# Patient Record
Sex: Female | Born: 1950
Health system: Southern US, Community
[De-identification: ages and names within clinical notes are randomized; demographics above are authoritative.]

## PROBLEM LIST (undated history)

## (undated) DIAGNOSIS — G500 Trigeminal neuralgia: Secondary | ICD-10-CM

## (undated) DIAGNOSIS — G35 Multiple sclerosis: Secondary | ICD-10-CM

## (undated) DIAGNOSIS — I10 Essential (primary) hypertension: Secondary | ICD-10-CM

## (undated) DIAGNOSIS — E079 Disorder of thyroid, unspecified: Secondary | ICD-10-CM

## (undated) DIAGNOSIS — M8580 Other specified disorders of bone density and structure, unspecified site: Secondary | ICD-10-CM

## (undated) HISTORY — DX: Other specified disorders of bone density and structure, unspecified site: M85.80

## (undated) HISTORY — DX: Disorder of thyroid, unspecified: E07.9

## (undated) HISTORY — DX: Essential (primary) hypertension: I10

## (undated) HISTORY — DX: Multiple sclerosis: G35

## (undated) HISTORY — PX: OTHER SURGICAL HISTORY: SHX169

## (undated) HISTORY — DX: Trigeminal neuralgia: G50.0

---

## 1986-10-06 HISTORY — PX: OTHER SURGICAL HISTORY: SHX169

## 1988-10-06 HISTORY — PX: OTHER SURGICAL HISTORY: SHX169

## 1997-10-06 HISTORY — PX: ELBOW SURGERY: SHX618

## 1998-10-02 ENCOUNTER — Inpatient Hospital Stay (HOSPITAL_COMMUNITY): Admission: EM | Admit: 1998-10-02 | Discharge: 1998-10-08 | Payer: Self-pay | Admitting: Orthopedic Surgery

## 1998-10-02 ENCOUNTER — Encounter: Payer: Self-pay | Admitting: Orthopedic Surgery

## 1998-10-03 ENCOUNTER — Encounter: Payer: Self-pay | Admitting: Orthopedic Surgery

## 1998-10-04 ENCOUNTER — Encounter: Payer: Self-pay | Admitting: Orthopedic Surgery

## 1998-10-08 ENCOUNTER — Inpatient Hospital Stay (HOSPITAL_COMMUNITY)
Admission: RE | Admit: 1998-10-08 | Discharge: 1998-10-12 | Payer: Self-pay | Admitting: Physical Medicine and Rehabilitation

## 1999-05-24 ENCOUNTER — Other Ambulatory Visit: Admission: RE | Admit: 1999-05-24 | Discharge: 1999-05-24 | Payer: Self-pay | Admitting: Obstetrics and Gynecology

## 2000-05-26 ENCOUNTER — Other Ambulatory Visit: Admission: RE | Admit: 2000-05-26 | Discharge: 2000-05-26 | Payer: Self-pay | Admitting: Obstetrics and Gynecology

## 2001-03-05 ENCOUNTER — Observation Stay (HOSPITAL_COMMUNITY): Admission: RE | Admit: 2001-03-05 | Discharge: 2001-03-05 | Payer: Self-pay | Admitting: Neurosurgery

## 2001-03-05 ENCOUNTER — Encounter: Payer: Self-pay | Admitting: Neurosurgery

## 2001-05-17 ENCOUNTER — Other Ambulatory Visit: Admission: RE | Admit: 2001-05-17 | Discharge: 2001-05-17 | Payer: Self-pay | Admitting: Obstetrics and Gynecology

## 2002-06-21 ENCOUNTER — Other Ambulatory Visit: Admission: RE | Admit: 2002-06-21 | Discharge: 2002-06-21 | Payer: Self-pay | Admitting: *Deleted

## 2003-06-08 ENCOUNTER — Observation Stay (HOSPITAL_COMMUNITY): Admission: EM | Admit: 2003-06-08 | Discharge: 2003-06-09 | Payer: Self-pay | Admitting: Neurology

## 2003-06-09 ENCOUNTER — Encounter: Payer: Self-pay | Admitting: Pediatrics

## 2003-07-11 ENCOUNTER — Other Ambulatory Visit: Admission: RE | Admit: 2003-07-11 | Discharge: 2003-07-11 | Payer: Self-pay | Admitting: *Deleted

## 2003-10-07 HISTORY — PX: OTHER SURGICAL HISTORY: SHX169

## 2004-02-13 ENCOUNTER — Inpatient Hospital Stay (HOSPITAL_COMMUNITY): Admission: EM | Admit: 2004-02-13 | Discharge: 2004-02-15 | Payer: Self-pay | Admitting: Emergency Medicine

## 2004-02-19 ENCOUNTER — Encounter: Admission: RE | Admit: 2004-02-19 | Discharge: 2004-02-19 | Payer: Self-pay | Admitting: Family Medicine

## 2005-02-10 ENCOUNTER — Emergency Department (HOSPITAL_COMMUNITY): Admission: EM | Admit: 2005-02-10 | Discharge: 2005-02-10 | Payer: Self-pay | Admitting: Emergency Medicine

## 2005-02-18 ENCOUNTER — Emergency Department (HOSPITAL_COMMUNITY): Admission: EM | Admit: 2005-02-18 | Discharge: 2005-02-18 | Payer: Self-pay | Admitting: Family Medicine

## 2005-08-13 ENCOUNTER — Ambulatory Visit: Payer: Self-pay | Admitting: Gastroenterology

## 2005-08-22 ENCOUNTER — Ambulatory Visit: Payer: Self-pay | Admitting: Gastroenterology

## 2005-10-06 ENCOUNTER — Emergency Department (HOSPITAL_COMMUNITY): Admission: EM | Admit: 2005-10-06 | Discharge: 2005-10-06 | Payer: Self-pay | Admitting: Emergency Medicine

## 2006-03-17 ENCOUNTER — Other Ambulatory Visit: Admission: RE | Admit: 2006-03-17 | Discharge: 2006-03-17 | Payer: Self-pay | Admitting: Gynecology

## 2007-03-19 ENCOUNTER — Other Ambulatory Visit: Admission: RE | Admit: 2007-03-19 | Discharge: 2007-03-19 | Payer: Self-pay | Admitting: Gynecology

## 2008-04-03 ENCOUNTER — Other Ambulatory Visit: Admission: RE | Admit: 2008-04-03 | Discharge: 2008-04-03 | Payer: Self-pay | Admitting: Gynecology

## 2009-07-16 ENCOUNTER — Ambulatory Visit: Payer: Self-pay | Admitting: Gynecology

## 2009-07-16 ENCOUNTER — Encounter: Payer: Self-pay | Admitting: Gynecology

## 2009-07-16 ENCOUNTER — Other Ambulatory Visit: Admission: RE | Admit: 2009-07-16 | Discharge: 2009-07-16 | Payer: Self-pay | Admitting: Gynecology

## 2009-10-06 HISTORY — PX: BACK SURGERY: SHX140

## 2009-11-27 ENCOUNTER — Inpatient Hospital Stay (HOSPITAL_COMMUNITY): Admission: RE | Admit: 2009-11-27 | Discharge: 2009-12-02 | Payer: Self-pay | Admitting: Neurosurgery

## 2010-07-22 ENCOUNTER — Ambulatory Visit: Payer: Self-pay | Admitting: Gynecology

## 2010-07-22 ENCOUNTER — Other Ambulatory Visit: Admission: RE | Admit: 2010-07-22 | Discharge: 2010-07-22 | Payer: Self-pay | Admitting: Gynecology

## 2010-08-26 ENCOUNTER — Ambulatory Visit: Payer: Self-pay | Admitting: Gynecology

## 2010-09-26 ENCOUNTER — Ambulatory Visit: Payer: Self-pay | Admitting: Gynecology

## 2010-12-25 LAB — SURGICAL PCR SCREEN: Staphylococcus aureus: NEGATIVE

## 2010-12-25 LAB — TYPE AND SCREEN: ABO/RH(D): A NEG

## 2010-12-25 LAB — CBC
HCT: 34.8 % — ABNORMAL LOW (ref 36.0–46.0)
Hemoglobin: 12 g/dL (ref 12.0–15.0)
MCHC: 34.6 g/dL (ref 30.0–36.0)
RDW: 12.7 % (ref 11.5–15.5)

## 2010-12-25 LAB — CARBAMAZEPINE LEVEL, TOTAL
Carbamazepine Lvl: 12 ug/mL (ref 4.0–12.0)
Carbamazepine Lvl: 19.3 ug/mL (ref 4.0–12.0)

## 2010-12-25 LAB — BASIC METABOLIC PANEL
BUN: 16 mg/dL (ref 6–23)
CO2: 25 mEq/L (ref 19–32)
GFR calc non Af Amer: >60 mL/min (ref >60)
Glucose, Bld: 93 mg/dL (ref 70–99)
Sodium: 138 mEq/L (ref 135–145)

## 2010-12-25 LAB — ABO/RH: ABO/RH(D): A NEG

## 2011-01-06 ENCOUNTER — Ambulatory Visit (INDEPENDENT_AMBULATORY_CARE_PROVIDER_SITE_OTHER): Payer: BC Managed Care – PPO | Admitting: Gynecology

## 2011-01-06 DIAGNOSIS — R82998 Other abnormal findings in urine: Secondary | ICD-10-CM

## 2011-01-06 DIAGNOSIS — B373 Candidiasis of vulva and vagina: Secondary | ICD-10-CM

## 2011-01-06 DIAGNOSIS — N898 Other specified noninflammatory disorders of vagina: Secondary | ICD-10-CM

## 2011-01-06 DIAGNOSIS — N952 Postmenopausal atrophic vaginitis: Secondary | ICD-10-CM

## 2011-02-21 NOTE — Discharge Summary (Signed)
NAME:  Melanie Richardson, Melanie Richardson                        ACCOUNT NO.:  [id number]   MEDICAL RECORD NO.:  [id number]                   PATIENT TYPE:  INP   LOCATION:  4705                                 FACILITY:  [facility]   PHYSICIAN:  Asencion Partridge, M.D.                  DATE OF BIRTH:  07-26-1951   DATE OF ADMISSION:  02/13/2004  DATE OF DISCHARGE:  02/15/2004                                 DISCHARGE SUMMARY   DISCHARGE DIAGNOSES:  1. Tegretol toxicity.  2. Hypertension.  3. Multiple sclerosis.  4. Trigeminal neuralgia.  5. Abnormal EKG.   DISCHARGE MEDICATIONS:  1. Metoprolol 50 mg b.i.d.  2. Tegretol 200 mg 4 times daily.  3. Prednisone 10 mg 4 times daily.  4. Amitriptyline 150 mg nightly.  5. Temazepam 15 mg nightly p.r.n.  6. Teveten 60 mg daily.  7. Lamictal 100 mg 1/2 tablet b.i.d.   The patient was instructed to resume any other home medications and to  discontinue use of Cardizem.   DISPOSITION:  Patient discharged to home at baseline status.   FOLLOWUP:  The patient has an appointment, Feb 23, 2004 at 11:40 a.m., at  [facility] and Feb 26, 2004 with Dr. Harriette Bouillon.   BRIEF ADMISSION HISTORY:  This is a 60 year old female with a 27-year  history of multiple sclerosis and 20-year history of trigeminal neuralgia  for which she takes Tegretol 200 mg 4 times daily, who presented with a 3-  day history of decreasing mental status, ataxia, constipation and urinary  retention.  The patient had been compliant with Tegretol during this time.  Cardizem LA 100 mg daily was added 5 weeks ago for a new diagnosis of  hypertension.  The patient only uses amitriptyline as needed at night.  She  has no history of similar symptoms.  The patient denied abdominal pain,  nausea, vomiting, rash, temperature, chills or sweats.  The patient had  frequent falls secondary to ataxia during the past few days, no loss of  consciousness.   PHYSICAL EXAMINATION:  ADMISSION  VITALS:  Temperature 97.5, heart rate of  100, blood pressure 152/94, saturating 98% on room air.  GENERAL:  Physical exam was pertinent for a patient who was dizzy, holding  onto the bed rail but alert.  She was tachycardic, otherwise, unremarkable.   ADMISSION LABORATORIES:  Tegretol level 30.7, white blood cell count 9.1,  hemoglobin 12.9, hematocrit 37.9, platelets of 226,000.  I-STAT:  Sodium  136, potassium 3.7, chloride of 103, bicarb of 25, BUN of 15, creatinine of  0.8, glucose of 100, total bilirubin 0.5, alkaline phosphatase 48, AST 25,  ALT 30, total protein 5.8, albumin 3.2.  Ammonia level 18.  Urinalysis was  negative except for positive leukocyte esterase with a few bacteria, 3-6  wbc's.  Urine drug screen was positive for benzodiazepines.   EKG shows a right axis deviation consistent with right  ventricular  hypertrophy as well as left ventricular hypertrophy, right atrium  enlargement with approximately 1-mm ST depression in V2 to V4.   Head CT showed mild diffuse cerebral atrophy and moderate cerebellar  atrophy, old bifrontal and left frontoparietal white matter lacunar infarcts  with no acute abnormality.   HOSPITAL COURSE:  PROBLEM #1 - TEGRETOL TOXICITY:  Poison Control was  contacted and on reviewing the patient's medications, the most likely  possibility for interaction was with the Cardizem.  Considering that this  had been a chronic buildup in level, no acute actions to decontaminate were  done.  The patient was placed on her multiple sclerosis medication.  Tegretol was held as well as the Cardizem.  She was placed on maintenance IV  fluids.  Foley catheter was placed with the history of urinary retention.  Overnight, the patient did well.  She woke up more alert.  Tegretol level  had gone down to 18.6.  It was decided to continue to hold and restart the  Tegretol the next morning, at what time her Tegretol level was 12.1 and she  was restarted on her regular  dosing schedule.  Foley catheter was removed  the day after admission; the patient had spontaneous voiding.  She was in-  and-out catheterized after her initial void with 0 residual.  The patient  was felt to be at her baseline prior to discharge and that the Tegretol  toxicity was again of a chronic nature secondary to the combination of  Tegretol and Cardizem.   PROBLEM #2 - HYPERTENSION:  Considering that the Cardizem was stopped, the  patient's blood pressures were monitored and began to elevate with the range  of 160-180/100-110.  The patient was placed on metoprolol 50 mg b.i.d.,  after being checked with the pharmacy to ensure that there was not an  interaction.  The patient's blood pressures the day of discharge had  stabilized and were 120s to 140s over 80s to 90s, felt to be safe for  discharge.   PROBLEM #3 - ABNORMAL EKG:  Again, the patient's initial EKG on presentation  had a questionable infarct with 1-mm ST depression in leads V2 to 4 as well  as biventricular hypertrophy.  Considering the patient was tachycardic, was  wondering if she had any cardiac strain.  Cardiac enzymes were cycled and  EKG was repeated in the morning.  Cardiac enzymes were within normal limits;  first set -- CK 32, CK-MB 2.9, troponin 0.02; second set -- CK 31, CK-MB  2.8, troponin 0.03; third set -- CK 52, CK-MB 3.2, troponin 0.04.  The  patient's repeat EKG was improved, ST depression was no longer visible,  however, biventricular hypertrophy was still notable on EKG.  The patient  was monitored on telemetry and on the night of the 11th, the patient had a  run of atrial flutter; a copy of this strip will be faxed to [facility].  The patient likely needs an outpatient echocardiogram for  further workup.   PROBLEM #4 - Other labs obtained during her hospitalization in workup of  Tegretol toxicity:  TSH 1.102.  Iron level was 90.  CMET at discharge showed a sodium of 137,  potassium of 3.1, chloride of 107, CO2 of 23, glucose of  130, BUN of 13, creatinine of 0.8, total bilirubin of 0.5, alkaline  phosphatase of 50, AST of 28, ALT of 30, total protein 5.8, albumin 3.1 and  calcium 8.7.  The patient was  given a total of 80 mEq the day of discharge  of  potassium chloride.      Anastasio Auerbach, MD                          Asencion Partridge, M.D.    AD/MEDQ  D:  02/15/2004  T:  02/16/2004  Job:  [id number]   cc:   [facility]   [facility]  [place]  [place] [address]  Fax: [phone]

## 2011-02-21 NOTE — Discharge Summary (Signed)
NAME:  Melanie, Richardson                        ACCOUNT NO.:  [id number]   MEDICAL RECORD NO.:  [id number]                   PATIENT TYPE:  INP   LOCATION:  3040                                 FACILITY:  [facility]   PHYSICIAN:  Marlan Palau, M.D.               DATE OF BIRTH:  May 19, 1951   DATE OF ADMISSION:  06/08/2003  DATE OF DISCHARGE:                                 DISCHARGE SUMMARY   ADMISSION DIAGNOSES:  1. New onset seizure.  2. Multiple sclerosis.  3. Trigeminal neuralgia on the right.   DISCHARGE DIAGNOSES:  1. New onset seizure, likely a symptomatic seizure.  2. History of multiple sclerosis.  3. Trigeminal neuralgia on the right.  4. History of depression.   PROCEDURES THIS ADMISSION:  1. EEG study.  2. MRI scan of the brain.   COMPLICATIONS ABOVE PROCEDURES:  None.   HISTORY OF PRESENT ILLNESS:  Melanie Richardson is a 60 year old white  female, born [birth year], with a history of multiple sclerosis, trigeminal  neuralgia.  The patient is followed though [facility] for  her ongoing pain.  The patient has not responded to a gamma knife procedure  for her trigeminal neuralgia.  The patient has been treated with Wellbutrin  and Ultram, Tegretol, amitriptyline prior to coming in.  This patient  suffered a generalized seizure that was witnessed at home with generalized  jerking.  The patient had no warning of the event.  The patient did have  tongue biting.  The patient went to a local emergency room and was sent down  to this hospital for further evaluation.   PAST MEDICAL HISTORY:  1. History of multiple sclerosis.  2. Trigeminal neuralgia on the right.  3. New onset seizure.   MEDICATIONS PRIOR TO ADMISSION:  1. Tegretol XR 400 mg, one four times a day.  2. Prednisone 60 mg a day.  3. Birth control pills.  4. Ultram 50 mg four times a day.  5. Wellbutrin SR 150 mg b.i.d.  6. Risperdal 30 mg at night for sleep.  7. Elavil 150 mg a  night.   Patient has intolerance to NEURONTIN and BACLOFEN.   Does not smoke but uses nicotine gum on a regular basis at this point.  The  patient does not drink on a regular basis.   Please refer to history and physical dictation summary for social history,  family history, review of systems, physical examination.   LABORATORY VALUES:  Not available from this admission.  The patient did have  a Tegretol level done prior to this admission showing a level of 18.   The patient did have a CT scan of the head that shows a deep white matter  lesion in the right frontal area.  No acute changes were seen.   HOSPITAL COURSE:  The patient was admitted to [facility], underwent  evaluation for her seizure event.  The patient has undergone an MRI scan of  the brain showing evidence of multiple periventricular white matter lesions,  a right pontine lesion as well.  This is going to be compared to a triad  imaging study done previously but this has not yet been done.   EEG study has been done this morning and reveals no abnormalities.   It is felt that the patient likely had a symptomatic seizure associated  with the use of Wellbutrin, Ultram, amitriptyline.  Ultram and the  Wellbutrin will be discontinued.  The patient will be kept on her Tegretol  at 400 mg one four times a day.  Patient generally runs in the low toxic  range 12-13 level and seems to tolerate the medication fine.  The patient  has experienced no ataxia prior to this admission.   PLAN:  Discharge this patient on Tegretol 400 mg XR tablets, one four times  a day, Elavil 150 mg at night, prednisone 20 mg reduced to two daily.  The  patient may continue the birth control.  She is to go off of Ultram and  Wellbutrin.  Will followup with [facility] in about three  weeks.  May consider a referral for neurosurgery to consider a procedure for  her trigeminal neuralgia.                                                 Marlan Palau, M.D.    CKW/MEDQ  D:  06/09/2003  T:  06/10/2003  Job:  [id number]   cc:   [facility]  [address]  [address]

## 2011-02-21 NOTE — H&P (Signed)
NAME:  Melanie Richardson, Melanie Richardson                        ACCOUNT NO.:  [id number]   MEDICAL RECORD NO.:  [id number]                   PATIENT TYPE:  INP   LOCATION:  1832                                 FACILITY:  [facility]   PHYSICIAN:  Barney Drain, M.D.                 DATE OF BIRTH:  1951/07/21   DATE OF ADMISSION:  02/13/2004  DATE OF DISCHARGE:                                HISTORY & PHYSICAL   SERVICE:  Conservation officer, historic buildings.   CHIEF COMPLAINT:  Combative and can not walk x 3 days.   HISTORY OF PRESENT ILLNESS:  A 60 year old female with a 27 year history of  multiple sclerosis and a 20 year history of trigeminal neuralgia for which  she takes Tegretol 400 mg q.i.d. who presents with a three day history of  decreasing mental status, ataxia, constipation, and urinary retention.  She  has been compliant with Tegretol during this time.  Cardizem LA 180 mg daily  was added five weeks ago for a new diagnosis of hypertension.  The patient  only uses amitriptyline as needed at night.  She has no history of similar  symptoms.  Denies abdominal pain, nausea or vomiting, rash, temperatures,  chills, or sweats.  She has had frequent falls secondary to ataxia during  the past few days.  No loss of consciousness.  She had one alcoholic  beverage on Feb 10, 2004.  She only drinks alcohol rarely.  She had blood  work done Feb 04, 2004, at [facility] but did not hear about test  results, so assumed that they were negative.  Unsure of what she was tested  for, presumably a Tegretol level.  The patient states that her gait has been  less steady for the past two months.   PAST MEDICAL HISTORY:  1. Multiple sclerosis x 27 years.  2. Hypertension, diagnosed five weeks ago.  3. Trigeminal neuralgia secondary to multiple sclerosis x 20 years.   MEDICATIONS:  1. Prednisone 10 mg q.i.d.  2. Amitriptyline 150 mg q.h.s. p.r.n.  3. Tegretol 400 mg q.i.d. and no recent change in dose.  4.  Temazepam 15 mg q.h.s. p.r.n.  5. Rediss IM injection three times a week.  6. Ortho-Novum 1/35 daily.  7. Cardizem 180 mg LA every day.  8. Teveten 600 mg daily.  9. Lamictal 50 mg b.i.d.  10.      Multivitamin.   No known drug allergies.   FAMILY HISTORY:  Parents with hypertension.  Father had colon cancer.  No  coronary artery disease or strokes.  No diabetes.   SOCIAL HISTORY:  The patient lives with her husband.  She has a 30 pack year  smoking history, has not smoked for 15 years.  She does, however, chew at  least 12 pieces of Nicorette gum a day.  She has a rare glass of wine.  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  She  has been unable to work, however, for the past two months secondary to a  decrease in her strength.   REVIEW OF SYSTEMS:  Last menstrual period was January 1991 which was  nonsurgical menopause.  She is on hormone replacement therapy.  She denies  any weight change.  She was treated for a urinary tract infection two weeks  prior to admission and completed the antibiotic course as prescribed.   PHYSICAL EXAMINATION:  GENERAL:  Stating that she is dizzy, holding onto the  bed rail on her side.  Alert.  VITAL SIGNS:  Temperature 97.5, heart rate 100, blood pressure 152/94, O2  98% on room air.  HEENT:  Normocephalic, atraumatic.  Mucous membranes are tacky.  Oropharynx  was clear.  Dentition is good.  NECK:  No thyromegaly.  No masses.  No lymphadenopathy appreciated.  CARDIOVASCULAR:  Normal precordium.  S1 S2.  Tachypneic.  No ectopy.  Pulses  2/2 in all four extremities, symmetric.  LUNGS:  Good air exchange.  Clear to auscultation bilaterally without  crackle, wheeze, or rhonchi.  ABDOMEN:  Decreased bowel sounds, nondistended.  Soft, nontender.  No  hepatosplenomegaly.  No masses.  MUSCULOSKELETAL:  Strength is 5/5 globally.  No erythema, edema, or  __________  .  No tenderness.  NEUROLOGIC:  Unable to assess her  nystagmus.  Pupils equal, round and  reactive to light.  Sensation intact to light touch.  Mentation is intact.  SKIN:  Multiple small abrasions upper extremities.  No rash.  No ecchymosis.   LABS:  Carbamazepine level 30.7 mcg/ml.  White count 9.1, hemoglobin 12.9,  hematocrit 37.9, platelets 226, PMN 85%, ENC 7.7.  I-STAT lytes:  Sodium  136, potassium 3.7, chloride 103, bicarb 25, BUN 15, creatinine 0.8, glucose  100.  Total bili 0.5, direct bili 0.2, indirect bili 0.3, alk phos 48, AST  25, ALT 30, total protein 5.8, albumin 3.2.  Ammonia 19.  Urine:  Amber,  hazy, specific gravity 1.020, nitrate negative, leukocyte esterase positive.  Urine micro a few epithelial cells, 3-6 white blood cells, few bacteria.  UDS is positive for benzodiazepines.   ECG is sinus tachycardia at 103 beats per minute, right axis deviation,  right atrial enlargement, right ventricle hypertrophy and left ventricle  hypertrophy but by voltage criteria.  QTC equals 500 milliseconds.  There is  ST depression of 1-mm in leads V2-V4.   Head CT:  Cerebellar greater than cerebral atrophy, multiple white matter  lacunar infarcts.   ASSESSMENT/PLAN:  A 60 year old female with Tegretol toxicity and  hypertension.  1. Tegretol toxicity.  Decrease metabolism possibly due to recent addition     of Cardizem.  Onset of her increased gait instability and initiation of     the Cardizem coincide.  Tegretol toxicity explains many of her current     symptoms including dizziness, ataxia, urinary retention, and     constipation.  We will place on seizure precautions.  Keep her on sips of     clear liquids.  We will check for common side effects of Tegretol     including a TSH, iron, and CBC.  We will keep on telemetry to monitor QT     duration.  We will hold Tegretol.  Follow liver function and sodium. 2. Abnormal electrocardiogram with 1-mm ST depression in septal and lateral     leads is concerning.  We will need to compare  to old studies,  check three     sets of cardiac markers, check portable chest x-ray, and check a 12-lead     electrocardiogram in the morning.  No history of coronary artery disease     but with a new diagnosis of hypertension and lacunar infarcts that were     noted on the computed tomography these findings are consistent with small     blood vessel disease which may increase her risk.  3. Tachycardia.  We will observe on telemetry.  4. Urinary retention.  __________  Foley in place consistent with number     one.  Hold amitriptyline.  5. Dark urine, recently treated for a urinary tract infection.  Denies     dysuria or urinary frequency prior to three days ago.  With confinement     to bed and multiple falls over the last three days, rhabdomyolysis is a     consideration; therefore, we will check a total creatinine kinase.  Of     note, her creatinine is 0.8 which is within normal limits.  6. Constipation likely secondary to number one.  We will provide Dulcolax     suppository.  7. White matter lacunar infarct, very suggestive of small blood vessel     disease.  It appears stable.  Ensure that the patient has had adequate     risk factor stratification.  8. Multiple sclerosis.  Cerebellar and cerebral atrophy noted on computed     tomography.  The patient is functioning, despite the duration of her     disease.  We will continue on prednisone 10 mg q.i.d. and continue Rediss     three times a week.  9. Trigeminal neuralgia.  We will  hold Lamictal and Tegretol.  We will     discuss with neuro appropriate regimen she will be able to resume.  These     medications, once the calcium channel blocker has been discontinued, her     Tegretol level has fallen.  10.      Hypertension.  Will hold the Cardizem and Teveten.  She should be     able to resume Teveten in the morning.  We will consider a beta-blocker     as there is no contraindication with the Tegretol.  11.      Fluids,  electrolytes, nutrition.  Maintenance intravenous fluids     with potassium chloride.  Provide sips of clear liquids.  When less     dizzy, will advance diet.  We will provide hydroxyzine for nausea and     dizziness.  We will avoid Phenergan as interacts with Tegretol.                                                Barney Drain, M.D.    SS/MEDQ  D:  02/13/2004  T:  02/13/2004  Job:  [id number]   cc:   Marlan Palau, M.D.  [address]  [address]  [place]  [place] [address]  Fax: [phone]

## 2011-02-21 NOTE — Op Note (Signed)
[place]  Patient:    Melanie Richardson, Melanie Richardson                     MRN: [id number] Proc. Date: 03/05/01 Adm. Date:  [id number] Disc. Date: [id number] Attending:  Josie Saunders                           Operative Report  PREOPERATIVE DIAGNOSIS:  Right V3 distribution trigeminal neuralgia.  POSTOPERATIVE DIAGNOSIS:  Right V3 distribution trigeminal neuralgia.  OPERATION PERFORMED:  Right V3 radiofrequency retrogasserian rhizolysis.  SURGEON:  Danae Orleans. Venetia Maxon, M.D.  ANESTHESIA:  ____________ with local lidocaine.  ESTIMATED BLOOD LOSS:  Minimal.  COMPLICATIONS:  None.  DISPOSITION:  Recovery.  INDICATIONS FOR PROCEDURE:  The patient is a 60 year old woman with multiple sclerosis with severe intractable right facial pain in a V3 distribution.  It was elected to take her to surgery for a percutaneous retrogasserian radiofrequency rhizolysis V3 trigeminal nerve.  DESCRIPTION OF PROCEDURE:  The patient was brought to the operating room.  She was given fentanyl and Versed preoperatively.  She was placed in a supine position on the operating table.  Her neck was placed in slight extension and an AP basal skull x-ray view with fluoroscopy unit was obtained demonstrating the right foramen ovale.  The area 2.5 cm lateral to the angle of her mouth was then infiltrated with 1% local lidocaine without epinephrine and deeper buccal tissues were infiltrated with similar local anesthetic.  A gloved hand was kept in the mouth during the procedure to protect against violation of the buccal mucosa.  The disposable 5 mm radiofrequency electrode cannula was then placed into the center of the medial half of the foramen ovale. This was identified on fluoroscopic visualization and then lateral fluoroscopy was used to confirm depth of needle placement.  There was no egress of CSF.  The electrode was placed and at approximately 5 mm below the clivus the  electrical stimulation was performed and the patient experienced her typical facial pain at a voltage of .14 volts.  The patient experienced this pain in the usual distribution.  This stimulation was performed at 50 hz.  2 hz stimulation was then performed to assess for masseter contraction and this was not seen above 4.4 volts.  The patient was then induced with Propofol anesthetic and the initial radiofrequency lesion was created at 70 degrees centigrade for 60 seconds.  The patient was then awakened and assessed.  She had some mild V3 distribution hypalgesia to pinprick with intact sensation in the V1 and V2 distribution.  She had a facial flush in the V2 and V3 distribution with this lesion.  The cannula was then withdrawn approximately 3 mm and additional stimulation was performed and the patient again experienced her typical location of facial pain at approximately 0.39 volts.  It was therefore elected to place a second lesion and the patient was once again induced with Propofol anesthesia and a second lesion was created at 75 degrees centigrade from 90 seconds.  Following this, the cannula and electrode were removed.  The patient was taken to recovery in stable and satisfactory condition having tolerated the procedure well.  Facial sensation was assessed in the recovery room.  She had some V3 distribution numbness and no significant V2 distribution numbness. She had intact extraocular movements and corneal reflex.  The patient was observed in recovery and then sent home. DD:  03/05/01 TD:  03/05/01 Job: [id number]

## 2011-04-28 ENCOUNTER — Telehealth: Payer: Self-pay | Admitting: *Deleted

## 2011-04-28 DIAGNOSIS — N390 Urinary tract infection, site not specified: Secondary | ICD-10-CM

## 2011-04-28 MED ORDER — NITROFURANTOIN MONOHYD MACRO 100 MG PO CAPS: 100.0000 mg | ORAL_CAPSULE | Freq: Two times a day (BID) | ORAL | Status: AC

## 2011-04-28 NOTE — Telephone Encounter (Signed)
CHART ON YOUR DESK

## 2011-04-28 NOTE — Telephone Encounter (Signed)
PT CALLED C/O UTI S/S STARTING TODAY FREQUENT URINATION, SLIGHT BURNING. O.V. OFFERED TO PT. PT STATES SHE LIVES AWAY AND WOULD LIKE RX PLEASE ADVISE.

## 2011-04-28 NOTE — Telephone Encounter (Signed)
PT INFORMED OF RX.  AND TO FOLLOW UP IF SYMPTOMS PERSIST OR RECUR. RX SENT TO [facility]

## 2011-04-28 NOTE — Telephone Encounter (Signed)
Call patient for pharmacy information and call in Macrobid 100 mg twice a day x7 days #14 followup if symptoms persist or recur

## 2011-06-03 ENCOUNTER — Other Ambulatory Visit: Payer: Self-pay | Admitting: Gynecology

## 2011-06-03 DIAGNOSIS — R519 Headache, unspecified: Secondary | ICD-10-CM

## 2011-08-13 ENCOUNTER — Other Ambulatory Visit: Payer: Self-pay | Admitting: Gynecology

## 2011-11-05 ENCOUNTER — Other Ambulatory Visit: Payer: Self-pay | Admitting: Gynecology

## 2012-01-13 ENCOUNTER — Encounter: Payer: Self-pay | Admitting: Gynecology

## 2012-01-15 ENCOUNTER — Encounter: Payer: Self-pay | Admitting: *Deleted

## 2012-01-15 DIAGNOSIS — G35 Multiple sclerosis: Secondary | ICD-10-CM | POA: Insufficient documentation

## 2012-01-15 DIAGNOSIS — G500 Trigeminal neuralgia: Secondary | ICD-10-CM | POA: Insufficient documentation

## 2012-01-20 ENCOUNTER — Encounter: Payer: BC Managed Care – PPO | Admitting: Gynecology

## 2012-02-13 ENCOUNTER — Other Ambulatory Visit: Payer: Self-pay | Admitting: Gynecology

## 2012-03-11 ENCOUNTER — Ambulatory Visit (INDEPENDENT_AMBULATORY_CARE_PROVIDER_SITE_OTHER): Payer: BC Managed Care – PPO | Admitting: Gynecology

## 2012-03-11 ENCOUNTER — Encounter: Payer: Self-pay | Admitting: Gynecology

## 2012-03-11 VITALS — BP 132/86 | Ht 65.5 in | Wt 139.0 lb

## 2012-03-11 DIAGNOSIS — M8580 Other specified disorders of bone density and structure, unspecified site: Secondary | ICD-10-CM

## 2012-03-11 DIAGNOSIS — M899 Disorder of bone, unspecified: Secondary | ICD-10-CM

## 2012-03-11 DIAGNOSIS — Z01419 Encounter for gynecological examination (general) (routine) without abnormal findings: Secondary | ICD-10-CM

## 2012-03-11 DIAGNOSIS — N952 Postmenopausal atrophic vaginitis: Secondary | ICD-10-CM

## 2012-03-11 NOTE — Patient Instructions (Signed)
Followup for bone density as scheduled. 

## 2012-03-11 NOTE — Progress Notes (Signed)
Melanie Richardson 05-19-1951 [id number]        61 y.o.  for annual exam.  Several issues noted below.  Past medical history,surgical history, medications, allergies, family history and social history were all reviewed and documented in the EPIC chart. ROS:  Was performed and pertinent positives and negatives are included in the history.  Exam: Kim chaperone present Filed Vitals:   03/11/12 1425  BP: 132/86   General appearance  Normal Skin grossly normal Head/Neck normal with no cervical or supraclavicular adenopathy thyroid normal Lungs  clear Cardiac RR, without RMG Abdominal  soft, nontender, without masses, organomegaly or hernia Breasts  examined lying and sitting without masses, retractions, discharge or axillary adenopathy. Pelvic  Ext/BUS/vagina  normal with atrophic genital changes  Cervix  normal with atrophic changes  Uterus  axial, normal size, shape and contour, midline and mobile nontender   Adnexa  Without masses or tenderness    Anus and perineum  normal   Rectovaginal  normal sphincter tone without palpated masses or tenderness.    Assessment/Plan:  61 y.o. female for annual exam.    1. Postmenopausal. Patient doing well without significant hot flushes night sweats or other symptoms. We'll continue to monitor at present. 2. Osteopenia. Patient has a history of low bone mass with DEXA 2010 showing a T score of -2. She does have a history of fractures the prevertebral but it sounds that these were trauma related as she fell down flights of stairs due to a seizure disorder. Her other doctors had discussed whether to start her on bisphosphonates or Forteo but had never done this. I ordered another DEXA now we'll get another baseline. I think if it's stable with osteopenia we'll continue to monitor as again her fracture some more trauma related. We'll further discuss pending her DEXA results. Increase calcium vitamin D have been discussed.  Apparently she had some issues with  hypercalcemia and they took her off her calcium. 3. Mammography. She had her mammogram months ago. She'll continue with annual mammography is. SBE monthly reviewed. 4. Pap smear. She has no history of abnormal Pap smears with numerous normal records in her chart and last Pap smear 2011. No Pap smear was done today. I discussed current screening guidelines and will plan on every 3-5 years screening. 5. Colonoscopy. She's never had a colonoscopy and I again strongly encouraged her to schedule this. She understands the benefits of early detection and agrees to schedule this. 6. Health maintenance. No blood work was done today as is all done through her primary physician's office who actively sees her.    [name], 3:33 PM 03/11/2012

## 2012-05-16 ENCOUNTER — Other Ambulatory Visit: Payer: Self-pay | Admitting: Gynecology

## 2012-06-09 ENCOUNTER — Encounter: Payer: Self-pay | Admitting: Gynecology

## 2012-06-09 ENCOUNTER — Ambulatory Visit (INDEPENDENT_AMBULATORY_CARE_PROVIDER_SITE_OTHER): Payer: BC Managed Care – PPO | Admitting: Gynecology

## 2012-06-09 DIAGNOSIS — N898 Other specified noninflammatory disorders of vagina: Secondary | ICD-10-CM

## 2012-06-09 DIAGNOSIS — Z113 Encounter for screening for infections with a predominantly sexual mode of transmission: Secondary | ICD-10-CM

## 2012-06-09 LAB — URINALYSIS W MICROSCOPIC + REFLEX CULTURE
Bilirubin Urine: NEGATIVE
Glucose, UA: NEGATIVE mg/dL
Hgb urine dipstick: NEGATIVE
Leukocytes, UA: NEGATIVE
pH: 8.5 — ABNORMAL HIGH (ref 5.0–8.0)

## 2012-06-09 MED ORDER — FLUCONAZOLE 150 MG PO TABS: 150.0000 mg | ORAL_TABLET | Freq: Once | ORAL | Status: AC

## 2012-06-09 NOTE — Progress Notes (Signed)
Patient presents with several days of vaginal itching and a little bit of discharge. Questions whether her husband is having an extramarital affair. Requests STD screening.  Past medical history,surgical history, medications, allergies, family history and social history were all reviewed and documented in the EPIC chart. ROS:  Was performed and pertinent positives and negatives are included in the history.  Exam was Sherrilyn Rist Asst. Abdomen soft nontender without masses guarding rebound organomegaly. Pelvic external BUS vagina with atrophic changes. No discharge noted. Cervix normal. Uterus normal size midline mobile nontender. Adnexa without masses or tenderness.  Assessment and plan: 1. Complaints of vaginal itching. Exam is overall normal. Wet prep negative. Will cover with Diflucan 150x1 dose. Follow up if symptoms persist or recur. 2. STD screening. GC/Chlamydia, hepatitis B, hepatitis C, RPR, HIV done. I reviewed HSV and HPV issues and she'll present if she notices any lesions. She does have an appointment to talk to a marriage counselor.

## 2012-06-09 NOTE — Patient Instructions (Addendum)
Take Diflucan pill. Follow up if itching or discharge persists or recurs. The office will call you with laboratory results in several days.

## 2012-06-10 LAB — GC/CHLAMYDIA PROBE AMP, GENITAL
Chlamydia, DNA Probe: NEGATIVE
GC Probe Amp, Genital: NEGATIVE

## 2012-06-15 ENCOUNTER — Other Ambulatory Visit: Payer: Self-pay | Admitting: Gynecology

## 2012-07-11 ENCOUNTER — Other Ambulatory Visit: Payer: Self-pay | Admitting: Gynecology

## 2012-11-22 ENCOUNTER — Other Ambulatory Visit: Payer: Self-pay | Admitting: Gynecology

## 2012-12-27 ENCOUNTER — Telehealth: Payer: Self-pay | Admitting: Family Medicine

## 2012-12-28 ENCOUNTER — Telehealth: Payer: Self-pay | Admitting: Family Medicine

## 2012-12-28 MED ORDER — OXYCODONE-ACETAMINOPHEN 10-325 MG PO TABS: 1.0000 | ORAL_TABLET | Freq: Three times a day (TID) | ORAL | Status: DC | PRN

## 2012-12-28 NOTE — Telephone Encounter (Signed)
Pt aware to pick up and make appt

## 2012-12-28 NOTE — Telephone Encounter (Signed)
Last OV 07/29/2012.  Last refill in our record 07/09/2012.  Registry shows refill on 10/27/2012 from you. Need approval for controlled medication.

## 2012-12-28 NOTE — Telephone Encounter (Signed)
Rx Printed, put up front and pt aware of p/u.  Melanie Richardson YN#[id number] p/u rx

## 2012-12-28 NOTE — Telephone Encounter (Signed)
She takes 1 po tid - #90/0

## 2013-01-03 ENCOUNTER — Telehealth: Payer: Self-pay | Admitting: Family Medicine

## 2013-01-03 MED ORDER — ZOLPIDEM TARTRATE 5 MG PO TABS: 5.0000 mg | ORAL_TABLET | Freq: Every evening | ORAL | Status: DC | PRN

## 2013-01-03 NOTE — Telephone Encounter (Signed)
Ok to refill #30

## 2013-01-03 NOTE — Telephone Encounter (Signed)
Ok to refill 

## 2013-01-03 NOTE — Telephone Encounter (Signed)
[facility]

## 2013-01-12 ENCOUNTER — Telehealth: Payer: Self-pay | Admitting: Family Medicine

## 2013-01-13 NOTE — Telephone Encounter (Signed)
PT is aware and will call office to make an appt

## 2013-01-13 NOTE — Telephone Encounter (Signed)
I really need to see her first.

## 2013-02-19 ENCOUNTER — Other Ambulatory Visit: Payer: Self-pay | Admitting: Family Medicine

## 2013-02-21 NOTE — Telephone Encounter (Signed)
Med rf °

## 2013-03-15 ENCOUNTER — Encounter: Payer: Self-pay | Admitting: Family Medicine

## 2013-03-15 ENCOUNTER — Ambulatory Visit (INDEPENDENT_AMBULATORY_CARE_PROVIDER_SITE_OTHER): Payer: BC Managed Care – PPO | Admitting: Family Medicine

## 2013-03-15 VITALS — BP 140/86 | HR 72 | Temp 98.5°F | Resp 16 | Wt 137.0 lb

## 2013-03-15 DIAGNOSIS — R05 Cough: Secondary | ICD-10-CM

## 2013-03-15 DIAGNOSIS — J019 Acute sinusitis, unspecified: Secondary | ICD-10-CM

## 2013-03-15 DIAGNOSIS — R059 Cough, unspecified: Secondary | ICD-10-CM

## 2013-03-15 MED ORDER — FLUTICASONE PROPIONATE 50 MCG/ACT NA SUSP: 2.0000 | Freq: Every day | NASAL | Status: DC

## 2013-03-15 MED ORDER — AMOXICILLIN-POT CLAVULANATE 875-125 MG PO TABS: 1.0000 | ORAL_TABLET | Freq: Two times a day (BID) | ORAL | Status: DC

## 2013-03-15 NOTE — Progress Notes (Signed)
Subjective:    Patient ID: Melanie Richardson, female    DOB: 1950-10-23, 62 y.o.   MRN: [id number]  HPI  Patient is here complaining of sinus congestion and sinus pain for 4-6 weeks. She also complains of a cough that has been present since the wintertime. She has a history of smoking for over 20 years. However she's been quit for over 30 years. She recently resumed smoking 6 months ago. The cough is nonproductive. She denies any fever. She denies any hemoptysis. She denies a shortness of breath. Over the last 4 weeks she developed constant pain and pressure in her frontal and maxillary sinuses. She is having rhinorrhea and postnasal drip. She's having headaches. She tried Benadryl without relief.  Past Medical History  Diagnosis Date  . MS (multiple sclerosis)   . Trigeminal neuralgia   . Hypertension   . Osteopenia 02/2009    t score -2.0   Current Outpatient Prescriptions on File Prior to Visit  Medication Sig Dispense Refill  . amitriptyline (ELAVIL) 50 MG tablet Take 50 mg by mouth at bedtime.      Marland Kitchen aspirin 81 MG tablet Take 81 mg by mouth daily.      Marland Kitchen BYSTOLIC 10 MG tablet TAKE 1 TABLET EVERY DAY  30 tablet  2  . carbamazepine (TEGRETOL XR) 400 MG 12 hr tablet Take 400 mg by mouth 3 (three) times daily.      . cholecalciferol (VITAMIN D) 1000 UNITS tablet Take 1,000 Units by mouth daily.      . diazepam (VALIUM) 5 MG tablet Take 5 mg by mouth every 6 (six) hours as needed.      Marland Kitchen ibuprofen (ADVIL,MOTRIN) 200 MG tablet Take 200 mg by mouth every 6 (six) hours as needed.      . interferon beta-1a (REBIF) 44 MCG/0.5ML injection Inject 44 mcg into the skin 3 (three) times a week.      . lamoTRIgine (LAMICTAL) 150 MG tablet Take 150 mg by mouth daily.      Marland Kitchen losartan (COZAAR) 100 MG tablet Take 100 mg by mouth daily.      . Multiple Vitamin (MULTIVITAMIN) tablet Take 1 tablet by mouth daily.      . Naproxen Sodium (ALEVE) 220 MG CAPS Take by mouth.      . oxyCODONE-acetaminophen  (PERCOCET) 10-325 MG per tablet Take 1 tablet by mouth every 8 (eight) hours as needed for pain.  90 tablet  0  . predniSONE (DELTASONE) 5 MG tablet Take 10 mg by mouth every other day.      . RELPAX 40 MG tablet TAKE 1 TABLET AT ONSET OF HEADACHE, MAY REPEAT IN 2 HOURS IF HEADACHE PERSISTS *MAX 2 PER 24 HOURS  12 tablet  1  . zolpidem (AMBIEN) 5 MG tablet Take 1 tablet (5 mg total) by mouth at bedtime as needed.  30 tablet  0   No current facility-administered medications on file prior to visit.   Allergies  Allergen Reactions  . Ciprofloxacin     Dizziness and headache  . Codeine     GI upset  . Erythromycin      Review of Systems  All other systems reviewed and are negative.       Objective:   Physical Exam  Vitals reviewed. Constitutional: She appears well-developed and well-nourished.  HENT:  Head: Normocephalic and atraumatic.  Right Ear: External ear normal.  Left Ear: External ear normal.  Nose: Nose normal.  Mouth/Throat: Oropharynx is clear and  moist. No oropharyngeal exudate.  Eyes: Conjunctivae are normal. Pupils are equal, round, and reactive to light. Right eye exhibits no discharge. Left eye exhibits no discharge. No scleral icterus.  Neck: Neck supple. No JVD present. No thyromegaly present.  Cardiovascular: Normal rate, regular rhythm and normal heart sounds.   No murmur heard. Pulmonary/Chest: Effort normal. No respiratory distress. She has wheezes. She has no rales.  Abdominal: Soft. Bowel sounds are normal.  Lymphadenopathy:    She has no cervical adenopathy.          Assessment & Plan:  1. Acute rhinosinusitis I believe this began as an allergy but has now turned into a secondary sinus infection. Begin Augmentin. Her allergies Flonase - amoxicillin-clavulanate (AUGMENTIN) 875-125 MG per tablet; Take 1 tablet by mouth 2 (two) times daily.  Dispense: 20 tablet; Refill: 0 - fluticasone (FLONASE) 50 MCG/ACT nasal spray; Place 2 sprays into the nose  daily.  Dispense: 16 g; Refill: 6  2. Cough Believe the patient likely has some underlying emphysema due to her history of tobacco abuse. I think this is recently been worsened by her recent option of smoking and possibly allergies. I recommended treating her allergies and discontinuing cigarette use.  Will check a chest x-ray - DG Chest 2 View; Future  Patient asked for refill on Valium as well as her Percocet. She takes Valium and painkillers for trigeminal neuralgia. However she's become increasingly more delusional over the last year. I last saw the patient in October 2013. At that time, the patient thought that her husband was having an affair.  Her belief was unfounded and delusional. Furthermore she has delusions of persecution. I am concerned she has some underlying mild dementia likely related to her multiple sclerosis. I'm also concerned about polypharmacy exacerbating that. Therefore I was adamant with the patient I thought we needed to begin weaning her off some of the mood altering substances she is taking. I recommended that she decrease her Percocet from 60 a month to 30 a month. I would continue to wean this down as much as possible. I also refused to refill her Valium. This is prescribed by her neurologist at [facility]. Her neurologist had wanted her to see a psychiatrist but she has not followed up as planned.  Therefore she needs to call him regardingt his refill. I told the patient I did not feel it was wise for her to continue to take that without first seeing a psychiatrist as planned.

## 2013-03-17 ENCOUNTER — Ambulatory Visit
Admission: RE | Admit: 2013-03-17 | Discharge: 2013-03-17 | Disposition: A | Discharge status: Home / Self Care | Payer: BC Managed Care – PPO | Source: Ambulatory Visit | Attending: Family Medicine | Admitting: Family Medicine

## 2013-03-17 DIAGNOSIS — R05 Cough: Secondary | ICD-10-CM

## 2013-03-17 DIAGNOSIS — R059 Cough, unspecified: Secondary | ICD-10-CM

## 2013-03-28 ENCOUNTER — Other Ambulatory Visit: Payer: Self-pay | Admitting: Gynecology

## 2013-03-28 NOTE — Telephone Encounter (Addendum)
Patient has CE scheduled with you for 05/02/13. You might note in her chart that her PCP Dr. Tanya Nones has prescribed Oxycodone, Ambien and Xanax for her since March. Just FYI as I noticed this when viewing encounters for last OV.

## 2013-04-13 ENCOUNTER — Other Ambulatory Visit: Payer: Self-pay | Admitting: Physician Assistant

## 2013-04-13 ENCOUNTER — Ambulatory Visit (INDEPENDENT_AMBULATORY_CARE_PROVIDER_SITE_OTHER): Payer: BC Managed Care – PPO | Admitting: Physician Assistant

## 2013-04-13 ENCOUNTER — Encounter: Payer: Self-pay | Admitting: Physician Assistant

## 2013-04-13 VITALS — BP 134/88 | HR 76 | Temp 98.5°F | Resp 18 | Ht 65.0 in | Wt 126.0 lb

## 2013-04-13 DIAGNOSIS — R197 Diarrhea, unspecified: Secondary | ICD-10-CM

## 2013-04-14 LAB — CELIAC PANEL 10
Gliadin IgA: 5.4 U/mL (ref <20)
Gliadin IgG: 17.1 U/mL (ref <20)

## 2013-04-14 LAB — CBC WITH DIFFERENTIAL/PLATELET
Basophils Absolute: 0.1 10*3/uL (ref 0.0–0.1)
Basophils Relative: 1 % (ref 0–1)
HCT: 36.9 % (ref 36.0–46.0)
Lymphocytes Relative: 34 % (ref 12–46)
MCHC: 34.1 g/dL (ref 30.0–36.0)
Monocytes Absolute: 0.5 10*3/uL (ref 0.1–1.0)
Neutro Abs: 3.7 10*3/uL (ref 1.7–7.7)
Neutrophils Relative %: 53 % (ref 43–77)
RDW: 13.1 % (ref 11.5–15.5)
WBC: 7.1 10*3/uL (ref 4.0–10.5)

## 2013-04-14 LAB — COMPLETE METABOLIC PANEL WITH GFR
ALT: 22 U/L (ref 0–35)
AST: 21 U/L (ref 0–37)
Albumin: 3.9 g/dL (ref 3.5–5.2)
Alkaline Phosphatase: 68 U/L (ref 39–117)
Calcium: 9.5 mg/dL (ref 8.4–10.5)
Chloride: 107 mEq/L (ref 96–112)
Potassium: 5.2 mEq/L (ref 3.5–5.3)
Sodium: 138 mEq/L (ref 135–145)
Total Protein: 6.6 g/dL (ref 6.0–8.3)

## 2013-04-14 LAB — T4, FREE: Free T4: 0.78 ng/dL — ABNORMAL LOW (ref 0.80–1.80)

## 2013-04-14 LAB — TSH: TSH: 4.766 u[IU]/mL — ABNORMAL HIGH (ref 0.350–4.500)

## 2013-04-14 NOTE — Progress Notes (Signed)
Patient ID: KEYMIAH LYLES MRN: [id number], DOB: 19-Feb-1951, 62 y.o. Date of Encounter: @DATE @  Chief Complaint:  Chief Complaint  Patient presents with  . c/o stomach problems    diarrhea, cramping, n/v, wt loss for one month pt states she has self weaned her off of her amitriptyline, prednisone, has cut her lamictal in half    HPI: 62 y.o. year old white female  presents with her husband.  She reports that she started having diarrhea at the beginning of June. Then 6/15 she was given abx for URI. This caused GI upset so she stopped the abx after 2 days. That caused diarrhea to flare again. Then, soon after that, she ate at a restaurant and "got some bad fish"-that night had N/V/D. That got it flared up again.  Now, a lot of times, immediately after she eats, she has bm which is either really soft or diarrhea. However, sometimes she eats and does not immediatley go to BR. It seems that it flares for 2-3 days then better for 2-3 days.  She has had no abdominal pain at all. No nausea or vomiting. No anorexia.   Husband also adds that she has decreased lamictal on her own and stoped amitriptylline and prednisone. She says it is b/c Dr. Tanya Nones told her she was on a lot of meds and should try to avoid overmedication. (I read his OV note 03/15/13. At that time she requested refill on Valium and Percocet. However, she had also become increasingly delusional over past year. (see that note for more details).  Then she tells husband that she thinks her Neurologist is a very intelligent doctor but that "he has just gotten too busy and rushed" . Then husband suggest she see a differnet neuro if she doesnot trust his judgemetn. She did not seem committed to that decision either howevere.    Past Medical History  Diagnosis Date  . MS (multiple sclerosis)   . Trigeminal neuralgia   . Hypertension   . Osteopenia 02/2009    t score -2.0     Home Meds: See attached medication section for current  medication list. Any medications entered into computer today will not appear on this note's list. The medications listed below were entered prior to today. Current Outpatient Prescriptions on File Prior to Visit  Medication Sig Dispense Refill  . aspirin 81 MG tablet Take 81 mg by mouth daily.      Marland Kitchen BYSTOLIC 10 MG tablet TAKE 1 TABLET EVERY DAY  30 tablet  2  . carbamazepine (TEGRETOL XR) 400 MG 12 hr tablet Take 400 mg by mouth 3 (three) times daily.      . cholecalciferol (VITAMIN D) 1000 UNITS tablet Take 1,000 Units by mouth daily.      . diazepam (VALIUM) 5 MG tablet Take 5 mg by mouth every 6 (six) hours as needed.      . interferon beta-1a (REBIF) 44 MCG/0.5ML injection Inject 44 mcg into the skin 3 (three) times a week.      . losartan (COZAAR) 100 MG tablet Take 100 mg by mouth daily.      . Multiple Vitamin (MULTIVITAMIN) tablet Take 1 tablet by mouth daily.      Marland Kitchen oxyCODONE-acetaminophen (PERCOCET) 10-325 MG per tablet Take 1 tablet by mouth every 8 (eight) hours as needed for pain.  90 tablet  0  . RELPAX 40 MG tablet TAKE 1 TABLET AT ONSET OF HEADACHE, MAY REPEAT IN 2 HOURS IF HEADACHE PERSISTS *  MAX 2 PER 24 HOURS  12 tablet  0  . zolpidem (AMBIEN) 5 MG tablet Take 1 tablet (5 mg total) by mouth at bedtime as needed.  30 tablet  0  . amitriptyline (ELAVIL) 50 MG tablet Take 50 mg by mouth at bedtime.      Marland Kitchen amoxicillin-clavulanate (AUGMENTIN) 875-125 MG per tablet Take 1 tablet by mouth 2 (two) times daily.  20 tablet  0  . fluticasone (FLONASE) 50 MCG/ACT nasal spray Place 2 sprays into the nose daily.  16 g  6  . ibuprofen (ADVIL,MOTRIN) 200 MG tablet Take 200 mg by mouth every 6 (six) hours as needed.      . lamoTRIgine (LAMICTAL) 150 MG tablet Take 150 mg by mouth daily.      . Naproxen Sodium (ALEVE) 220 MG CAPS Take by mouth.      . predniSONE (DELTASONE) 5 MG tablet Take 10 mg by mouth every other day.       No current facility-administered medications on file prior to  visit.    Allergies:  Allergies  Allergen Reactions  . Ciprofloxacin     Dizziness and headache  . Codeine     GI upset  . Erythromycin     History   Social History  . Marital Status: Married    Spouse Name: N/A    Number of Children: N/A  . Years of Education: N/A   Occupational History  . Not on file.   Social History Main Topics  . Smoking status: Former Games developer  . Smokeless tobacco: Never Used  . Alcohol Use: Yes     Comment: rarely  . Drug Use: No  . Sexually Active: Yes    Birth Control/ Protection: Post-menopausal   Other Topics Concern  . Not on file   Social History Narrative  . No narrative on file    Family History  Problem Relation Age of Onset  . Heart disease Mother   . Hypertension Father   . Cancer Father     colon  . Stroke Father   . Breast cancer Maternal Aunt      Review of Systems:  See HPI for pertinent ROS. All other ROS negative.    Physical Exam: Blood pressure 134/88, pulse 76, temperature 98.5 F (36.9 C), temperature source Oral, resp. rate 18, height 5\' 5"  (1.651 m), weight 126 lb (57.153 kg), last menstrual period 10/06/1996., Body mass index is 20.97 kg/(m^2). General: WNWD WF. Appears in no acute distress. Lungs: Clear bilaterally to auscultation without wheezes, rales, or rhonchi. Breathing is unlabored. Heart: RRR with S1 S2. No murmurs, rubs, or gallops. Abdomen: Soft, non-tender, non-distended with normoactive bowel sounds. No hepatomegaly. No rebound/guarding. No obvious abdominal masses.No area of tenderness at all.  Musculoskeletal:  Strength and tone normal for age. Extremities/Skin: Warm and dry. No clubbing or cyanosis. No edema. No rashes or suspicious lesions. Neuro: Alert and oriented X 3. Moves all extremities spontaneously. Gait is normal. CNII-XII grossly in tact. Psych:  See HPI.     ASSESSMENT AND PLAN:  62 y.o. year old female with  1. Diarrhea Will check labs. Will have her start probiotic now.   - CBC with Differential - COMPLETE METABOLIC PANEL WITH GFR - TSH - Sedimentation rate - Clostridium difficile toxin; Future - Ova and parasite screen; Future - Stool culture; Future - Celiac panel 10  2. Psych/ Medication Noncompliance: Her husband and I have both told her not to adjust medications on her own. She  needs to resume medictions as they were prescried. If she does not trust her providers treatment, she needs to see a provider she does trust.   Signed, [address] [place], [place], [facility] 04/14/2013 6:42 AM

## 2013-04-15 ENCOUNTER — Telehealth: Payer: Self-pay | Admitting: Family Medicine

## 2013-04-15 DIAGNOSIS — E039 Hypothyroidism, unspecified: Secondary | ICD-10-CM

## 2013-04-15 MED ORDER — LEVOTHYROXINE SODIUM 25 MCG PO CAPS: 25.0000 ug | ORAL_CAPSULE | Freq: Every day | ORAL | Status: DC

## 2013-04-15 NOTE — Telephone Encounter (Signed)
Spoke to spouse about labs.  Rx sent to pharmacy and follow up labs ordered.

## 2013-04-15 NOTE — Telephone Encounter (Signed)
Message copied by Donne Anon on Fri Apr 15, 2013  2:41 PM ------      Message from: Allayne Butcher      Created: Fri Apr 15, 2013 10:27 AM       Pt had OV sec to diarrhea for weeks. Labs for that so far are normal: CBC,CMET,Celiac Panel, Sed Rate (would be elevated with ulcerative colitis, etc)      Stool cultures are pending.       Her thyroid function is slightly low. Start Levothyroxine one po QD  # 30 / one refill. Recheck TSH 6 weeks. Order med and lab. ------

## 2013-04-19 ENCOUNTER — Other Ambulatory Visit: Payer: Self-pay | Admitting: Physician Assistant

## 2013-04-19 ENCOUNTER — Other Ambulatory Visit: Payer: BC Managed Care – PPO

## 2013-04-19 DIAGNOSIS — R197 Diarrhea, unspecified: Secondary | ICD-10-CM

## 2013-04-21 LAB — OVA AND PARASITE SCREEN: OP: NONE SEEN

## 2013-04-22 ENCOUNTER — Telehealth: Payer: Self-pay | Admitting: Family Medicine

## 2013-04-22 MED ORDER — FLUCONAZOLE 150 MG PO TABS: ORAL_TABLET | ORAL | Status: DC

## 2013-04-22 NOTE — Telephone Encounter (Signed)
Message copied by Donne Anon on Fri Apr 22, 2013 12:25 PM ------      Message from: Allayne Butcher      Created: Thu Apr 21, 2013  1:29 PM       All stool tests are back. The only abnormality they show is Yeast.      Rx; Diflucan 150mg  one po QOD x 2 doses  # 2 / 0 refills      Also, take probiotic Daily for 3 months. ------

## 2013-04-22 NOTE — Telephone Encounter (Signed)
Pt made aware of lab results.  Rx to pharmacy.  Pt given instructions on QOD dosing.  Also advised to take probiotic daily for 3 months

## 2013-04-25 ENCOUNTER — Telehealth: Payer: Self-pay | Admitting: Family Medicine

## 2013-04-25 MED ORDER — LOSARTAN POTASSIUM 100 MG PO TABS: 100.0000 mg | ORAL_TABLET | Freq: Every day | ORAL | Status: DC

## 2013-04-25 NOTE — Telephone Encounter (Signed)
Rx Refilled  

## 2013-05-01 ENCOUNTER — Other Ambulatory Visit: Payer: Self-pay | Admitting: Family Medicine

## 2013-05-02 ENCOUNTER — Other Ambulatory Visit (HOSPITAL_COMMUNITY)
Admission: RE | Admit: 2013-05-02 | Discharge: 2013-05-02 | Disposition: A | Discharge status: Home / Self Care | Payer: BC Managed Care – PPO | Source: Ambulatory Visit | Attending: Gynecology | Admitting: Gynecology

## 2013-05-02 ENCOUNTER — Encounter: Payer: Self-pay | Admitting: Gynecology

## 2013-05-02 ENCOUNTER — Ambulatory Visit (INDEPENDENT_AMBULATORY_CARE_PROVIDER_SITE_OTHER): Payer: BC Managed Care – PPO | Admitting: Gynecology

## 2013-05-02 VITALS — BP 110/64 | Ht 66.0 in | Wt 126.0 lb

## 2013-05-02 DIAGNOSIS — Z01419 Encounter for gynecological examination (general) (routine) without abnormal findings: Secondary | ICD-10-CM

## 2013-05-02 DIAGNOSIS — N952 Postmenopausal atrophic vaginitis: Secondary | ICD-10-CM

## 2013-05-02 DIAGNOSIS — M899 Disorder of bone, unspecified: Secondary | ICD-10-CM

## 2013-05-02 DIAGNOSIS — M8580 Other specified disorders of bone density and structure, unspecified site: Secondary | ICD-10-CM

## 2013-05-02 DIAGNOSIS — Z1151 Encounter for screening for human papillomavirus (HPV): Secondary | ICD-10-CM | POA: Insufficient documentation

## 2013-05-02 MED ORDER — DIAZEPAM 5 MG PO TABS: 5.0000 mg | ORAL_TABLET | Freq: Four times a day (QID) | ORAL | Status: DC | PRN

## 2013-05-02 NOTE — Patient Instructions (Addendum)
Schedule colonoscopy with Dr. Arlyce Dice or [facility] / [facility] gastroenterology Followup for mammography. Schedule bone density Followup in one year for annual exam.

## 2013-05-02 NOTE — Progress Notes (Signed)
Melanie Richardson 06-28-1951 [id number]        62 y.o.  G2P2 for annual exam.  Several issues noted below.  Past medical history,surgical history, medications, allergies, family history and social history were all reviewed and documented in the EPIC chart.  ROS:  Performed and pertinent positives and negatives are included in the history, assessment and plan .  Exam: Kim assistant Filed Vitals:   05/02/13 1402  BP: 110/64  Height: 5\' 6"  (1.676 m)  Weight: 126 lb (57.153 kg)   General appearance  Normal Skin grossly normal Head/Neck normal with no cervical or supraclavicular adenopathy thyroid normal Lungs  clear Cardiac RR, without RMG Abdominal  soft, nontender, without masses, organomegaly or hernia Breasts  examined lying and sitting without masses, retractions, discharge or axillary adenopathy. Pelvic  Ext/BUS/vagina  normal with atrophic changes  Cervix  normal with atrophic changes. Pap/HPV  Uterus  anteverted, normal size, shape and contour, midline and mobile nontender   Adnexa  Without masses or tenderness    Anus and perineum  normal   Rectovaginal  normal sphincter tone without palpated masses or tenderness.    Assessment/Plan:  62 y.o. G2P2 female for annual exam.   1. Postmenopausal/atrophic genital changes. Patient without significant symptoms such as hot flushes, night sweats, vaginal dryness or dyspareunia. We'll continue to monitor. 2. Osteopenia. DEXA 2010 with T score -2.0. Had been on chronic steroids. Never followed up for DEXA as I recommended last year. I again strongly recommended she schedule DEXA and she agrees to do so. Increase calcium vitamin D reviewed. 3. Pap smear 2011. Pap/HPV today. No history of significant abnormal Pap smears previously. Plan 3 year repeat assuming this Pap smear is normal. 4. Mammography 01/2012. Patient overdue and knows to schedule. SBE monthly reviewed. 5. Colonoscopy never. I strongly recommended scheduling a screening  colonoscopy and gave her names to call. She says she's going to call Dr. Arlyce Dice to schedule. 6. Marital discord. Patient continues to have issues with her marriage. We discussed counseling/legal support. Strongly recommended that she move forward in helping to resolve the discord. 7. Valium. Her neurologist prescribes Valium for her. She asked if I could prescribe this. I reviewed that this really needs to stay within the same physician that did give her a prescription for Valium 5 mg #30 with no refill until she can make an appointment to see her neurologist. 8. Health maintenance. No blood work done as this is done through her primary physician's office. Followup for DEXA mammography and colonoscopy. Followup here in one year assuming she continues well.  Note: This document was prepared with digital dictation and possible smart phrase technology. Any transcriptional errors that result from this process are unintentional.   [name], 2:54 PM 05/02/2013

## 2013-05-02 NOTE — Addendum Note (Signed)
Addended by: Dayna Barker on: 05/02/2013 03:05 PM   Modules accepted: Orders

## 2013-05-03 LAB — URINALYSIS W MICROSCOPIC + REFLEX CULTURE
Bilirubin Urine: NEGATIVE
Casts: NONE SEEN
Glucose, UA: NEGATIVE mg/dL
Hgb urine dipstick: NEGATIVE
Ketones, ur: NEGATIVE mg/dL
Nitrite: NEGATIVE
Specific Gravity, Urine: 1.017 (ref 1.005–1.030)
pH: 7 (ref 5.0–8.0)

## 2013-05-16 ENCOUNTER — Other Ambulatory Visit: Payer: Self-pay | Admitting: Family Medicine

## 2013-05-16 NOTE — Telephone Encounter (Signed)
Meds refilled.

## 2013-05-31 ENCOUNTER — Ambulatory Visit
Admission: RE | Admit: 2013-05-31 | Discharge: 2013-05-31 | Disposition: A | Discharge status: Home / Self Care | Payer: BC Managed Care – PPO | Source: Ambulatory Visit | Attending: Family Medicine | Admitting: Family Medicine

## 2013-05-31 ENCOUNTER — Encounter: Payer: Self-pay | Admitting: Family Medicine

## 2013-05-31 ENCOUNTER — Ambulatory Visit (INDEPENDENT_AMBULATORY_CARE_PROVIDER_SITE_OTHER): Payer: BC Managed Care – PPO | Admitting: Family Medicine

## 2013-05-31 VITALS — BP 126/74 | HR 80 | Temp 98.2°F | Resp 16 | Wt 124.0 lb

## 2013-05-31 DIAGNOSIS — R1900 Intra-abdominal and pelvic swelling, mass and lump, unspecified site: Secondary | ICD-10-CM

## 2013-05-31 NOTE — Progress Notes (Signed)
  Subjective:    Patient ID: Chudney Scheffler Courts, female    DOB: 1951-07-15, 62 y.o.   MRN: [id number]  HPI Ultrasound of the abdomen returned a 3.5 x 3.8 cm localized fluid collection with peripheral rim of calcification. This could be either hematoma or abscess. There last patient to return for incision and drainage.   Review of Systems     Objective:   Physical Exam        Assessment & Plan:  1. Palpable abdominal mass 5 cm x 5 cm subcutaneous hard mass in the right upper quadrant of the abdomen was anesthetized with 0.1% lidocaine with epinephrine. A 1 cm x 1 cm cruciate incision was made and expanded using hemostat.  Approximately 5 cc of clear to yellow serous fluid was drained from the cavity. A wound culture was sent however this has the characteristics of a seroma.  The wound was probed and cleaned with hydrogen peroxide. The wound is then packed with 6 cm of 1/4" iodoform gauze.  Wound care was discussed. I recommended that the patient finish the doxycycline and she likely did have an infection there was cellulitis. However the mass is most likely a seroma/hematoma that  should slowly heal over time. - US Abdomen Limited; Future - Culture, routine-abscess

## 2013-05-31 NOTE — Progress Notes (Signed)
Subjective:    Patient ID: Melanie Richardson, female    DOB: Apr 01, 1951, 62 y.o.   MRN: [id number]  HPI Patient self administers rebif injections for her multiple sclerosis. One week ago after giving herself a shot in her right abdomen, she developed a hard red painful subcutaneous mass. Her neurologist started her on doxycycline. The redness and warmth have resolved however she continues to have a hard, painful, subcutaneous mass in the right upper quadrant. It doess not have the texture or feel of an abscess.  Instead it feels like a hard hematoma or scar tissue. However the neurologist recommended she see her primary care physician to determine if it needs surgical drainage. The patient states that she feels ill. She describes fatigue and general myalgias since becoming sick. She is completed 1 week of doxycycline and has another 7 days remaining. Past Medical History  Diagnosis Date  . MS (multiple sclerosis)   . Trigeminal neuralgia   . Hypertension   . Osteopenia 02/2009    t score -2.0  . Thyroid disease     Hypothyroid   Past Surgical History  Procedure Laterality Date  . Underarm surgery  1988  . Feet surgery  1990  . Elbow surgery  1999    shattered elbow  . Radiofrquency rhizotomy  [id number]  . Gamma knife  2005  . Back surgery  2011   Current Outpatient Prescriptions on File Prior to Visit  Medication Sig Dispense Refill  . aspirin 81 MG tablet Take 81 mg by mouth daily.      Marland Kitchen BYSTOLIC 10 MG tablet TAKE 1 TABLET BY MOUTH DAILY  30 tablet  2  . carbamazepine (TEGRETOL XR) 400 MG 12 hr tablet Take 400 mg by mouth 3 (three) times daily.      . cholecalciferol (VITAMIN D) 1000 UNITS tablet Take 1,000 Units by mouth daily.      . diazepam (VALIUM) 5 MG tablet Take 1 tablet (5 mg total) by mouth every 6 (six) hours as needed.  30 tablet  0  . ibuprofen (ADVIL,MOTRIN) 200 MG tablet Take 200 mg by mouth every 6 (six) hours as needed.      . interferon beta-1a (REBIF) 44 MCG/0.5ML  injection Inject 44 mcg into the skin 3 (three) times a week.      . lamoTRIgine (LAMICTAL) 150 MG tablet Take 150 mg by mouth daily.      . Levothyroxine Sodium 25 MCG CAPS Take 1 capsule (25 mcg total) by mouth daily before breakfast.  30 capsule  1  . losartan (COZAAR) 100 MG tablet TAKE 1 TABLET BY MOUTH EVERY DAY  30 tablet  5  . Multiple Vitamin (MULTIVITAMIN) tablet Take 1 tablet by mouth daily.      . Naproxen Sodium (ALEVE) 220 MG CAPS Take by mouth.      . oxyCODONE-acetaminophen (PERCOCET) 10-325 MG per tablet Take 1 tablet by mouth every 8 (eight) hours as needed for pain.  90 tablet  0  . RELPAX 40 MG tablet TAKE 1 TABLET AT ONSET OF HEADACHE, MAY REPEAT IN 2 HOURS IF HEADACHE PERSISTS *MAX 2 PER 24 HOURS  12 tablet  0  . zolpidem (AMBIEN) 5 MG tablet Take 1 tablet (5 mg total) by mouth at bedtime as needed.  30 tablet  0   No current facility-administered medications on file prior to visit.   Allergies  Allergen Reactions  . Ciprofloxacin     Dizziness and headache  . Codeine  GI upset  . Erythromycin    History   Social History  . Marital Status: Married    Spouse Name: N/A    Number of Children: N/A  . Years of Education: N/A   Occupational History  . Not on file.   Social History Main Topics  . Smoking status: Current Every Day Smoker -- 1.00 packs/day    Types: Cigarettes  . Smokeless tobacco: Never Used  . Alcohol Use: Yes     Comment: rarely  . Drug Use: No  . Sexual Activity: Yes    Birth Control/ Protection: Post-menopausal   Other Topics Concern  . Not on file   Social History Narrative  . No narrative on file      Review of Systems  All other systems reviewed and are negative.       Objective:   Physical Exam  Vitals reviewed. Cardiovascular: Normal rate and regular rhythm.   Pulmonary/Chest: Effort normal and breath sounds normal.  Abdominal: Soft. Bowel sounds are normal. She exhibits mass. She exhibits no distension. There is  no tenderness. There is no rebound and no guarding.   patient has a 5 cm x 5 cm subcutaneous firm mass in the right upper quadrant. It has feel a hard calcified hematoma versus scar tissue.        Assessment & Plan:  1. Palpable abdominal mass Clinically it does not appear to be an abscess. I am concerned it is most likely scar tissue or a calcified hematoma.  However given the history of fever, redness, and signs of systemic illness coupled with the fact symptoms have improved on doxycycline, I will obtain an ultrasound of the skin to rule out a fluid collection that may require surgical debridement.  If this is an abscess which is extremely deep, it may require surgical consultation. - US Abdomen Limited; Future

## 2013-05-31 NOTE — Addendum Note (Signed)
Addended by: Lynnea Ferrier on: 05/31/2013 01:39 PM   Modules accepted: Orders

## 2013-06-04 LAB — CULTURE, ROUTINE-ABSCESS
Gram Stain: NONE SEEN
Organism ID, Bacteria: NO GROWTH

## 2013-07-15 ENCOUNTER — Ambulatory Visit (INDEPENDENT_AMBULATORY_CARE_PROVIDER_SITE_OTHER): Payer: BC Managed Care – PPO | Admitting: Family Medicine

## 2013-07-15 ENCOUNTER — Encounter: Payer: Self-pay | Admitting: Family Medicine

## 2013-07-15 VITALS — BP 142/86 | HR 72 | Temp 98.1°F | Resp 18 | Ht 64.0 in | Wt 122.0 lb

## 2013-07-15 DIAGNOSIS — R062 Wheezing: Secondary | ICD-10-CM

## 2013-07-15 DIAGNOSIS — J019 Acute sinusitis, unspecified: Secondary | ICD-10-CM

## 2013-07-15 DIAGNOSIS — E039 Hypothyroidism, unspecified: Secondary | ICD-10-CM

## 2013-07-15 MED ORDER — FLUTICASONE PROPIONATE 50 MCG/ACT NA SUSP: 2.0000 | Freq: Every day | NASAL | Status: DC

## 2013-07-15 MED ORDER — ALBUTEROL SULFATE HFA 108 (90 BASE) MCG/ACT IN AERS: 2.0000 | INHALATION_SPRAY | Freq: Four times a day (QID) | RESPIRATORY_TRACT | Status: DC | PRN

## 2013-07-15 MED ORDER — AMOXICILLIN-POT CLAVULANATE 875-125 MG PO TABS: 1.0000 | ORAL_TABLET | Freq: Two times a day (BID) | ORAL | Status: DC

## 2013-07-15 NOTE — Patient Instructions (Signed)
We will call with lab results Take antibiotics as prescribed Use inhaler and nose spray Stop the sudafed F/U as needed

## 2013-07-17 DIAGNOSIS — J019 Acute sinusitis, unspecified: Secondary | ICD-10-CM | POA: Insufficient documentation

## 2013-07-17 DIAGNOSIS — E039 Hypothyroidism, unspecified: Secondary | ICD-10-CM | POA: Insufficient documentation

## 2013-07-17 DIAGNOSIS — R062 Wheezing: Secondary | ICD-10-CM | POA: Insufficient documentation

## 2013-07-17 NOTE — Assessment & Plan Note (Signed)
She is getting post nasal drip , which is causing cough Augmentin Flonase for sinuses Stop the sudafed has overused

## 2013-07-17 NOTE — Assessment & Plan Note (Signed)
Check TFT and adjust dose as needed

## 2013-07-17 NOTE — Assessment & Plan Note (Signed)
She is a smoker most of her cough is post nasal drip however concern for underlying bronchitis She would benefit from PFT done in near future Albuterol trial

## 2013-07-17 NOTE — Progress Notes (Signed)
  Subjective:    Patient ID: Karmin Kasprzak Culotta, female    DOB: [birth year], 62 y.o.   MRN: [id number]  HPI  Pt here with sinus drainage and pressure for the past 3 weeks. Has cough with mild production due to post nasal drip. Has wheezing at times during day.. +tobacco use.  Has not used flonase Has used sudafed on and off, tends to use for her sinuses and allergies. Denies fever, SOB.  Meds reviewed  Review of Systems  GEN- denies fatigue, fever, weight loss,weakness, recent illness HEENT- denies eye drainage, change in vision, +nasal discharge, CVS- denies chest pain, palpitations RESP- denies SOB, +cough, +wheeze Neuro- denies headache, dizziness, syncope, seizure activity       Objective:   Physical Exam   GEN- NAD, alert and oriented x3 HEENT- PERRL, EOMI, non injected sclera, pink conjunctiva, MMM, oropharynx clear, TM clear bilat no effusion, + maxillary sinus tenderness, inflammed turbinates,  Nasal drainage  Neck- Supple, no LAD CVS- RRR, no murmur RESP-scattered wheeze, no rhonchi, normal WOB, no retractions EXT- No edema Pulses- Radial 2+         Assessment & Plan:

## 2013-07-19 MED ORDER — LEVOTHYROXINE SODIUM 50 MCG PO TABS: 50.0000 ug | ORAL_TABLET | Freq: Every day | ORAL | Status: DC

## 2013-07-19 NOTE — Addendum Note (Signed)
Addended by: Elvina Mattes T on: 07/19/2013 11:34 AM   Modules accepted: Orders

## 2013-07-19 NOTE — Addendum Note (Signed)
Addended by: Elvina Mattes T on: 07/19/2013 11:33 AM   Modules accepted: Orders

## 2013-07-22 ENCOUNTER — Other Ambulatory Visit: Payer: Self-pay | Admitting: Neurosurgery

## 2013-07-22 DIAGNOSIS — M5416 Radiculopathy, lumbar region: Secondary | ICD-10-CM

## 2013-08-02 ENCOUNTER — Ambulatory Visit (INDEPENDENT_AMBULATORY_CARE_PROVIDER_SITE_OTHER): Payer: BC Managed Care – PPO | Admitting: Family Medicine

## 2013-08-02 ENCOUNTER — Ambulatory Visit
Admission: RE | Admit: 2013-08-02 | Discharge: 2013-08-02 | Disposition: A | Discharge status: Home / Self Care | Payer: BC Managed Care – PPO | Source: Ambulatory Visit | Attending: Neurosurgery | Admitting: Neurosurgery

## 2013-08-02 DIAGNOSIS — M5416 Radiculopathy, lumbar region: Secondary | ICD-10-CM

## 2013-08-02 DIAGNOSIS — Z23 Encounter for immunization: Secondary | ICD-10-CM

## 2013-08-02 MED ORDER — GADOBENATE DIMEGLUMINE 529 MG/ML IV SOLN
10.0000 mL | Freq: Once | INTRAVENOUS | Status: AC | PRN
  Administered 2013-08-02: 10 mL via INTRAVENOUS

## 2013-08-12 ENCOUNTER — Other Ambulatory Visit: Payer: Self-pay | Admitting: Family Medicine

## 2013-09-14 ENCOUNTER — Other Ambulatory Visit: Payer: Self-pay | Admitting: Family Medicine

## 2013-09-15 ENCOUNTER — Other Ambulatory Visit: Payer: Self-pay | Admitting: Family Medicine

## 2013-09-15 ENCOUNTER — Encounter: Payer: Self-pay | Admitting: Gynecology

## 2013-09-15 ENCOUNTER — Ambulatory Visit (INDEPENDENT_AMBULATORY_CARE_PROVIDER_SITE_OTHER): Payer: BC Managed Care – PPO | Admitting: Gynecology

## 2013-09-15 DIAGNOSIS — R300 Dysuria: Secondary | ICD-10-CM

## 2013-09-15 DIAGNOSIS — N898 Other specified noninflammatory disorders of vagina: Secondary | ICD-10-CM

## 2013-09-15 DIAGNOSIS — N952 Postmenopausal atrophic vaginitis: Secondary | ICD-10-CM

## 2013-09-15 LAB — WET PREP FOR TRICH, YEAST, CLUE
Clue Cells Wet Prep HPF POC: NONE SEEN
Yeast Wet Prep HPF POC: NONE SEEN

## 2013-09-15 LAB — URINALYSIS W MICROSCOPIC + REFLEX CULTURE
Hgb urine dipstick: NEGATIVE
Leukocytes, UA: NEGATIVE
Nitrite: NEGATIVE
Urobilinogen, UA: 0.2 mg/dL (ref 0.0–1.0)
pH: 7 (ref 5.0–8.0)

## 2013-09-15 MED ORDER — FLUCONAZOLE 150 MG PO TABS: 150.0000 mg | ORAL_TABLET | Freq: Once | ORAL | Status: DC

## 2013-09-15 MED ORDER — LOSARTAN POTASSIUM 100 MG PO TABS: ORAL_TABLET | ORAL | Status: DC

## 2013-09-15 MED ORDER — CLINDAMYCIN PHOSPHATE 2 % VA CREA: 1.0000 | TOPICAL_CREAM | Freq: Every day | VAGINAL | Status: DC

## 2013-09-15 NOTE — Progress Notes (Signed)
Patient presents with several days of vaginal discharge. No itching, irritation or odor. No frequency dysuria or urgency. Was recently treated with antibiotics for bronchitis.  Exam was Administrator, Civil Service vagina with atrophic changes. Scant discharge. Cervix normal. Uterus normal size mobile nontender. Adnexa without masses or tenderness.  Assessment and plan: Slight vaginal discharge historically. Urinalysis is negative. Wet prep is unremarkable. We'll treat for a low-grade BV with Cleocin vaginal cream nightly x7 days. Also cover her with Diflucan 150 mg times one pill given her recent antibiotic use. Followup if symptoms persist worsen or recur.

## 2013-09-15 NOTE — Telephone Encounter (Signed)
Rx Refilled  

## 2013-09-15 NOTE — Addendum Note (Signed)
Addended by: Dayna Barker on: 09/15/2013 04:02 PM   Modules accepted: Orders

## 2013-09-15 NOTE — Patient Instructions (Signed)
Use vaginal cream nightly x7 days. Take Diflucan pill once. Followup if symptoms persist, worsen or recur.

## 2013-09-16 ENCOUNTER — Telehealth: Payer: Self-pay | Admitting: *Deleted

## 2013-09-16 DIAGNOSIS — G500 Trigeminal neuralgia: Secondary | ICD-10-CM

## 2013-09-16 DIAGNOSIS — G35 Multiple sclerosis: Secondary | ICD-10-CM

## 2013-09-16 NOTE — Telephone Encounter (Addendum)
Notes faxed to [facility] to be scheduled. Referral placed in epic as well

## 2013-09-16 NOTE — Telephone Encounter (Signed)
Message copied by Aura Camps on Fri Sep 16, 2013 11:53 AM ------      Message from: Dara Lords      Created: Thu Sep 15, 2013  3:57 PM       Patient needs referral for:      #1 Gen. medical doctor such as [facility]      #2 neurologist. History of trigeminal neuralgia and MS. Suggest Dr. Porfirio Mylar Dohmeier or someone in her group. ------

## 2013-09-27 NOTE — Telephone Encounter (Signed)
Melanie Richardson called and has spoke with patient, but pt has not yet been scheduled. Pt told Lafonda Mosses that she would have to call her back.

## 2013-09-27 NOTE — Telephone Encounter (Signed)
Left message with Diane at office to call regarding the below since pt has not been scheduled yet.

## 2013-10-10 NOTE — Telephone Encounter (Signed)
I left message on pt voicemail to call me regarding the below appointment.

## 2013-10-17 ENCOUNTER — Other Ambulatory Visit: Payer: BC Managed Care – PPO

## 2013-10-17 DIAGNOSIS — E039 Hypothyroidism, unspecified: Secondary | ICD-10-CM

## 2013-10-17 LAB — TSH: TSH: 3.289 u[IU]/mL (ref 0.350–4.500)

## 2013-10-21 NOTE — Telephone Encounter (Signed)
Appointment 10/24/13 @ 1:45 pm

## 2013-10-24 ENCOUNTER — Other Ambulatory Visit: Payer: Self-pay | Admitting: Family Medicine

## 2013-10-24 ENCOUNTER — Encounter (INDEPENDENT_AMBULATORY_CARE_PROVIDER_SITE_OTHER): Payer: Self-pay

## 2013-10-24 ENCOUNTER — Ambulatory Visit (INDEPENDENT_AMBULATORY_CARE_PROVIDER_SITE_OTHER): Payer: BC Managed Care – PPO | Admitting: Neurology

## 2013-10-24 ENCOUNTER — Encounter: Payer: Self-pay | Admitting: Neurology

## 2013-10-24 VITALS — BP 130/88 | HR 83 | Ht 66.0 in | Wt 121.0 lb

## 2013-10-24 DIAGNOSIS — G500 Trigeminal neuralgia: Secondary | ICD-10-CM

## 2013-10-24 DIAGNOSIS — G35 Multiple sclerosis: Secondary | ICD-10-CM

## 2013-10-24 MED ORDER — INTERFERON BETA-1A 44 MCG/0.5ML ~~LOC~~ SOLN: 44.0000 ug | SUBCUTANEOUS | Status: DC

## 2013-10-24 MED ORDER — ELETRIPTAN HYDROBROMIDE 40 MG PO TABS: 40.0000 mg | ORAL_TABLET | ORAL | Status: DC | PRN

## 2013-10-24 MED ORDER — LAMOTRIGINE 150 MG PO TABS: 150.0000 mg | ORAL_TABLET | Freq: Every day | ORAL | Status: DC

## 2013-10-24 MED ORDER — AMITRIPTYLINE HCL 50 MG PO TABS: 50.0000 mg | ORAL_TABLET | Freq: Every day | ORAL | Status: DC

## 2013-10-24 MED ORDER — DIAZEPAM 5 MG PO TABS: 5.0000 mg | ORAL_TABLET | Freq: Four times a day (QID) | ORAL | Status: DC | PRN

## 2013-10-24 MED ORDER — CARBAMAZEPINE ER 400 MG PO TB12: 400.0000 mg | ORAL_TABLET | Freq: Three times a day (TID) | ORAL | Status: DC

## 2013-10-24 NOTE — Patient Instructions (Signed)
Overall you are doing fairly well but I do want to suggest a few things today:   Remember to drink plenty of fluid, eat healthy meals and do not skip any meals. Try to eat protein with a every meal and eat a healthy snack such as fruit or nuts in between meals. Try to keep a regular sleep-wake schedule and try to exercise daily, particularly in the form of walking, 20-30 minutes a day, if you can.   Continue on your tegretol and Rebif as prescribed  I would like to see you back in 6 months, sooner if we need to. Please call us with any interim questions, concerns, problems, updates or refill requests.   Please also call us for any test results so we can go over those with you on the phone.  My clinical assistant and will answer any of your questions and relay your messages to me and also relay most of my messages to you.   Our phone number is [phone]. We also have an after hours call service for urgent matters and there is a physician on-call for urgent questions. For any emergencies you know to call 911 or go to the nearest emergency room

## 2013-10-24 NOTE — Progress Notes (Signed)
[facility]    Provider:  Dr Hosie Poisson Referring Provider: Donita Brooks, MD Primary Care Physician:  Milinda Antis, MD  CC:  Trigeminal neuralgia and MS  HPI:  Melanie Richardson is a 63 y.o. female here as a referral from Dr. Tanya Nones for evaluation of MS and trigeminal neuralgia  Diagnosed with MS in late 1990s, first symptoms were in 1970s (had unilateral facial numbness). Had similar symptoms off and on since then. Developed trigeminal neuralgia in 1980s. Always on the right side. This has been the main MS symptom she has noticed. Occasionally when stressed/anxious will notice a numbness on right side of body which comes and goes. Currently taking Rebif for her MS, has been on this medication since 2005. Has not been on anything before this. Tolerating injections well, no adverse effects. Currently taking tegretol for trigeminal neuralgia, has been on this since 1996. Currently happy with her current medication regimen and control.   Her last MRI of the brain was in 2012. Had been following with Dr. Phillip Heal at that time. She has been following with Dr Phillip Heal up until last May. Patient is going through a difficult divorce and under a lot of stress/anxiety.   Review of Systems: Out of a complete 14 system review, the patient complains of only the following symptoms, and all other reviewed systems are negative. Positive fatigue anxiety  History   Social History  . Marital Status: Married    Spouse Name: N/A    Number of Children: 2  . Years of Education: college   Occupational History  .  Other   Social History Main Topics  . Smoking status: Current Every Day Smoker -- 1.00 packs/day    Types: Cigarettes  . Smokeless tobacco: Never Used  . Alcohol Use: Yes     Comment: rarely  . Drug Use: No  . Sexual Activity: Yes    Birth Control/ Protection: Post-menopausal   Other Topics Concern  . Not on file   Social History Narrative   Patient lives at home alone,  has 2 children   Patient is right handed   Educational level is some college   Caffeine consumption is 3 cups a day    Family History  Problem Relation Age of Onset  . Heart disease Mother   . Hypertension Father   . Cancer Father     colon  . Stroke Father   . Breast cancer Maternal Aunt 50  . Breast cancer Cousin 40    Past Medical History  Diagnosis Date  . MS (multiple sclerosis)   . Trigeminal neuralgia   . Hypertension   . Osteopenia 02/2009    t score -2.0  . Thyroid disease     Hypothyroid    Past Surgical History  Procedure Laterality Date  . Underarm surgery  1988  . Feet surgery  1990  . Elbow surgery  1999    shattered elbow  . Radiofrquency rhizotomy  [id number]  . Gamma knife  2005  . Back surgery  2011    Current Outpatient Prescriptions  Medication Sig Dispense Refill  . albuterol (PROVENTIL HFA;VENTOLIN HFA) 108 (90 BASE) MCG/ACT inhaler Inhale 2 puffs into the lungs every 6 (six) hours as needed for wheezing.  1 Inhaler  0  . amitriptyline (ELAVIL) 50 MG tablet       . aspirin 81 MG tablet Take 81 mg by mouth daily.      Marland Kitchen BYSTOLIC 10 MG tablet TAKE 1 TABLET BY  MOUTH DAILY  30 tablet  2  . carbamazepine (TEGRETOL XR) 400 MG 12 hr tablet Take 400 mg by mouth 3 (three) times daily.      . cholecalciferol (VITAMIN D) 1000 UNITS tablet Take 1,000 Units by mouth daily.      . clindamycin (CLEOCIN) 2 % vaginal cream Place 1 Applicatorful vaginally at bedtime.  40 g  0  . diazepam (VALIUM) 5 MG tablet Take 1 tablet (5 mg total) by mouth every 6 (six) hours as needed.  30 tablet  0  . fluticasone (FLONASE) 50 MCG/ACT nasal spray Place 2 sprays into the nose daily.  16 g  6  . Hydrocodone-Acetaminophen 7.5-300 MG TABS       . HYDROcodone-homatropine (HYCODAN) 5-1.5 MG/5ML syrup       . ibuprofen (ADVIL,MOTRIN) 200 MG tablet Take 200 mg by mouth every 6 (six) hours as needed.      . interferon beta-1a (REBIF) 44 MCG/0.5ML injection Inject 44 mcg into the  skin 3 (three) times a week.      . lamoTRIgine (LAMICTAL) 150 MG tablet Take 150 mg by mouth daily.      Marland Kitchen. levothyroxine (SYNTHROID, LEVOTHROID) 50 MCG tablet TAKE 1 TABLET BY MOUTH DAILY BEFORE BREAKFAST.  30 tablet  1  . losartan (COZAAR) 100 MG tablet TAKE 1 TABLET BY MOUTH EVERY DAY  30 tablet  5  . Multiple Vitamin (MULTIVITAMIN) tablet Take 1 tablet by mouth daily.      Marland Kitchen. oxyCODONE-acetaminophen (PERCOCET) 10-325 MG per tablet Take 1 tablet by mouth every 8 (eight) hours as needed for pain.  90 tablet  0  . predniSONE (STERAPRED UNI-PAK) 5 MG TABS tablet Take by mouth.      . pregabalin (LYRICA) 75 MG capsule Take 75 mg by mouth 2 (two) times daily.      . RELPAX 40 MG tablet TAKE 1 TABLET AT ONSET OF HEADACHE, MAY REPEAT IN 2 HOURS IF HEADACHE PERSISTS *MAX 2 PER 24 HOURS  12 tablet  0  . zolpidem (AMBIEN) 5 MG tablet Take 1 tablet (5 mg total) by mouth at bedtime as needed.  30 tablet  0   No current facility-administered medications for this visit.    Allergies as of 10/24/2013 - Review Complete 10/24/2013  Allergen Reaction Noted  . Ciprofloxacin    . Codeine    . Erythromycin      Vitals: BP 130/88  Pulse 83  Ht 5\' 6"  (1.676 m)  Wt 121 lb (54.885 kg)  BMI 19.54 kg/m2  LMP 10/06/1996 Last Weight:  Wt Readings from Last 1 Encounters:  10/24/13 121 lb (54.885 kg)   Last Height:   Ht Readings from Last 1 Encounters:  10/24/13 5\' 6"  (1.676 m)     Physical exam: Exam: Gen: NAD, conversant Eyes: anicteric sclerae, moist conjunctivae HENT: Atraumatic, oropharynx clear Neck: Trachea midline; supple,  Lungs: CTA, no wheezing, rales, rhonic                          CV: RRR, no MRG Abdomen: Soft, non-tender;  Extremities: No peripheral edema  Skin: Normal temperature, no rash,  Psych: Appropriate affect, pleasant  Neuro: MS: AA&Ox3, appropriately interactive, normal affect   Speech: fluent w/o paraphasic error  Memory: good recent and remote  recall  CN: PERRL, EOMI noted nystagmus with horizontal gaze bilaterally, no ptosis, minimally decreased LT in V2 on the right, face symmetric, no weakness, hearing grossly intact,  palate elevates symmetrically, shoulder shrug 5/5 bilat,  tongue protrudes midline, no fasiculations noted.  Motor: normal bulk and tone Strength: 5/5  In all extremities  Coord: rapid alternating and point-to-point (FNF, HTS) movements intact.  Reflexes: symmetrical, bilat downgoing toes  Sens: LT intact in all extremities  Gait: posture, stance, stride and arm-swing normal. Multiple missteps with tandem walking. Able to walk on heels and toes. Romberg absent.   Assessment:  After physical and neurologic examination, review of laboratory studies, imaging, neurophysiology testing and pre-existing records, assessment will be reviewed on the problem list.  Plan:  Treatment plan and additional workup will be reviewed under Problem List.  1)Multiple sclerosis 2)Trigeminal neuralgia  63 year old woman with history of multiple sclerosis which predominantly presents as a trigeminal neuralgia presenting for initial evaluation in our office. Patient has been followed by Dr. Phillip Heal for the past few years and is transitioning care. Patient has been well controlled on a regimen of treatment 3 times a week and Tegretol for symptomatic relief. Will continue patient his medications at that time. Patient wishes to hold off on imaging and lab work at this time. Will plan to repeat MRI brain and C-spine and lab work, including CBC and hepatic function in 6 months. Patient to followup earlier if needed. Refills of medications sent to pharmacy.   Elspeth Cho, DO  [facility] [address] [place], [place] [address]  Phone [phone] Fax [phone]

## 2013-10-24 NOTE — Telephone Encounter (Signed)
Meds refilled.

## 2013-10-25 ENCOUNTER — Other Ambulatory Visit: Payer: Self-pay | Admitting: Family Medicine

## 2013-10-25 ENCOUNTER — Telehealth: Payer: Self-pay | Admitting: Neurology

## 2013-10-25 MED ORDER — INTERFERON BETA-1A 44 MCG/0.5ML ~~LOC~~ SOLN: 44.0000 ug | SUBCUTANEOUS | Status: DC

## 2013-10-25 MED ORDER — LOSARTAN POTASSIUM 100 MG PO TABS: ORAL_TABLET | ORAL | Status: DC

## 2013-10-25 NOTE — Telephone Encounter (Signed)
Rx has been resent to [facility]

## 2013-10-25 NOTE — Telephone Encounter (Signed)
PT saw Dr. Hosie Poisson on 1/19.  He called in all her prescriptions to the [facility], however her Rebif Rx has to be submitted to [facility].  The fax number is [phone].  Please send to the corrected pharmacy.  Thank you.

## 2013-10-25 NOTE — Telephone Encounter (Signed)
Rx Refilled  

## 2013-11-05 ENCOUNTER — Other Ambulatory Visit: Payer: Self-pay | Admitting: Family Medicine

## 2013-11-10 ENCOUNTER — Other Ambulatory Visit: Payer: Self-pay | Admitting: Family Medicine

## 2013-12-07 ENCOUNTER — Telehealth: Payer: Self-pay | Admitting: Neurology

## 2013-12-07 NOTE — Telephone Encounter (Signed)
Dr Hosie Poisson is out of the office for the remainder of the day, I can send request to [facility].  I called and spoke with the patient.  She would like to wait until he returns tomorrow, indicates she does not want request sent to [facility], only wants Dr Hosie Poisson doing her Rx's.  Says she still has meds left.

## 2013-12-07 NOTE — Telephone Encounter (Signed)
Pt called is calling to get her refill for zolpidem (AMBIEN) 5 MG tablet &  diazepam (VALIUM) 5 MG tablet needs someone to c/b to confirm this has been called in.

## 2013-12-08 ENCOUNTER — Telehealth: Payer: Self-pay

## 2013-12-08 ENCOUNTER — Other Ambulatory Visit: Payer: Self-pay | Admitting: Neurology

## 2013-12-08 MED ORDER — ZOLPIDEM TARTRATE 5 MG PO TABS: 5.0000 mg | ORAL_TABLET | Freq: Every evening | ORAL | Status: DC | PRN

## 2013-12-08 MED ORDER — DIAZEPAM 5 MG PO TABS: 5.0000 mg | ORAL_TABLET | Freq: Four times a day (QID) | ORAL | Status: DC | PRN

## 2013-12-08 NOTE — Telephone Encounter (Signed)
Rx's have been entered, will just need MD signature.  Thank you.

## 2013-12-08 NOTE — Telephone Encounter (Signed)
That is fine. Thanks 

## 2013-12-08 NOTE — Telephone Encounter (Signed)
Patient is requesting refills on Diazepam and Ambien.  It does not appear we have prescribed Ambien before.  Would you like to refill both meds?  Please advise.  Thank you.

## 2013-12-09 ENCOUNTER — Telehealth: Payer: Self-pay | Admitting: Neurology

## 2013-12-09 NOTE — Telephone Encounter (Signed)
Patient needs refill on Zolpidem--[facility] [address]--thank you.Marland Kitchen

## 2013-12-12 ENCOUNTER — Other Ambulatory Visit: Payer: Self-pay | Admitting: Neurology

## 2014-01-06 ENCOUNTER — Other Ambulatory Visit: Payer: Self-pay | Admitting: Family Medicine

## 2014-01-19 ENCOUNTER — Other Ambulatory Visit: Payer: Self-pay | Admitting: Neurology

## 2014-01-19 NOTE — Telephone Encounter (Signed)
Dr Hosie PoissonSumner is out of the office, [facility]

## 2014-01-20 MED ORDER — DIAZEPAM 5 MG PO TABS: 5.0000 mg | ORAL_TABLET | Freq: Four times a day (QID) | ORAL | Status: DC | PRN

## 2014-01-23 ENCOUNTER — Other Ambulatory Visit: Payer: Self-pay | Admitting: Neurology

## 2014-01-23 NOTE — Telephone Encounter (Signed)
Dr Hosie Poisson is out of the office.  Forwarding to [facility] for approval.

## 2014-01-23 NOTE — Telephone Encounter (Signed)
Rx signed and faxed.

## 2014-01-24 ENCOUNTER — Telehealth: Payer: Self-pay | Admitting: Neurology

## 2014-01-24 NOTE — Telephone Encounter (Signed)
This Rx was already sent.  I called the pharmacy.  They said they did get the Rx yesterday, and it has been ready for pick up since then, but the patient has not been in.  They say the patient also has other meds ready for pick up as well.  I called the patient back.  She said she will follow up with the pharmacy and will call us back if needed.

## 2014-01-24 NOTE — Telephone Encounter (Signed)
Patient calling to make us aware that her pharmacy faxed over refill request for Ambien and that they haven't heard anything back from us yet.

## 2014-01-26 ENCOUNTER — Telehealth: Payer: Self-pay | Admitting: *Deleted

## 2014-01-26 NOTE — Telephone Encounter (Signed)
Left message that form was ready for pick up at front desk.

## 2014-03-04 ENCOUNTER — Other Ambulatory Visit: Payer: Self-pay | Admitting: Neurology

## 2014-03-06 ENCOUNTER — Encounter: Payer: Self-pay | Admitting: Family Medicine

## 2014-03-06 ENCOUNTER — Ambulatory Visit (INDEPENDENT_AMBULATORY_CARE_PROVIDER_SITE_OTHER): Payer: BC Managed Care – PPO | Admitting: Family Medicine

## 2014-03-06 VITALS — BP 140/86 | HR 86 | Temp 100.1°F | Resp 18 | Ht 66.0 in | Wt 127.0 lb

## 2014-03-06 DIAGNOSIS — J019 Acute sinusitis, unspecified: Secondary | ICD-10-CM

## 2014-03-06 MED ORDER — AZITHROMYCIN 250 MG PO TABS: ORAL_TABLET | ORAL | Status: DC

## 2014-03-06 NOTE — Progress Notes (Signed)
Subjective:    Patient ID: Melanie Richardson, female    DOB: 08/04/1951, 63 y.o.   MRN: [id number]  HPI Patient has been battling allergies for several weeks. Melanie Richardson's been using Flonase. Last week it became much worse. Melanie Richardson reports bilateral maxillary sinus congestion, postnasal drip, headache, fevers and chills. Melanie Richardson also reports a nonproductive cough.  Melanie Richardson continues to use Flonase without relief. Melanie Richardson denies any chest pain or shortness of breath. The cough is nonproductive. Melanie Richardson does report postnasal drip and a scratchy throat. Past Medical History  Diagnosis Date  . MS (multiple sclerosis)   . Trigeminal neuralgia   . Hypertension   . Osteopenia 02/2009    t score -2.0  . Thyroid disease     Hypothyroid   Current Outpatient Prescriptions on File Prior to Visit  Medication Sig Dispense Refill  . albuterol (PROVENTIL HFA;VENTOLIN HFA) 108 (90 BASE) MCG/ACT inhaler Inhale 2 puffs into the lungs every 6 (six) hours as needed for wheezing.  1 Inhaler  0  . amitriptyline (ELAVIL) 50 MG tablet Take 1 tablet (50 mg total) by mouth at bedtime.  90 tablet  3  . aspirin 81 MG tablet Take 81 mg by mouth daily.      Marland Kitchen. BYSTOLIC 10 MG tablet TAKE 1 TABLET BY MOUTH DAILY  30 tablet  3  . carbamazepine (TEGRETOL XR) 400 MG 12 hr tablet Take 1 tablet (400 mg total) by mouth 3 (three) times daily.  90 tablet  6  . cholecalciferol (VITAMIN D) 1000 UNITS tablet Take 1,000 Units by mouth daily.      . clindamycin (CLEOCIN) 2 % vaginal cream Place 1 Applicatorful vaginally at bedtime.  40 g  0  . diazepam (VALIUM) 5 MG tablet TAKE 1 TABLET EVERY 6 HOURS AS NEEDED  60 tablet  1  . eletriptan (RELPAX) 40 MG tablet Take 1 tablet (40 mg total) by mouth every 2 (two) hours as needed for migraine or headache. One tablet by mouth at onset of headache. May repeat in 2 hours if headache persists or recurs.  12 tablet  0  . fluticasone (FLONASE) 50 MCG/ACT nasal spray Place 2 sprays into the nose daily.  16 g  6  .  Hydrocodone-Acetaminophen 7.5-300 MG TABS       . ibuprofen (ADVIL,MOTRIN) 200 MG tablet Take 200 mg by mouth every 6 (six) hours as needed.      . interferon beta-1a (REBIF) 44 MCG/0.5ML injection Inject 0.5 mLs (44 mcg total) into the skin 3 (three) times a week.  6 mL  6  . levothyroxine (SYNTHROID, LEVOTHROID) 50 MCG tablet TAKE 1 TABLET BY MOUTH DAILY BEFORE BREAKFAST.  30 tablet  1  . losartan (COZAAR) 100 MG tablet TAKE 1 TABLET BY MOUTH EVERY DAY  30 tablet  5  . Multiple Vitamin (MULTIVITAMIN) tablet Take 1 tablet by mouth daily.      Marland Kitchen. oxyCODONE-acetaminophen (PERCOCET) 10-325 MG per tablet Take 1 tablet by mouth every 8 (eight) hours as needed for pain.  90 tablet  0  . pregabalin (LYRICA) 75 MG capsule Take 75 mg by mouth 2 (two) times daily.      Marland Kitchen. zolpidem (AMBIEN) 5 MG tablet TAKE 1 TABLET BY MOUTH AT BEDTIME AS NEEDED.  30 tablet  1   No current facility-administered medications on file prior to visit.   Allergies  Allergen Reactions  . Ciprofloxacin     Dizziness and headache  . Codeine  GI upset  . Erythromycin    History   Social History  . Marital Status: Married    Spouse Name: N/A    Number of Children: 2  . Years of Education: college   Occupational History  .  Other   Social History Main Topics  . Smoking status: Current Every Day Smoker -- 1.00 packs/day    Types: Cigarettes  . Smokeless tobacco: Never Used  . Alcohol Use: Yes     Comment: rarely  . Drug Use: No  . Sexual Activity: Yes    Birth Control/ Protection: Post-menopausal   Other Topics Concern  . Not on file   Social History Narrative   Patient lives at home alone, has 2 children   Patient is right handed   Educational level is some college   Caffeine consumption is 3 cups a day      Review of Systems  All other systems reviewed and are negative.      Objective:   Physical Exam  Vitals reviewed. Constitutional: Melanie Richardson appears well-developed and well-nourished. No  distress.  HENT:  Right Ear: Tympanic membrane, external ear and ear canal normal.  Left Ear: Tympanic membrane, external ear and ear canal normal.  Nose: Mucosal edema and rhinorrhea present. Right sinus exhibits no maxillary sinus tenderness and no frontal sinus tenderness. Left sinus exhibits no maxillary sinus tenderness and no frontal sinus tenderness.  Mouth/Throat: Oropharynx is clear and moist. No oropharyngeal exudate.  Eyes: Conjunctivae are normal.  Neck: Neck supple.  Cardiovascular: Normal rate, regular rhythm and normal heart sounds.   Pulmonary/Chest: Effort normal and breath sounds normal. No respiratory distress. Melanie Richardson has no wheezes. Melanie Richardson has no rales.  Lymphadenopathy:    Melanie Richardson has no cervical adenopathy.  Skin: Melanie Richardson is not diaphoretic.          Assessment & Plan:  1. Acute rhinosinusitis Believe the patient's seasonal allergic rhinosinusitis has developed into a secondary bacterial sinusitis.  I will start the patient on a Z-Pak. Asked her to continue to use Flonase. If symptoms worsen consider adding prednisone for her chronic sinusitis.  - azithromycin (ZITHROMAX) 250 MG tablet; 2 tabs poqday1, 1 tab poqday 2-5  Dispense: 6 tablet; Refill: 0

## 2014-03-06 NOTE — Telephone Encounter (Signed)
Rx signed and faxed.

## 2014-04-11 ENCOUNTER — Telehealth: Payer: Self-pay | Admitting: Neurology

## 2014-04-11 MED ORDER — PREDNISONE 10 MG PO TABS: 10.0000 mg | ORAL_TABLET | ORAL | Status: DC

## 2014-04-11 NOTE — Telephone Encounter (Signed)
Patient requesting refill of Prednisone, she is completely out, please call in to [facility] on [address] [phone].

## 2014-04-11 NOTE — Telephone Encounter (Signed)
I called patient. She indicates that she has been on prednisone 10 mg every other day for a number of years. She has been seeing Dr. Arsenio Loader since January of 2015, but she has had prednisone since that time up until the current date. I will call in another prescription for her.

## 2014-04-11 NOTE — Telephone Encounter (Signed)
It does not appear we have prescribed this med previously.  I called the patient back to verify info.  Got no answer.  Left message.

## 2014-04-11 NOTE — Telephone Encounter (Signed)
I called back and spoke with the patient.  She said her previous neurologist prescribed 10mg  prednisone for her to take on a regular basis.  She would like us to start prescribing this Rx.  Dr Hosie PoissonSumner is out of the office, [facility].

## 2014-04-20 NOTE — Telephone Encounter (Signed)
Pt is showing up in "Overdue Results" for TSH-- Call patient and tell her that she is actually due for a regular office visit.  we can do lab work at that visit.

## 2014-04-24 ENCOUNTER — Other Ambulatory Visit (INDEPENDENT_AMBULATORY_CARE_PROVIDER_SITE_OTHER): Payer: Self-pay

## 2014-04-24 ENCOUNTER — Ambulatory Visit (INDEPENDENT_AMBULATORY_CARE_PROVIDER_SITE_OTHER): Payer: BC Managed Care – PPO | Admitting: Neurology

## 2014-04-24 ENCOUNTER — Encounter: Payer: Self-pay | Admitting: Neurology

## 2014-04-24 VITALS — BP 126/75 | HR 74 | Ht 66.5 in | Wt 131.0 lb

## 2014-04-24 DIAGNOSIS — G500 Trigeminal neuralgia: Secondary | ICD-10-CM

## 2014-04-24 DIAGNOSIS — G35 Multiple sclerosis: Secondary | ICD-10-CM

## 2014-04-24 DIAGNOSIS — Z0289 Encounter for other administrative examinations: Secondary | ICD-10-CM

## 2014-04-24 MED ORDER — PREDNISONE 10 MG PO TABS: ORAL_TABLET | ORAL | Status: DC

## 2014-04-24 NOTE — Progress Notes (Signed)
[facility]    Provider:  Dr Hosie Poisson Referring Provider: Salley Scarlet, MD Primary Care Physician:  Milinda Antis, MD  CC:  Trigeminal neuralgia and MS  HPI:  Melanie Richardson is a 63 y.o. female here as a follow up from Dr. Jeanice Lim for evaluation of MS and trigeminal neuralgia  Notes flare up of the trigeminal neuralgia in April but calmed down after a few days. She has been under a lot of stress due to multiple family situations. She is currently noticing some paresthesias on her right side, notes these return with stress/anxiety and then go away when she calms down. Yesterday noted some weakness in her left knee, it wouldn't work as well, having difficulty walking, had sensation her leg was going to give out. This resolved after a few hours.   She reports that Dr Phillip Heal started her on oral prednisone for her trigeminal neuralgia years ago. She has started weaning herself off this. She also has been weaning herself off of the lamictal.   Initial visit 10/2013 Diagnosed with MS in late 1990s, first symptoms were in 1970s (had unilateral facial numbness). Had similar symptoms off and on since then. Developed trigeminal neuralgia in 1980s. Always on the right side. This has been the main MS symptom she has noticed. Occasionally when stressed/anxious will notice a numbness on right side of body which comes and goes. Currently taking Rebif for her MS, has been on this medication since 2005. Has not been on anything before this. Tolerating injections well, no adverse effects. Currently taking tegretol for trigeminal neuralgia, has been on this since 1996. Currently happy with her current medication regimen and control.   Her last MRI of the brain was in 2012. Had been following with Dr. Phillip Heal at that time. She has been following with Dr Phillip Heal up until last May. Patient is going through a difficult divorce and under a lot of stress/anxiety.   Review of Systems: Out of a complete  14 system review, the patient complains of only the following symptoms, and all other reviewed systems are negative. Positive fatigue anxiety  History   Social History  . Marital Status: Married    Spouse Name: N/A    Number of Children: 2  . Years of Education: college   Occupational History  .  Other   Social History Main Topics  . Smoking status: Current Every Day Smoker -- 1.00 packs/day    Types: Cigarettes  . Smokeless tobacco: Never Used  . Alcohol Use: Yes     Comment: rarely  . Drug Use: No  . Sexual Activity: Yes    Birth Control/ Protection: Post-menopausal   Other Topics Concern  . Not on file   Social History Narrative   Patient lives at home alone, has 2 children   Patient is right handed   Educational level is some college   Caffeine consumption is 3 cups a day    Family History  Problem Relation Age of Onset  . Heart disease Mother   . Hypertension Father   . Cancer Father     colon  . Stroke Father   . Breast cancer Maternal Aunt 50  . Breast cancer Cousin 40    Past Medical History  Diagnosis Date  . MS (multiple sclerosis)   . Trigeminal neuralgia   . Hypertension   . Osteopenia 02/2009    t score -2.0  . Thyroid disease     Hypothyroid    Past Surgical History  Procedure Laterality Date  . Underarm surgery  1988  . Feet surgery  1990  . Elbow surgery  1999    shattered elbow  . Radiofrquency rhizotomy  [id number]  . Gamma knife  2005  . Back surgery  2011    Current Outpatient Prescriptions  Medication Sig Dispense Refill  . albuterol (PROVENTIL HFA;VENTOLIN HFA) 108 (90 BASE) MCG/ACT inhaler Inhale 2 puffs into the lungs every 6 (six) hours as needed for wheezing.  1 Inhaler  0  . amitriptyline (ELAVIL) 50 MG tablet Take 1 tablet (50 mg total) by mouth at bedtime.  90 tablet  3  . aspirin 81 MG tablet Take 81 mg by mouth daily.      Marland Kitchen azithromycin (ZITHROMAX) 250 MG tablet 2 tabs poqday1, 1 tab poqday 2-5  6 tablet  0  .  BYSTOLIC 10 MG tablet TAKE 1 TABLET BY MOUTH DAILY  30 tablet  3  . carbamazepine (TEGRETOL XR) 400 MG 12 hr tablet Take 1 tablet (400 mg total) by mouth 3 (three) times daily.  90 tablet  6  . cholecalciferol (VITAMIN D) 1000 UNITS tablet Take 1,000 Units by mouth daily.      . diazepam (VALIUM) 5 MG tablet TAKE 1 TABLET EVERY 6 HOURS AS NEEDED  60 tablet  1  . eletriptan (RELPAX) 40 MG tablet Take 1 tablet (40 mg total) by mouth every 2 (two) hours as needed for migraine or headache. One tablet by mouth at onset of headache. May repeat in 2 hours if headache persists or recurs.  12 tablet  0  . fluticasone (FLONASE) 50 MCG/ACT nasal spray Place 2 sprays into the nose daily.  16 g  6  . Hydrocodone-Acetaminophen 7.5-300 MG TABS       . ibuprofen (ADVIL,MOTRIN) 200 MG tablet Take 200 mg by mouth every 6 (six) hours as needed.      . interferon beta-1a (REBIF) 44 MCG/0.5ML injection Inject 0.5 mLs (44 mcg total) into the skin 3 (three) times a week.  6 mL  6  . lamoTRIgine (LAMICTAL) 150 MG tablet Take 75 mg by mouth. 1/2 TAB DAILY      . losartan (COZAAR) 100 MG tablet TAKE 1 TABLET BY MOUTH EVERY DAY  30 tablet  5  . Multiple Vitamin (MULTIVITAMIN) tablet Take 1 tablet by mouth daily.      Marland Kitchen oxyCODONE-acetaminophen (PERCOCET) 10-325 MG per tablet Take 1 tablet by mouth every 8 (eight) hours as needed for pain.  90 tablet  0  . predniSONE (DELTASONE) 10 MG tablet Take 10 mg by mouth. 1/4 TAB THREE TIMES DAILY      . pregabalin (LYRICA) 75 MG capsule Take 75 mg by mouth 2 (two) times daily.      Marland Kitchen zolpidem (AMBIEN) 5 MG tablet TAKE 1 TABLET BY MOUTH AT BEDTIME AS NEEDED.  30 tablet  1   No current facility-administered medications for this visit.    Allergies as of 04/24/2014 - Review Complete 04/24/2014  Allergen Reaction Noted  . Ciprofloxacin    . Codeine    . Erythromycin      Vitals: BP 126/75  Pulse 74  Ht 5' 6.5" (1.689 m)  Wt 131 lb (59.421 kg)  BMI 20.83 kg/m2  LMP  10/06/1996 Last Weight:  Wt Readings from Last 1 Encounters:  04/24/14 131 lb (59.421 kg)   Last Height:   Ht Readings from Last 1 Encounters:  04/24/14 5' 6.5" (1.689 m)  Physical exam: Exam: Gen: NAD, conversant Eyes: anicteric sclerae, moist conjunctivae HENT: Atraumatic, oropharynx clear Neck: Trachea midline; supple,  Lungs: CTA, no wheezing, rales, rhonic                          CV: RRR, no MRG Abdomen: Soft, non-tender;  Extremities: No peripheral edema  Skin: Normal temperature, no rash,  Psych: Appropriate affect, pleasant  Neuro: MS: AA&Ox3, appropriately interactive, normal affect   Speech: fluent w/o paraphasic error  Memory: good recent and remote recall  CN: PERRL, EOMI noted nystagmus with horizontal gaze bilaterally, no ptosis, minimally decreased LT in V2 on the right, face symmetric, no weakness, hearing grossly intact, palate elevates symmetrically, shoulder shrug 5/5 bilat,  tongue protrudes midline, no fasiculations noted.  Motor: normal bulk and tone Strength: 5/5  In all extremities  Coord: rapid alternating and point-to-point (FNF, HTS) movements intact.  Reflexes: symmetrical, bilat downgoing toes  Sens: LT intact in all extremities  Gait: posture, stance, stride and arm-swing normal. Multiple missteps with tandem walking. Able to walk on heels and toes. Romberg absent.   Assessment:  After physical and neurologic examination, review of laboratory studies, imaging, neurophysiology testing and pre-existing records, assessment will be reviewed on the problem list.  Plan:  Treatment plan and additional workup will be reviewed under Problem List.  1)Multiple sclerosis 2)Trigeminal neuralgia  63 year old woman with history of multiple sclerosis which predominantly presents as a trigeminal neuralgia presenting for initial evaluation in our office. Patient has been followed by Dr. Phillip HealSadr for the past few years and has transitioned care to our  office. Patient has been well controlled on rebif 3 times a week and Tegretol for symptomatic relief. Of note Dr Phillip HealSadr also had started her on prednisone years ago for her trigeminal neuralgia. She has been self tapering this down but does not wish to stop. Counseled her on risks of long term steroid usage, she expressed understanding but wishes to continue as she states it controls her TN flare ups. Discussed repeating MRI brain and C spine to check for disease progression but she wishes to wait at this time. Exam appears clinically stable so will hold off on repeat imaging. Will check CBC and CMP. Follow up in 6 months.   Elspeth ChoPeter Efrata Brunner, DO  [facility] [address] [place], [place] [id number]  Phone [phone] Fax [phone]

## 2014-04-24 NOTE — Patient Instructions (Signed)
Overall you are doing fairly well but I do want to suggest a few things today:   Remember to drink plenty of fluid, eat healthy meals and do not skip any meals. Try to eat protein with a every meal and eat a healthy snack such as fruit or nuts in between meals. Try to keep a regular sleep-wake schedule and try to exercise daily, particularly in the form of walking, 20-30 minutes a day, if you can.   As far as your medications are concerned, I would like to suggest not making any changes at this time  As far as diagnostic testing:  1)Please have some blood work completed today  I would like to see you back in 6 months, sooner if we need to. Please call us with any interim questions, concerns, problems, updates or refill requests.   My clinical assistant and will answer any of your questions and relay your messages to me and also relay most of my messages to you.   Our phone number is [phone]. We also have an after hours call service for urgent matters and there is a physician on-call for urgent questions. For any emergencies you know to call 911 or go to the nearest emergency room

## 2014-04-25 LAB — CBC
HEMATOCRIT: 37.5 % (ref 34.0–46.6)
HEMOGLOBIN: 13 g/dL (ref 11.1–15.9)
MCH: 33.1 pg — ABNORMAL HIGH (ref 26.6–33.0)
MCHC: 34.7 g/dL (ref 31.5–35.7)
MCV: 95 fL (ref 79–97)
Platelets: 204 10*3/uL (ref 150–379)
RBC: 3.93 x10E6/uL (ref 3.77–5.28)
RDW: 13 % (ref 12.3–15.4)
WBC: 7.3 10*3/uL (ref 3.4–10.8)

## 2014-04-25 LAB — COMPREHENSIVE METABOLIC PANEL
A/G RATIO: 1.6 (ref 1.1–2.5)
ALK PHOS: 68 IU/L (ref 39–117)
ALT: 26 IU/L (ref 0–32)
AST: 29 IU/L (ref 0–40)
Albumin: 4 g/dL (ref 3.6–4.8)
BUN / CREAT RATIO: 18 (ref 11–26)
BUN: 12 mg/dL (ref 8–27)
CO2: 24 mmol/L (ref 18–29)
CREATININE: 0.68 mg/dL (ref 0.57–1.00)
Calcium: 8.8 mg/dL (ref 8.7–10.3)
Chloride: 97 mmol/L (ref 97–108)
GFR calc Af Amer: 108 mL/min/{1.73_m2} (ref >59)
GFR, EST NON AFRICAN AMERICAN: 93 mL/min/{1.73_m2} (ref >59)
GLOBULIN, TOTAL: 2.5 g/dL (ref 1.5–4.5)
Glucose: 124 mg/dL — ABNORMAL HIGH (ref 65–99)
Potassium: 3.9 mmol/L (ref 3.5–5.2)
SODIUM: 137 mmol/L (ref 134–144)
Total Bilirubin: <0.2 mg/dL (ref 0.0–1.2)
Total Protein: 6.5 g/dL (ref 6.0–8.5)

## 2014-04-27 ENCOUNTER — Other Ambulatory Visit: Payer: Self-pay | Admitting: Family Medicine

## 2014-04-27 ENCOUNTER — Telehealth: Payer: Self-pay | Admitting: *Deleted

## 2014-04-27 NOTE — Telephone Encounter (Signed)
Message copied by Ardeth Sportsman on Thu Apr 27, 2014  1:24 PM ------      Message from: Ramond Marrow      Created: Tue Apr 25, 2014  3:43 PM       Please let her know her labs look good overall ------

## 2014-04-27 NOTE — Telephone Encounter (Signed)
Left a message stating that her labs labs were overall good. Any questions patient is to call the office.

## 2014-05-06 DIAGNOSIS — M8580 Other specified disorders of bone density and structure, unspecified site: Secondary | ICD-10-CM

## 2014-05-06 HISTORY — DX: Other specified disorders of bone density and structure, unspecified site: M85.80

## 2014-06-01 ENCOUNTER — Ambulatory Visit (INDEPENDENT_AMBULATORY_CARE_PROVIDER_SITE_OTHER): Payer: BC Managed Care – PPO | Admitting: Gynecology

## 2014-06-01 ENCOUNTER — Encounter: Payer: Self-pay | Admitting: Gynecology

## 2014-06-01 VITALS — BP 122/80 | Ht 65.0 in | Wt 128.0 lb

## 2014-06-01 DIAGNOSIS — Z01419 Encounter for gynecological examination (general) (routine) without abnormal findings: Secondary | ICD-10-CM

## 2014-06-01 DIAGNOSIS — M8580 Other specified disorders of bone density and structure, unspecified site: Secondary | ICD-10-CM

## 2014-06-01 DIAGNOSIS — N952 Postmenopausal atrophic vaginitis: Secondary | ICD-10-CM

## 2014-06-01 DIAGNOSIS — M899 Disorder of bone, unspecified: Secondary | ICD-10-CM

## 2014-06-01 DIAGNOSIS — M949 Disorder of cartilage, unspecified: Secondary | ICD-10-CM

## 2014-06-01 NOTE — Patient Instructions (Signed)
Schedule colonoscopy with [facility] at [phone] or [facility] gastroenterology at [phone]  Followup for your bone density as scheduled  Call to Schedule your mammogram  Facilities in [place]: 1)  [facility] [address] Phone: [phone] 2)  [facility] [place] [address] [employer] [place] Phone: [phone] 3)  Dr. Isaiah Blakes at [facility] [address] Phone: [phone]     Mammogram A mammogram is an X-ray test to find changes in a woman's breast. You should get a mammogram if:  You are 63 years of age or older  You have risk factors.   Your doctor recommends that you have one.  BEFORE THE TEST  Do not schedule the test the week before your period, especially if your breasts are sore during this time.  On the day of your mammogram:  Wash your breasts and armpits well. After washing, do not put on any deodorant or talcum powder on until after your test.   Eat and drink as you usually do.   Take your medicines as usual.   If you are diabetic and take insulin, make sure you:   Eat before coming for your test.   Take your insulin as usual.   If you cannot keep your appointment, call before the appointment to cancel. Schedule another appointment.  TEST  You will need to undress from the waist up. You will put on a hospital gown.   Your breast will be put on the mammogram machine, and it will press firmly on your breast with a piece of plastic called a compression paddle. This will make your breast flatter so that the machine can X-ray all parts of your breast.   Both breasts will be X-rayed. Each breast will be X-rayed from above and from the side. An X-ray might need to be taken again if the picture is not good enough.   The mammogram will last about 15 to 30 minutes.  AFTER THE TEST Finding out the results of your test Ask when your test results will be  ready. Make sure you get your test results.  Document Released: 12/19/2008 Document Revised: 09/11/2011 Document Reviewed: 12/19/2008 [facility] Patient Information 2012 [place].  You may obtain a copy of any labs that were done today by logging onto MyChart as outlined in the instructions provided with your AVS (after visit summary). The office will not call with normal lab results but certainly if there are any significant abnormalities then we will contact you.   Health Maintenance, Female A healthy lifestyle and preventative care can promote health and wellness.  Maintain regular health, dental, and eye exams.  Eat a healthy diet. Foods like vegetables, fruits, whole grains, low-fat dairy products, and lean protein foods contain the nutrients you need without too many calories. Decrease your intake of foods high in solid fats, added sugars, and salt. Get information about a proper diet from your caregiver, if necessary.  Regular physical exercise is one of the most important things you can do for your health. Most adults should get at least 150 minutes of moderate-intensity exercise (any activity that increases your heart rate and causes you to sweat) each week. In addition, most adults need muscle-strengthening exercises on 2 or more days a week.   Maintain a healthy weight. The body mass index (BMI) is a screening tool to identify possible weight problems. It provides an estimate of body fat based on height  and weight. Your caregiver can help determine your BMI, and can help you achieve or maintain a healthy weight. For adults 20 years and older:  A BMI below 18.5 is considered underweight.  A BMI of 18.5 to 24.9 is normal.  A BMI of 25 to 29.9 is considered overweight.  A BMI of 30 and above is considered obese.  Maintain normal blood lipids and cholesterol by exercising and minimizing your intake of saturated fat. Eat a balanced diet with plenty of fruits and vegetables.  Blood tests for lipids and cholesterol should begin at age 95 and be repeated every 5 years. If your lipid or cholesterol levels are high, you are over 50, or you are a high risk for heart disease, you may need your cholesterol levels checked more frequently.Ongoing high lipid and cholesterol levels should be treated with medicines if diet and exercise are not effective.  If you smoke, find out from your caregiver how to quit. If you do not use tobacco, do not start.  Lung cancer screening is recommended for adults aged 31 80 years who are at high risk for developing lung cancer because of a history of smoking. Yearly low-dose computed tomography (CT) is recommended for people who have at least a 30-pack-year history of smoking and are a current smoker or have quit within the past 15 years. A pack year of smoking is smoking an average of 1 pack of cigarettes a day for 1 year (for example: 1 pack a day for 30 years or 2 packs a day for 15 years). Yearly screening should continue until the smoker has stopped smoking for at least 15 years. Yearly screening should also be stopped for people who develop a health problem that would prevent them from having lung cancer treatment.  If you are pregnant, do not drink alcohol. If you are breastfeeding, be very cautious about drinking alcohol. If you are not pregnant and choose to drink alcohol, do not exceed 1 drink per day. One drink is considered to be 12 ounces (355 mL) of beer, 5 ounces (148 mL) of wine, or 1.5 ounces (44 mL) of liquor.  Avoid use of street drugs. Do not share needles with anyone. Ask for help if you need support or instructions about stopping the use of drugs.  High blood pressure causes heart disease and increases the risk of stroke. Blood pressure should be checked at least every 1 to 2 years. Ongoing high blood pressure should be treated with medicines, if weight loss and exercise are not effective.  If you are 73 to 63 years old, ask your  caregiver if you should take aspirin to prevent strokes.  Diabetes screening involves taking a blood sample to check your fasting blood sugar level. This should be done once every 3 years, after age 69, if you are within normal weight and without risk factors for diabetes. Testing should be considered at a younger age or be carried out more frequently if you are overweight and have at least 1 risk factor for diabetes.  Breast cancer screening is essential preventative care for women. You should practice "breast self-awareness." This means understanding the normal appearance and feel of your breasts and may include breast self-examination. Any changes detected, no matter how small, should be reported to a caregiver. Women in their 39s and 30s should have a clinical breast exam (CBE) by a caregiver as part of a regular health exam every 1 to 3 years. After age 75, women should have a  CBE every year. Starting at age 42, women should consider having a mammogram (breast X-ray) every year. Women who have a family history of breast cancer should talk to their caregiver about genetic screening. Women at a high risk of breast cancer should talk to their caregiver about having an MRI and a mammogram every year.  Breast cancer gene (BRCA)-related cancer risk assessment is recommended for women who have family members with BRCA-related cancers. BRCA-related cancers include breast, ovarian, tubal, and peritoneal cancers. Having family members with these cancers may be associated with an increased risk for harmful changes (mutations) in the breast cancer genes BRCA1 and BRCA2. Results of the assessment will determine the need for genetic counseling and BRCA1 and BRCA2 testing.  The Pap test is a screening test for cervical cancer. Women should have a Pap test starting at age 4. Between ages 9 and 28, Pap tests should be repeated every 2 years. Beginning at age 38, you should have a Pap test every 3 years as long as the  past 3 Pap tests have been normal. If you had a hysterectomy for a problem that was not cancer or a condition that could lead to cancer, then you no longer need Pap tests. If you are between ages 63 and 3, and you have had normal Pap tests going back 10 years, you no longer need Pap tests. If you have had past treatment for cervical cancer or a condition that could lead to cancer, you need Pap tests and screening for cancer for at least 20 years after your treatment. If Pap tests have been discontinued, risk factors (such as a new sexual partner) need to be reassessed to determine if screening should be resumed. Some women have medical problems that increase the chance of getting cervical cancer. In these cases, your caregiver may recommend more frequent screening and Pap tests.  The human papillomavirus (HPV) test is an additional test that may be used for cervical cancer screening. The HPV test looks for the virus that can cause the cell changes on the cervix. The cells collected during the Pap test can be tested for HPV. The HPV test could be used to screen women aged 64 years and older, and should be used in women of any age who have unclear Pap test results. After the age of 52, women should have HPV testing at the same frequency as a Pap test.  Colorectal cancer can be detected and often prevented. Most routine colorectal cancer screening begins at the age of 60 and continues through age 63. However, your caregiver may recommend screening at an earlier age if you have risk factors for colon cancer. On a yearly basis, your caregiver may provide home test kits to check for hidden blood in the stool. Use of a small camera at the end of a tube, to directly examine the colon (sigmoidoscopy or colonoscopy), can detect the earliest forms of colorectal cancer. Talk to your caregiver about this at age 31, when routine screening begins. Direct examination of the colon should be repeated every 5 to 10 years through  age 2, unless early forms of pre-cancerous polyps or small growths are found.  Hepatitis C blood testing is recommended for all people born from [birth year] through 1965 and any individual with known risks for hepatitis C.  Practice safe sex. Use condoms and avoid high-risk sexual practices to reduce the spread of sexually transmitted infections (STIs). Sexually active women aged 61 and younger should be checked for Chlamydia, which  is a common sexually transmitted infection. Older women with new or multiple partners should also be tested for Chlamydia. Testing for other STIs is recommended if you are sexually active and at increased risk.  Osteoporosis is a disease in which the bones lose minerals and strength with aging. This can result in serious bone fractures. The risk of osteoporosis can be identified using a bone density scan. Women ages 14 and over and women at risk for fractures or osteoporosis should discuss screening with their caregivers. Ask your caregiver whether you should be taking a calcium supplement or vitamin D to reduce the rate of osteoporosis.  Menopause can be associated with physical symptoms and risks. Hormone replacement therapy is available to decrease symptoms and risks. You should talk to your caregiver about whether hormone replacement therapy is right for you.  Use sunscreen. Apply sunscreen liberally and repeatedly throughout the day. You should seek shade when your shadow is shorter than you. Protect yourself by wearing long sleeves, pants, a wide-brimmed hat, and sunglasses year round, whenever you are outdoors.  Notify your caregiver of new moles or changes in moles, especially if there is a change in shape or color. Also notify your caregiver if a mole is larger than the size of a pencil eraser.  Stay current with your immunizations. Document Released: 04/07/2011 Document Revised: 01/17/2013 Document Reviewed: 04/07/2011 [facility] Patient Information 2014 [place].

## 2014-06-01 NOTE — Progress Notes (Signed)
Melanie Richardson December 02, 1950 [id number]        63 y.o.  G2P2 for annual exam.  Several issues noted below.  Past medical history,surgical history, problem list, medications, allergies, family history and social history were all reviewed and documented as reviewed in the EPIC chart.  ROS:  12 system ROS performed with pertinent positives and negatives included in the history, assessment and plan.   Additional significant findings :  None   Exam: [name] person Vitals:   06/01/14 1420  BP: 122/80  Height:  (1.651 m)  Weight: 128 lb (58.06 kg)   General appearance:  Normal affect, orientation and appearance. Skin: Grossly normal HEENT: Without gross lesions.  No cervical or supraclavicular adenopathy. Thyroid normal.  Lungs:  Clear without wheezing, rales or rhonchi Cardiac: RR, without RMG Abdominal:  Soft, nontender, without masses, guarding, rebound, organomegaly or hernia Breasts:  Examined lying and sitting without masses, retractions, discharge or axillary adenopathy. Pelvic:  Ext/BUS/vagina with generalized atrophic changes.  Cervix with atrophic changes.  Uterus anteverted, normal size, shape and contour, midline and mobile nontender   Adnexa  Without masses or tenderness    Anus and perineum  Normal   Rectovaginal  Normal sphincter tone without palpated masses or tenderness.    Assessment/Plan:  63 y.o. G2P2 female for annual exam.   1. Postmenopausal/atrophic genital changes. Patient without significant symptoms of hot flushes, night sweats, vaginal dryness or dyspareunia. No vaginal bleeding. Continue to monitor and report any vaginal bleeding. 2. Osteopenia. DEXA 2010 with T score -2.0. Has been on chronic steroids. Never followed up for DEXA as I have recommended multiple times in the past. I again strongly recommended she schedule a DEXA and she agrees to do so. Increase calcium vitamin D reviewed. 3. Pap smear/HPV negative 2014. No Pap smear done today. Will  plan repeat at 3-5 year interval per current screening guidelines. No history of abnormal Pap smears previously. 4. Mammography 01/2012. Patient knows she is way overdue and I strongly recommended that she schedule a mammogram and she agrees to do so. SBE monthly reviewed. 5. Colonoscopy never. I again strongly recommended she schedule a screening colonoscopy. Benefits of early detection and precancerous polyp removal discussed. Patient states that she will do that this year. 6. Marital discord. Patient continues to have issues with her marriage. We discussed this in the past and she has made no significant progress in resolving her issues. 7. Health maintenance. No blood work done as this is done at her primary physician's office. Followup for her DEXA, mammogram and colonoscopy. Followup in one year, sooner if any issues.   Note: This document was prepared with digital dictation and possible smart phrase technology. Any transcriptional errors that result from this process are unintentional.   [name], 2:48 PM 06/01/2014

## 2014-06-06 ENCOUNTER — Ambulatory Visit (INDEPENDENT_AMBULATORY_CARE_PROVIDER_SITE_OTHER): Payer: BC Managed Care – PPO

## 2014-06-06 ENCOUNTER — Telehealth: Payer: Self-pay | Admitting: Gynecology

## 2014-06-06 DIAGNOSIS — M8580 Other specified disorders of bone density and structure, unspecified site: Secondary | ICD-10-CM

## 2014-06-06 DIAGNOSIS — M949 Disorder of cartilage, unspecified: Secondary | ICD-10-CM

## 2014-06-06 DIAGNOSIS — M899 Disorder of bone, unspecified: Secondary | ICD-10-CM

## 2014-06-06 NOTE — Telephone Encounter (Signed)
Tell patient her bone density does show some osteopenia. Calculated fracture risk is elevated. Recommend office visit to discuss possible treatment options.

## 2014-06-07 NOTE — Telephone Encounter (Signed)
Pt informed with the below note, pt will call back to schedule.  

## 2014-06-13 ENCOUNTER — Telehealth: Payer: Self-pay | Admitting: Neurology

## 2014-06-13 MED ORDER — CARBAMAZEPINE ER 400 MG PO TB12: 400.0000 mg | ORAL_TABLET | Freq: Three times a day (TID) | ORAL | Status: DC

## 2014-06-13 NOTE — Telephone Encounter (Signed)
Patient requesting refill of Tegretol XR 400 which was previously prescribed by another neurologist but she has switched to Dr. Hosie Poisson.  Patient saw Dr. Hosie Poisson in July. [facility] [address].  Her best call back is [phone]

## 2014-06-13 NOTE — Telephone Encounter (Signed)
Rx has been sent.  I called the patient back.  Got no answer, left message. 

## 2014-06-23 ENCOUNTER — Encounter: Payer: Self-pay | Admitting: Gynecology

## 2014-06-23 ENCOUNTER — Ambulatory Visit (INDEPENDENT_AMBULATORY_CARE_PROVIDER_SITE_OTHER): Payer: BC Managed Care – PPO | Admitting: Gynecology

## 2014-06-23 DIAGNOSIS — M899 Disorder of bone, unspecified: Secondary | ICD-10-CM

## 2014-06-23 DIAGNOSIS — M8580 Other specified disorders of bone density and structure, unspecified site: Secondary | ICD-10-CM

## 2014-06-23 DIAGNOSIS — M949 Disorder of cartilage, unspecified: Secondary | ICD-10-CM

## 2014-06-23 MED ORDER — ALENDRONATE SODIUM 70 MG PO TABS: 70.0000 mg | ORAL_TABLET | ORAL | Status: DC

## 2014-06-23 NOTE — Patient Instructions (Signed)
Start the alendronate (Fosamax) as we discussed. Call me if you have any issues with this.  Alendronate tablets What is this medicine? ALENDRONATE (a LEN droe nate) slows calcium loss from bones. It helps to make normal healthy bone and to slow bone loss in people with Paget's disease and osteoporosis. It may be used in others at risk for bone loss. This medicine may be used for other purposes; ask your health care provider or pharmacist if you have questions. COMMON BRAND NAME(S): Fosamax What should I tell my health care provider before I take this medicine? They need to know if you have any of these conditions: -dental disease -esophagus, stomach, or intestine problems, like acid reflux or GERD -kidney disease -low blood calcium -low vitamin D -problems sitting or standing 30 minutes -trouble swallowing -an unusual or allergic reaction to alendronate, other medicines, foods, dyes, or preservatives -pregnant or trying to get pregnant -breast-feeding How should I use this medicine? You must take this medicine exactly as directed or you will lower the amount of the medicine you absorb into your body or you may cause yourself harm. Take this medicine by mouth first thing in the morning, after you are up for the day. Do not eat or drink anything before you take your medicine. Swallow the tablet with a full glass (6 to 8 fluid ounces) of plain water. Do not take this medicine with any other drink. Do not chew or crush the tablet. After taking this medicine, do not eat breakfast, drink, or take any medicines or vitamins for at least 30 minutes. Sit or stand up for at least 30 minutes after you take this medicine; do not lie down. Do not take your medicine more often than directed. Talk to your pediatrician regarding the use of this medicine in children. Special care may be needed. Overdosage: If you think you have taken too much of this medicine contact a poison control center or emergency room at  once. NOTE: This medicine is only for you. Do not share this medicine with others. What if I miss a dose? If you miss a dose, do not take it later in the day. Continue your normal schedule starting the next morning. Do not take double or extra doses. What may interact with this medicine? -aluminum hydroxide -antacids -aspirin -calcium supplements -drugs for inflammation like ibuprofen, naproxen, and others -iron supplements -magnesium supplements -vitamins with minerals This list may not describe all possible interactions. Give your health care provider a list of all the medicines, herbs, non-prescription drugs, or dietary supplements you use. Also tell them if you smoke, drink alcohol, or use illegal drugs. Some items may interact with your medicine. What should I watch for while using this medicine? Visit your doctor or health care professional for regular checks ups. It may be some time before you see benefit from this medicine. Do not stop taking your medicine except on your doctor's advice. Your doctor or health care professional may order blood tests and other tests to see how you are doing. You should make sure you get enough calcium and vitamin D while you are taking this medicine, unless your doctor tells you not to. Discuss the foods you eat and the vitamins you take with your health care professional. Some people who take this medicine have severe bone, joint, and/or muscle pain. This medicine may also increase your risk for a broken thigh bone. Tell your doctor right away if you have pain in your upper leg or groin.  Tell your doctor if you have any pain that does not go away or that gets worse. This medicine can make you more sensitive to the sun. If you get a rash while taking this medicine, sunlight may cause the rash to get worse. Keep out of the sun. If you cannot avoid being in the sun, wear protective clothing and use sunscreen. Do not use sun lamps or tanning beds/booths. What  side effects may I notice from receiving this medicine? Side effects that you should report to your doctor or health care professional as soon as possible: -allergic reactions like skin rash, itching or hives, swelling of the face, lips, or tongue -black or tarry stools -bone, muscle or joint pain -changes in vision -chest pain -heartburn or stomach pain -jaw pain, especially after dental work -pain or trouble when swallowing -redness, blistering, peeling or loosening of the skin, including inside the mouth Side effects that usually do not require medical attention (report to your doctor or health care professional if they continue or are bothersome): -changes in taste -diarrhea or constipation -eye pain or itching -headache -nausea or vomiting -stomach gas or fullness This list may not describe all possible side effects. Call your doctor for medical advice about side effects. You may report side effects to FDA at 1-800-FDA-1088. Where should I keep my medicine? Keep out of the reach of children. Store at room temperature of 15 and 30 degrees C (59 and 86 degrees F). Throw away any unused medicine after the expiration date. NOTE: This sheet is a summary. It may not cover all possible information. If you have questions about this medicine, talk to your doctor, pharmacist, or health care provider.  2015, Elsevier/[employer] Standard. (2011-03-21 08:56:09)
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
## 2014-06-26 ENCOUNTER — Other Ambulatory Visit: Payer: Self-pay | Admitting: Neurology

## 2014-06-28 ENCOUNTER — Telehealth: Payer: Self-pay | Admitting: Neurology

## 2014-06-28 MED ORDER — INTERFERON BETA-1A 44 MCG/0.5ML ~~LOC~~ SOLN: 44.0000 ug | SUBCUTANEOUS | Status: DC

## 2014-06-28 NOTE — Telephone Encounter (Addendum)
Pt called back about the refill request.  Wanting to make sure that [facility] would send the rebif [facility] [address]  Pt # [phone].

## 2014-06-28 NOTE — Telephone Encounter (Signed)
Called pt to inform her that her Rx was [facility] and if she has any other problems, questions or concerns to call the office. Pt verbalized understanding.

## 2014-06-28 NOTE — Telephone Encounter (Signed)
Patient requesting Rx refill for interferon beta-1a (REBIF) 44 MCG/0.5ML injection sent to [facility].  Please call anytime and advise, may leave detailed message on voicemail if not available.

## 2014-06-28 NOTE — Telephone Encounter (Signed)
Called pt and assured pt that her Rx was sent to [facility]. I advised the pt that if she has any other problems, questions or concerns to call the office. Pt verbalized understanding.

## 2014-06-30 ENCOUNTER — Other Ambulatory Visit: Payer: Self-pay | Admitting: Neurology

## 2014-06-30 ENCOUNTER — Telehealth: Payer: Self-pay | Admitting: Neurology

## 2014-06-30 NOTE — Telephone Encounter (Addendum)
Spoke to Melanie Richardson with [facility] and with [facility], after speaking to Dr. Hosie Poisson I gave the pharmacist at [facility] a verbal order for the Rebif.  I also spoke to patient and relayed that she needs to call [facility] [employer] to enroll in the program to get their services.  Called patient and relayed this information.

## 2014-07-10 ENCOUNTER — Encounter: Payer: Self-pay | Admitting: Family Medicine

## 2014-07-10 ENCOUNTER — Ambulatory Visit
Admission: RE | Admit: 2014-07-10 | Discharge: 2014-07-10 | Disposition: A | Discharge status: Home / Self Care | Payer: BC Managed Care – PPO | Source: Ambulatory Visit | Attending: Family Medicine | Admitting: Family Medicine

## 2014-07-10 ENCOUNTER — Other Ambulatory Visit: Payer: Self-pay | Admitting: *Deleted

## 2014-07-10 ENCOUNTER — Ambulatory Visit (INDEPENDENT_AMBULATORY_CARE_PROVIDER_SITE_OTHER): Payer: BC Managed Care – PPO | Admitting: Family Medicine

## 2014-07-10 VITALS — BP 130/74 | HR 84 | Temp 99.0°F | Resp 16 | Ht 65.0 in | Wt 132.0 lb

## 2014-07-10 DIAGNOSIS — R05 Cough: Secondary | ICD-10-CM

## 2014-07-10 DIAGNOSIS — J0100 Acute maxillary sinusitis, unspecified: Secondary | ICD-10-CM

## 2014-07-10 DIAGNOSIS — J209 Acute bronchitis, unspecified: Secondary | ICD-10-CM

## 2014-07-10 DIAGNOSIS — R059 Cough, unspecified: Secondary | ICD-10-CM

## 2014-07-10 DIAGNOSIS — R509 Fever, unspecified: Secondary | ICD-10-CM

## 2014-07-10 MED ORDER — AZITHROMYCIN 250 MG PO TABS: ORAL_TABLET | ORAL | Status: DC

## 2014-07-10 MED ORDER — PREDNISONE 10 MG PO TABS: ORAL_TABLET | ORAL | Status: DC

## 2014-07-10 NOTE — Progress Notes (Signed)
Patient ID: Melanie Richardson, female   DOB: 07/21/1951, 63 y.o.   MRN: [id number]   Subjective:    Patient ID: Melanie LothLanis D Dalpe, female    DOB: 05/31/1951, 63 y.o.   MRN: [id number]  Patient presents for Illness  Patient here with worsening cough with production and sinus pressure and drainage. She did having symptoms for a few days before she presented to urgent care she was given amoxicillin 875 mg twice a day after about 3 or 4 days if she does not improve and she called back and they gave her cefuroxime she still not improved therefore she came in today. Of note they also gave her pseudoephedrine like cough syrup as well as promethazine DM. She does take prednisone chronically secondary to trigeminal neuralgia which is very severe. She also has wheezing from time to time therefore has albuterol at home she is a smoker. She's noticed some tightness in her chest worse in the past couple of days. She's also had subjective fever she's here with her husband today   Review Of Systems:  GEN- +fatigue,+ fever, weight loss,weakness, recent illness HEENT- denies eye drainage, change in vision, nasal discharge, CVS- denies chest pain, palpitations RESP- denies SOB, +cough, +wheeze ABD- denies N/V, change in stools, abd pain GU- denies dysuria, hematuria, dribbling, incontinence Neuro- denies headache, dizziness, syncope, seizure activity       Objective:    BP 130/74  Pulse 84  Temp(Src) 99 F (37.2 C) (Oral)  Resp 16  Ht 5\' 5"  (1.651 m)  Wt 132 lb (59.875 kg)  BMI 21.97 kg/m2  LMP 10/06/1996 GEN- NAD, alert and oriented x3 HEENT- PERRL, EOMI, non injected sclera, pink conjunctiva, MMM, oropharynx clear, nares clear drainage, no maxillary sinus tenderness, inflammed turbinates Neck- Supple, no LAD CVS- RRR, no murmur RESP-course BS, scattered wheeze, rhonchi right base, normal WOB at rest EXT- No edema Pulses- Radial 2+        Assessment & Plan:      Problem List Items Addressed  This Visit   Sinusitis, acute   Relevant Medications      cefUROXime (CEFTIN) 500 MG tablet      promethazine-dextromethorphan (PROMETHAZINE-DM) 6.25-15 MG/5ML syrup    Other Visit Diagnoses   Acute bronchitis, unspecified organism    -  Primary    Chest x-ray did not show any pneumonia. I will treat her for bronchitis I will have her increase her prednisone and will do a first with 40 mg x 3 days, then 20mg  x 3, then 10mg  x 3, then she can return to regular dosing Zpak sent as she has done better with this than the other antibiotics    Cough        Relevant Orders       DG Chest 2 View (Completed)       Note: This dictation was prepared with Dragon dictation along with smaller phrase technology. Any transcriptional errors that result from this process are unintentional.

## 2014-07-10 NOTE — Patient Instructions (Signed)
Get chest xray, we will call with lab results  F/U as needed

## 2014-08-03 ENCOUNTER — Telehealth: Payer: Self-pay | Admitting: Neurology

## 2014-08-03 MED ORDER — AMITRIPTYLINE HCL 50 MG PO TABS: 50.0000 mg | ORAL_TABLET | Freq: Every day | ORAL | Status: DC

## 2014-08-03 NOTE — Telephone Encounter (Signed)
Patient calling to request refill of amitriptyline [facility] [address], she is completely out and does not have enough for tonight's dose, patient already has an office visit scheduled with Dr. Lucia Gaskins on 10/26/14.

## 2014-08-03 NOTE — Telephone Encounter (Signed)
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  Refill sent to last until appt.

## 2014-08-07 ENCOUNTER — Encounter: Payer: Self-pay | Admitting: Family Medicine

## 2014-08-10 ENCOUNTER — Ambulatory Visit (INDEPENDENT_AMBULATORY_CARE_PROVIDER_SITE_OTHER): Payer: BC Managed Care – PPO | Admitting: Family Medicine

## 2014-08-10 DIAGNOSIS — Z23 Encounter for immunization: Secondary | ICD-10-CM

## 2014-08-29 ENCOUNTER — Other Ambulatory Visit: Payer: Self-pay | Admitting: Family Medicine

## 2014-09-04 ENCOUNTER — Telehealth: Payer: Self-pay | Admitting: Neurology

## 2014-09-04 NOTE — Telephone Encounter (Signed)
Shanda Bumps - I don't prescribe Hydrocodone. I Will refill the Diazepam. Would you please let patient know?    I do think that since this is an MS patient and I have not seen her yet in the office, it would be best for patient to follow up with Dr. Epimenio Foot instead in January. I will speak to Marylu Lund and [place] about that. Thank you.

## 2014-09-04 NOTE — Telephone Encounter (Signed)
Patient called back and stated she switched Neurologist(Previous Neurologist prescribed Hydrocodone) to [facility] Neurology, saw Dr. Hosie Poisson which at that time it wasn't time for refill and was assigned to Dr. Lucia Gaskins after his departure.  Stated its time to have medication refilled and questioning what to do?  Please return call and advise.

## 2014-09-04 NOTE — Telephone Encounter (Signed)
I called back.  Explained Dr Lucia Gaskins does not prescribe Hydrocodone.  Patient said she has been taking this med since 1998.  I recommended she contact her PCP, if desired, regarding this Rx.  She seemed agreeable to this plan.    Diazepam Rx entered.

## 2014-09-04 NOTE — Telephone Encounter (Signed)
Patient requesting Rx refill for Hydrocodone-Acetaminophen 7.5-300 MG TABS, call when ready for pick up.  Also requesting Rx refill for diazepam (VALIUM) 5 MG tablet [facility] due to refill has expired.

## 2014-09-04 NOTE — Telephone Encounter (Signed)
It does not appear we have prescribed Hydrocodone.  I called the patient back to clarify.  Got no answer.  Left message.

## 2014-09-04 NOTE — Telephone Encounter (Signed)
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   Patient is requesting a refill on Hydrocodone and Diazepam.  Our records show we have never prescribed Hydrocodone.  Patient states previous Neurologist used to prescribe this pain med, and she has not needed a refill since switching to [facility].  Would you like to fill?  Please advise.  (Has appt in Jan)

## 2014-09-05 NOTE — Telephone Encounter (Signed)
Melanie Richardson - would you switch this patient to Dr. Epimenio Foot please? This is the one we discussed. Thank you.

## 2014-09-08 NOTE — Telephone Encounter (Signed)
Patient requesting Rx refill for predniSONE (DELTASONE) 10 MG tablet.  Please call and advise.

## 2014-09-09 NOTE — Telephone Encounter (Signed)
I called the patient back (there were system issues with Epic yesterday-was unable to access chart).  She would like to know if Dr Lucia GaskinsAhern will increase Prednisone 10mg  tabs from 1/4 tab three times daily to one full tab three times daily.  Says this dose will help with her trigeminal neuralgia pain since she will not be taking Hydrocodone.  Patient still has medication on hand, and says she she is okay until next week

## 2014-09-11 ENCOUNTER — Other Ambulatory Visit: Payer: Self-pay | Admitting: Neurology

## 2014-09-11 DIAGNOSIS — G500 Trigeminal neuralgia: Secondary | ICD-10-CM

## 2014-09-11 MED ORDER — PREDNISONE 10 MG PO TABS: ORAL_TABLET | ORAL | Status: DC

## 2014-09-11 NOTE — Telephone Encounter (Signed)
Melanie Richardson - I will increase her prednisone but I need her to follow up with Dr. Epimenio FootSater in January because 30mg  daily is a large dose. I would prefer her to start with a smaller dose so would caution her to try taking a 1/2 pill three times a day to see if that helps first but I will increase as she requests. Dr. Hosie PoissonSumner has discussed the risks of long-term steroids with her in the past so she understands the risks. Would you mind giving her a call please? Thank you!

## 2014-09-12 MED ORDER — DIAZEPAM 5 MG PO TABS: 5.0000 mg | ORAL_TABLET | Freq: Four times a day (QID) | ORAL | Status: DC | PRN

## 2014-09-12 NOTE — Telephone Encounter (Signed)
I called the patient back. Relayed Dr Celene Squibb' message.  Patient verbalized understanding and was agreeable to this plan.  She has an appt scheduled with [name] in Jan.

## 2014-09-27 ENCOUNTER — Other Ambulatory Visit: Payer: Self-pay | Admitting: Family Medicine

## 2014-10-10 ENCOUNTER — Other Ambulatory Visit: Payer: Self-pay | Admitting: Family Medicine

## 2014-10-10 NOTE — Telephone Encounter (Signed)
Refill appropriate and filled per protocol. 

## 2014-10-12 ENCOUNTER — Telehealth: Payer: Self-pay | Admitting: Neurology

## 2014-10-12 NOTE — Telephone Encounter (Signed)
Pt is calling stating she prior auth on REBIF 44 MCG/0.5ML injection for [facility].  Please call and advise.

## 2014-10-12 NOTE — Telephone Encounter (Signed)
I called back.  Patient provided me with ins info.  Phone [phone], ID # [id number].  All clinical info has been forwarded to ins.  Patient is aware.

## 2014-10-16 ENCOUNTER — Telehealth: Payer: Self-pay

## 2014-10-16 NOTE — Telephone Encounter (Signed)
I called the number patient provided.  Spoke with [employer].  She looked at the account and said nothing further is needed on our end.  They have Rx that is ready to be filled.  They will reach out to the patient to schedule delivery.

## 2014-10-16 NOTE — Telephone Encounter (Signed)
[employer] [place] has approved the request for coverage on Rebif effective until 10/05/2038 or until policy changes or is terminated Ref # [id number]

## 2014-10-16 NOTE — Telephone Encounter (Signed)
Patient is calling back to give [place] the number she uses for [facility]-[phone]. I told patient we have a number but she insisted on giving me this number.Thank you.

## 2014-10-26 ENCOUNTER — Ambulatory Visit (INDEPENDENT_AMBULATORY_CARE_PROVIDER_SITE_OTHER): Payer: BLUE CROSS/BLUE SHIELD | Admitting: Neurology

## 2014-10-26 ENCOUNTER — Ambulatory Visit: Payer: BC Managed Care – PPO | Admitting: Neurology

## 2014-10-26 ENCOUNTER — Encounter: Payer: Self-pay | Admitting: Neurology

## 2014-10-26 VITALS — BP 129/66 | HR 84 | Temp 98.1°F | Ht 65.5 in | Wt 132.0 lb

## 2014-10-26 DIAGNOSIS — R208 Other disturbances of skin sensation: Secondary | ICD-10-CM | POA: Insufficient documentation

## 2014-10-26 DIAGNOSIS — R5382 Chronic fatigue, unspecified: Secondary | ICD-10-CM

## 2014-10-26 DIAGNOSIS — R531 Weakness: Secondary | ICD-10-CM | POA: Insufficient documentation

## 2014-10-26 DIAGNOSIS — R269 Unspecified abnormalities of gait and mobility: Secondary | ICD-10-CM

## 2014-10-26 DIAGNOSIS — G35 Multiple sclerosis: Secondary | ICD-10-CM

## 2014-10-26 DIAGNOSIS — G500 Trigeminal neuralgia: Secondary | ICD-10-CM

## 2014-10-26 MED ORDER — HYDROCODONE-ACETAMINOPHEN 7.5-300 MG PO TABS: ORAL_TABLET | ORAL | Status: DC

## 2014-10-26 NOTE — Progress Notes (Signed)
[facility]  PATIENT: Melanie Richardson DOB: 02/13/1951  REFERRING CLINICIAN: Kingsley Spittle  HISTORY FROM: Patient MS   REASON FOR VISIT: MS and trigeminal Neuralgia   HISTORICAL  CHIEF COMPLAINT:  Chief Complaint  Patient presents with  . Multiple Sclerosis    HISTORY OF PRESENT ILLNESS:  Melanie Richardson is a 64 year old woman who had the onset of right sided facial numbness now pregnant during the 1970s. That numbness resolved over the next month or so. However, in the mid 1990s she began to experience pain in the same distribution. She had an MRI of the brain performed by Dr. Buzzy Han in 1998. It was consistent with multiple sclerosis.  She had an LP in the 1970's and the CSF was not significant;ly abnormal to diagnose MS.   She has been on Rebif since 2004.  Related to her MS, she has right trigeminal neuralgia. She also will get dysesthesias in the limbs at times but they are not constant.  Her trigeminal neuralgia is on the right. She will get short sharp episodes of pain. She has received the most benefit from combination of the antiepileptics and  amitriptyline with hydrocodone. Without hydrocodone her benefit is mild. Stress can sometimes trigger one of the events. In 1998, the spells were so frequent and severe that even moving her mouth to try to eat would trigger more severe pain. Pain has not been as bad more recently.   When she is having a day with many more spasms of pain, she gets benefit with hydrocodone and Valium.     Recently, she had radicular back and leg pain and she was placed on Lyrica. Although it did help her radicular pain, did not help the trigeminal neuralgia.  Besides  medications for the trigeminal neuralgia, she has also had gamma knife in 2005 and has had radiofrequency ablations twice. Unfortunately, none of those procedures gave her any lasting benefit.  She denies any major gait problems but is sometimes slightly off balanced.     She denies any bladder issues.    She denies any vision issues.    She has no weakness.     She has a lot of marital stress and her husband was forcibly removed from the home last January and he moved back in June.  However, she does feel their situation is any better.  Her mother also dies recently.    She helps care for her 58 yo father.     Fatigue is mild both days.     She has less insomnia than she had in the past.       REVIEW OF SYSTEMS:  Constitutional: No fevers, chills, sweats, or change in appetite Eyes: No visual changes, double vision, eye pain Ear, nose and throat: No hearing loss, ear pain, nasal congestion, sore throat Cardiovascular: No chest pain, palpitations Respiratory:  No shortness of breath at rest or with exertion.   No wheezes GastrointestinaI: No nausea, vomiting, diarrhea, abdominal pain, fecal incontinence Genitourinary:  No dysuria, urinary retention or frequency.  No nocturia. Musculoskeletal:  No neck pain, back pain Integumentary: No rash, pruritus, skin lesions Neurological: as above Psychiatric: No depression at this time.  No anxiety Endocrine: No palpitations, diaphoresis, change in appetite, change in weigh or increased thirst Hematologic/Lymphatic:  No anemia, purpura, petechiae. Allergic/Immunologic: No itchy/runny eyes, nasal congestion, recent allergic reactions, rashes  ALLERGIES: Allergies  Allergen Reactions  . Ciprofloxacin     Dizziness and headache  . Codeine  GI upset  . Erythromycin     HOME MEDICATIONS: Outpatient Prescriptions Prior to Visit  Medication Sig Dispense Refill  . albuterol (PROVENTIL HFA;VENTOLIN HFA) 108 (90 BASE) MCG/ACT inhaler Inhale 2 puffs into the lungs every 6 (six) hours as needed for wheezing. 1 Inhaler 0  . alendronate (FOSAMAX) 70 MG tablet Take 1 tablet (70 mg total) by mouth every 7 (seven) days. Take with a full glass of water on an empty stomach. 4 tablet 11  . amitriptyline (ELAVIL) 50 MG tablet  Take 1 tablet (50 mg total) by mouth at bedtime. 90 tablet 0  . aspirin 81 MG tablet Take 81 mg by mouth daily.    Marland Kitchen azithromycin (ZITHROMAX) 250 MG tablet Take (2) tablets by mouth on day 1, then take (1) tablet by mouth on days 2-5. 6 tablet 0  . BYSTOLIC 10 MG tablet TAKE 1 TABLET BY MOUTH DAILY 30 tablet 3  . BYSTOLIC 10 MG tablet TAKE 1 TABLET BY MOUTH DAILY 30 tablet 3  . carbamazepine (TEGRETOL XR) 400 MG 12 hr tablet Take 1 tablet (400 mg total) by mouth 3 (three) times daily. 90 tablet 6  . cefUROXime (CEFTIN) 500 MG tablet Take 500 mg by mouth 2 (two) times daily with a meal.    . cholecalciferol (VITAMIN D) 1000 UNITS tablet Take 1,000 Units by mouth daily.    . diazepam (VALIUM) 5 MG tablet Take 1 tablet (5 mg total) by mouth every 6 (six) hours as needed. 60 tablet 1  . eletriptan (RELPAX) 40 MG tablet Take 1 tablet (40 mg total) by mouth every 2 (two) hours as needed for migraine or headache. One tablet by mouth at onset of headache. May repeat in 2 hours if headache persists or recurs. 12 tablet 0  . fluticasone (FLONASE) 50 MCG/ACT nasal spray PLACE 2 SPRAYS INTO THE NOSE DAILY. 16 g 4  . Hydrocodone-Acetaminophen 7.5-300 MG TABS     . ibuprofen (ADVIL,MOTRIN) 200 MG tablet Take 200 mg by mouth every 6 (six) hours as needed.    . interferon beta-1a (REBIF) 44 MCG/0.5ML injection Inject 0.5 mLs (44 mcg total) into the skin 3 (three) times a week. 6 mL 6  . lamoTRIgine (LAMICTAL) 150 MG tablet Take 75 mg by mouth. 1/2 TAB DAILY    . levothyroxine (SYNTHROID, LEVOTHROID) 50 MCG tablet TAKE 1 TABLET BY MOUTH DAILY BEFORE BREAKFAST. 30 tablet 5  . losartan (COZAAR) 100 MG tablet TAKE 1 TABLET BY MOUTH EVERY DAY 30 tablet 5  . Multiple Vitamin (MULTIVITAMIN) tablet Take 1 tablet by mouth daily.    Marland Kitchen oxyCODONE-acetaminophen (PERCOCET) 10-325 MG per tablet Take 1 tablet by mouth every 8 (eight) hours as needed for pain. 90 tablet 0  . predniSONE (DELTASONE) 10 MG tablet Take 40mg  x 3 days,  then 20mg  x 3 days, then 10mg  x 3 days 21 tablet 0  . predniSONE (DELTASONE) 10 MG tablet 1/2 to one TAB THREE TIMES DAILY as needed for trigeminal neuralgia 90 tablet 6  . pregabalin (LYRICA) 75 MG capsule Take 75 mg by mouth 2 (two) times daily.    . promethazine-dextromethorphan (PROMETHAZINE-DM) 6.25-15 MG/5ML syrup Take 5 mLs by mouth 4 (four) times daily as needed for cough.    . REBIF 44 MCG/0.5ML injection INJECT SUBCUTANEOUSLY THREE TIMES PER WEEK. KEEP REFRIGERATED. AVOID FREEZING. 12 mL 3  . zolpidem (AMBIEN) 5 MG tablet TAKE 1 TABLET BY MOUTH AT BEDTIME AS NEEDED. 30 tablet 1   No facility-administered  medications prior to visit.    PAST MEDICAL HISTORY: Past Medical History  Diagnosis Date  . MS (multiple sclerosis)   . Trigeminal neuralgia   . Hypertension   . Osteopenia 05/2014    T score -1.8 FRAX 16%/4.7%  . Thyroid disease     Hypothyroid    PAST SURGICAL HISTORY: Past Surgical History  Procedure Laterality Date  . Underarm surgery  1988  . Feet surgery  1990  . Elbow surgery  1999    shattered elbow  . Radiofrquency rhizotomy  [id number]  . Gamma knife  2005  . Back surgery  2011    FAMILY HISTORY: Family History  Problem Relation Age of Onset  . Heart disease Mother   . Hypertension Father   . Cancer Father     colon  . Stroke Father   . Breast cancer Maternal Aunt 50  . Breast cancer Cousin 40    SOCIAL HISTORY:  History   Social History  . Marital Status: Married    Spouse Name: N/A    Number of Children: 2  . Years of Education: college   Occupational History  .  Other   Social History Main Topics  . Smoking status: Current Every Day Smoker -- 1.00 packs/day    Types: Cigarettes  . Smokeless tobacco: Never Used  . Alcohol Use: Yes     Comment: rarely  . Drug Use: No  . Sexual Activity: Yes    Birth Control/ Protection: Post-menopausal   Other Topics Concern  . Not on file   Social History Narrative   Patient lives at  home alone, has 2 children   Patient is right handed   Educational level is some college   Caffeine consumption is 3 cups a day     PHYSICAL EXAM  Filed Vitals:   10/26/14 1359  BP: 129/66  Pulse: 84  Temp: 98.1 F (36.7 C)  TempSrc: Oral  Height: 5' 5.5" (1.664 m)  Weight: 132 lb (59.875 kg)    Body mass index is 21.62 kg/(m^2).   General: The patient is well-developed and well-nourished and in no acute distress  Eyes:  Funduscopic exam shows normal optic discs and retinal vessels.  Neck: The neck is supple, no carotid bruits are noted.  The neck is nontender.  Respiratory: The respiratory examination is clear.  Cardiovascular: The cardiovascular examination reveals a regular rate and rhythm, no murmurs, gallops or rubs are noted.  Skin: Extremities are without significant edema.  Neurologic Exam  Mental status: The patient is alert and oriented x 3 at the time of the examination. The patient has apparent normal recent and remote memory, with an apparently normal attention span and concentration ability.   Speech is normal.  Cranial nerves: Extraocular movements are full. Pupils are equal, round, and reactive to light and accomodation.  Visual fields are full.  Facial symmetry is present. There is reduced right V2 and V3 facial sensation to soft touch.  Facial strength is normal.  Trapezius and sternocleidomastoid strength is normal. No dysarthria is noted.  The tongue is midline, and the patient has symmetric elevation of the soft palate. No obvious hearing deficits are noted.  Motor:  She has a mild high frequency tremor in head.   Muscle bulk and tone are normal. Strength is  5 / 5 in all 4 extremities.   Sensory: Sensory testing is intact to pinprick, soft touch, vibration sensation, and position sense on all 4 extremities.  Coordination: Cerebellar testing  reveals good finger-nose-finger and heel-to-shin bilaterally.  Gait and station: Station and gait are normal  but tandem is wide. Romberg is negative.   Reflexes: Deep tendon reflexes are symmetric and normal bilaterally. Plantar responses are normal.    DIAGNOSTIC DATA (LABS, IMAGING, TESTING) - I reviewed patient records, labs, notes, testing and imaging myself where available.  Lab Results  Component Value Date   WBC 7.3 04/24/2014   HGB 13.0 04/24/2014   HCT 37.5 04/24/2014   MCV 95 04/24/2014   PLT 204 04/24/2014      Component Value Date/Time   NA 137 04/24/2014 1314   NA 138 04/13/2013 1309   K 3.9 04/24/2014 1314   CL 97 04/24/2014 1314   CO2 24 04/24/2014 1314   GLUCOSE 124* 04/24/2014 1314   GLUCOSE 92 04/13/2013 1309   BUN 12 04/24/2014 1314   BUN 10 04/13/2013 1309   CREATININE 0.68 04/24/2014 1314   CREATININE 0.71 04/13/2013 1309   CALCIUM 8.8 04/24/2014 1314   PROT 6.5 04/24/2014 1314   PROT 6.6 04/13/2013 1309   ALBUMIN 3.9 04/13/2013 1309   AST 29 04/24/2014 1314   ALT 26 04/24/2014 1314   ALKPHOS 68 04/24/2014 1314   BILITOT <0.2 04/24/2014 1314   GFRNONAA 93 04/24/2014 1314   GFRNONAA >89 04/13/2013 1309   GFRAA 108 04/24/2014 1314   GFRAA >89 04/13/2013 1309    Lab Results  Component Value Date   TSH 3.289 10/17/2013       ASSESSMENT AND PLAN  MS (multiple sclerosis) - Plan: MR Brain W Wo Contrast  Trigeminal neuralgia - Plan: MR Brain W Wo Contrast  Dysesthesia - Plan: MR Brain W Wo Contrast  Abnormality of gait - Plan: MR Brain W Wo Contrast  Chronic fatigue  In summary, Melanie Richardson is a 64 year old woman with relapsing remitting multiple sclerosis who is currently stable on Rebif therapy. Her main problem is trigeminal neuralgia on the right. She continues to have some episodes of pain despite being on lamotrigine, carbamazepine, amitriptyline, hydrocodone. However, she is much better on this regimen than anything else. She will continue with the current doses of these medications. She is advised to stay active. I will obtain an MRI of  the brain to ensure that she is not having subclinical progression. If this is noted, we will need to consider another medicine besides the Rebif.  She will return to see me in 4 months or sooner if she has new or worsening neurologic symptoms.     Dewain Platz A. Epimenio Foot, MD, PhD 10/26/2014, 2:21 PM Certified in Neurology, Clinical Neurophysiology, Sleep Medicine, Pain Medicine and Neuroimaging  [facility] [address], [address] [place], [place] [address] [phone]

## 2014-10-28 ENCOUNTER — Other Ambulatory Visit: Payer: Self-pay | Admitting: Neurology

## 2014-10-28 ENCOUNTER — Other Ambulatory Visit: Payer: Self-pay | Admitting: Family Medicine

## 2014-11-11 ENCOUNTER — Other Ambulatory Visit: Payer: Self-pay | Admitting: Family Medicine

## 2014-11-16 ENCOUNTER — Ambulatory Visit (INDEPENDENT_AMBULATORY_CARE_PROVIDER_SITE_OTHER): Payer: BLUE CROSS/BLUE SHIELD

## 2014-11-16 DIAGNOSIS — G35 Multiple sclerosis: Secondary | ICD-10-CM

## 2014-11-16 DIAGNOSIS — R208 Other disturbances of skin sensation: Secondary | ICD-10-CM

## 2014-11-16 DIAGNOSIS — R269 Unspecified abnormalities of gait and mobility: Secondary | ICD-10-CM

## 2014-11-16 DIAGNOSIS — G500 Trigeminal neuralgia: Secondary | ICD-10-CM

## 2014-11-17 MED ORDER — GADOPENTETATE DIMEGLUMINE 469.01 MG/ML IV SOLN: 12.0000 mL | Freq: Once | INTRAVENOUS | Status: AC | PRN

## 2014-11-21 ENCOUNTER — Telehealth: Payer: Self-pay | Admitting: *Deleted

## 2014-11-21 NOTE — Telephone Encounter (Signed)
Spoke with Nansi and per RAS, adivsed mri brain shows no change, continue current tx. with no changes.  She verbalized understanding of same/fim

## 2014-11-22 ENCOUNTER — Telehealth: Payer: Self-pay | Admitting: Family Medicine

## 2014-11-22 NOTE — Telephone Encounter (Signed)
PA submitted through [email or web address]  

## 2014-11-27 NOTE — Telephone Encounter (Signed)
PA denied Bystolic medication - do you want to change?

## 2014-11-27 NOTE — Telephone Encounter (Signed)
Yes, stable on meds for a few years

## 2014-12-05 NOTE — Telephone Encounter (Signed)
Denied by insurance - faxed to [employer]

## 2014-12-18 ENCOUNTER — Other Ambulatory Visit: Payer: Self-pay | Admitting: Family Medicine

## 2014-12-18 MED ORDER — NEBIVOLOL HCL 10 MG PO TABS: 10.0000 mg | ORAL_TABLET | Freq: Every day | ORAL | Status: DC

## 2014-12-27 ENCOUNTER — Other Ambulatory Visit: Payer: Self-pay | Admitting: Neurology

## 2014-12-29 ENCOUNTER — Telehealth: Payer: Self-pay | Admitting: *Deleted

## 2014-12-29 ENCOUNTER — Other Ambulatory Visit: Payer: Self-pay | Admitting: Neurology

## 2014-12-29 NOTE — Telephone Encounter (Signed)
Patient calling wanting  Melanie Richardson to know that her pharmacy will be faxing over her new Rebif order. Please watch out for it per patient, thanks.

## 2015-01-01 ENCOUNTER — Telehealth: Payer: Self-pay | Admitting: Neurology

## 2015-01-01 NOTE — Telephone Encounter (Signed)
Melanie Richardson with [facility] is calling to get verbal to refill Rebif 44 MCG/0.5.  Please call.

## 2015-01-01 NOTE — Telephone Encounter (Signed)
Rx was previously sent on 03/25/  I called back.  Spoke with Synetta Fail who was not able to assist me and transferred me to [place].  He verified they have the order and it is in process.  They will call us back if anything further is needed.

## 2015-01-02 ENCOUNTER — Other Ambulatory Visit: Payer: Self-pay | Admitting: Neurology

## 2015-01-02 MED ORDER — ZOLPIDEM TARTRATE 5 MG PO TABS: 5.0000 mg | ORAL_TABLET | Freq: Every evening | ORAL | Status: DC | PRN

## 2015-01-02 NOTE — Telephone Encounter (Signed)
Pt is calling requesting a refill for zolpidem (AMBIEN) 5 MG tablet [facility] [address] [place].

## 2015-01-04 ENCOUNTER — Other Ambulatory Visit: Payer: Self-pay | Admitting: Neurology

## 2015-01-04 NOTE — Telephone Encounter (Signed)
Duplicate

## 2015-02-26 ENCOUNTER — Ambulatory Visit: Payer: BLUE CROSS/BLUE SHIELD | Admitting: Neurology

## 2015-03-01 ENCOUNTER — Ambulatory Visit (INDEPENDENT_AMBULATORY_CARE_PROVIDER_SITE_OTHER): Payer: BLUE CROSS/BLUE SHIELD | Admitting: Neurology

## 2015-03-01 ENCOUNTER — Encounter: Payer: Self-pay | Admitting: Neurology

## 2015-03-01 VITALS — BP 138/82 | HR 74 | Resp 14 | Ht 65.5 in | Wt 136.8 lb

## 2015-03-01 DIAGNOSIS — G500 Trigeminal neuralgia: Secondary | ICD-10-CM | POA: Diagnosis not present

## 2015-03-01 DIAGNOSIS — G35 Multiple sclerosis: Secondary | ICD-10-CM | POA: Diagnosis not present

## 2015-03-01 DIAGNOSIS — R269 Unspecified abnormalities of gait and mobility: Secondary | ICD-10-CM | POA: Diagnosis not present

## 2015-03-01 DIAGNOSIS — R208 Other disturbances of skin sensation: Secondary | ICD-10-CM | POA: Diagnosis not present

## 2015-03-01 DIAGNOSIS — R5382 Chronic fatigue, unspecified: Secondary | ICD-10-CM | POA: Diagnosis not present

## 2015-03-01 MED ORDER — DIAZEPAM 5 MG PO TABS: 5.0000 mg | ORAL_TABLET | Freq: Two times a day (BID) | ORAL | Status: DC | PRN

## 2015-03-01 MED ORDER — HYDROCODONE-ACETAMINOPHEN 7.5-300 MG PO TABS: ORAL_TABLET | ORAL | Status: DC

## 2015-03-01 MED ORDER — ZOLPIDEM TARTRATE 5 MG PO TABS: 5.0000 mg | ORAL_TABLET | Freq: Every evening | ORAL | Status: DC | PRN
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
## 2015-03-24 ENCOUNTER — Other Ambulatory Visit: Payer: Self-pay | Admitting: Family Medicine

## 2015-05-29 ENCOUNTER — Other Ambulatory Visit: Payer: Self-pay | Admitting: Gynecology

## 2015-06-13 ENCOUNTER — Other Ambulatory Visit: Payer: Self-pay | Admitting: Family Medicine

## 2015-07-03 ENCOUNTER — Ambulatory Visit: Payer: BLUE CROSS/BLUE SHIELD | Admitting: Neurology

## 2015-07-20 ENCOUNTER — Telehealth: Payer: Self-pay | Admitting: Family Medicine

## 2015-07-20 NOTE — Telephone Encounter (Signed)
PA has been submitted for Bystolic 10 mg thru [facility] case [id number]

## 2015-07-23 NOTE — Telephone Encounter (Signed)
I dont have any background history on her, will send to Dr. Tanya Nones, looks like she needs an OV as well

## 2015-07-23 NOTE — Telephone Encounter (Signed)
Letter sent.

## 2015-07-23 NOTE — Telephone Encounter (Signed)
Bystolic has been denied.  Please advise?

## 2015-07-23 NOTE — Telephone Encounter (Signed)
Ntbs, hasn't been seen in >12 months.

## 2015-07-25 ENCOUNTER — Encounter: Payer: Self-pay | Admitting: Neurology

## 2015-07-25 ENCOUNTER — Ambulatory Visit (INDEPENDENT_AMBULATORY_CARE_PROVIDER_SITE_OTHER): Payer: BLUE CROSS/BLUE SHIELD | Admitting: Neurology

## 2015-07-25 VITALS — BP 146/82 | HR 68 | Resp 14 | Ht 65.5 in | Wt 136.4 lb

## 2015-07-25 DIAGNOSIS — R269 Unspecified abnormalities of gait and mobility: Secondary | ICD-10-CM | POA: Diagnosis not present

## 2015-07-25 DIAGNOSIS — G35 Multiple sclerosis: Secondary | ICD-10-CM

## 2015-07-25 DIAGNOSIS — R208 Other disturbances of skin sensation: Secondary | ICD-10-CM | POA: Diagnosis not present

## 2015-07-25 DIAGNOSIS — F411 Generalized anxiety disorder: Secondary | ICD-10-CM | POA: Diagnosis not present

## 2015-07-25 DIAGNOSIS — G500 Trigeminal neuralgia: Secondary | ICD-10-CM | POA: Diagnosis not present

## 2015-07-25 DIAGNOSIS — R5382 Chronic fatigue, unspecified: Secondary | ICD-10-CM

## 2015-07-25 DIAGNOSIS — G35D Multiple sclerosis, unspecified: Secondary | ICD-10-CM

## 2015-07-25 MED ORDER — HYDROCODONE-ACETAMINOPHEN 7.5-325 MG PO TABS: 1.0000 | ORAL_TABLET | Freq: Four times a day (QID) | ORAL | Status: DC | PRN

## 2015-07-25 MED ORDER — ZOLPIDEM TARTRATE 5 MG PO TABS: 5.0000 mg | ORAL_TABLET | Freq: Every evening | ORAL | Status: DC | PRN

## 2015-07-25 MED ORDER — PREGABALIN 75 MG PO CAPS: 75.0000 mg | ORAL_CAPSULE | Freq: Two times a day (BID) | ORAL | Status: DC

## 2015-07-25 MED ORDER — DIAZEPAM 5 MG PO TABS: 5.0000 mg | ORAL_TABLET | Freq: Two times a day (BID) | ORAL | Status: DC | PRN

## 2015-07-25 NOTE — Progress Notes (Signed)
[facility]  PATIENT: Melanie Richardson DOB: Jul 26, 1951  REFERRING CLINICIAN: Kingsley Spittle [place] HISTORY FROM: Patient MS   REASON FOR VISIT: MS and trigeminal Neuralgia   HISTORICAL  CHIEF COMPLAINT:  Chief Complaint  Patient presents with  . Multiple Sclerosis    Sts. she continues to tolerate Rebif.  She denies new or worsening sx. Sts. trigeminal neuralgia is stable at this time.  She continues to be under more stress, with separation from her husband and pending divorce./fim  . Trigeminal Neuralgia    HISTORY OF PRESENT ILLNESS:  Melanie Richardson is a 64 year old woman with multiple sclerosis, trigeminal neuralgia and mood/stress issues.   Her MS is stable.  She denies any recent exacerbation.   She is on Rebif and tolerates it well.   Trigeminal neuralgia:    She has done well the past few months with no major flare-up. When they occur, she gets short sharp spasms of right facial pain. She has received the most benefit from combination of the antiepileptics (Tegretol, Pregabalin and sometimes Lamotrigine) and  Amitriptyline.   She is not needing as much hydrocodone for pain. Stress often triggers a spell.    Many years ago, her spells were so frequent and severe that even moving her mouth to try to eat would trigger more severe pain.    She has also had gamma knife in 2005 and has had radiofrequency ablations twice without much benefit  Gait/strength/sensation:   She notes no major gait problems but is slightly off balanced.  She occasionally stumbles but does not fall.   Her right leg seems weaker when she is hot.  She has no weakness.  She has no numbness in arms or legs.    Bladder:   She denies any bladder issues.      Vision/hearing:   She denies any vision issues.   She feels the right hearing is not as good as the left.      Fatigue/sleep:   Fatigue is mild both days.  She has some insomnia but better than in the past.   Zolpidem helps when she can't quickly  fall asleep.     Mood/cognition:   She feels a mild depression between separation and father's poor health and mother's death in 18.     She has a lot of marital stress.  Sheis legally separated and she moved into her father's house (he is in Nursing home)  LBP/leg pain:  She has radicular back and right leg pain.  This is helped a lot by Lyrica. She notes she limps some if temperatures are warmer.    MS History:   She had the onset of right sided facial numbness now pregnant during the 1970s. That numbness resolved over the next month or so. However, in the mid 1990s she began to experience pain in the same distribution. She had an MRI of the brain performed by Dr. Buzzy Han in 1998. It was consistent with multiple sclerosis.  She had an LP in the 1970's and the CSF was not significantly abnormal to diagnose MS.   She has been on Rebif since 2004.  She tolerates it well and has not had any recent exacerbation.     REVIEW OF SYSTEMS:  Constitutional: No fevers, chills, sweats, or change in appetite Eyes: No visual changes, double vision, eye pain Ear, nose and throat: No hearing loss, ear pain, nasal congestion, sore throat Cardiovascular: No chest pain, palpitations Respiratory:  No shortness of breath at rest  or with exertion.   No wheezes GastrointestinaI: No nausea, vomiting, diarrhea, abdominal pain, fecal incontinence Genitourinary:  No dysuria, urinary retention or frequency.  No nocturia. Musculoskeletal:  No neck pain, back pain Integumentary: No rash, pruritus, skin lesions Neurological: as above Psychiatric: No depression at this time.  No anxiety Endocrine: No palpitations, diaphoresis, change in appetite, change in weigh or increased thirst Hematologic/Lymphatic:  No anemia, purpura, petechiae. Allergic/Immunologic: No itchy/runny eyes, nasal congestion, recent allergic reactions, rashes  ALLERGIES: Allergies  Allergen Reactions  . Ciprofloxacin     Dizziness and headache    . Codeine     GI upset  . Erythromycin     HOME MEDICATIONS: Outpatient Prescriptions Prior to Visit  Medication Sig Dispense Refill  . albuterol (PROVENTIL HFA;VENTOLIN HFA) 108 (90 BASE) MCG/ACT inhaler Inhale 2 puffs into the lungs every 6 (six) hours as needed for wheezing. 1 Inhaler 0  . alendronate (FOSAMAX) 70 MG tablet   11  . amitriptyline (ELAVIL) 50 MG tablet TAKE 1 TABLET BY MOUTH AT BEDTIME. 90 tablet 3  . aspirin 81 MG tablet Take 81 mg by mouth daily.    . carbamazepine (TEGRETOL XR) 400 MG 12 hr tablet TAKE 1 TABLET (400 MG TOTAL) BY MOUTH 3 (THREE) TIMES DAILY. 90 tablet 11  . cholecalciferol (VITAMIN D) 1000 UNITS tablet Take 1,000 Units by mouth daily.    . diazepam (VALIUM) 5 MG tablet Take 1 tablet (5 mg total) by mouth every 12 (twelve) hours as needed. 60 tablet 2  . eletriptan (RELPAX) 40 MG tablet Take 1 tablet (40 mg total) by mouth every 2 (two) hours as needed for migraine or headache. One tablet by mouth at onset of headache. May repeat in 2 hours if headache persists or recurs. 12 tablet 0  . fluticasone (FLONASE) 50 MCG/ACT nasal spray PLACE 2 SPRAYS INTO THE NOSE DAILY. 16 g 4  . Hydrocodone-Acetaminophen 7.5-300 MG TABS One pill up to 4 times a day 120 each 0  . ibuprofen (ADVIL,MOTRIN) 200 MG tablet Take 200 mg by mouth every 6 (six) hours as needed.    . lamoTRIgine (LAMICTAL) 150 MG tablet TAKE 1 TABLET (150 MG TOTAL) BY MOUTH DAILY. 90 tablet 3  . levothyroxine (SYNTHROID, LEVOTHROID) 50 MCG tablet TAKE 1 TABLET BY MOUTH DAILY BEFORE BREAKFAST. 30 tablet 0  . losartan (COZAAR) 100 MG tablet TAKE 1 TABLET BY MOUTH EVERY DAY 30 tablet 4  . Multiple Vitamin (MULTIVITAMIN) tablet Take 1 tablet by mouth daily.    . nebivolol (BYSTOLIC) 10 MG tablet Take 1 tablet (10 mg total) by mouth daily. 90 tablet 3  . predniSONE (DELTASONE) 10 MG tablet Take 40mg  x 3 days, then 20mg  x 3 days, then 10mg  x 3 days 21 tablet 0  . pregabalin (LYRICA) 75 MG capsule Take 75 mg  by mouth 2 (two) times daily.    Marland Kitchen REBIF 44 MCG/0.5ML SOSY injection INJECT ONE SYRINGE ( ) SUBCUTANEOUSLY THREE TIMES PER WEEK 12 Syringe 11  . zolpidem (AMBIEN) 5 MG tablet Take 1 tablet (5 mg total) by mouth at bedtime as needed. 30 tablet 5  . alendronate (FOSAMAX) 70 MG tablet TAKE 1 TABLET BY MOUTH EVERY 7 DAYS W/ FULL GLASS OF WATER ON AN EMPTY STOMACH (Patient not taking: Reported on 07/25/2015) 4 tablet 1  . Levothyroxine Sodium 25 MCG CAPS Take 1 capsule by mouth daily before breakfast.    . promethazine-dextromethorphan (PROMETHAZINE-DM) 6.25-15 MG/5ML syrup Take 5 mLs by mouth 4 (four) times  daily as needed for cough.     No facility-administered medications prior to visit.    PAST MEDICAL HISTORY: Past Medical History  Diagnosis Date  . MS (multiple sclerosis) (HCC)   . Trigeminal neuralgia   . Hypertension   . Osteopenia 05/2014    T score -1.8 FRAX 16%/4.7%  . Thyroid disease     Hypothyroid    PAST SURGICAL HISTORY: Past Surgical History  Procedure Laterality Date  . Underarm surgery  1988  . Feet surgery  1990  . Elbow surgery  1999    shattered elbow  . Radiofrquency rhizotomy  [id number]  . Gamma knife  2005  . Back surgery  2011    FAMILY HISTORY: Family History  Problem Relation Age of Onset  . Heart disease Mother   . Hypertension Father   . Cancer Father     colon  . Stroke Father   . Breast cancer Maternal Aunt 50  . Breast cancer Cousin 4    SOCIAL HISTORY:  Social History   Social History  . Marital Status: Married    Spouse Name: N/A  . Number of Children: 2  . Years of Education: college   Occupational History  .  Other   Social History Main Topics  . Smoking status: Current Every Day Smoker -- 1.00 packs/day    Types: Cigarettes  . Smokeless tobacco: Never Used  . Alcohol Use: Yes     Comment: rarely  . Drug Use: No  . Sexual Activity: Yes    Birth Control/ Protection: Post-menopausal   Other Topics Concern  . Not  on file   Social History Narrative   Patient lives at home alone, has 2 children   Patient is right handed   Educational level is some college   Caffeine consumption is 3 cups a day     PHYSICAL EXAM  Filed Vitals:   07/25/15 1407  BP: 146/82  Pulse: 68  Resp: 14  Height: 5' 5.5" (1.664 m)  Weight: 136 lb 6.4 oz (61.871 kg)    Body mass index is 22.35 kg/(m^2).   General: The patient is well-developed and well-nourished and in no acute distress  Neck: The neck is supple.  The neck is nontender.  Skin: Extremities are without significant edema.  Back:   She is mildly tender over L5S1 paraspinal muscles.  No tenderness over piriformis muscle  Neurologic Exam  Mental status: The patient is alert and oriented x 3 at the time of the examination. The patient has apparent normal recent and remote memory, with an apparently normal attention span and concentration ability.   Speech is normal.  Cranial nerves: Extraocular movements are full.  Facial symmetry is present. There is mildly altered right V2 facial sensation to soft touch.  Facial strength is normal.  Trapezius and sternocleidomastoid strength is normal. No dysarthria is noted.  The tongue is midline, and the patient has symmetric elevation of the soft palate. Hearing is decreased on the right but Weber does not lateralize.    Motor:   Muscle bulk and tone are normal. Strength is  5 / 5 in all 4 extremities.   Sensory: Sensory testing is intact to temperature and touch but vibration decreased in right foot.  Coordination: Cerebellar testing reveals good finger-nose-finger and heel-to-shin bilaterally.  Gait and station: Station and gait are normal but tandem is wide. Romberg is negative.   Reflexes: Deep tendon reflexes are symmetric and normal bilaterally.     DIAGNOSTIC  DATA (LABS, IMAGING, TESTING) - I reviewed patient records, labs, notes, testing and imaging myself where available.  Lab Results  Component  Value Date   WBC 7.3 04/24/2014   HGB 13.0 04/24/2014   HCT 37.5 04/24/2014   MCV 95 04/24/2014   PLT 204 04/24/2014      Component Value Date/Time   NA 137 04/24/2014 1314   NA 138 04/13/2013 1309   K 3.9 04/24/2014 1314   CL 97 04/24/2014 1314   CO2 24 04/24/2014 1314   GLUCOSE 124* 04/24/2014 1314   GLUCOSE 92 04/13/2013 1309   BUN 12 04/24/2014 1314   BUN 10 04/13/2013 1309   CREATININE 0.68 04/24/2014 1314   CREATININE 0.71 04/13/2013 1309   CALCIUM 8.8 04/24/2014 1314   PROT 6.5 04/24/2014 1314   PROT 6.6 04/13/2013 1309   ALBUMIN 4.0 04/24/2014 1314   ALBUMIN 3.9 04/13/2013 1309   AST 29 04/24/2014 1314   ALT 26 04/24/2014 1314   ALKPHOS 68 04/24/2014 1314   BILITOT <0.2 04/24/2014 1314   GFRNONAA 93 04/24/2014 1314   GFRNONAA >89 04/13/2013 1309   GFRAA 108 04/24/2014 1314   GFRAA >89 04/13/2013 1309    Lab Results  Component Value Date   TSH 3.289 10/17/2013       ASSESSMENT AND PLAN  No diagnosis found.  1.  Continue medications for trigeminal neuralgia. Renew reps. 2.  She will continue with counseling for her stress related issues 3.  Continue to exercise stay active. 4.   Return to clinic 4 months or sooner if there are new or worsening neurologic symptoms.   Raiden Yearwood A. Epimenio Foot, MD, PhD 07/25/2015, 2:14 PM Certified in Neurology, Clinical Neurophysiology, Sleep Medicine, Pain Medicine and Neuroimaging  [facility] [address], [address] [place], [place] [address] [phone]

## 2015-07-30 ENCOUNTER — Other Ambulatory Visit: Payer: Self-pay | Admitting: Gynecology

## 2015-07-30 ENCOUNTER — Other Ambulatory Visit: Payer: Self-pay | Admitting: Family Medicine

## 2015-07-30 NOTE — Telephone Encounter (Signed)
Annual exam was due in August. Has appt scheduled with you for CE 10/08/2015.

## 2015-07-30 NOTE — Telephone Encounter (Signed)
Medication filled x1 with no refills.   Requires office visit before any further refills can be given.  

## 2015-07-30 NOTE — Telephone Encounter (Signed)
Annual scheduled on 10/08/15

## 2015-07-31 ENCOUNTER — Other Ambulatory Visit: Payer: BLUE CROSS/BLUE SHIELD

## 2015-07-31 DIAGNOSIS — Z79899 Other long term (current) drug therapy: Secondary | ICD-10-CM

## 2015-07-31 DIAGNOSIS — G35 Multiple sclerosis: Secondary | ICD-10-CM

## 2015-07-31 DIAGNOSIS — F411 Generalized anxiety disorder: Secondary | ICD-10-CM

## 2015-07-31 DIAGNOSIS — R5382 Chronic fatigue, unspecified: Secondary | ICD-10-CM

## 2015-07-31 DIAGNOSIS — E039 Hypothyroidism, unspecified: Secondary | ICD-10-CM

## 2015-07-31 DIAGNOSIS — M8580 Other specified disorders of bone density and structure, unspecified site: Secondary | ICD-10-CM

## 2015-08-01 LAB — LIPID PANEL
Cholesterol: 212 mg/dL — ABNORMAL HIGH (ref 125–200)
HDL: 75 mg/dL (ref >=46)
LDL CALC: 97 mg/dL (ref <130)
Total CHOL/HDL Ratio: 2.8 Ratio (ref <=5.0)
Triglycerides: 201 mg/dL — ABNORMAL HIGH (ref <150)
VLDL: 40 mg/dL — AB (ref <30)

## 2015-08-01 LAB — CBC WITH DIFFERENTIAL/PLATELET
Basophils Absolute: 0.1 10*3/uL (ref 0.0–0.1)
Basophils Relative: 1 % (ref 0–1)
EOS ABS: 0.1 10*3/uL (ref 0.0–0.7)
EOS PCT: 1 % (ref 0–5)
HCT: 34.6 % — ABNORMAL LOW (ref 36.0–46.0)
Hemoglobin: 12.4 g/dL (ref 12.0–15.0)
Lymphocytes Relative: 26 % (ref 12–46)
Lymphs Abs: 1.6 10*3/uL (ref 0.7–4.0)
MCH: 33.1 pg (ref 26.0–34.0)
MCHC: 35.8 g/dL (ref 30.0–36.0)
MCV: 92.3 fL (ref 78.0–100.0)
MPV: 11.8 fL (ref 8.6–12.4)
Monocytes Absolute: 0.5 10*3/uL (ref 0.1–1.0)
Monocytes Relative: 9 % (ref 3–12)
NEUTROS PCT: 63 % (ref 43–77)
Neutro Abs: 3.8 10*3/uL (ref 1.7–7.7)
PLATELETS: 155 10*3/uL (ref 150–400)
RBC: 3.75 MIL/uL — ABNORMAL LOW (ref 3.87–5.11)
RDW: 13.3 % (ref 11.5–15.5)
WBC: 6 10*3/uL (ref 4.0–10.5)

## 2015-08-01 LAB — COMPLETE METABOLIC PANEL WITH GFR
ALT: 16 U/L (ref 6–29)
AST: 21 U/L (ref 10–35)
Albumin: 3.8 g/dL (ref 3.6–5.1)
Alkaline Phosphatase: 47 U/L (ref 33–130)
BUN: 14 mg/dL (ref 7–25)
CALCIUM: 9.5 mg/dL (ref 8.6–10.4)
CO2: 25 mmol/L (ref 20–31)
CREATININE: 0.86 mg/dL (ref 0.50–0.99)
Chloride: 102 mmol/L (ref 98–110)
GFR, EST AFRICAN AMERICAN: 83 mL/min (ref >=60)
GFR, Est Non African American: 72 mL/min (ref >=60)
Glucose, Bld: 72 mg/dL (ref 70–99)
POTASSIUM: 3.8 mmol/L (ref 3.5–5.3)
Sodium: 139 mmol/L (ref 135–146)
Total Bilirubin: 0.4 mg/dL (ref 0.2–1.2)
Total Protein: 6.8 g/dL (ref 6.1–8.1)

## 2015-08-01 LAB — VITAMIN D 25 HYDROXY (VIT D DEFICIENCY, FRACTURES): Vit D, 25-Hydroxy: 38 ng/mL (ref 30–100)

## 2015-08-01 LAB — TSH: TSH: 3.772 u[IU]/mL (ref 0.350–4.500)

## 2015-08-06 ENCOUNTER — Ambulatory Visit (INDEPENDENT_AMBULATORY_CARE_PROVIDER_SITE_OTHER): Payer: BLUE CROSS/BLUE SHIELD | Admitting: Family Medicine

## 2015-08-06 ENCOUNTER — Encounter: Payer: Self-pay | Admitting: Family Medicine

## 2015-08-06 VITALS — BP 140/72 | HR 72 | Temp 98.7°F | Resp 14 | Ht 66.0 in | Wt 139.0 lb

## 2015-08-06 DIAGNOSIS — Z23 Encounter for immunization: Secondary | ICD-10-CM | POA: Diagnosis not present

## 2015-08-06 DIAGNOSIS — F411 Generalized anxiety disorder: Secondary | ICD-10-CM | POA: Diagnosis not present

## 2015-08-06 DIAGNOSIS — I10 Essential (primary) hypertension: Secondary | ICD-10-CM

## 2015-08-06 DIAGNOSIS — E781 Pure hyperglyceridemia: Secondary | ICD-10-CM | POA: Diagnosis not present

## 2015-08-06 DIAGNOSIS — G35 Multiple sclerosis: Secondary | ICD-10-CM | POA: Diagnosis not present

## 2015-08-06 DIAGNOSIS — J309 Allergic rhinitis, unspecified: Secondary | ICD-10-CM

## 2015-08-06 DIAGNOSIS — E038 Other specified hypothyroidism: Secondary | ICD-10-CM | POA: Diagnosis not present

## 2015-08-06 MED ORDER — LEVOTHYROXINE SODIUM 50 MCG PO TABS: ORAL_TABLET | ORAL | Status: DC

## 2015-08-06 MED ORDER — NEBIVOLOL HCL 10 MG PO TABS: 10.0000 mg | ORAL_TABLET | Freq: Every day | ORAL | Status: DC

## 2015-08-06 MED ORDER — ALBUTEROL SULFATE HFA 108 (90 BASE) MCG/ACT IN AERS: 2.0000 | INHALATION_SPRAY | Freq: Four times a day (QID) | RESPIRATORY_TRACT | Status: DC | PRN

## 2015-08-06 MED ORDER — FLUTICASONE PROPIONATE 50 MCG/ACT NA SUSP: NASAL | Status: DC

## 2015-08-06 MED ORDER — LOSARTAN POTASSIUM 100 MG PO TABS: 100.0000 mg | ORAL_TABLET | Freq: Every day | ORAL | Status: DC

## 2015-08-06 NOTE — Patient Instructions (Signed)
Continue current medications Work on the cholesterol F/U 6 months

## 2015-08-06 NOTE — Progress Notes (Signed)
Patient ID: Melanie Richardson, female   DOB: May 28, 1951, 64 y.o.   MRN: [id number]   Subjective:    Patient ID: Melanie Richardson, female    DOB: 24-Sep-1951, 64 y.o.   MRN: [id number]  Patient presents for F/u  patient here for follow-up on medications. She is still being followed by neurology for her multiple sclerosis as well as trigeminal neuralgia.   Hypertension she is taking her by systolic and her losartan without any difficulties. Her fasting labs were reviewed. Her triglycerides were elevated some she states that she's need a lot of junk food at night. She is almost finalizing her divorce which is been almost a year her father is also in a nursing home. When she needs to she takes Valium to help ease her anxiety.   She's had some sinus drainage and pressure with the change in weather she has not been using her Flonase which typically helps. She did take Sudafed today. She denies any cough no congestion no fever,  She has not required the use of her albuterol    Review Of Systems:  GEN- denies fatigue, fever, weight loss,weakness, recent illness HEENT- denies eye drainage, change in vision, nasal discharge, CVS- denies chest pain, palpitations RESP- denies SOB, cough, wheeze ABD- denies N/V, change in stools, abd pain GU- denies dysuria, hematuria, dribbling, incontinence MSK-+joint pain, muscle aches, injury Neuro- denies headache, dizziness, syncope, seizure activity       Objective:    BP 140/72 mmHg  Pulse 72  Temp(Src) 98.7 F (37.1 C) (Oral)  Resp 14  Ht 5\' 6"  (1.676 m)  Wt 139 lb (63.05 kg)  BMI 22.45 kg/m2  LMP 10/06/1996 GEN- NAD, alert and oriented x3,walks with cane HEENT- PERRL, EOMI, non injected sclera, pink conjunctiva, MMM, oropharynx clear, clear rhinorrhea, no maxillary sinus tenderness  Neck- Supple, no thyromegaly CVS- RRR, no murmur RESP-CTAB EXT- No edema Pulses- Radial 2+        Assessment & Plan:      Problem List Items Addressed This  Visit    MS (multiple sclerosis) (HCC) - Primary   Hypothyroidism   Relevant Medications   levothyroxine (SYNTHROID, LEVOTHROID) 50 MCG tablet   nebivolol (BYSTOLIC) 10 MG tablet   Hypertriglyceridemia   Relevant Medications   losartan (COZAAR) 100 MG tablet   nebivolol (BYSTOLIC) 10 MG tablet   Hypertension   Relevant Medications   losartan (COZAAR) 100 MG tablet   nebivolol (BYSTOLIC) 10 MG tablet   Anxiety state    Other Visit Diagnoses    Need for prophylactic vaccination and inoculation against influenza        Relevant Orders    Flu Vaccine QUAD 36+ mos PF IM (Fluarix & Fluzone Quad PF) (Completed)       Note: This dictation was prepared with Dragon dictation along with smaller phrase technology. Any transcriptional errors that result from this process are unintentional.

## 2015-08-07 DIAGNOSIS — J309 Allergic rhinitis, unspecified: Secondary | ICD-10-CM | POA: Insufficient documentation

## 2015-08-07 NOTE — Assessment & Plan Note (Signed)
Thyroid at goal no changed the dose

## 2015-08-07 NOTE — Assessment & Plan Note (Signed)
Followed by neurology review last note she has multiple medications for pain control spasm as well as her trigeminal neuralgia

## 2015-08-07 NOTE — Assessment & Plan Note (Signed)
She is on Elavil as well as Valium as needed

## 2015-08-07 NOTE — Assessment & Plan Note (Signed)
Discussed dietary changes that are needed will not start any medications

## 2015-08-07 NOTE — Assessment & Plan Note (Signed)
Rhinitis due to the change in weather and allergies. She can continue the Flonase. I advised her not to take Sudafed more than a couple days in a row especially with her blood pressure she is better off using the nasal rinses or Mucinex

## 2015-08-07 NOTE — Assessment & Plan Note (Signed)
Blood pressure controlled. 

## 2015-08-13 ENCOUNTER — Telehealth: Payer: Self-pay | Admitting: *Deleted

## 2015-08-13 NOTE — Telephone Encounter (Signed)
Received request from pharmacy for PA on Bystolic.   PA submitted.   Dx: I10~ HTN

## 2015-08-16 ENCOUNTER — Other Ambulatory Visit: Payer: Self-pay | Admitting: Family Medicine

## 2015-08-22 NOTE — Telephone Encounter (Signed)
Received PA determination.   PA denied.   Patient must try and fail at least (2) preferred medicaitons: Labetolol, Atenolol, Carvedilol, Metoprolol.  MD please advise.

## 2015-08-22 NOTE — Telephone Encounter (Signed)
Change to Toprol  XL, advise insurance does not cover bystolic

## 2015-08-22 NOTE — Telephone Encounter (Signed)
FYI: Patient has been on Bystolic since 02/19/2013.

## 2015-08-23 MED ORDER — METOPROLOL SUCCINATE ER 25 MG PO TB24: 25.0000 mg | ORAL_TABLET | Freq: Every day | ORAL | Status: DC

## 2015-08-23 NOTE — Telephone Encounter (Signed)
Prescription sent to pharmacy.   Call placed to patient. LMTRC.  

## 2015-08-24 NOTE — Telephone Encounter (Signed)
Call placed to patient. LMTRC.  

## 2015-08-28 ENCOUNTER — Encounter: Payer: Self-pay | Admitting: *Deleted

## 2015-08-28 NOTE — Telephone Encounter (Signed)
Multiple calls placed to patient with no answer and no return call.   Letter sent.

## 2015-09-01 ENCOUNTER — Other Ambulatory Visit: Payer: Self-pay | Admitting: Family Medicine

## 2015-09-03 NOTE — Telephone Encounter (Signed)
Refill appropriate and filled per protocol. 

## 2015-10-08 ENCOUNTER — Encounter: Payer: Self-pay | Admitting: Gynecology

## 2015-10-08 ENCOUNTER — Ambulatory Visit (INDEPENDENT_AMBULATORY_CARE_PROVIDER_SITE_OTHER): Payer: BLUE CROSS/BLUE SHIELD | Admitting: Gynecology

## 2015-10-08 VITALS — BP 124/80 | Ht 65.0 in | Wt 133.0 lb

## 2015-10-08 DIAGNOSIS — N952 Postmenopausal atrophic vaginitis: Secondary | ICD-10-CM | POA: Diagnosis not present

## 2015-10-08 DIAGNOSIS — M8580 Other specified disorders of bone density and structure, unspecified site: Secondary | ICD-10-CM | POA: Diagnosis not present

## 2015-10-08 DIAGNOSIS — Z01419 Encounter for gynecological examination (general) (routine) without abnormal findings: Secondary | ICD-10-CM | POA: Diagnosis not present

## 2015-10-08 MED ORDER — ALENDRONATE SODIUM 70 MG PO TABS: ORAL_TABLET | ORAL | Status: DC

## 2015-10-08 NOTE — Progress Notes (Signed)
Melanie Richardson 01-27-1951 [id number]        65 y.o.  G2P2  for annual exam. Several issues noted below.  Past medical history,surgical history, problem list, medications, allergies, family history and social history were all reviewed and documented as reviewed in the EPIC chart.  ROS:  Performed with pertinent positives and negatives included in the history, assessment and plan.   Additional significant findings :  none   Exam: [name] Vitals:   10/08/15 1411  BP: 124/80  Height: 5\' 5"  (1.651 m)  Weight: 133 lb (60.328 kg)   General appearance:  Normal affect, orientation and appearance. Skin: Grossly normal HEENT: Without gross lesions.  No cervical or supraclavicular adenopathy. Thyroid normal.  Lungs:  Clear without wheezing, rales or rhonchi Cardiac: RR, without RMG Abdominal:  Soft, nontender, without masses, guarding, rebound, organomegaly or hernia Breasts:  Examined lying and sitting without masses, retractions, discharge or axillary adenopathy. Pelvic:  Ext/BUS/vagina with atrophic changes  Cervix with atrophic changes  Uterus anteverted, normal size, shape and contour, midline and mobile nontender   Adnexa  Without masses or tenderness    Anus and perineum  Normal   Rectovaginal  Normal sphincter tone without palpated masses or tenderness.    Assessment/Plan:  65 y.o. G2P2 female for annual exam.   1. Postmenopausal/atrophic genital changes. Patient doing well without significant hot flushes, night sweats, vaginal dryness or any vaginal bleeding. Continue to monitor report any issues or vaginal bleeding. 2. Osteopenia.  DEXA 05/2014 T score -1.8 FRAX 16%/4.7%.  We discussed this last year and she started on alendronate 70 mg weekly due to her increased hip fracture risk exceeding 3%. Will plan to repeat DEXA next year or 2 year interval. She will stay on alendronate for now she is doing well and I refilled 1 year. 3. Pap smear/HPV 2014 negative. No Pap  smear done today. No history of significant abnormal Pap smears.  Plan repeat Pap smear in 3-5 year interval. 4. Mammography 2013. Patient knows she is way overdue and agrees to call to schedule. SBE monthly reviewed. 5. Colonoscopy never. Again the patient acknowledges that she is way overdue and agrees to schedule this coming year. Names and numbers provided. 6. Health maintenance. Stop smoking reviewed. No routine lab work done as this is done at her primary physician's office. Follow up 1 year, sooner as needed.   Melanie Richardson, 2:32 PM 10/08/2015

## 2015-10-08 NOTE — Patient Instructions (Signed)
Call to Schedule your mammogram  Facilities in [place]: 1)  [facility]. [address] [place] Phone: [phone] 2)  Dr. Isaiah Blakes at [facility] [address] Phone: [phone]     Mammogram A mammogram is an X-ray test to find changes in a woman's breast. You should get a mammogram if:  You are 65 years of age or older  You have risk factors.   Your doctor recommends that you have one.  BEFORE THE TEST  Do not schedule the test the week before your period, especially if your breasts are sore during this time.  On the day of your mammogram:  Wash your breasts and armpits well. After washing, do not put on any deodorant or talcum powder on until after your test.   Eat and drink as you usually do.   Take your medicines as usual.   If you are diabetic and take insulin, make sure you:   Eat before coming for your test.   Take your insulin as usual.   If you cannot keep your appointment, call before the appointment to cancel. Schedule another appointment.  TEST  You will need to undress from the waist up. You will put on a hospital gown.   Your breast will be put on the mammogram machine, and it will press firmly on your breast with a piece of plastic called a compression paddle. This will make your breast flatter so that the machine can X-ray all parts of your breast.   Both breasts will be X-rayed. Each breast will be X-rayed from above and from the side. An X-ray might need to be taken again if the picture is not good enough.   The mammogram will last about 15 to 30 minutes.  AFTER THE TEST Finding out the results of your test Ask when your test results will be ready. Make sure you get your test results.  Document Released: 12/19/2008 Document Revised: 09/11/2011 Document Reviewed: 12/19/2008 [facility] Patient Information 2012 [place].  Schedule your colonoscopy with either:  [facility]   Address: [address], [place], [place] [address]  [phone]    or  [facility]  Address: [address], [place], [place] [address]  [phone]   You may obtain a copy of any labs that were done today by logging onto MyChart as outlined in the instructions provided with your AVS (after visit summary). The office will not call with normal lab results but certainly if there are any significant abnormalities then we will contact you.   Health Maintenance Adopting a healthy lifestyle and getting preventive care can go a long way to promote health and wellness. Talk with your health care provider about what schedule of regular examinations is right for you. This is a good chance for you to check in with your provider about disease prevention and staying healthy. In between checkups, there are plenty of things you can do on your own. Experts have done a lot of research about which lifestyle changes and preventive measures are most likely to keep you healthy. Ask your health care provider for more information. WEIGHT AND DIET  Eat a healthy diet  Be sure to include plenty of vegetables, fruits, low-fat dairy products, and lean protein.  Do not eat a lot of foods high in solid fats, added sugars, or salt.  Get regular exercise. This is one of the most important things  you can do for your health.  Most adults should exercise for at least 150 minutes each week. The exercise should increase your heart rate and make you sweat (moderate-intensity exercise).  Most adults should also do strengthening exercises at least twice a week. This is in addition to the moderate-intensity exercise.  Maintain a healthy weight  Body mass index (BMI) is a measurement that can be used to identify possible weight problems. It estimates body fat based on height and weight. Your health care provider can help determine your BMI and help you achieve or maintain a healthy  weight.  For females 72 years of age and older:   A BMI below 18.5 is considered underweight.  A BMI of 18.5 to 24.9 is normal.  A BMI of 25 to 29.9 is considered overweight.  A BMI of 30 and above is considered obese.  Watch levels of cholesterol and blood lipids  You should start having your blood tested for lipids and cholesterol at 65 years of age, then have this test every 5 years.  You may need to have your cholesterol levels checked more often if:  Your lipid or cholesterol levels are high.  You are older than 65 years of age.  You are at high risk for heart disease.  CANCER SCREENING   Lung Cancer  Lung cancer screening is recommended for adults 59-68 years old who are at high risk for lung cancer because of a history of smoking.  A yearly low-dose CT scan of the lungs is recommended for people who:  Currently smoke.  Have quit within the past 15 years.  Have at least a 30-pack-year history of smoking. A pack year is smoking an average of one pack of cigarettes a day for 1 year.  Yearly screening should continue until it has been 15 years since you quit.  Yearly screening should stop if you develop a health problem that would prevent you from having lung cancer treatment.  Breast Cancer  Practice breast self-awareness. This means understanding how your breasts normally appear and feel.  It also means doing regular breast self-exams. Let your health care provider know about any changes, no matter how small.  If you are in your 20s or 30s, you should have a clinical breast exam (CBE) by a health care provider every 1-3 years as part of a regular health exam.  If you are 53 or older, have a CBE every year. Also consider having a breast X-ray (mammogram) every year.  If you have a family history of breast cancer, talk to your health care provider about genetic screening.  If you are at high risk for breast cancer, talk to your health care provider about  having an MRI and a mammogram every year.  Breast cancer gene (BRCA) assessment is recommended for women who have family members with BRCA-related cancers. BRCA-related cancers include:  Breast.  Ovarian.  Tubal.  Peritoneal cancers.  Results of the assessment will determine the need for genetic counseling and BRCA1 and BRCA2 testing. Cervical Cancer Routine pelvic examinations to screen for cervical cancer are no longer recommended for nonpregnant women who are considered low risk for cancer of the pelvic organs (ovaries, uterus, and vagina) and who do not have symptoms. A pelvic examination may be necessary if you have symptoms including those associated with pelvic infections. Ask your health care provider if a screening pelvic exam is right for you.   The Pap test is the screening test for cervical cancer for  women who are considered at risk.  If you had a hysterectomy for a problem that was not cancer or a condition that could lead to cancer, then you no longer need Pap tests.  If you are older than 65 years, and you have had normal Pap tests for the past 10 years, you no longer need to have Pap tests.  If you have had past treatment for cervical cancer or a condition that could lead to cancer, you need Pap tests and screening for cancer for at least 20 years after your treatment.  If you no longer get a Pap test, assess your risk factors if they change (such as having a new sexual partner). This can affect whether you should start being screened again.  Some women have medical problems that increase their chance of getting cervical cancer. If this is the case for you, your health care provider may recommend more frequent screening and Pap tests.  The human papillomavirus (HPV) test is another test that may be used for cervical cancer screening. The HPV test looks for the virus that can cause cell changes in the cervix. The cells collected during the Pap test can be tested for  HPV.  The HPV test can be used to screen women 35 years of age and older. Getting tested for HPV can extend the interval between normal Pap tests from three to five years.  An HPV test also should be used to screen women of any age who have unclear Pap test results.  After 65 years of age, women should have HPV testing as often as Pap tests.  Colorectal Cancer  This type of cancer can be detected and often prevented.  Routine colorectal cancer screening usually begins at 65 years of age and continues through 65 years of age.  Your health care provider may recommend screening at an earlier age if you have risk factors for colon cancer.  Your health care provider may also recommend using home test kits to check for hidden blood in the stool.  A small camera at the end of a tube can be used to examine your colon directly (sigmoidoscopy or colonoscopy). This is done to check for the earliest forms of colorectal cancer.  Routine screening usually begins at age 30.  Direct examination of the colon should be repeated every 5-10 years through 65 years of age. However, you may need to be screened more often if early forms of precancerous polyps or small growths are found. Skin Cancer  Check your skin from head to toe regularly.  Tell your health care provider about any new moles or changes in moles, especially if there is a change in a mole's shape or color.  Also tell your health care provider if you have a mole that is larger than the size of a pencil eraser.  Always use sunscreen. Apply sunscreen liberally and repeatedly throughout the day.  Protect yourself by wearing long sleeves, pants, a wide-brimmed hat, and sunglasses whenever you are outside. HEART DISEASE, DIABETES, AND HIGH BLOOD PRESSURE   Have your blood pressure checked at least every 1-2 years. High blood pressure causes heart disease and increases the risk of stroke.  If you are between 62 years and 44 years old, ask  your health care provider if you should take aspirin to prevent strokes.  Have regular diabetes screenings. This involves taking a blood sample to check your fasting blood sugar level.  If you are at a normal weight and have a  low risk for diabetes, have this test once every three years after 65 years of age.  If you are overweight and have a high risk for diabetes, consider being tested at a younger age or more often. PREVENTING INFECTION  Hepatitis B  If you have a higher risk for hepatitis B, you should be screened for this virus. You are considered at high risk for hepatitis B if:  You were born in a country where hepatitis B is common. Ask your health care provider which countries are considered high risk.  Your parents were born in a high-risk country, and you have not been immunized against hepatitis B (hepatitis B vaccine).  You have HIV or AIDS.  You use needles to inject street drugs.  You live with someone who has hepatitis B.  You have had sex with someone who has hepatitis B.  You get hemodialysis treatment.  You take certain medicines for conditions, including cancer, organ transplantation, and autoimmune conditions. Hepatitis C  Blood testing is recommended for:  Everyone born from [birth year] through 1965.  Anyone with known risk factors for hepatitis C. Sexually transmitted infections (STIs)  You should be screened for sexually transmitted infections (STIs) including gonorrhea and chlamydia if:  You are sexually active and are younger than 65 years of age.  You are older than 65 years of age and your health care provider tells you that you are at risk for this type of infection.  Your sexual activity has changed since you were last screened and you are at an increased risk for chlamydia or gonorrhea. Ask your health care provider if you are at risk.  If you do not have HIV, but are at risk, it may be recommended that you take a prescription medicine daily to  prevent HIV infection. This is called pre-exposure prophylaxis (PrEP). You are considered at risk if:  You are sexually active and do not regularly use condoms or know the HIV status of your partner(s).  You take drugs by injection.  You are sexually active with a partner who has HIV. Talk with your health care provider about whether you are at high risk of being infected with HIV. If you choose to begin PrEP, you should first be tested for HIV. You should then be tested every 3 months for as long as you are taking PrEP.  PREGNANCY   If you are premenopausal and you may become pregnant, ask your health care provider about preconception counseling.  If you may become pregnant, take 400 to 800 micrograms (mcg) of folic acid every day.  If you want to prevent pregnancy, talk to your health care provider about birth control (contraception). OSTEOPOROSIS AND MENOPAUSE   Osteoporosis is a disease in which the bones lose minerals and strength with aging. This can result in serious bone fractures. Your risk for osteoporosis can be identified using a bone density scan.  If you are 25 years of age or older, or if you are at risk for osteoporosis and fractures, ask your health care provider if you should be screened.  Ask your health care provider whether you should take a calcium or vitamin D supplement to lower your risk for osteoporosis.  Menopause may have certain physical symptoms and risks.  Hormone replacement therapy may reduce some of these symptoms and risks. Talk to your health care provider about whether hormone replacement therapy is right for you.  HOME CARE INSTRUCTIONS   Schedule regular health, dental, and eye exams.  Stay current  with your immunizations.   Do not use any tobacco products including cigarettes, chewing tobacco, or electronic cigarettes.  If you are pregnant, do not drink alcohol.  If you are breastfeeding, limit how much and how often you drink  alcohol.  Limit alcohol intake to no more than 1 drink per day for nonpregnant women. One drink equals 12 ounces of beer, 5 ounces of wine, or 1 ounces of hard liquor.  Do not use street drugs.  Do not share needles.  Ask your health care provider for help if you need support or information about quitting drugs.  Tell your health care provider if you often feel depressed.  Tell your health care provider if you have ever been abused or do not feel safe at home. Document Released: 04/07/2011 Document Revised: 02/06/2014 Document Reviewed: 08/24/2013 [facility] Patient Information 2015 [place], [place]. This information is not intended to replace advice given to you by your health care provider. Make sure you discuss any questions you have with your health care provider.

## 2015-10-16 ENCOUNTER — Telehealth: Payer: Self-pay | Admitting: Neurology

## 2015-10-16 NOTE — Telephone Encounter (Signed)
Pt called sts REBIF 44 MCG/0.5ML SOSY injection needs PA. It goes to [employer] # [phone]. Pt said policy number has changed to [id number]  BIN# [id number]  Grp # [id number]  Anoushka D Freedman (subscriber) effective 10/07/15.

## 2015-10-16 NOTE — Telephone Encounter (Signed)
Ins has been contacted and provided with clinical info.  Request is under review Ref # [id number]  [employer] [place] has approved the request for coverage on Rebif effective until 10/04/2038, or until the policy changes or is terminated.  Ins indicates they have notified the patient of this decision as well.

## 2015-10-19 ENCOUNTER — Encounter: Payer: Self-pay | Admitting: *Deleted

## 2015-11-02 ENCOUNTER — Other Ambulatory Visit: Payer: Self-pay | Admitting: Neurology

## 2015-11-02 ENCOUNTER — Telehealth: Payer: Self-pay | Admitting: Neurology

## 2015-11-02 NOTE — Telephone Encounter (Signed)
It appears refill was resent.  Pharmacy confirmed receipt at 11:38am

## 2015-11-02 NOTE — Telephone Encounter (Signed)
Pt called and states her pharmacy did not receive rx for amitriptyline (ELAVIL) 50 MG tablet. She is asking that it be [facility] to [facility] [address]. Pt is completely out of medication.thank you

## 2015-11-30 ENCOUNTER — Other Ambulatory Visit: Payer: Self-pay | Admitting: Neurology

## 2015-12-04 ENCOUNTER — Telehealth: Payer: Self-pay | Admitting: Neurology

## 2015-12-04 MED ORDER — INTERFERON BETA-1A 44 MCG/0.5ML ~~LOC~~ SOSY: 44.0000 ug | PREFILLED_SYRINGE | SUBCUTANEOUS | Status: DC

## 2015-12-04 NOTE — Telephone Encounter (Signed)
I have spoken with Melanie Richardson and advised that rx (new start form) sent to [facility] and rx. also sent to [facility].  Unfortunately, we are not able to do her inj. 3 times weekly.  Will ask Raynelle Fanning with Rebif to f/u with pt. to see if additional training is needed.  She verbalized understanding of same./fim

## 2015-12-04 NOTE — Telephone Encounter (Signed)
Patient advised the phone number for [facility] [phone] and [facility] [phone], patient also wants to know if the nurse can give Rebif injections? (does Rebif injections 3x per week).

## 2015-12-04 NOTE — Telephone Encounter (Signed)
Patient called to request refill of REBIF 44 MCG/0.5ML SOSY injection [facility].

## 2015-12-13 ENCOUNTER — Other Ambulatory Visit: Payer: Self-pay | Admitting: Neurology

## 2015-12-26 ENCOUNTER — Other Ambulatory Visit: Payer: Self-pay | Admitting: Neurology

## 2015-12-31 ENCOUNTER — Telehealth: Payer: Self-pay | Admitting: Neurology

## 2015-12-31 NOTE — Telephone Encounter (Signed)
Patient called to advise, [facility] [address] will be sending request for refill of Prednisone 10mg .

## 2015-12-31 NOTE — Telephone Encounter (Signed)
I attempted to contact pt.--phone rang without being answered/fim

## 2016-01-01 MED ORDER — PREDNISONE 10 MG PO TABS: ORAL_TABLET | ORAL | Status: DC

## 2016-01-01 NOTE — Telephone Encounter (Signed)
Per RAS, ok to r/f Prednisone.  [facility] fax[facility] request/fim

## 2016-01-08 ENCOUNTER — Other Ambulatory Visit: Payer: Self-pay | Admitting: Family Medicine

## 2016-01-08 NOTE — Telephone Encounter (Signed)
Refill appropriate and filled per protocol. 

## 2016-01-09 ENCOUNTER — Telehealth: Payer: Self-pay | Admitting: *Deleted

## 2016-01-09 MED ORDER — TRAZODONE HCL 50 MG PO TABS: 50.0000 mg | ORAL_TABLET | Freq: Every day | ORAL | Status: DC

## 2016-01-09 NOTE — Telephone Encounter (Signed)
Fax received from [facility] phone # [phone], fax # [phone], stating Zolpidem  is no longer covered by insurance.  I spoke with Melanie Richardson this afternoon and let her know.  She sts. she has not tried anything else for sleep.  Per RAS, ok for Trazodone  po qhs prn.  She is agreeable.  Rx. escribed to [facility] [address] per her request/fim

## 2016-01-23 ENCOUNTER — Ambulatory Visit (INDEPENDENT_AMBULATORY_CARE_PROVIDER_SITE_OTHER): Payer: Medicare Other | Admitting: Neurology

## 2016-01-23 ENCOUNTER — Encounter: Payer: Self-pay | Admitting: Neurology

## 2016-01-23 VITALS — BP 168/92 | HR 74 | Resp 18 | Ht 65.0 in | Wt 139.0 lb

## 2016-01-23 DIAGNOSIS — F411 Generalized anxiety disorder: Secondary | ICD-10-CM

## 2016-01-23 DIAGNOSIS — G500 Trigeminal neuralgia: Secondary | ICD-10-CM

## 2016-01-23 DIAGNOSIS — R208 Other disturbances of skin sensation: Secondary | ICD-10-CM

## 2016-01-23 DIAGNOSIS — R5382 Chronic fatigue, unspecified: Secondary | ICD-10-CM

## 2016-01-23 DIAGNOSIS — R269 Unspecified abnormalities of gait and mobility: Secondary | ICD-10-CM

## 2016-01-23 DIAGNOSIS — G35 Multiple sclerosis: Secondary | ICD-10-CM

## 2016-01-23 MED ORDER — DIAZEPAM 5 MG PO TABS: 5.0000 mg | ORAL_TABLET | Freq: Two times a day (BID) | ORAL | Status: DC | PRN

## 2016-01-23 MED ORDER — PREGABALIN 75 MG PO CAPS: 75.0000 mg | ORAL_CAPSULE | Freq: Two times a day (BID) | ORAL | Status: DC

## 2016-01-23 NOTE — Progress Notes (Signed)
[facility]  PATIENT: Melanie Richardson DOB: 26-Jan-1951  REFERRING CLINICIAN: Kingsley Spittle [place] HISTORY FROM: Patient MS   REASON FOR VISIT: MS and trigeminal Neuralgia   HISTORICAL  CHIEF COMPLAINT:  Chief Complaint  Patient presents with  . Multiple Sclerosis    Sts. she continues to tolerate Rebif well.  Denies new or worsening sx./fim    HISTORY OF PRESENT ILLNESS:  Melanie Richardson is a 65 year old woman with multiple sclerosis, trigeminal neuralgia and mood/stress issues.   Her MS is stable.  She denies any recent exacerbation.   She is on Rebif and tolerates it well.   Trigeminal neuralgia:    She has done well the past few months with no major flare-up since summer 2016. When they occur, she gets short sharp spasms of right facial pain. She has received the most benefit from combination of the antiepileptics (Tegretol, Pregabalin and sometimes Lamotrigine) and  Amitriptyline.   She is not needing as much hydrocodone for pain. Stress often triggers a spell.    Many years ago, her spells were so frequent and severe that even moving her mouth to try to eat would trigger more severe pain.    She has also had gamma knife in 2005 and has had radiofrequency ablations twice without much benefit  Gait/strength/sensation:   She feels off balanced at times but no worse than last year.    She feels gait is worse with damp weather and she uses a cane then.  She occasionally stumbles but does not fall.   Her right leg seems weaker when she is hot.  She has no weakness.  She has no numbness in arms or legs.    Bladder:   She denies any bladder issues.      Vision/hearing:   She denies any vision issues.   She feels the right hearing is not as good as the left.      Fatigue/sleep:   Fatigue is mild both days.  She has sleep maintenance > sleep onset insomnia but less than in the past.   One half of a 5 mg Zolpidem with her amitriptyline helps when she can't quickly fall asleep.      Mood/cognition:   She feels a mild depression between separation and father's poor health and mother's death in 75.     She has a lot of marital stress as she is in process of divorce.     Also father has health issues.    LBP/leg pain:  She has radicular back and right leg pain. Pain flares up periodically.    This is helped a lot by Lyrica. She notes she limps some if temperatures are warmer.    MS History:   She had the onset of right sided facial numbness now pregnant during the 1970s. That numbness resolved over the next month or so. However, in the mid 1990s she began to experience pain in the same distribution. She had an MRI of the brain performed by Dr. Buzzy Han in 1998. It was consistent with multiple sclerosis.  She had an LP in the 1970's and the CSF was not significantly abnormal to diagnose MS.   She has been on Rebif since 2004.  She tolerates it well and has not had any recent exacerbation.     REVIEW OF SYSTEMS:  Constitutional: No fevers, chills, sweats, or change in appetite.   Some fatigue and insomnia Eyes: No visual changes, double vision, eye pain Ear, nose and throat: No hearing  loss, ear pain, nasal congestion, sore throat Cardiovascular: No chest pain, palpitations Respiratory:  No shortness of breath at rest or with exertion.   No wheezes GastrointestinaI: No nausea, vomiting, diarrhea, abdominal pain, fecal incontinence Genitourinary:  No dysuria, urinary retention or frequency.  No nocturia. Musculoskeletal:  No neck pain, back pain Integumentary: No rash, pruritus, skin lesions Neurological: as above Psychiatric: No depression at this time.  No anxiety Endocrine: No palpitations, diaphoresis, change in appetite, change in weigh or increased thirst Hematologic/Lymphatic:  No anemia, purpura, petechiae. Allergic/Immunologic: No itchy/runny eyes, nasal congestion, recent allergic reactions, rashes  ALLERGIES: Allergies  Allergen Reactions  . Ciprofloxacin      Dizziness and headache  . Codeine     GI upset  . Erythromycin     HOME MEDICATIONS: Outpatient Prescriptions Prior to Visit  Medication Sig Dispense Refill  . albuterol (PROVENTIL HFA;VENTOLIN HFA) 108 (90 BASE) MCG/ACT inhaler Inhale 2 puffs into the lungs every 6 (six) hours as needed for wheezing. 1 Inhaler 6  . alendronate (FOSAMAX) 70 MG tablet TAKE 1 TABLET BY MOUTH EVERY 7 DAYS W/ FULL GLASS OF WATER ON AN EMPTY STOMACH 4 tablet 12  . amitriptyline (ELAVIL) 50 MG tablet TAKE 1 TABLET BY MOUTH AT BEDTIME. 90 tablet 0  . aspirin 81 MG tablet Take 81 mg by mouth daily.    . carbamazepine (TEGRETOL XR) 400 MG 12 hr tablet TAKE 1 TABLET (400 MG TOTAL) BY MOUTH 3 (THREE) TIMES DAILY. 90 tablet 11  . cholecalciferol (VITAMIN D) 1000 UNITS tablet Take 1,000 Units by mouth daily.    . cycloSPORINE (RESTASIS) 0.05 % ophthalmic emulsion Place 1 drop into both eyes 2 (two) times daily.    . diazepam (VALIUM) 5 MG tablet Take 1 tablet (5 mg total) by mouth every 12 (twelve) hours as needed. 60 tablet 5  . eletriptan (RELPAX) 40 MG tablet Take 1 tablet (40 mg total) by mouth every 2 (two) hours as needed for migraine or headache. One tablet by mouth at onset of headache. May repeat in 2 hours if headache persists or recurs. 12 tablet 0  . fluticasone (FLONASE) 50 MCG/ACT nasal spray PLACE 2 SPRAYS INTO THE NOSE DAILY. 16 g 6  . HYDROcodone-acetaminophen (NORCO) 7.5-325 MG tablet Take 1 tablet by mouth every 6 (six) hours as needed for moderate pain. 120 tablet 0  . ibuprofen (ADVIL,MOTRIN) 200 MG tablet Take 200 mg by mouth every 6 (six) hours as needed.    . interferon beta-1a (REBIF) 44 MCG/0.5ML SOSY injection Inject 0.5 mLs (44 mcg total) into the skin 3 (three) times a week. 36 Syringe 3  . lamoTRIgine (LAMICTAL) 150 MG tablet TAKE 1 TABLET (150 MG TOTAL) BY MOUTH DAILY. 90 tablet 3  . levothyroxine (SYNTHROID, LEVOTHROID) 50 MCG tablet TAKE 1 TABLET BY MOUTH DAILY BEFORE BREAKFAST. 30 tablet  6  . losartan (COZAAR) 100 MG tablet Take 1 tablet (100 mg total) by mouth daily. 30 tablet 6  . losartan (COZAAR) 100 MG tablet TAKE 1 TABLET BY MOUTH EVERY DAY 30 tablet 4  . metoprolol succinate (TOPROL-XL) 25 MG 24 hr tablet TAKE 1 TABLET EVERY DAY 30 tablet 3  . Multiple Vitamin (MULTIVITAMIN) tablet Take 1 tablet by mouth daily.    . nebivolol (BYSTOLIC) 10 MG tablet Take 1 tablet (10 mg total) by mouth daily. 30 tablet 6  . predniSONE (DELTASONE) 10 MG tablet Take 1/2 to 1 tablet by mouth 3 times daily as needed for Trigeminal Neuralgia. 90  tablet 3  . pregabalin (LYRICA) 75 MG capsule Take 1 capsule (75 mg total) by mouth 2 (two) times daily. 60 capsule 5  . promethazine-dextromethorphan (PROMETHAZINE-DM) 6.25-15 MG/5ML syrup Take 5 mLs by mouth 4 (four) times daily as needed for cough.    . traZODone (DESYREL) 50 MG tablet Take 1 tablet (50 mg total) by mouth at bedtime. 30 tablet 5   No facility-administered medications prior to visit.    PAST MEDICAL HISTORY: Past Medical History  Diagnosis Date  . MS (multiple sclerosis) (HCC)   . Trigeminal neuralgia   . Hypertension   . Osteopenia 05/2014    T score -1.8 FRAX 16%/4.7%  . Thyroid disease     Hypothyroid    PAST SURGICAL HISTORY: Past Surgical History  Procedure Laterality Date  . Underarm surgery  1988  . Feet surgery  1990  . Elbow surgery  1999    shattered elbow  . Radiofrquency rhizotomy  [id number]  . Gamma knife  2005  . Back surgery  2011    FAMILY HISTORY: Family History  Problem Relation Age of Onset  . Heart disease Mother   . Hypertension Father   . Cancer Father     colon  . Stroke Father   . Breast cancer Maternal Aunt 50  . Breast cancer Cousin 36    SOCIAL HISTORY:  Social History   Social History  . Marital Status: Married    Spouse Name: N/A  . Number of Children: 2  . Years of Education: college   Occupational History  .  Other   Social History Main Topics  . Smoking status:  Current Every Day Smoker -- 1.00 packs/day    Types: Cigarettes  . Smokeless tobacco: Never Used  . Alcohol Use: No     Comment: rarely  . Drug Use: No  . Sexual Activity: Yes    Birth Control/ Protection: Post-menopausal   Other Topics Concern  . Not on file   Social History Narrative   Patient lives at home alone, has 2 children   Patient is right handed   Educational level is some college   Caffeine consumption is 3 cups a day     PHYSICAL EXAM  Filed Vitals:   01/23/16 1409  BP: 168/92  Pulse: 74  Resp: 18  Height:  (1.651 m)  Weight: 139 lb (63.05 kg)    Body mass index is 23.13 kg/(m^2).   General: The patient is well-developed and well-nourished and in no acute distress    Neurologic Exam  Mental status: The patient is alert and oriented x 3 at the time of the examination. The patient has apparent normal recent and remote memory, with an apparently normal attention span and concentration ability.   Speech is normal.  Cranial nerves: Extraocular movements are full.    There is mildly altered right V2 facial sensation to soft touch.  Facial strength is normal.  Trapezius and sternocleidomastoid strength is normal. No dysarthria is noted.  The tongue is midline, and the patient has symmetric elevation of the soft palate. Hearing is decreased on the right but Weber does not lateralize.    Motor:   Muscle bulk and tone are normal. Strength is  5 / 5 in all 4 extremities.   Sensory: Sensory testing is intact to temperature and touch but vibration decreased in right foot.  Coordination: Cerebellar testing reveals good finger-nose-finger and heel-to-shin bilaterally.  Gait and station: Station and gait are normal but tandem  is wide +/- slight right foot drop. Romberg is negative.   Reflexes: Deep tendon reflexes are symmetric and normal bilaterally.     DIAGNOSTIC DATA (LABS, IMAGING, TESTING) - I reviewed patient records, labs, notes, testing and imaging  myself where available.  Lab Results  Component Value Date   WBC 6.0 07/31/2015   HGB 12.4 07/31/2015   HCT 34.6* 07/31/2015   MCV 92.3 07/31/2015   PLT 155 07/31/2015      Component Value Date/Time   NA 139 07/31/2015 1427   NA 137 04/24/2014 1314   K 3.8 07/31/2015 1427   CL 102 07/31/2015 1427   CO2 25 07/31/2015 1427   GLUCOSE 72 07/31/2015 1427   GLUCOSE 124* 04/24/2014 1314   BUN 14 07/31/2015 1427   BUN 12 04/24/2014 1314   CREATININE 0.86 07/31/2015 1427   CREATININE 0.68 04/24/2014 1314   CALCIUM 9.5 07/31/2015 1427   PROT 6.8 07/31/2015 1427   PROT 6.5 04/24/2014 1314   ALBUMIN 3.8 07/31/2015 1427   ALBUMIN 4.0 04/24/2014 1314   AST 21 07/31/2015 1427   ALT 16 07/31/2015 1427   ALKPHOS 47 07/31/2015 1427   BILITOT 0.4 07/31/2015 1427   GFRNONAA 72 07/31/2015 1427   GFRNONAA 93 04/24/2014 1314   GFRAA 83 07/31/2015 1427   GFRAA 108 04/24/2014 1314    Lab Results  Component Value Date   TSH 3.772 07/31/2015       ASSESSMENT AND PLAN  MS (multiple sclerosis) (HCC)  Trigeminal neuralgia  Abnormality of gait  Anxiety state  Dysesthesia  Chronic fatigue   1.  Continue medications for trigeminal neuralgia.   If Lyrica not covered, consider gabapentin 2.   Continue to exercise stay active. 3.  Contineu Rebif.   Check labs. 4.   Return to clinic 6 months or sooner if there are new or worsening neurologic symptoms.   Richard A. Epimenio Foot, MD, PhD 01/23/2016, 2:16 PM Certified in Neurology, Clinical Neurophysiology, Sleep Medicine, Pain Medicine and Neuroimaging  [facility] [address] [place], [place] [address] [phone]

## 2016-01-24 LAB — CBC WITH DIFFERENTIAL/PLATELET
BASOS: 1 %
Basophils Absolute: 0.1 10*3/uL (ref 0.0–0.2)
EOS (ABSOLUTE): 0.1 10*3/uL (ref 0.0–0.4)
EOS: 1 %
HEMATOCRIT: 39.6 % (ref 34.0–46.6)
HEMOGLOBIN: 13.4 g/dL (ref 11.1–15.9)
IMMATURE GRANS (ABS): 0 10*3/uL (ref 0.0–0.1)
IMMATURE GRANULOCYTES: 0 %
Lymphocytes Absolute: 2.4 10*3/uL (ref 0.7–3.1)
Lymphs: 27 %
MCH: 32.4 pg (ref 26.6–33.0)
MCHC: 33.8 g/dL (ref 31.5–35.7)
MCV: 96 fL (ref 79–97)
MONOCYTES: 10 %
Monocytes Absolute: 0.9 10*3/uL (ref 0.1–0.9)
NEUTROS PCT: 61 %
Neutrophils Absolute: 5.3 10*3/uL (ref 1.4–7.0)
Platelets: 181 10*3/uL (ref 150–379)
RBC: 4.14 x10E6/uL (ref 3.77–5.28)
RDW: 13.4 % (ref 12.3–15.4)
WBC: 8.8 10*3/uL (ref 3.4–10.8)

## 2016-01-24 LAB — HEPATIC FUNCTION PANEL
ALBUMIN: 4.1 g/dL (ref 3.6–4.8)
ALK PHOS: 64 IU/L (ref 39–117)
ALT: 26 IU/L (ref 0–32)
AST: 29 IU/L (ref 0–40)
BILIRUBIN TOTAL: 0.2 mg/dL (ref 0.0–1.2)
BILIRUBIN, DIRECT: 0.09 mg/dL (ref 0.00–0.40)
Total Protein: 7.1 g/dL (ref 6.0–8.5)

## 2016-01-26 ENCOUNTER — Other Ambulatory Visit: Payer: Self-pay | Admitting: Neurology

## 2016-02-04 ENCOUNTER — Encounter: Payer: Self-pay | Admitting: Family Medicine

## 2016-02-04 ENCOUNTER — Telehealth: Payer: Self-pay | Admitting: *Deleted

## 2016-02-04 ENCOUNTER — Ambulatory Visit (INDEPENDENT_AMBULATORY_CARE_PROVIDER_SITE_OTHER): Payer: Medicare Other | Admitting: Family Medicine

## 2016-02-04 VITALS — BP 140/78 | HR 82 | Temp 98.7°F | Resp 14 | Ht 65.0 in | Wt 137.0 lb

## 2016-02-04 DIAGNOSIS — E038 Other specified hypothyroidism: Secondary | ICD-10-CM

## 2016-02-04 DIAGNOSIS — G35 Multiple sclerosis: Secondary | ICD-10-CM | POA: Diagnosis not present

## 2016-02-04 DIAGNOSIS — I10 Essential (primary) hypertension: Secondary | ICD-10-CM | POA: Diagnosis not present

## 2016-02-04 MED ORDER — METOPROLOL SUCCINATE ER 25 MG PO TB24: 25.0000 mg | ORAL_TABLET | Freq: Every day | ORAL | Status: DC

## 2016-02-04 MED ORDER — LOSARTAN POTASSIUM 100 MG PO TABS: 100.0000 mg | ORAL_TABLET | Freq: Every day | ORAL | Status: DC

## 2016-02-04 NOTE — Patient Instructions (Signed)
Continue current medications Schedule mammogram  F/U 6 months

## 2016-02-04 NOTE — Assessment & Plan Note (Signed)
Her blood pressure looks okay today for her age we will continue her current dose of metoprolol and losartan

## 2016-02-04 NOTE — Progress Notes (Signed)
Patient ID: Melanie Richardson, female   DOB: 10/19/1950, 65 y.o.   MRN: [id number]      Subjective:    Patient ID: Melanie Richardson, female    DOB: July 18, 1951, 65 y.o.   MRN: [id number]  Patient presents for 6 month F/U    Here for follow-up on chronic medical problems. She has no particular concerns today. She states that she is doing very well her chronic pain issues in her multiple sclerosis has been stable. She is coming to terms with her divorce and does not need her extra anxiety medications. Actual review of her medications she only bites off a piece of her medicines such as the Tegretol the Lamictal, Valium or the hydrocodone. She does not take a full tablet if any of these medications. These are all prescribed by her neurologist as she states that she has been doing this for quite some time.  She had labs done 6 months ago which were within normal limits with the exception of a mildly elevated cholesterol.    Review Of Systems:  GEN- denies fatigue, fever, weight loss,weakness, recent illness HEENT- denies eye drainage, change in vision, nasal discharge, CVS- denies chest pain, palpitations RESP- denies SOB, cough, wheeze ABD- denies N/V, change in stools, abd pain GU- denies dysuria, hematuria, dribbling, incontinence MSK- +joint pain, muscle aches, injury Neuro- denies headache, dizziness, syncope, seizure activity       Objective:    BP 140/78 mmHg  Pulse 82  Temp(Src) 98.7 F (37.1 C) (Oral)  Resp 14  Ht  (1.651 m)  Wt 137 lb (62.143 kg)  BMI 22.80 kg/m2  LMP 10/06/1996 GEN- NAD, alert and oriented x3 HEENT- PERRL, EOMI, non injected sclera, pink conjunctiva, MMM, oropharynx clear CVS- RRR, no murmur RESP-CTAB EXT- No edema Pulses- Radial 2+        Assessment & Plan:      Problem List Items Addressed This Visit    MS (multiple sclerosis) (HCC) - Primary    Continue meds per neurology. I am concerned about the way she is taking her medications she is  not taking full doses of hardly any of them with the exception of the blood pressure medication. It seems that she has been taking her medications like this for quite some time      Hypothyroidism    Her thyroid function is at goal      Relevant Medications   metoprolol succinate (TOPROL-XL) 25 MG 24 hr tablet   Hypertension    Her blood pressure looks okay today for her age we will continue her current dose of metoprolol and losartan      Relevant Medications   losartan (COZAAR) 100 MG tablet   metoprolol succinate (TOPROL-XL) 25 MG 24 hr tablet      Note: This dictation was prepared with Dragon dictation along with smaller phrase technology. Any transcriptional errors that result from this process are unintentional.

## 2016-02-04 NOTE — Assessment & Plan Note (Addendum)
Her thyroid function is at goal

## 2016-02-04 NOTE — Telephone Encounter (Signed)
-----   Message from Richard A Sater, MD sent at 01/24/2016  1:12 PM EDT ----- Labs are fine. She should continue on Rebif. 

## 2016-02-04 NOTE — Telephone Encounter (Signed)
I have spoken with Melanie Richardson this afternoon and per RAS, advised that labs are ok--she should continue Rebif as rx'd.  She verbalized understanding of same/fim

## 2016-02-04 NOTE — Assessment & Plan Note (Addendum)
Continue meds per neurology. I am concerned about the way she is taking her medications she is not taking full doses of hardly any of them with the exception of the blood pressure medication. It seems that she has been taking her medications like this for quite some time

## 2016-02-04 NOTE — Telephone Encounter (Signed)
-----   Message from Asa Lente, MD sent at 01/24/2016  1:12 PM EDT ----- Labs are fine. She should continue on Rebif.

## 2016-02-08 ENCOUNTER — Other Ambulatory Visit: Payer: Self-pay | Admitting: Family Medicine

## 2016-03-17 ENCOUNTER — Ambulatory Visit (INDEPENDENT_AMBULATORY_CARE_PROVIDER_SITE_OTHER): Payer: Medicare Other | Admitting: Family Medicine

## 2016-03-17 ENCOUNTER — Encounter: Payer: Self-pay | Admitting: Family Medicine

## 2016-03-17 VITALS — BP 142/80 | HR 98 | Temp 100.4°F | Resp 22 | Ht 65.0 in | Wt 136.0 lb

## 2016-03-17 DIAGNOSIS — J019 Acute sinusitis, unspecified: Secondary | ICD-10-CM | POA: Diagnosis not present

## 2016-03-17 MED ORDER — AMOXICILLIN-POT CLAVULANATE 875-125 MG PO TABS: 1.0000 | ORAL_TABLET | Freq: Two times a day (BID) | ORAL | Status: DC

## 2016-03-17 MED ORDER — PREDNISONE 10 MG PO TABS: ORAL_TABLET | ORAL | Status: DC

## 2016-03-17 NOTE — Progress Notes (Signed)
Patient ID: Melanie Richardson, female   DOB: 08-Aug-1951, 65 y.o.   MRN: [id number]    Subjective:    Patient ID: Melanie Richardson, female    DOB: 1951-08-01, 65 y.o.   MRN: [id number]  Patient presents for Illness Here with sinus pressure drainage worsened over the past week or so. The fever started yesterday along with headache. She has not had any nausea vomiting no diarrhea no abdominal pain no rash no tick bites. She has been using her Flonase she also took Sudafed her last dose was this morning she's also using ibuprofen for the headache and fever.   Patient asked if we could give her ReBif shots in the case that she does not have any family members to give these. Okay for her to bring this in  Review Of Systems:  GEN- denies fatigue, fever, weight loss,weakness, recent illness HEENT- denies eye drainage, change in vision, nasal discharge, CVS- denies chest pain, palpitations RESP- denies SOB, cough, wheeze ABD- denies N/V, change in stools, abd pain GU- denies dysuria, hematuria, dribbling, incontinence MSK- denies joint pain, muscle aches, injury Neuro- denies headache, dizziness, syncope, seizure activity       Objective:    BP 142/80 mmHg  Pulse 98  Temp(Src) 100.4 F (38 C) (Oral)  Resp 22  Ht 5\' 5"  (1.651 m)  Wt 136 lb (61.689 kg)  BMI 22.63 kg/m2  SpO2 96%  LMP 10/06/1996 GEN- NAD, alert and oriented x3 HEENT- PERRL, EOMI, non injected sclera, pink conjunctiva, MMM, oropharynx mild injection, TM clear bilat no effusion,  +ethmoid sinus tenderness, inflammed turbinates,  Nasal drainage  Neck- Supple, no LAD CVS- RRR, no murmur RESP-CTAB EXT- No edema Pulses- Radial 2+          Assessment & Plan:      Problem List Items Addressed This Visit    None    Visit Diagnoses    Acute rhinosinusitis    -  Primary    Augmentin BID, increase prednisone to 5mg  daily x 1 week, continue nasal spray, decrease decongestant due to BP    Relevant Medications    amoxicillin-clavulanate (AUGMENTIN) 875-125 MG tablet    predniSONE (DELTASONE) 10 MG tablet       Note: This dictation was prepared with Dragon dictation along with smaller phrase technology. Any transcriptional errors that result from this process are unintentional.

## 2016-03-17 NOTE — Patient Instructions (Signed)
Take antibiotics as prescribed  Take 1/2 tablet of prednisone  F/U as previous

## 2016-03-22 ENCOUNTER — Other Ambulatory Visit: Payer: Self-pay | Admitting: Family Medicine

## 2016-03-25 NOTE — Telephone Encounter (Signed)
Refill appropriate and filled per protocol. 

## 2016-03-30 ENCOUNTER — Other Ambulatory Visit: Payer: Self-pay | Admitting: Family Medicine

## 2016-03-31 ENCOUNTER — Telehealth: Payer: Self-pay | Admitting: Neurology

## 2016-03-31 NOTE — Telephone Encounter (Signed)
Patient is calling and states she has spoken with Raynelle Fanning, nurse with [employer] about getting the ready dose of Ribis 44 mg injection where she can give herself shots. Please call to discuss if needed.

## 2016-03-31 NOTE — Telephone Encounter (Signed)
Noted/fim 

## 2016-03-31 NOTE — Telephone Encounter (Signed)
Refill appropriate and filled per protocol. 

## 2016-04-01 ENCOUNTER — Encounter: Payer: Self-pay | Admitting: *Deleted

## 2016-04-03 ENCOUNTER — Encounter: Payer: Self-pay | Admitting: Physician Assistant

## 2016-04-03 ENCOUNTER — Ambulatory Visit (INDEPENDENT_AMBULATORY_CARE_PROVIDER_SITE_OTHER): Payer: Medicare Other | Admitting: Physician Assistant

## 2016-04-03 VITALS — BP 152/70 | HR 68 | Temp 98.5°F | Resp 16 | Ht 65.0 in | Wt 136.0 lb

## 2016-04-03 DIAGNOSIS — H6123 Impacted cerumen, bilateral: Secondary | ICD-10-CM | POA: Diagnosis not present

## 2016-04-03 DIAGNOSIS — T148 Other injury of unspecified body region: Secondary | ICD-10-CM

## 2016-04-03 DIAGNOSIS — W540XXA Bitten by dog, initial encounter: Secondary | ICD-10-CM

## 2016-04-03 MED ORDER — AMOXICILLIN-POT CLAVULANATE 875-125 MG PO TABS: 1.0000 | ORAL_TABLET | Freq: Two times a day (BID) | ORAL | Status: DC

## 2016-04-03 NOTE — Progress Notes (Signed)
Patient ID: ANNLOUISE GERETY MRN: [id number], DOB: 10-04-1951, 65 y.o. Date of Encounter: 04/03/2016, 2:51 PM    Chief Complaint:  Chief Complaint  Patient presents with  . OTHER    right ear pain - intermitting     HPI: 65 y.o. year old female presents with above.   Says that about 2-1/2 weeks ago she saw Dr. Jeanice Lim and she gave her treatment for sinus infection.  Says that since then off and on she has been feeling some discomfort in her right ear.  Also says that her dog had to have surgery and "he is not happy right now ". Says that when she has to give him his medicine he has nipped her hand a couple of times. Shows me 2 small marks on her right hand.  No other complaints or concerns.     Home Meds:   Outpatient Prescriptions Prior to Visit  Medication Sig Dispense Refill  . albuterol (PROVENTIL HFA;VENTOLIN HFA) 108 (90 BASE) MCG/ACT inhaler Inhale 2 puffs into the lungs every 6 (six) hours as needed for wheezing. 1 Inhaler 6  . alendronate (FOSAMAX) 70 MG tablet TAKE 1 TABLET BY MOUTH EVERY 7 DAYS W/ FULL GLASS OF WATER ON AN EMPTY STOMACH 4 tablet 12  . amitriptyline (ELAVIL) 50 MG tablet TAKE 1 TABLET BY MOUTH AT BEDTIME. 90 tablet 1  . aspirin 81 MG tablet Take 81 mg by mouth daily.    . carbamazepine (TEGRETOL XR) 400 MG 12 hr tablet TAKE 1 TABLET (400 MG TOTAL) BY MOUTH 3 (THREE) TIMES DAILY. 90 tablet 11  . cholecalciferol (VITAMIN D) 1000 UNITS tablet Take 1,000 Units by mouth daily.    . cycloSPORINE (RESTASIS) 0.05 % ophthalmic emulsion Place 1 drop into both eyes 2 (two) times daily.    . diazepam (VALIUM) 5 MG tablet Take 1 tablet (5 mg total) by mouth every 12 (twelve) hours as needed. 60 tablet 5  . eletriptan (RELPAX) 40 MG tablet Take 1 tablet (40 mg total) by mouth every 2 (two) hours as needed for migraine or headache. One tablet by mouth at onset of headache. May repeat in 2 hours if headache persists or recurs. 12 tablet 0  . fluticasone (FLONASE) 50  MCG/ACT nasal spray PLACE 2 SPRAYS INTO THE NOSE DAILY. 16 g 6  . HYDROcodone-acetaminophen (NORCO) 7.5-325 MG tablet Take 1 tablet by mouth every 6 (six) hours as needed for moderate pain. 120 tablet 0  . ibuprofen (ADVIL,MOTRIN) 200 MG tablet Take 200 mg by mouth every 6 (six) hours as needed.    . interferon beta-1a (REBIF) 44 MCG/0.5ML SOSY injection Inject 0.5 mLs (44 mcg total) into the skin 3 (three) times a week. 36 Syringe 3  . lamoTRIgine (LAMICTAL) 150 MG tablet TAKE 1 TABLET (150 MG TOTAL) BY MOUTH DAILY. 90 tablet 3  . levothyroxine (SYNTHROID, LEVOTHROID) 50 MCG tablet TAKE 1 TABLET BY MOUTH DAILY BEFORE BREAKFAST. 30 tablet 6  . losartan (COZAAR) 100 MG tablet TAKE 1 TABLET (100 MG TOTAL) BY MOUTH DAILY. 30 tablet 6  . metoprolol succinate (TOPROL-XL) 25 MG 24 hr tablet Take 1 tablet (25 mg total) by mouth daily. 30 tablet 6  . Multiple Vitamin (MULTIVITAMIN) tablet Take 1 tablet by mouth daily.    . predniSONE (DELTASONE) 10 MG tablet Take 1/2 to 1 tablet by mouth 3 times daily as needed for Trigeminal Neuralgia. 90 tablet 3  . pregabalin (LYRICA) 75 MG capsule Take 1 capsule (75 mg total)  by mouth 2 (two) times daily. 60 capsule 5  . traZODone (DESYREL) 50 MG tablet Take 1 tablet (50 mg total) by mouth at bedtime. 30 tablet 5  . BYSTOLIC 10 MG tablet TAKE 1 TABLET EVERY DAY PA DENIED (Patient not taking: Reported on 04/03/2016) 90 tablet 3  . promethazine-dextromethorphan (PROMETHAZINE-DM) 6.25-15 MG/5ML syrup Take 5 mLs by mouth 4 (four) times daily as needed for cough. Reported on 04/03/2016    . amoxicillin-clavulanate (AUGMENTIN) 875-125 MG tablet Take 1 tablet by mouth 2 (two) times daily. (Patient not taking: Reported on 04/03/2016) 20 tablet 0   No facility-administered medications prior to visit.    Allergies:  Allergies  Allergen Reactions  . Ciprofloxacin     Dizziness and headache  . Codeine     GI upset  . Erythromycin       Review of Systems: See HPI for  pertinent ROS. All other ROS negative.    Physical Exam: Blood pressure 152/70, pulse 68, temperature 98.5 F (36.9 C), temperature source Oral, resp. rate 16, height  (1.651 m), weight 136 lb (61.689 kg), last menstrual period 10/06/1996., Body mass index is 22.63 kg/(m^2). General:  WNWD WF. Appears in no acute distress. HEENT: Normocephalic, atraumatic, eyes without discharge, sclera non-icteric, nares are without discharge. Right ear canal is 85% blocked with cerumen. Left ear canal is 100% blocked with cerumen Oral cavity moist, posterior pharynx without exudate, erythema, peritonsillar abscess, or post nasal drip.  Neck: Supple. No thyromegaly. No lymphadenopathy. Lungs: Clear bilaterally to auscultation without wheezes, rales, or rhonchi. Breathing is unlabored. Heart: Regular rhythm. No murmurs, rubs, or gallops. Msk:  Strength and tone normal for age. Extremities/Skin:  Right hand: Dorsal aspect--near 2nd MCP joint --- there is a ~ 1cm x 2mm laceration--very shallow--only ~2 mm depth. Scabbed. No erythema adjacent to that scab/laceration but there is some pink erythema on surrounding skin. One other smaller laceration at lateral aspect of hand. No erythema or sign of infection at that site.  Neuro: Alert and oriented X 3. Moves all extremities spontaneously. Gait is normal. CNII-XII grossly in tact. Psych:  Responds to questions appropriately with a normal affect.     ASSESSMENT AND PLAN:  65 y.o. year old female with  1. Cerumen impaction, bilateral Irrigate both ears now. Use over-the-counter drops in the future to prevent reoccurrence.  2. Dog bite No definite sign of active infection but given that the bites are "dirty winds "will go ahead and cover with Augmentin. Follow-up if site worsens. - amoxicillin-clavulanate (AUGMENTIN) 875-125 MG tablet; Take 1 tablet by mouth 2 (two) times daily.  Dispense: 14 tablet; Refill: 0   Signed, [address] [place], [place],  [facility] 04/03/2016 2:51 PM

## 2016-04-23 DIAGNOSIS — G35 Multiple sclerosis: Secondary | ICD-10-CM | POA: Diagnosis not present

## 2016-04-23 DIAGNOSIS — H16223 Keratoconjunctivitis sicca, not specified as Sjogren's, bilateral: Secondary | ICD-10-CM | POA: Diagnosis not present

## 2016-04-23 DIAGNOSIS — H04123 Dry eye syndrome of bilateral lacrimal glands: Secondary | ICD-10-CM | POA: Diagnosis not present

## 2016-04-23 DIAGNOSIS — H2513 Age-related nuclear cataract, bilateral: Secondary | ICD-10-CM | POA: Diagnosis not present

## 2016-04-23 DIAGNOSIS — H40001 Preglaucoma, unspecified, right eye: Secondary | ICD-10-CM | POA: Diagnosis not present

## 2016-06-13 ENCOUNTER — Other Ambulatory Visit: Payer: Self-pay | Admitting: Neurology

## 2016-07-17 ENCOUNTER — Telehealth: Payer: Self-pay

## 2016-07-17 NOTE — Telephone Encounter (Signed)
Pt states she is lacking help and is req a antibiotic. Pt states she is congested.   Offered to make Pt an appt  to be seen.Pt stated she was not able to make the appt on 10-13 and asked if I could see if an RX could be sent in.

## 2016-07-18 DIAGNOSIS — Z23 Encounter for immunization: Secondary | ICD-10-CM | POA: Diagnosis not present

## 2016-07-18 NOTE — Telephone Encounter (Signed)
Pt needs to be seen

## 2016-07-18 NOTE — Telephone Encounter (Signed)
Tried  calling  Pt several times on 10-13 to let  Her know to sch an appt no ans VM is full

## 2016-07-30 ENCOUNTER — Other Ambulatory Visit: Payer: Self-pay | Admitting: Neurology

## 2016-07-31 ENCOUNTER — Telehealth: Payer: Self-pay | Admitting: Neurology

## 2016-07-31 NOTE — Telephone Encounter (Signed)
Pt request refill for pregabalin (LYRICA) 75 MG capsule

## 2016-08-01 MED ORDER — PREGABALIN 75 MG PO CAPS: 75.0000 mg | ORAL_CAPSULE | Freq: Two times a day (BID) | ORAL | 5 refills | Status: DC

## 2016-08-01 NOTE — Telephone Encounter (Signed)
Rx. faxed to [facility] with fax confirmation received/fim

## 2016-08-01 NOTE — Telephone Encounter (Signed)
Rx. awaiting YY sig as RAS is ooo this week/fim

## 2016-08-07 ENCOUNTER — Other Ambulatory Visit: Payer: Medicare Other

## 2016-08-07 ENCOUNTER — Encounter: Payer: Self-pay | Admitting: Physician Assistant

## 2016-08-07 ENCOUNTER — Ambulatory Visit (INDEPENDENT_AMBULATORY_CARE_PROVIDER_SITE_OTHER): Payer: Medicare Other | Admitting: Physician Assistant

## 2016-08-07 VITALS — BP 174/92 | HR 75 | Temp 98.2°F | Resp 18 | Wt 137.0 lb

## 2016-08-07 DIAGNOSIS — J0110 Acute frontal sinusitis, unspecified: Secondary | ICD-10-CM | POA: Diagnosis not present

## 2016-08-07 DIAGNOSIS — G35 Multiple sclerosis: Secondary | ICD-10-CM

## 2016-08-07 DIAGNOSIS — E039 Hypothyroidism, unspecified: Secondary | ICD-10-CM

## 2016-08-07 LAB — TSH: TSH: 5.23 m[IU]/L — AB

## 2016-08-07 MED ORDER — PREDNISONE 20 MG PO TABS: ORAL_TABLET | ORAL | 0 refills | Status: DC

## 2016-08-07 MED ORDER — AZITHROMYCIN 250 MG PO TABS: ORAL_TABLET | ORAL | 0 refills | Status: DC

## 2016-08-07 NOTE — Progress Notes (Signed)
Patient ID: Melanie Richardson MRN: [id number], DOB: 05/09/1951, 65 y.o. Date of Encounter: 08/07/2016, 11:37 AM    Chief Complaint:  Chief Complaint  Patient presents with  . sinus problems     HPI: 65 y.o. year old female presents with above.   Says that these symptoms have been going on for about 1-1/2 months. Says that way back at the beginning of the symptoms she went to urgent care and they prescribed amoxicillin. Went back there for follow-up 10 days later because she was not improved but that follow-up appointment they just prescribed an allergy medicine and Flonase which she said she had already been using. Says that she again got no relief. Points to her for head and head and says that "there is so much congestion there ". Says that she feels like there is mucous but also pressure.  Says that this happens to her frequently and that "azithromycin is usually the only thing that will take care of it." No significant sore throat. Not much cough. No fevers or chills.     Home Meds:   Outpatient Medications Prior to Visit  Medication Sig Dispense Refill  . albuterol (PROVENTIL HFA;VENTOLIN HFA) 108 (90 BASE) MCG/ACT inhaler Inhale 2 puffs into the lungs every 6 (six) hours as needed for wheezing. 1 Inhaler 6  . alendronate (FOSAMAX) 70 MG tablet TAKE 1 TABLET BY MOUTH EVERY 7 DAYS W/ FULL GLASS OF WATER ON AN EMPTY STOMACH 4 tablet 12  . amitriptyline (ELAVIL) 50 MG tablet TAKE 1 TABLET BY MOUTH AT BEDTIME. 90 tablet 1  . amoxicillin-clavulanate (AUGMENTIN) 875-125 MG tablet Take 1 tablet by mouth 2 (two) times daily. 14 tablet 0  . aspirin 81 MG tablet Take 81 mg by mouth daily.    Marland Kitchen. BYSTOLIC 10 MG tablet TAKE 1 TABLET EVERY DAY PA DENIED 90 tablet 3  . carbamazepine (TEGRETOL XR) 400 MG 12 hr tablet TAKE 1 TABLET (400 MG TOTAL) BY MOUTH 3 (THREE) TIMES DAILY. 90 tablet 11  . cholecalciferol (VITAMIN D) 1000 UNITS tablet Take 1,000 Units by mouth daily.    . cycloSPORINE  (RESTASIS) 0.05 % ophthalmic emulsion Place 1 drop into both eyes 2 (two) times daily.    . diazepam (VALIUM) 5 MG tablet Take 1 tablet (5 mg total) by mouth every 12 (twelve) hours as needed. 60 tablet 5  . eletriptan (RELPAX) 40 MG tablet Take 1 tablet (40 mg total) by mouth every 2 (two) hours as needed for migraine or headache. One tablet by mouth at onset of headache. May repeat in 2 hours if headache persists or recurs. 12 tablet 0  . fluticasone (FLONASE) 50 MCG/ACT nasal spray PLACE 2 SPRAYS INTO THE NOSE DAILY. 16 g 6  . HYDROcodone-acetaminophen (NORCO) 7.5-325 MG tablet Take 1 tablet by mouth every 6 (six) hours as needed for moderate pain. 120 tablet 0  . ibuprofen (ADVIL,MOTRIN) 200 MG tablet Take 200 mg by mouth every 6 (six) hours as needed.    . interferon beta-1a (REBIF) 44 MCG/0.5ML SOSY injection Inject 0.5 mLs (44 mcg total) into the skin 3 (three) times a week. 36 Syringe 3  . lamoTRIgine (LAMICTAL) 150 MG tablet TAKE 1 TABLET (150 MG TOTAL) BY MOUTH DAILY. 90 tablet 3  . levothyroxine (SYNTHROID, LEVOTHROID) 50 MCG tablet TAKE 1 TABLET BY MOUTH DAILY BEFORE BREAKFAST. 30 tablet 6  . losartan (COZAAR) 100 MG tablet TAKE 1 TABLET (100 MG TOTAL) BY MOUTH DAILY. 30 tablet 6  .  metoprolol succinate (TOPROL-XL) 25 MG 24 hr tablet Take 1 tablet (25 mg total) by mouth daily. 30 tablet 6  . Multiple Vitamin (MULTIVITAMIN) tablet Take 1 tablet by mouth daily.    . predniSONE (DELTASONE) 10 MG tablet Take 1/2 to 1 tablet by mouth 3 times daily as needed for Trigeminal Neuralgia. 90 tablet 3  . pregabalin (LYRICA) 75 MG capsule Take 1 capsule (75 mg total) by mouth 2 (two) times daily. 60 capsule 5  . promethazine-dextromethorphan (PROMETHAZINE-DM) 6.25-15 MG/5ML syrup Take 5 mLs by mouth 4 (four) times daily as needed for cough. Reported on 04/03/2016    . REBIF REBIDOSE 44 MCG/0.5ML SOAJ INJECT ONE PEN (44 MCG) SUBCUTANEOUSLY THREE TIMES PER WEEK. KEEP REFRIGERATED. 36 Syringe 3  .  traZODone (DESYREL) 50 MG tablet Take 1 tablet (50 mg total) by mouth at bedtime. 30 tablet 5   No facility-administered medications prior to visit.     Allergies:  Allergies  Allergen Reactions  . Ciprofloxacin     Dizziness and headache  . Codeine     GI upset  . Erythromycin       Review of Systems: See HPI for pertinent ROS. All other ROS negative.    Physical Exam: 164/88--Blood pressure , pulse 75, temperature 98.2 F (36.8 C), temperature source Oral, resp. rate 18, weight 137 lb (62.1 kg), last menstrual period 10/06/1996, SpO2 98 %., Body mass index is 22.8 kg/m. General:  Petite WF. Appears in no acute distress. HEENT: Normocephalic, atraumatic, eyes without discharge, sclera non-icteric, nares are without discharge. Bilateral auditory canals clear, TM's are without perforation, pearly grey and translucent with reflective cone of light bilaterally. Oral cavity moist, posterior pharynx without exudate, erythema, peritonsillar abscess. There is tenderness with percussion to frontal and maxillary sinuses.  Neck: Supple. No thyromegaly. No lymphadenopathy. Lungs: Clear bilaterally to auscultation without wheezes, rales, or rhonchi. Breathing is unlabored. Heart: Regular rhythm. No murmurs, rubs, or gallops. Msk:  Strength and tone normal for age. Extremities/Skin: Warm and dry. Neuro: Alert and oriented X 3. Moves all extremities spontaneously. Gait is normal. CNII-XII grossly in tact. Psych:  Responds to questions appropriately with a normal affect.     ASSESSMENT AND PLAN:  65 y.o. year old female with  1. Acute frontal sinusitis, recurrence not specified She is to take the azithromycin and the prednisone as directed. Follow-up if symptoms do not resolve after completion of these. (Discussed that she is on a low-dose prednisone long-term for her trigeminal neuralgia. She is to hold that prednisone while she is taking this higher dose prednisone taper.) - azithromycin  (ZITHROMAX) 250 MG tablet; Day 1: take 2 daily.  Days 2-5: Take 1 daily.  Dispense: 6 tablet; Refill: 0 - predniSONE (DELTASONE) 20 MG tablet; Take 3 daily for 2 days, then 2 daily for 2 days, then 1 daily for 2 days.  Dispense: 12 tablet; Refill: 0   Signed, [address] [place], [place], [facility] 08/07/2016 11:37 AM

## 2016-08-08 LAB — BASIC METABOLIC PANEL
BUN: 9 mg/dL (ref 7–25)
CALCIUM: 8.9 mg/dL (ref 8.6–10.4)
CO2: 23 mmol/L (ref 20–31)
CREATININE: 0.67 mg/dL (ref 0.50–0.99)
Chloride: 97 mmol/L — ABNORMAL LOW (ref 98–110)
GLUCOSE: 72 mg/dL (ref 70–99)
POTASSIUM: 3.6 mmol/L (ref 3.5–5.3)
Sodium: 130 mmol/L — ABNORMAL LOW (ref 135–146)

## 2016-08-11 ENCOUNTER — Encounter: Payer: Self-pay | Admitting: Family Medicine

## 2016-08-11 ENCOUNTER — Ambulatory Visit (INDEPENDENT_AMBULATORY_CARE_PROVIDER_SITE_OTHER): Payer: Medicare Other | Admitting: Family Medicine

## 2016-08-11 VITALS — BP 184/106 | HR 96 | Temp 98.7°F | Resp 18 | Wt 133.0 lb

## 2016-08-11 DIAGNOSIS — E039 Hypothyroidism, unspecified: Secondary | ICD-10-CM | POA: Diagnosis not present

## 2016-08-11 DIAGNOSIS — E871 Hypo-osmolality and hyponatremia: Secondary | ICD-10-CM | POA: Diagnosis not present

## 2016-08-11 DIAGNOSIS — I10 Essential (primary) hypertension: Secondary | ICD-10-CM | POA: Diagnosis not present

## 2016-08-11 DIAGNOSIS — J302 Other seasonal allergic rhinitis: Secondary | ICD-10-CM | POA: Diagnosis not present

## 2016-08-11 MED ORDER — LORATADINE 10 MG PO TABS: 10.0000 mg | ORAL_TABLET | Freq: Every day | ORAL | 11 refills | Status: DC

## 2016-08-11 MED ORDER — METOPROLOL SUCCINATE ER 50 MG PO TB24: 50.0000 mg | ORAL_TABLET | Freq: Every day | ORAL | 6 refills | Status: DC

## 2016-08-11 NOTE — Patient Instructions (Signed)
Add nasal saline Use the loratadine  Toprol 50mg  once a day for blood pressure  We will call with lab results  F/U 2 weeks

## 2016-08-11 NOTE — Assessment & Plan Note (Signed)
Blood pressure uncontrolled. I'll increase her Toprol to 50 mg once a day she will continue the losartan at the current dose. She will check her blood pressure at home she will follow up in office in 2 weeks. For the hyponatremia on rechecking her metabolic panel today

## 2016-08-11 NOTE — Assessment & Plan Note (Signed)
Underlying allergic rhinitis recent sinusitis. I do not think she needs any further antibiotics. Advised nasal saline for rinsing of the sinuses also continue the antihistamine No decongestants with her significantly elevated blood pressure

## 2016-08-11 NOTE — Progress Notes (Signed)
Subjective:    Patient ID: Melanie Richardson, female    DOB: 05/31/1951, 65 y.o.   MRN: [id number]  Patient presents for 6 mth follow up (trouble with allergies)  Patient here to follow-up chronic medical problems. She states that she has been taking her medicines as prescribed. Her blood pressure significantly elevated at 184/106 she was here Last Thursday blood pressure is elevated at 174/92 at that time she is being treated for her allergies/ Sinus infection.  He continues to have some sinus pressure and drainage. She completed Z-Pak yesterday and .note she also went to urgent care was given loratadine and Augmentin which did not help. She was also given a prednisone taper she is on a low-dose of chronic prednisone secondary to her multiple sclerosis.  She misses she's been under some stress recently she finalized her divorce but is still going to paperwork for the financial aspect. Her father passed away at the end of September her dog was ran over she feels like she is doing everything on her own.  All of her psychiatric medications are prescribed by her neurologist was also treating for her MS  Recent labs reviewed she had mild hyponatremia as well as a mild elevation in her TSH has been on a stable dose of Synthroid for a few years now  Review Of Systems:  GEN- + fatigue, fever, weight loss,weakness, recent illness HEENT- denies eye drainage, change in vision, +nasal discharge, CVS- denies chest pain, palpitations RESP- denies SOB, cough, wheeze ABD- denies N/V, change in stools, abd pain GU- denies dysuria, hematuria, dribbling, incontinence MSK- denies joint pain, muscle aches, injury Neuro- denies headache, dizziness, syncope, seizure activity       Objective:    BP (!) 184/106 (BP Location: Left Arm, Patient Position: Sitting, Cuff Size: Normal) Comment: x2  Pulse 96   Temp 98.7 F (37.1 C) (Oral)   Resp 18   Wt 133 lb (60.3 kg)   LMP 10/06/1996   BMI 22.13 kg/m   GEN- NAD, alert and oriented x3 HEENT- PERRL, EOMI, non injected sclera, pink conjunctiva, MMM, oropharynx clear,nares enlarged turbinates, clear rhinorrhea, no maxillary sinus tenderness  Neck- Supple, no thyromegaly CVS- RRR, no murmur RESP-CTAB ABD-NABS,soft,NT,ND EXT- No edema Pulses- Radial, DP- 2+        Assessment & Plan:      Problem List Items Addressed This Visit    Hypothyroidism - Primary    Recheck TFT in a few weeks before adjusting as acutely ill      Relevant Medications   metoprolol succinate (TOPROL-XL) 50 MG 24 hr tablet   Hypertension    Blood pressure uncontrolled. I'll increase her Toprol to 50 mg once a day she will continue the losartan at the current dose. She will check her blood pressure at home she will follow up in office in 2 weeks. For the hyponatremia on rechecking her metabolic panel today      Relevant Medications   metoprolol succinate (TOPROL-XL) 50 MG 24 hr tablet   Allergic rhinitis    Underlying allergic rhinitis recent sinusitis. I do not think she needs any further antibiotics. Advised nasal saline for rinsing of the sinuses also continue the antihistamine No decongestants with her significantly elevated blood pressure       Other Visit Diagnoses    Hyponatremia       Relevant Orders   Basic metabolic panel      Note: This dictation was prepared with Dragon dictation along with smaller  Company secretary. Any transcriptional errors that result from this process are unintentional.

## 2016-08-11 NOTE — Assessment & Plan Note (Signed)
Recheck TFT in a few weeks before adjusting as acutely ill

## 2016-08-12 LAB — BASIC METABOLIC PANEL
BUN: 10 mg/dL (ref 7–25)
CALCIUM: 9.2 mg/dL (ref 8.6–10.4)
CO2: 26 mmol/L (ref 20–31)
Chloride: 99 mmol/L (ref 98–110)
Creat: 0.65 mg/dL (ref 0.50–0.99)
GLUCOSE: 96 mg/dL (ref 70–99)
Potassium: 4.1 mmol/L (ref 3.5–5.3)
SODIUM: 132 mmol/L — AB (ref 135–146)

## 2016-08-26 ENCOUNTER — Encounter: Payer: Self-pay | Admitting: Neurology

## 2016-08-26 ENCOUNTER — Encounter: Payer: Self-pay | Admitting: *Deleted

## 2016-08-26 ENCOUNTER — Ambulatory Visit (INDEPENDENT_AMBULATORY_CARE_PROVIDER_SITE_OTHER): Payer: Medicare Other | Admitting: Neurology

## 2016-08-26 VITALS — BP 150/90 | HR 80 | Resp 18 | Ht 65.0 in | Wt 133.5 lb

## 2016-08-26 DIAGNOSIS — R5382 Chronic fatigue, unspecified: Secondary | ICD-10-CM

## 2016-08-26 DIAGNOSIS — R269 Unspecified abnormalities of gait and mobility: Secondary | ICD-10-CM | POA: Diagnosis not present

## 2016-08-26 DIAGNOSIS — R208 Other disturbances of skin sensation: Secondary | ICD-10-CM | POA: Diagnosis not present

## 2016-08-26 DIAGNOSIS — G500 Trigeminal neuralgia: Secondary | ICD-10-CM

## 2016-08-26 DIAGNOSIS — G35 Multiple sclerosis: Secondary | ICD-10-CM

## 2016-08-26 MED ORDER — HYDROCODONE-ACETAMINOPHEN 7.5-325 MG PO TABS: 1.0000 | ORAL_TABLET | Freq: Four times a day (QID) | ORAL | 0 refills | Status: DC | PRN

## 2016-08-26 NOTE — Telephone Encounter (Signed)
[facility]  [facility] Dr. Epimenio FootSater in the office today, dropped her rx. in the room before she left.  I didn't find it before she left--will place it up front [facility] for her to pick up/fim

## 2016-08-26 NOTE — Progress Notes (Signed)
[facility]  PATIENT: Melanie Richardson DOB: 08/09/1951  REFERRING CLINICIAN: Kingsley SpittleKawanta Thaxton HISTORY FROM: Patient MS   REASON FOR VISIT: MS and trigeminal Neuralgia   HISTORICAL  CHIEF COMPLAINT:  Chief Complaint  Patient presents with  . Multiple Sclerosis    Sts. she continues to tolerate Rebif well.  Sts. she  has been under some more stress, with the recent death of her father in September 2017, and also a recent divorce./fim    HISTORY OF PRESENT ILLNESS:  Melanie Richardson is a 65 year old woman with multiple sclerosis, trigeminal neuralgia and mood/stress issues.     MS:   Her MS is stable.  She denies any recent exacerbation.   She is on Rebif and tolerates it well.   Trigeminal neuralgia:    She has done mostly well since the last visit with only one major flare-up.  She had short sharp spasms of right facial pain. She has received the most benefit from combination of the antiepileptics (Tegretol, Pregabalin and sometimes Lamotrigine) and Amitriptyline.   She is not needing as much hydrocodone for pain. Stress often triggers a spell.    In the past, her spells were so frequent and severe that activity like eating would trigger more severe pain.    She has also had gamma knife in 2005 and has had radiofrequency ablations twice without much benefit  Gait/strength/sensation:   She feels off balanced at times but no worse than last year.    Gait is worse with weather changes and she uses a cane then.  She occasionally stumbles but does not fall.   She has no major weakness. But heer right leg seems weaker when she is hot.   She has no numbness in arms or legs.    Bladder:   She denies any bladder issues.      Vision/hearing:   She denies any vision issues.   She feels the right hearing is not as good as the left.      Fatigue/sleep:   Fatigue is mild both days.  She has occasional sleep maintenance > sleep onset insomnia.   This was worse in the past.   One half  of a 5 mg Zolpidem with her amitriptyline helps when she can't quickly fall asleep.     Mood/cognition:   Her father recently died and just went through a divorce.  She feels more stress but depression is not worse.   LBP/leg pain:  She has occasional right radicular and back pain.  This is helped a lot by Lyrica. She notes she limps some if temperatures are warmer.    MS History:   She had the onset of right sided facial numbness now pregnant during the 1970s. That numbness resolved over the next month or so. However, in the mid 1990s she began to experience pain in the same distribution. She had an MRI of the brain performed by Dr. Buzzy HanStiefel in 1998. It was consistent with multiple sclerosis.  She had an LP in the 1970's and the CSF was not significantly abnormal to diagnose MS.   She has been on Rebif since 2004.  She tolerates it well and has not had any recent exacerbation.     REVIEW OF SYSTEMS:  Constitutional: No fevers, chills, sweats, or change in appetite.   Some fatigue and insomnia Eyes: No visual changes, double vision, eye pain Ear, nose and throat: No hearing loss, ear pain, nasal congestion, sore throat Cardiovascular: No chest pain, palpitations  Respiratory:  No shortness of breath at rest or with exertion.   No wheezes GastrointestinaI: No nausea, vomiting, diarrhea, abdominal pain, fecal incontinence Genitourinary:  No dysuria, urinary retention or frequency.  No nocturia. Musculoskeletal:  No neck pain, back pain Integumentary: No rash, pruritus, skin lesions Neurological: as above Psychiatric: No depression at this time.  No anxiety Endocrine: No palpitations, diaphoresis, change in appetite, change in weigh or increased thirst Hematologic/Lymphatic:  No anemia, purpura, petechiae. Allergic/Immunologic: No itchy/runny eyes, nasal congestion, recent allergic reactions, rashes  ALLERGIES: Allergies  Allergen Reactions  . Ciprofloxacin     Dizziness and headache  .  Codeine     GI upset  . Erythromycin     HOME MEDICATIONS: Outpatient Medications Prior to Visit  Medication Sig Dispense Refill  . albuterol (PROVENTIL HFA;VENTOLIN HFA) 108 (90 BASE) MCG/ACT inhaler Inhale 2 puffs into the lungs every 6 (six) hours as needed for wheezing. 1 Inhaler 6  . alendronate (FOSAMAX) 70 MG tablet TAKE 1 TABLET BY MOUTH EVERY 7 DAYS W/ FULL GLASS OF WATER ON AN EMPTY STOMACH 4 tablet 12  . amitriptyline (ELAVIL) 50 MG tablet TAKE 1 TABLET BY MOUTH AT BEDTIME. 90 tablet 1  . aspirin 81 MG tablet Take 81 mg by mouth daily.    . carbamazepine (TEGRETOL XR) 400 MG 12 hr tablet TAKE 1 TABLET (400 MG TOTAL) BY MOUTH 3 (THREE) TIMES DAILY. 90 tablet 11  . cholecalciferol (VITAMIN D) 1000 UNITS tablet Take 1,000 Units by mouth daily.    . cycloSPORINE (RESTASIS) 0.05 % ophthalmic emulsion Place 1 drop into both eyes 2 (two) times daily.    . diazepam (VALIUM) 5 MG tablet Take 1 tablet (5 mg total) by mouth every 12 (twelve) hours as needed. 60 tablet 5  . fluticasone (FLONASE) 50 MCG/ACT nasal spray PLACE 2 SPRAYS INTO THE NOSE DAILY. 16 g 6  . ibuprofen (ADVIL,MOTRIN) 200 MG tablet Take 200 mg by mouth every 6 (six) hours as needed.    . interferon beta-1a (REBIF) 44 MCG/0.5ML SOSY injection Inject 0.5 mLs (44 mcg total) into the skin 3 (three) times a week. 36 Syringe 3  . lamoTRIgine (LAMICTAL) 150 MG tablet TAKE 1 TABLET (150 MG TOTAL) BY MOUTH DAILY. 90 tablet 3  . levothyroxine (SYNTHROID, LEVOTHROID) 50 MCG tablet TAKE 1 TABLET BY MOUTH DAILY BEFORE BREAKFAST. 30 tablet 6  . loratadine (CLARITIN) 10 MG tablet Take 1 tablet (10 mg total) by mouth daily. 30 tablet 11  . losartan (COZAAR) 100 MG tablet TAKE 1 TABLET (100 MG TOTAL) BY MOUTH DAILY. 30 tablet 6  . metoprolol succinate (TOPROL-XL) 50 MG 24 hr tablet Take 1 tablet (50 mg total) by mouth daily. 30 tablet 6  . Multiple Vitamin (MULTIVITAMIN) tablet Take 1 tablet by mouth daily.    . predniSONE (DELTASONE) 20  MG tablet Take 3 daily for 2 days, then 2 daily for 2 days, then 1 daily for 2 days. 12 tablet 0  . pregabalin (LYRICA) 75 MG capsule Take 1 capsule (75 mg total) by mouth 2 (two) times daily. 60 capsule 5  . REBIF REBIDOSE 44 MCG/0.5ML SOAJ INJECT ONE PEN (44 MCG) SUBCUTANEOUSLY THREE TIMES PER WEEK. KEEP REFRIGERATED. 36 Syringe 3  . HYDROcodone-acetaminophen (NORCO) 7.5-325 MG tablet Take 1 tablet by mouth every 6 (six) hours as needed for moderate pain. 120 tablet 0  . eletriptan (RELPAX) 40 MG tablet Take 1 tablet (40 mg total) by mouth every 2 (two) hours as needed for  migraine or headache. One tablet by mouth at onset of headache. May repeat in 2 hours if headache persists or recurs. (Patient not taking: Reported on 08/26/2016) 12 tablet 0  . traZODone (DESYREL) 50 MG tablet Take 1 tablet (50 mg total) by mouth at bedtime. (Patient not taking: Reported on 08/26/2016) 30 tablet 5   No facility-administered medications prior to visit.     PAST MEDICAL HISTORY: Past Medical History:  Diagnosis Date  . Hypertension   . MS (multiple sclerosis) (HCC)   . Osteopenia 05/2014   T score -1.8 FRAX 16%/4.7%  . Thyroid disease    Hypothyroid  . Trigeminal neuralgia     PAST SURGICAL HISTORY: Past Surgical History:  Procedure Laterality Date  . BACK SURGERY  2011  . ELBOW SURGERY  1999   shattered elbow  . feet surgery  1990  . gamma knife  2005  . radiofrquency rhizotomy  [id number]  . underarm surgery  1988    FAMILY HISTORY: Family History  Problem Relation Age of Onset  . Heart disease Mother   . Hypertension Father   . Cancer Father     colon  . Stroke Father   . Breast cancer Maternal Aunt 50  . Breast cancer Cousin 80    SOCIAL HISTORY:  Social History   Social History  . Marital status: Married    Spouse name: N/A  . Number of children: 2  . Years of education: college   Occupational History  .  Other   Social History Main Topics  . Smoking status: Current  Every Day Smoker    Packs/day: 1.00    Types: Cigarettes  . Smokeless tobacco: Never Used  . Alcohol use No     Comment: rarely  . Drug use: No  . Sexual activity: Yes    Birth control/ protection: Post-menopausal   Other Topics Concern  . Not on file   Social History Narrative   Patient lives at home alone, has 2 children   Patient is right handed   Educational level is some college   Caffeine consumption is 3 cups a day     PHYSICAL EXAM  Vitals:   08/26/16 1438  BP: (!) 150/90  Pulse: 80  Resp: 18  Weight: 133 lb 8 oz (60.6 kg)  Height: 5\' 5"  (1.651 m)    Body mass index is 22.22 kg/m.   General: The patient is well-developed and well-nourished and in no acute distress    Neurologic Exam  Mental status: The patient is alert and oriented x 3 at the time of the examination. The patient has apparent normal recent and remote memory, with an apparently normal attention span and concentration ability.   Speech is normal.  Cranial nerves: Extraocular movements are full.    There is mildly reduced right V2 facial sensation to soft touch.  Facial strength is normal.  Trapezius and sternocleidomastoid strength is normal. No dysarthria is noted.  The tongue is midline, and the patient has symmetric elevation of the soft palate. Hearing is decreased on the right but [place] does not lateralize.    Motor:   Muscle bulk and tone are normal. Strength is  5 / 5 in all 4 extremities.   Sensory: Sensory testing is intact to temperature and touch but vibration decreased in right foot.  Coordination: Cerebellar testing reveals good finger-nose-finger and heel-to-shin bilaterally.  Gait and station: Station is normal and gait has reduced stride.  Tandem is wide with mild right  foot drop. Romberg is negative.   Reflexes: Deep tendon reflexes are symmetric and normal bilaterally.     DIAGNOSTIC DATA (LABS, IMAGING, TESTING) - I reviewed patient records, labs, notes, testing and  imaging myself where available.  Lab Results  Component Value Date   WBC 8.8 01/23/2016   HGB 12.4 07/31/2015   HCT 39.6 01/23/2016   MCV 96 01/23/2016   PLT 181 01/23/2016      Component Value Date/Time   NA 132 (L) 08/11/2016 1500   NA 137 04/24/2014 1314   K 4.1 08/11/2016 1500   CL 99 08/11/2016 1500   CO2 26 08/11/2016 1500   GLUCOSE 96 08/11/2016 1500   BUN 10 08/11/2016 1500   BUN 12 04/24/2014 1314   CREATININE 0.65 08/11/2016 1500   CALCIUM 9.2 08/11/2016 1500   PROT 7.1 01/23/2016 1440   ALBUMIN 4.1 01/23/2016 1440   AST 29 01/23/2016 1440   ALT 26 01/23/2016 1440   ALKPHOS 64 01/23/2016 1440   BILITOT 0.2 01/23/2016 1440   GFRNONAA 72 07/31/2015 1427   GFRAA 83 07/31/2015 1427    Lab Results  Component Value Date   TSH 5.23 (H) 08/07/2016       ASSESSMENT AND PLAN  MS (multiple sclerosis) (HCC)  Trigeminal neuralgia  Dysesthesia  Abnormality of gait  Chronic fatigue   1.  Continue medications for trigeminal neuralgia.   Lyrica is expensive for her. If this worsens or becomes a problem,, consider gabapentin 2.   Exercise and stay active. 3.  Continue Rebif.   4.   Return to clinic 6 months or sooner if there are new or worsening neurologic symptoms.   Smt. Loder A. Epimenio Foot, MD, PhD 08/26/2016, 3:03 PM Certified in Neurology, Clinical Neurophysiology, Sleep Medicine, Pain Medicine and Neuroimaging  [facility] [address] [place], [place] [address] [phone]

## 2016-10-07 ENCOUNTER — Ambulatory Visit (INDEPENDENT_AMBULATORY_CARE_PROVIDER_SITE_OTHER): Payer: Medicare Other | Admitting: Gynecology

## 2016-10-07 ENCOUNTER — Encounter: Payer: Self-pay | Admitting: Gynecology

## 2016-10-07 VITALS — BP 140/86 | Ht 65.0 in | Wt 133.0 lb

## 2016-10-07 DIAGNOSIS — Z01411 Encounter for gynecological examination (general) (routine) with abnormal findings: Secondary | ICD-10-CM | POA: Diagnosis not present

## 2016-10-07 DIAGNOSIS — M899 Disorder of bone, unspecified: Secondary | ICD-10-CM

## 2016-10-07 DIAGNOSIS — N952 Postmenopausal atrophic vaginitis: Secondary | ICD-10-CM

## 2016-10-07 NOTE — Patient Instructions (Signed)
Follow up for Bone density as scheduled  Schedule your colonoscopy with either:  [facility]   Address: [address] [place], [place], [place] [address]  [phone]    or  [facility]  Address: [address] [place], [place], [place] [address]  [phone]   Call to Schedule your mammogram  Facilities in [place]: 1)  [facility], [phone] Phone: [phone] 2)  Dr. Yolanda Bonine at [facility] [address] Phone: [phone]     Mammogram A mammogram is an X-ray test to find changes in a woman's breast. You should get a mammogram if:  You are 20 years of age or older  You have risk factors.   Your doctor recommends that you have one.  BEFORE THE TEST  Do not schedule the test the week before your period, especially if your breasts are sore during this time.  On the day of your mammogram:  Wash your breasts and armpits well. After washing, do not put on any deodorant or talcum powder on until after your test.   Eat and drink as you usually do.   Take your medicines as usual.   If you are diabetic and take insulin, make sure you:   Eat before coming for your test.   Take your insulin as usual.   If you cannot keep your appointment, call before the appointment to cancel. Schedule another appointment.  TEST  You will need to undress from the waist up. You will put on a hospital gown.   Your breast will be put on the mammogram machine, and it will press firmly on your breast with a piece of plastic called a compression paddle. This will make your breast flatter so that the machine can X-ray all parts of your breast.   Both breasts will be X-rayed. Each breast will be X-rayed from above and from the side. An X-ray might need to be taken again if the picture is not good enough.   The mammogram will last about 15 to 30 minutes.  AFTER THE TEST Finding out the  results of your test Ask when your test results will be ready. Make sure you get your test results.  Document Released: 12/19/2008 Document Revised: 09/11/2011 Document Reviewed: 12/19/2008 [facility] Patient Information 2012 [place], [place].

## 2016-10-07 NOTE — Progress Notes (Signed)
    Melanie Richardson [birth year] [id number]        66 y.o.  G2P2 for breast and pelvic exam  Past medical history,surgical history, problem list, medications, allergies, family history and social history were all reviewed and documented as reviewed in the EPIC chart.  ROS:  Performed with pertinent positives and negatives included in the history, assessment and plan.   Additional significant findings :  None   Exam: Kennon Portela assistant Vitals:   10/07/16 1403  BP: 140/86  Weight: 133 lb (60.3 kg)  Height: 5\' 5"  (1.651 m)   Body mass index is 22.13 kg/m.  General appearance:  Normal affect, orientation and appearance. Skin: Grossly normal HEENT: Without gross lesions.  No cervical or supraclavicular adenopathy. Thyroid normal.  Lungs:  Clear without wheezing, rales or rhonchi Cardiac: RR, without RMG Abdominal:  Soft, nontender, without masses, guarding, rebound, organomegaly or hernia Breasts:  Examined lying and sitting without masses, retractions, discharge or axillary adenopathy. Pelvic:  Ext, BUS, Vagina with atrophic changes  Cervix with atrophic changes  Uterus anteverted, normal size, shape and contour, midline and mobile nontender   Adnexa without masses or tenderness    Anus and perineum normal   Rectovaginal normal sphincter tone without palpated masses or tenderness.    Assessment/Plan:  66 y.o. G2P2 female for breast and pelvic exam  1. Postmenopausal/atrophic genital changes. No significant hot flushes, night sweats, vaginal dryness or any bleeding. Continue monitor reading the issues or bleeding. 2. Osteopenia. DEXA 2015 T score -1.8 FRAX 16%/4.7%. On alendronate for 2 years now. Check baseline DEXA now patient will schedule. Continue on alendronate for now. 3. Pap smear/HPV 2014. No Pap smear done today. No history of significant abnormal Pap smears. Plan repeat Pap smear approaching 5 year interval per current screening guidelines. 4. Mammography 2013.  Patient knows she is overdue and agrees to call and schedule. Names and numbers provided. SBE monthly reviewed. 5. Colonoscopy never. I again strongly recommended patient schedule a screening colonoscopy. Second most common cancer in women reviewed. Patient indulges my recommendation. 6. Health maintenance. No routine lab work done as this is done elsewhere. Mild elevated blood pressure at 140/86 reviewed. We'll recheck on exam situation. Follow up with primary if remains elevated. Follow up for DEXA, mammography and colonoscopy. Follow up in one year for annual exam.   [name], 2:22 PM 10/07/2016

## 2016-11-04 ENCOUNTER — Other Ambulatory Visit: Payer: Self-pay | Admitting: Gynecology

## 2016-11-09 ENCOUNTER — Other Ambulatory Visit: Payer: Self-pay | Admitting: Family Medicine

## 2016-11-10 ENCOUNTER — Telehealth: Payer: Self-pay | Admitting: *Deleted

## 2016-11-10 NOTE — Telephone Encounter (Signed)
Rebif PA completed by phone with Part D [employer], [employer] Part D [employer], phone# [phone].  She was dx. with MS in 1998, has been on Rebif since 2004.  No other meds tried, but she tolerates Rebif well and is stable on it, so 1--changing meds at this time would place her at risk for relapse and permanent disability, and 2--as she is stable on Rebif, the risks associated with stronger disease modifying meds can't be justified/fim

## 2016-11-11 NOTE — Telephone Encounter (Signed)
Fax received from [employer] [phone]).  Rebif PA approved effective 10-04-16 thru 10-05-17.  Referral # [id number].  Member ID# [id number]

## 2016-12-24 ENCOUNTER — Emergency Department (HOSPITAL_COMMUNITY)
Admission: EM | Admit: 2016-12-24 | Discharge: 2016-12-24 | Disposition: A | Discharge status: Home / Self Care | Payer: Medicare Other | Attending: Emergency Medicine | Admitting: Emergency Medicine

## 2016-12-24 ENCOUNTER — Encounter (HOSPITAL_COMMUNITY): Payer: Self-pay | Admitting: Emergency Medicine

## 2016-12-24 ENCOUNTER — Emergency Department (HOSPITAL_COMMUNITY): Payer: Medicare Other

## 2016-12-24 DIAGNOSIS — Z7982 Long term (current) use of aspirin: Secondary | ICD-10-CM | POA: Diagnosis not present

## 2016-12-24 DIAGNOSIS — G510 Bell's palsy: Secondary | ICD-10-CM | POA: Insufficient documentation

## 2016-12-24 DIAGNOSIS — R4781 Slurred speech: Secondary | ICD-10-CM | POA: Diagnosis not present

## 2016-12-24 DIAGNOSIS — F17210 Nicotine dependence, cigarettes, uncomplicated: Secondary | ICD-10-CM | POA: Diagnosis not present

## 2016-12-24 DIAGNOSIS — I10 Essential (primary) hypertension: Secondary | ICD-10-CM

## 2016-12-24 DIAGNOSIS — E039 Hypothyroidism, unspecified: Secondary | ICD-10-CM | POA: Insufficient documentation

## 2016-12-24 DIAGNOSIS — I6789 Other cerebrovascular disease: Secondary | ICD-10-CM | POA: Diagnosis not present

## 2016-12-24 DIAGNOSIS — Z79899 Other long term (current) drug therapy: Secondary | ICD-10-CM | POA: Diagnosis not present

## 2016-12-24 DIAGNOSIS — R29810 Facial weakness: Secondary | ICD-10-CM | POA: Diagnosis not present

## 2016-12-24 DIAGNOSIS — R531 Weakness: Secondary | ICD-10-CM | POA: Diagnosis present

## 2016-12-24 LAB — COMPREHENSIVE METABOLIC PANEL
ALBUMIN: 3.6 g/dL (ref 3.5–5.0)
ALT: 20 U/L (ref 14–54)
ANION GAP: 10 (ref 5–15)
AST: 28 U/L (ref 15–41)
Alkaline Phosphatase: 56 U/L (ref 38–126)
BILIRUBIN TOTAL: 0.9 mg/dL (ref 0.3–1.2)
BUN: 11 mg/dL (ref 6–20)
CHLORIDE: 101 mmol/L (ref 101–111)
CO2: 23 mmol/L (ref 22–32)
Calcium: 9.1 mg/dL (ref 8.9–10.3)
Creatinine, Ser: 0.72 mg/dL (ref 0.44–1.00)
GFR calc Af Amer: >60 mL/min (ref >60)
GFR calc non Af Amer: >60 mL/min (ref >60)
GLUCOSE: 97 mg/dL (ref 65–99)
POTASSIUM: 3.8 mmol/L (ref 3.5–5.1)
SODIUM: 134 mmol/L — AB (ref 135–145)
Total Protein: 7 g/dL (ref 6.5–8.1)

## 2016-12-24 LAB — CBC WITH DIFFERENTIAL/PLATELET
Basophils Absolute: 0.1 10*3/uL (ref 0.0–0.1)
Basophils Relative: 1 %
EOS PCT: 1 %
Eosinophils Absolute: 0.1 10*3/uL (ref 0.0–0.7)
HEMATOCRIT: 39.4 % (ref 36.0–46.0)
Hemoglobin: 13.8 g/dL (ref 12.0–15.0)
LYMPHS ABS: 1.9 10*3/uL (ref 0.7–4.0)
Lymphocytes Relative: 27 %
MCH: 32.7 pg (ref 26.0–34.0)
MCHC: 35 g/dL (ref 30.0–36.0)
MCV: 93.4 fL (ref 78.0–100.0)
Monocytes Absolute: 0.8 10*3/uL (ref 0.1–1.0)
Monocytes Relative: 11 %
NEUTROS ABS: 4.2 10*3/uL (ref 1.7–7.7)
Neutrophils Relative %: 60 %
PLATELETS: 188 10*3/uL (ref 150–400)
RBC: 4.22 MIL/uL (ref 3.87–5.11)
RDW: 13 % (ref 11.5–15.5)
WBC: 7 10*3/uL (ref 4.0–10.5)

## 2016-12-24 LAB — URINALYSIS, ROUTINE W REFLEX MICROSCOPIC
BACTERIA UA: NONE SEEN
BILIRUBIN URINE: NEGATIVE
Glucose, UA: NEGATIVE mg/dL
Hgb urine dipstick: NEGATIVE
KETONES UR: NEGATIVE mg/dL
LEUKOCYTES UA: NEGATIVE
Nitrite: NEGATIVE
PH: 7 (ref 5.0–8.0)
PROTEIN: 30 mg/dL — AB
SQUAMOUS EPITHELIAL / LPF: NONE SEEN
Specific Gravity, Urine: 1.01 (ref 1.005–1.030)

## 2016-12-24 MED ORDER — PREDNISONE 20 MG PO TABS: 40.0000 mg | ORAL_TABLET | Freq: Every day | ORAL | 0 refills | Status: DC

## 2016-12-24 MED ORDER — METOPROLOL SUCCINATE ER 25 MG PO TB24: 75.0000 mg | ORAL_TABLET | Freq: Every day | ORAL | 0 refills | Status: DC

## 2016-12-24 MED ORDER — VALACYCLOVIR HCL 1 G PO TABS: 1000.0000 mg | ORAL_TABLET | Freq: Three times a day (TID) | ORAL | 0 refills | Status: DC

## 2016-12-24 NOTE — ED Notes (Signed)
Pt transported to CT. NAD. VSS. 

## 2016-12-24 NOTE — ED Provider Notes (Signed)
.   Please see previous physicians note regarding patient's presenting history and physical, initial ED course, and associated medical decision making.  66 year old female with MS who presents with right facial droop c/w bell's palsy. b/c of MS, previous physician spoke with Dr. Roxy Manns, who recommended steroids for bell's palsy and outpatient neurology follow-up. To follow-up on UA at time of sign out. UA negative for infection. Has been hypertensive in ED, and Dr. Clarene Duke to titrate up on metoprolol with close PCP follow-up.  Discharged with steroids and antiviral medications for Bell's palsy.   The patient appears reasonably screened and/or stabilized for discharge and I doubt any other medical condition or other [facility] requiring further screening, evaluation, or treatment in the ED at this time prior to discharge.  Strict return and follow-up instructions reviewed. She expressed understanding of all discharge instructions and felt comfortable with the plan of care.    Lavera Guise, MD 12/24/16 [phone]

## 2016-12-24 NOTE — Discharge Instructions (Signed)
1. Please contact [name] for follow up appointment. 2. Please contact your primary care doctor for follow up appointment to discuss your blood pressure. 3. IT IS IMPORTANT TO TAPE YOUR EYE CLOSED AT NIGHT WHEN SLEEPING TO AVOID SCRATCHING YOUR EYE.

## 2016-12-24 NOTE — ED Triage Notes (Signed)
Pt to ER by [facility] from home where EMS was activated for worsening weakness over the last few days, initial change noted Friday by care taker, stated to EMS that patient began showing right facial droop Friday. Today patient reports that when she has MS flare ups it affects her right side. Reports her gait is off today, she is having worsened weakness and slurred speech. Pt is HTN at 196/133, HR 82. Pt is a/o x4.

## 2016-12-24 NOTE — ED Provider Notes (Signed)
[facility]-EMERGENCY DEPT Provider Note   CSN: [id number] Arrival date & time: 12/24/16 1344     History    Chief Complaint  Patient presents with  . Weakness     HPI Melanie Richardson is a 66 y.o. female.  Adelaida.Potts F w/ PMH including MS, HTN, Trigeminal neuralgia who presents with facial droop. Patient reports that 5 days ago, her caregiver began noticing some right-sided facial droop that has worsened since it began. She also noticed some speech problems associated with it. Patient initially thought that it was related to her MS or trigeminal neuralgia which has always affected her right side of her face. Family reports that for the past 2-3 months she has had slightly worse gait problems with some stumbling, and no significant changes over the past several days. She denies any extremity weakness or numbness. No headache. No fevers or recent illness. No recent changes to her medications. She is compliant with all of her medicines.   Past Medical History:  Diagnosis Date  . Hypertension   . MS (multiple sclerosis) (HCC)   . Osteopenia 05/2014   T score -1.8 FRAX 16%/4.7%  . Thyroid disease    Hypothyroid  . Trigeminal neuralgia      Patient Active Problem List   Diagnosis Date Noted  . Allergic rhinitis 08/07/2015  . Hypertension 08/06/2015  . Hypertriglyceridemia 08/06/2015  . Anxiety state 07/25/2015  . Dysesthesia 10/26/2014  . Abnormality of gait 10/26/2014  . Chronic fatigue 10/26/2014  . Sinusitis, acute 07/17/2013  . Wheezing 07/17/2013  . Hypothyroidism 07/17/2013  . MS (multiple sclerosis) (HCC)   . Trigeminal neuralgia     Past Surgical History:  Procedure Laterality Date  . BACK SURGERY  2011  . ELBOW SURGERY  1999   shattered elbow  . feet surgery  1990  . gamma knife  2005  . radiofrquency rhizotomy  [id number]  . underarm surgery  1988    OB History    Gravida Para Term Preterm AB Living   2 2       2    SAB TAB Ectopic Multiple Live Births           Home Medications    Prior to Admission medications   Medication Sig Start Date End Date Taking? Authorizing Provider  alendronate (FOSAMAX) 70 MG tablet TAKE 1 TABLET BY MOUTH EVERY 7 DAYS WITH FULL GLASS OF WATER ON AN EMPTY STOMACH 11/04/16  Yes Dara Lords, MD  amitriptyline (ELAVIL) 50 MG tablet TAKE 1 TABLET BY MOUTH AT BEDTIME. 07/30/16  Yes Asa Lente, MD  aspirin 81 MG tablet Take 81 mg by mouth daily.   Yes Historical Provider, MD  carbamazepine (TEGRETOL XR) 400 MG 12 hr tablet TAKE 1 TABLET (400 MG TOTAL) BY MOUTH 3 (THREE) TIMES DAILY. 12/13/15  Yes Asa Lente, MD  Cholecalciferol (VITAMIN D PO) Take 400 Units by mouth daily.    Yes Historical Provider, MD  cycloSPORINE (RESTASIS) 0.05 % ophthalmic emulsion Place 1 drop into both eyes 2 (two) times daily.   Yes Historical Provider, MD  diazepam (VALIUM) 5 MG tablet Take 1 tablet (5 mg total) by mouth every 12 (twelve) hours as needed. Patient taking differently: Take 5 mg by mouth every 12 (twelve) hours as needed for anxiety.  01/23/16  Yes Richard Aida Puffer, MD  fluticasone (FLONASE) 50 MCG/ACT nasal spray PLACE 2 SPRAYS INTO THE NOSE DAILY. Patient taking differently: Place 2 sprays into both nostrils daily as needed for  allergies.  08/06/15  Yes Salley Scarlet, MD  HYDROcodone-acetaminophen (NORCO) 7.5-325 MG tablet Take 1 tablet by mouth every 6 (six) hours as needed for moderate pain. 08/26/16  Yes Asa Lente, MD  ibuprofen (ADVIL,MOTRIN) 200 MG tablet Take 200 mg by mouth every 6 (six) hours as needed for headache or moderate pain.    Yes Historical Provider, MD  interferon beta-1a (REBIF) 44 MCG/0.5ML SOSY injection Inject 0.5 mLs (44 mcg total) into the skin 3 (three) times a week. 12/04/15  Yes Asa Lente, MD  lamoTRIgine (LAMICTAL) 150 MG tablet TAKE 1 TABLET (150 MG TOTAL) BY MOUTH DAILY. 10/28/14  Yes Asa Lente, MD  levothyroxine (SYNTHROID, LEVOTHROID) 50 MCG tablet TAKE 1 TABLET BY  MOUTH EVERY DAY BEFORE BREAKFAST 11/10/16  Yes Salley Scarlet, MD  Liniments [facility] EX) Apply 1 patch topically as needed (for pain).   Yes Historical Provider, MD  losartan (COZAAR) 100 MG tablet TAKE 1 TABLET (100 MG TOTAL) BY MOUTH DAILY. 03/25/16  Yes Salley Scarlet, MD  Menthol, Topical Analgesic, (BENGAY EX) Apply 1 application topically as needed (for pain).   Yes Historical Provider, MD  Multiple Vitamin (MULTIVITAMIN) tablet Take 1 tablet by mouth daily.   Yes Historical Provider, MD  nicotine polacrilex (NICORETTE) 2 MG gum Take 2 mg by mouth as needed for smoking cessation.   Yes Historical Provider, MD  pregabalin (LYRICA) 75 MG capsule Take 1 capsule (75 mg total) by mouth 2 (two) times daily. 08/01/16  Yes Levert Feinstein, MD  albuterol (PROVENTIL HFA;VENTOLIN HFA) 108 (90 BASE) MCG/ACT inhaler Inhale 2 puffs into the lungs every 6 (six) hours as needed for wheezing. 08/06/15   Salley Scarlet, MD  eletriptan (RELPAX) 40 MG tablet Take 1 tablet (40 mg total) by mouth every 2 (two) hours as needed for migraine or headache. One tablet by mouth at onset of headache. May repeat in 2 hours if headache persists or recurs. 10/24/13   Ramond Marrow, DO  loratadine (CLARITIN) 10 MG tablet Take 1 tablet (10 mg total) by mouth daily. Patient taking differently: Take 10 mg by mouth daily as needed for allergies.  08/11/16   Salley Scarlet, MD  metoprolol succinate (TOPROL-XL) 25 MG 24 hr tablet Take 3 tablets (75 mg total) by mouth daily. 12/24/16 01/07/17  Melanie Spates, MD  predniSONE (DELTASONE) 20 MG tablet Take 2 tablets (40 mg total) by mouth daily. Take 40 mg by mouth daily for 3 days, then 20mg  by mouth daily for 3 days, then 10mg  daily for 3 days 12/24/16   Melanie Spates, MD  REBIF REBIDOSE 44 MCG/0.5ML SOAJ INJECT ONE PEN (44 MCG) SUBCUTANEOUSLY THREE TIMES PER WEEK. KEEP REFRIGERATED. Patient not taking: Reported on 12/24/2016 06/13/16   Asa Lente, MD  traZODone (DESYREL) 50  MG tablet Take 1 tablet (50 mg total) by mouth at bedtime. Patient not taking: Reported on 12/24/2016 01/09/16   Asa Lente, MD  valACYclovir (VALTREX) 1000 MG tablet Take 1 tablet (1,000 mg total) by mouth 3 (three) times daily. 12/24/16   Melanie Spates, MD      Family History  Problem Relation Age of Onset  . Heart disease Mother   . Hypertension Father   . Cancer Father     colon  . Stroke Father   . Breast cancer Maternal Aunt 50  . Breast cancer Cousin 4     Social History  Substance Use Topics  . Smoking  status: Current Every Day Smoker    Packs/day: 1.00    Types: Cigarettes  . Smokeless tobacco: Never Used  . Alcohol use 0.0 oz/week     Comment: rarely     Allergies     Ciprofloxacin; Codeine; and Erythromycin    Review of Systems  10 Systems reviewed and are negative for acute change except as noted in the HPI.   Physical Exam Updated Vital Signs BP (!) 186/95   Pulse 75   Temp 98.8 F (37.1 C)   Resp 12   LMP 10/06/1996   SpO2 94%   Physical Exam  Constitutional: She is oriented to person, place, and time. She appears well-developed and well-nourished. No distress.  Awake, alert  HENT:  Head: Normocephalic and atraumatic.  Eyes: Conjunctivae and EOM are normal. Pupils are equal, round, and reactive to light.  Neck: Neck supple.  Cardiovascular: Normal rate, regular rhythm and normal heart sounds.   No murmur heard. Pulmonary/Chest: Effort normal and breath sounds normal. No respiratory distress.  Abdominal: Soft. Bowel sounds are normal. She exhibits no distension. There is no tenderness.  Musculoskeletal: She exhibits no edema.  Neurological: She is alert and oriented to person, place, and time. She has normal reflexes. No cranial nerve deficit. She exhibits normal muscle tone.  R sided facial droop involving forehead, cheek and mouth; speech slightly affected by mouth droop but fluent with no word-finding difficulty; normal  finger-to-nose testing, negative pronator drift, no clonus 5/5 strength and normal sensation x all 4 extremities Mildly decreased sensation R face (chronic)   Skin: Skin is warm and dry.  Psychiatric: She has a normal mood and affect. Judgment and thought content normal.  Nursing note and vitals reviewed.     ED Treatments / Results  Labs (all labs ordered are listed, but only abnormal results are displayed) Labs Reviewed  COMPREHENSIVE METABOLIC PANEL - Abnormal; Notable for the following:       Result Value   Sodium 134 (*)    All other components within normal limits  CBC WITH DIFFERENTIAL/PLATELET  URINALYSIS, ROUTINE W REFLEX MICROSCOPIC     EKG  EKG Interpretation  Date/Time:  Wednesday December 24 2016 13:48:38 EDT Ventricular Rate:  84 PR Interval:    QRS Duration: 86 QT Interval:  434 QTC Calculation: 514 R Axis:   68 Text Interpretation:  Sinus rhythm Consider left ventricular hypertrophy Anterior Q waves, possibly due to LVH Nonspecific T abnormalities, lateral leads Prolonged QT interval No significant change since last tracing Confirmed by Delpha Perko MD, Corri Delapaz [phone]) on 12/24/2016 2:03:54 PM         Radiology Ct Head Wo Contrast  Result Date: 12/24/2016 CLINICAL DATA:  Hypertension. Slurred speech with facial droop on right side for several days Today patient reports that when she has MS flare ups it affects her right side. Reports her gait is off today, she is having worsened weakness. EXAM: CT HEAD WITHOUT CONTRAST TECHNIQUE: Contiguous axial images were obtained from the base of the skull through the vertex without intravenous contrast. COMPARISON:  Brain MR of 11/16/2014 ; report not available. Most recent head CT of 10/06/2005. FINDINGS: Brain: Moderate-to-marked periventricular white matter hypoattenuation. Cerebellar atrophy is age advanced and chronic. There is also cerebral atrophy. No mass lesion, hemorrhage, hydrocephalus, acute infarct, intra-axial, or  extra-axial fluid collection. Vascular: Intracranial atherosclerosis. Skull: Left frontal scalp 2.2 cm low-density lesion is new since 2007. Example image 55/series 4. Normal skull. Sinuses/Orbits: Normal imaged portions of the orbits and  globes. Right maxillary sinus mucous retention cyst or polyp. Clear mastoid air cells. Other: None IMPRESSION: 1.  No acute intracranial abnormality. 2. Cerebral and cerebellar atrophy. Advanced periventricular white matter hypoattenuation is progressive since 2007. Likely mildly progressive since 11/16/2014 MRI. Small vessel ischemic change and/or multiple sclerosis. 3. Possible sebaceous cyst about the scalp. Consider physical exam correlation. Electronically Signed   By: Jeronimo Greaves M.D.   On: 12/24/2016 15:08    Procedures Procedures (including critical care time) Procedures  Medications Ordered in ED  Medications - No data to display   Initial Impression / Assessment and Plan / ED Course  I have reviewed the triage vital signs and the nursing notes.  Pertinent labs & imaging results that were available during my care of the patient were reviewed by me and considered in my medical decision making (see chart for details).    Pt w/ h/o 5 days of R facial droop, reports of generalized weakness. She was awake and alert, GCS of 15. She had a right facial droop involving  forehead with difficulty closing right eye. She endorsed mild right facial numbness which is chronic due to trigeminal neuralgia treatment. The remainder of her neurologic exam was normal including normal strength and sensation of extremities. Obtained basic labs which were unremarkable. Obtained head CT because patient has been hypertensive here, to rule out acute bleed. Head CT negative acute. I discussed her presentation with neurologist, Dr. Roxy Manns, as her sx . Consistent with Bell's palsy but her medical history is complex. He felt that her symptoms most likely did represent Bell's palsy but  even if this did represent an MS flare, he would not recommend admission for IV steroids. He recommended usual treatment with steroid taper and Valtrex and follow-up with her outpatient neurologist. I discussed this plan with the patient. Discussed supportive care instructions including taping eye at night. The patient's blood pressure has been elevated here and appears that it was elevated at last PCP visit. I have increased the patient's metoprolol and instructed her to contact PCP for close follow-up for blood pressure management. We are awaiting urinalysis and I anticipate discharge after completion of UA. Patient and family have voiced understanding of treatment and follow-up plan.  Final Clinical Impressions(s) / ED Diagnoses   Final diagnoses:  Bell's palsy  Essential hypertension     New Prescriptions   METOPROLOL SUCCINATE (TOPROL-XL) 25 MG 24 HR TABLET    Take 3 tablets (75 mg total) by mouth daily.   PREDNISONE (DELTASONE) 20 MG TABLET    Take 2 tablets (40 mg total) by mouth daily. Take 40 mg by mouth daily for 3 days, then 20mg  by mouth daily for 3 days, then 10mg  daily for 3 days   VALACYCLOVIR (VALTREX) 1000 MG TABLET    Take 1 tablet (1,000 mg total) by mouth 3 (three) times daily.       Melanie Spates, MD 12/24/16 [phone]

## 2016-12-25 ENCOUNTER — Telehealth: Payer: Self-pay | Admitting: Neurology

## 2016-12-25 NOTE — Telephone Encounter (Signed)
Patient called office in reference to scheduling a hospital follow up.  Patient went to ED yesterday diagnosed with Bell's Palsy.  Patient has an appointment with Dr. Epimenio Foot in May is patient able to be seen sooner with NP?  Please call

## 2016-12-25 NOTE — Telephone Encounter (Signed)
Rn spoke with patient about Dr .Epimenio Foot seeing her for newly diagnose bells palsy. Pt sees him for MS follow up. 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 Pt will call her PCP to cancel her appt at 345 and see them at a later date.

## 2016-12-26 ENCOUNTER — Other Ambulatory Visit: Payer: Self-pay | Admitting: Neurology

## 2016-12-29 ENCOUNTER — Ambulatory Visit (INDEPENDENT_AMBULATORY_CARE_PROVIDER_SITE_OTHER): Payer: Medicare Other | Admitting: Neurology

## 2016-12-29 ENCOUNTER — Encounter: Payer: Self-pay | Admitting: Neurology

## 2016-12-29 ENCOUNTER — Ambulatory Visit: Payer: Medicare Other | Admitting: Family Medicine

## 2016-12-29 VITALS — BP 205/120 | HR 87 | Resp 16 | Ht 65.0 in | Wt 127.0 lb

## 2016-12-29 DIAGNOSIS — G35 Multiple sclerosis: Secondary | ICD-10-CM | POA: Diagnosis not present

## 2016-12-29 DIAGNOSIS — G500 Trigeminal neuralgia: Secondary | ICD-10-CM | POA: Diagnosis not present

## 2016-12-29 DIAGNOSIS — I10 Essential (primary) hypertension: Secondary | ICD-10-CM | POA: Diagnosis not present

## 2016-12-29 DIAGNOSIS — G510 Bell's palsy: Secondary | ICD-10-CM

## 2016-12-29 DIAGNOSIS — R269 Unspecified abnormalities of gait and mobility: Secondary | ICD-10-CM

## 2016-12-29 MED ORDER — HYDROCHLOROTHIAZIDE 25 MG PO TABS: 25.0000 mg | ORAL_TABLET | ORAL | 5 refills | Status: DC

## 2016-12-29 NOTE — Progress Notes (Signed)
[facility]  PATIENT: Melanie Richardson DOB: [birth year]  REFERRING CLINICIAN: Kingsley Spittle Reynolds HISTORY FROM: Patient MS   REASON FOR VISIT: MS and trigeminal Neuralgia   HISTORICAL  CHIEF COMPLAINT:  Chief Complaint  Patient presents with  . Multiple Sclerosis    Sts. about 6 weeks ago, she was walking into her pharmacy, missed a step,  and fell, hitting back left side of  head on concrete,  Doesn't think she had loc, Onset of right sided facial paralysis about 3-4 weeks ago.  Had a sinus infection just prior to dx. of Bell's Palsy/fim  . Bell's Palsy  . Head Injury    HISTORY OF PRESENT ILLNESS:  Melanie Richardson is a 66 year old woman with multiple sclerosis an right trigeminal neuralgia who had the onset of facial weakness on her right 2 weeks ago.  .     About 7 weeks ago, she had what she feels was a sinus infection and felt off balanced.   She fell in a parking lot, hitting her head but did not lose consciousness.   About 2 weeks ago, her son noted the right face was drooping and she went to [facility] ED last week.  She did not note headache at the onset.   She noted mild lip numbness but the weakness was the main symptom   She notes water dribbling out of her mouth when she drank.    She noted mild change in vision last week but back to baseline.   .    No change in leg or arm numbness or weakness.  No change in gait or bladder.     In the ED, she was given Valtrex and prednisone.  She had a CT of the head.   It shows White matter changes c/w her MS and a scalp hematoma on her left.    MS:   She is on Rebif and tolerates it well.    Before current symptoms, she had no recent exacerbation.  Trigeminal neuralgia:    Her pain has done fairly well. She has had only occasional spasms of pain on the right side of her face..   She has received the most benefit from combination of the antiepileptics (Tegretol, Pregabalin and Lamotrigine) and Amitriptyline.   She is not needing as  much hydrocodone for pain. Stress often triggers a spell.    In the past, her spells were so frequent and severe that activity like eating would trigger more severe pain.    She has also had gamma knife in 2005 and has had radiofrequency ablations twice without much benefit.   Those procedures left her with reduced sensation on the right.  Gait/strength/sensation:   She feels off balanced and had the one fall.  Gait is worse the past 1/2 year in general, especially the past 1-2 months. .    She now must use a cane.  She has no major weakness, but her right leg seems weaker when she is hot.   She has no numbness in arms or legs.    Bladder:   She denies any bladder issues.      Vision/hearing:   She feels vision is about the same but notes some blurry vision last week, .   She feels the right hearing is not as good as the left.      Fatigue/sleep:   Fatigue is mild both days.  She has sleep maintenance > sleep onset insomnia some nights.   She is restless  at night sometimes and if she wakes up may not fall back asleep.   This was worse in the past.   One half of a 5 mg Zolpidem with her amitriptyline helps when she can't quickly fall asleep.     Mood/cognition:   She notes depression and mild anxiety and has had more stress the past year.   Her father recently died and just went through a divorce.    LBP/leg pain:  She has occasional right radicular and back pain.  This is helped a lot by Lyrica. She notes she limps some if temperatures are warmer.    MS History:   She had the onset of right sided facial numbness now pregnant during the 1970s. That numbness resolved over the next month or so. However, in the mid 1990s she began to experience pain in the same distribution. She had an MRI of the brain performed by Dr. Buzzy Han in 1998. It was consistent with multiple sclerosis.  She had an LP in the 1970's and the CSF was not significantly abnormal to diagnose MS.   She has been on Rebif since 2004.  She  tolerates it well and has not had any recent exacerbation.     REVIEW OF SYSTEMS:  Constitutional: No fevers, chills, sweats, or change in appetite.   Some fatigue and insomnia Eyes: No visual changes, double vision, eye pain Ear, nose and throat: No hearing loss, ear pain, nasal congestion, sore throat Cardiovascular: No chest pain, palpitations Respiratory:  No shortness of breath at rest or with exertion.   No wheezes GastrointestinaI: No nausea, vomiting, diarrhea, abdominal pain, fecal incontinence Genitourinary:  No dysuria, urinary retention or frequency.  No nocturia. Musculoskeletal:  No neck pain, back pain Integumentary: No rash, pruritus, skin lesions Neurological: as above Psychiatric: No depression at this time.  No anxiety Endocrine: No palpitations, diaphoresis, change in appetite, change in weigh or increased thirst Hematologic/Lymphatic:  No anemia, purpura, petechiae. Allergic/Immunologic: No itchy/runny eyes, nasal congestion, recent allergic reactions, rashes  ALLERGIES: Allergies  Allergen Reactions  . Ciprofloxacin Other (See Comments)    Dizziness and headache  . Codeine Other (See Comments)    GI upset  . Erythromycin Other (See Comments)    Unknown    HOME MEDICATIONS: Outpatient Medications Prior to Visit  Medication Sig Dispense Refill  . albuterol (PROVENTIL HFA;VENTOLIN HFA) 108 (90 BASE) MCG/ACT inhaler Inhale 2 puffs into the lungs every 6 (six) hours as needed for wheezing. 1 Inhaler 6  . alendronate (FOSAMAX) 70 MG tablet TAKE 1 TABLET BY MOUTH EVERY 7 DAYS WITH FULL GLASS OF WATER ON AN EMPTY STOMACH 4 tablet 12  . amitriptyline (ELAVIL) 50 MG tablet TAKE 1 TABLET BY MOUTH AT BEDTIME. 90 tablet 1  . aspirin 81 MG tablet Take 81 mg by mouth daily.    . carbamazepine (TEGRETOL XR) 400 MG 12 hr tablet TAKE 1 TABLET (400 MG TOTAL) BY MOUTH 3 (THREE) TIMES DAILY. 90 tablet 11  . Cholecalciferol (VITAMIN D PO) Take 400 Units by mouth daily.     .  cycloSPORINE (RESTASIS) 0.05 % ophthalmic emulsion Place 1 drop into both eyes 2 (two) times daily.    . diazepam (VALIUM) 5 MG tablet Take 1 tablet (5 mg total) by mouth every 12 (twelve) hours as needed. (Patient taking differently: Take 5 mg by mouth every 12 (twelve) hours as needed for anxiety. ) 60 tablet 5  . eletriptan (RELPAX) 40 MG tablet Take 1 tablet (40 mg total)  by mouth every 2 (two) hours as needed for migraine or headache. One tablet by mouth at onset of headache. May repeat in 2 hours if headache persists or recurs. 12 tablet 0  . fluticasone (FLONASE) 50 MCG/ACT nasal spray PLACE 2 SPRAYS INTO THE NOSE DAILY. (Patient taking differently: Place 2 sprays into both nostrils daily as needed for allergies. ) 16 g 6  . HYDROcodone-acetaminophen (NORCO) 7.5-325 MG tablet Take 1 tablet by mouth every 6 (six) hours as needed for moderate pain. 120 tablet 0  . ibuprofen (ADVIL,MOTRIN) 200 MG tablet Take 200 mg by mouth every 6 (six) hours as needed for headache or moderate pain.     Marland Kitchen interferon beta-1a (REBIF) 44 MCG/0.5ML SOSY injection Inject 0.5 mLs (44 mcg total) into the skin 3 (three) times a week. 36 Syringe 3  . lamoTRIgine (LAMICTAL) 150 MG tablet TAKE 1 TABLET (150 MG TOTAL) BY MOUTH DAILY. 90 tablet 3  . levothyroxine (SYNTHROID, LEVOTHROID) 50 MCG tablet TAKE 1 TABLET BY MOUTH EVERY DAY BEFORE BREAKFAST 30 tablet 6  . Liniments (SALONPAS EX) Apply 1 patch topically as needed (for pain).    Marland Kitchen loratadine (CLARITIN) 10 MG tablet Take 1 tablet (10 mg total) by mouth daily. (Patient taking differently: Take 10 mg by mouth daily as needed for allergies. ) 30 tablet 11  . losartan (COZAAR) 100 MG tablet TAKE 1 TABLET (100 MG TOTAL) BY MOUTH DAILY. 30 tablet 6  . Menthol, Topical Analgesic, (BENGAY EX) Apply 1 application topically as needed (for pain).    . metoprolol succinate (TOPROL-XL) 25 MG 24 hr tablet Take 3 tablets (75 mg total) by mouth daily. 42 tablet 0  . Multiple Vitamin  (MULTIVITAMIN) tablet Take 1 tablet by mouth daily.    . nicotine polacrilex (NICORETTE) 2 MG gum Take 2 mg by mouth as needed for smoking cessation.    . predniSONE (DELTASONE) 20 MG tablet Take 2 tablets (40 mg total) by mouth daily. Take 40 mg by mouth daily for 3 days, then 20mg  by mouth daily for 3 days, then 10mg  daily for 3 days 12 tablet 0  . pregabalin (LYRICA) 75 MG capsule Take 1 capsule (75 mg total) by mouth 2 (two) times daily. 60 capsule 5  . REBIF REBIDOSE 44 MCG/0.5ML SOAJ INJECT ONE PEN (44 MCG) SUBCUTANEOUSLY THREE TIMES PER WEEK. KEEP REFRIGERATED. (Patient not taking: Reported on 12/24/2016) 36 Syringe 3  . traZODone (DESYREL) 50 MG tablet Take 1 tablet (50 mg total) by mouth at bedtime. (Patient not taking: Reported on 12/24/2016) 30 tablet 5  . valACYclovir (VALTREX) 1000 MG tablet Take 1 tablet (1,000 mg total) by mouth 3 (three) times daily. 21 tablet 0   No facility-administered medications prior to visit.     PAST MEDICAL HISTORY: Past Medical History:  Diagnosis Date  . Hypertension   . MS (multiple sclerosis) (HCC)   . Osteopenia 05/2014   T score -1.8 FRAX 16%/4.7%  . Thyroid disease    Hypothyroid  . Trigeminal neuralgia     PAST SURGICAL HISTORY: Past Surgical History:  Procedure Laterality Date  . BACK SURGERY  2011  . ELBOW SURGERY  1999   shattered elbow  . feet surgery  1990  . gamma knife  2005  . radiofrquency rhizotomy  [id number]  . underarm surgery  1988    FAMILY HISTORY: Family History  Problem Relation Age of Onset  . Heart disease Mother   . Hypertension Father   . Cancer Father  colon  . Stroke Father   . Breast cancer Maternal Aunt 50  . Breast cancer Cousin 67    SOCIAL HISTORY:  Social History   Social History  . Marital status: Married    Spouse name: N/A  . Number of children: 2  . Years of education: college   Occupational History  .  Other   Social History Main Topics  . Smoking status: Current Every Day  Smoker    Packs/day: 1.00    Types: Cigarettes  . Smokeless tobacco: Never Used  . Alcohol use 0.0 oz/week     Comment: rarely  . Drug use: No  . Sexual activity: Yes    Birth control/ protection: Post-menopausal     Comment: 1st intercourse 66 yo-Fewer than 5 partners   Other Topics Concern  . Not on file   Social History Narrative   Patient lives at home alone, has 2 children   Patient is right handed   Educational level is some college   Caffeine consumption is 3 cups a day     PHYSICAL EXAM  Vitals:   12/29/16 1455  BP: (!) 205/120  Pulse: 87  Resp: 16  Weight: 127 lb (57.6 kg)  Height: 5\' 5"  (1.651 m)    Body mass index is 21.13 kg/m.   General: The patient is well-developed and well-nourished and in no acute distress    Neurologic Exam  Mental status: The patient is alert and oriented x 3 at the time of the examination. The patient has apparent normal recent and remote memory, with an apparently normal attention span and concentration ability.   Speech is normal.  Cranial nerves: Extraocular movements are full.    There is mildly reduced right V2 facial sensation to soft touch.  Facial strength is reduced on the right, incompletely. Taste is reduced on right side of tongue.  Trapezius and sternocleidomastoid strength is normal. No dysarthria is noted.  The tongue is midline, and the patient has symmetric elevation of the soft palate. Hearing is decreased on the right but Weber does not lateralize.    Motor:   Muscle bulk and tone are normal. Strength is  5 / 5 in all 4 extremities.   Sensory: Sensory testing is intact to temperature and touch but vibration decreased in right foot.  Coordination: Cerebellar testing reveals good finger-nose-finger and heel-to-shin bilaterally.  Gait and station: Station is normal and gait has reduced stride and is wide.   She needs unilateral support.   She cannot tandem walk. Romberg is positive.   Reflexes: Deep tendon  reflexes are symmetric and normal bilaterally.     DIAGNOSTIC DATA (LABS, IMAGING, TESTING) - I reviewed patient records, labs, notes, testing and imaging myself where available.  Lab Results  Component Value Date   WBC 7.0 12/24/2016   HGB 13.8 12/24/2016   HCT 39.4 12/24/2016   MCV 93.4 12/24/2016   PLT 188 12/24/2016      Component Value Date/Time   NA 134 (L) 12/24/2016 1408   NA 137 04/24/2014 1314   K 3.8 12/24/2016 1408   CL 101 12/24/2016 1408   CO2 23 12/24/2016 1408   GLUCOSE 97 12/24/2016 1408   BUN 11 12/24/2016 1408   BUN 12 04/24/2014 1314   CREATININE 0.72 12/24/2016 1408   CREATININE 0.65 08/11/2016 1500   CALCIUM 9.1 12/24/2016 1408   PROT 7.0 12/24/2016 1408   PROT 7.1 01/23/2016 1440   ALBUMIN 3.6 12/24/2016 1408   ALBUMIN 4.1  01/23/2016 1440   AST 28 12/24/2016 1408   ALT 20 12/24/2016 1408   ALKPHOS 56 12/24/2016 1408   BILITOT 0.9 12/24/2016 1408   BILITOT 0.2 01/23/2016 1440   GFRNONAA >60 12/24/2016 1408   GFRNONAA 72 07/31/2015 1427   GFRAA >60 12/24/2016 1408   GFRAA 83 07/31/2015 1427    Lab Results  Component Value Date   TSH 5.23 (H) 08/07/2016       ASSESSMENT AND PLAN  MS (multiple sclerosis) (HCC)  Trigeminal neuralgia  Abnormality of gait  Right-sided Bell's palsy  Essential hypertension   1.  Facial weakness is most c/w Bell's palsy but since gait is also much worse, we need to make sure not related to MS.   Check MRI.   If changes, we need to change from Rebif to a more efficacious DMT.   Continue Rebif for now 2.   Continue medications for trigeminal neuralgia.    3.   Exercise and stay active. 4.   Add HCTZ (already on metoprolol and losartan)   Needs to see PCP for HTN 5.   Return to clinic 3-4 months or sooner based on MRI if there are new or worsening neurologic symptoms.   45 minute face to face with >50% counseling and coordinating care about MS and new symptoms  Richard A. Epimenio Foot, MD, PhD 12/29/2016, 3:19  PM Certified in Neurology, Clinical Neurophysiology, Sleep Medicine, Pain Medicine and Neuroimaging  [facility] [address], [address] [place], [place] [address] [phone]

## 2017-01-03 ENCOUNTER — Other Ambulatory Visit: Payer: Self-pay | Admitting: Neurology

## 2017-01-07 ENCOUNTER — Other Ambulatory Visit: Payer: Self-pay | Admitting: Neurology

## 2017-01-09 ENCOUNTER — Other Ambulatory Visit: Payer: Self-pay | Admitting: Family Medicine

## 2017-01-13 ENCOUNTER — Ambulatory Visit
Admission: RE | Admit: 2017-01-13 | Discharge: 2017-01-13 | Disposition: A | Discharge status: Home / Self Care | Payer: Medicare Other | Source: Ambulatory Visit | Attending: Neurology | Admitting: Neurology

## 2017-01-13 DIAGNOSIS — R269 Unspecified abnormalities of gait and mobility: Secondary | ICD-10-CM

## 2017-01-13 DIAGNOSIS — G35 Multiple sclerosis: Secondary | ICD-10-CM

## 2017-01-13 DIAGNOSIS — G510 Bell's palsy: Secondary | ICD-10-CM

## 2017-01-13 MED ORDER — GADOBENATE DIMEGLUMINE 529 MG/ML IV SOLN
10.0000 mL | Freq: Once | INTRAVENOUS | Status: AC | PRN
  Administered 2017-01-13: 10 mL via INTRAVENOUS

## 2017-01-15 ENCOUNTER — Emergency Department (HOSPITAL_COMMUNITY)
Admission: EM | Admit: 2017-01-15 | Discharge: 2017-01-16 | Disposition: A | Discharge status: Home / Self Care | Payer: Medicare Other | Attending: Emergency Medicine | Admitting: Emergency Medicine

## 2017-01-15 ENCOUNTER — Encounter (HOSPITAL_COMMUNITY): Payer: Self-pay | Admitting: Emergency Medicine

## 2017-01-15 DIAGNOSIS — S0990XA Unspecified injury of head, initial encounter: Secondary | ICD-10-CM | POA: Insufficient documentation

## 2017-01-15 DIAGNOSIS — Y9289 Other specified places as the place of occurrence of the external cause: Secondary | ICD-10-CM | POA: Insufficient documentation

## 2017-01-15 DIAGNOSIS — I10 Essential (primary) hypertension: Secondary | ICD-10-CM | POA: Insufficient documentation

## 2017-01-15 DIAGNOSIS — Z79899 Other long term (current) drug therapy: Secondary | ICD-10-CM | POA: Diagnosis not present

## 2017-01-15 DIAGNOSIS — Z7982 Long term (current) use of aspirin: Secondary | ICD-10-CM | POA: Insufficient documentation

## 2017-01-15 DIAGNOSIS — R29810 Facial weakness: Secondary | ICD-10-CM | POA: Insufficient documentation

## 2017-01-15 DIAGNOSIS — R5383 Other fatigue: Secondary | ICD-10-CM

## 2017-01-15 DIAGNOSIS — W19XXXA Unspecified fall, initial encounter: Secondary | ICD-10-CM

## 2017-01-15 DIAGNOSIS — G35 Multiple sclerosis: Secondary | ICD-10-CM | POA: Insufficient documentation

## 2017-01-15 DIAGNOSIS — F17210 Nicotine dependence, cigarettes, uncomplicated: Secondary | ICD-10-CM | POA: Insufficient documentation

## 2017-01-15 DIAGNOSIS — Y999 Unspecified external cause status: Secondary | ICD-10-CM | POA: Insufficient documentation

## 2017-01-15 DIAGNOSIS — W228XXA Striking against or struck by other objects, initial encounter: Secondary | ICD-10-CM | POA: Diagnosis not present

## 2017-01-15 DIAGNOSIS — Y9389 Activity, other specified: Secondary | ICD-10-CM | POA: Insufficient documentation

## 2017-01-15 DIAGNOSIS — E039 Hypothyroidism, unspecified: Secondary | ICD-10-CM | POA: Diagnosis not present

## 2017-01-15 DIAGNOSIS — E876 Hypokalemia: Secondary | ICD-10-CM

## 2017-01-15 DIAGNOSIS — R531 Weakness: Secondary | ICD-10-CM | POA: Diagnosis not present

## 2017-01-15 DIAGNOSIS — R404 Transient alteration of awareness: Secondary | ICD-10-CM | POA: Diagnosis not present

## 2017-01-15 NOTE — ED Notes (Signed)
ED provider at the bedside for initial assessment

## 2017-01-15 NOTE — ED Provider Notes (Addendum)
[facility]-EMERGENCY DEPT Provider Note   CSN: [id number] Arrival date & time: 01/15/17  2100     History   Chief Complaint Chief Complaint  Patient presents with  . Fatigue    HPI Melanie Richardson is a 66 y.o. female.  HPI Patient reports that she has multiple sclerosis, she states she has intermittent falls. She reports that she fell yesterday and hit her head. She states she wasn't knocked out. She reports she had a headache earlier but she no longer has one. She states that she is here because her caregiver was concerned that she was sleeping more and they're worried about her fall and possible head injury. She reports she really doesn't feel any different. She reports since she developed Bell's Palsy. She has been more unsteady and weak. Nonetheless she doesn't feel that it is significantly more so than what is typical of her baseline MS. Past Medical History:  Diagnosis Date  . Hypertension   . MS (multiple sclerosis) (HCC)   . Osteopenia 05/2014   T score -1.8 FRAX 16%/4.7%  . Thyroid disease    Hypothyroid  . Trigeminal neuralgia     Patient Active Problem List   Diagnosis Date Noted  . Right-sided Bell's palsy 12/29/2016  . Allergic rhinitis 08/07/2015  . Hypertension 08/06/2015  . Hypertriglyceridemia 08/06/2015  . Anxiety state 07/25/2015  . Dysesthesia 10/26/2014  . Abnormality of gait 10/26/2014  . Chronic fatigue 10/26/2014  . Sinusitis, acute 07/17/2013  . Wheezing 07/17/2013  . Hypothyroidism 07/17/2013  . MS (multiple sclerosis) (HCC)   . Trigeminal neuralgia     Past Surgical History:  Procedure Laterality Date  . BACK SURGERY  2011  . ELBOW SURGERY  1999   shattered elbow  . feet surgery  1990  . gamma knife  2005  . radiofrquency rhizotomy  [id number]  . underarm surgery  1988    OB History    Gravida Para Term Preterm AB Living   2 2       2    SAB TAB Ectopic Multiple Live Births                   Home Medications    Prior to  Admission medications   Medication Sig Start Date End Date Taking? Authorizing Provider  albuterol (PROVENTIL HFA;VENTOLIN HFA) 108 (90 BASE) MCG/ACT inhaler Inhale 2 puffs into the lungs every 6 (six) hours as needed for wheezing. 08/06/15   Salley Scarlet, MD  alendronate (FOSAMAX) 70 MG tablet TAKE 1 TABLET BY MOUTH EVERY 7 DAYS WITH FULL GLASS OF WATER ON AN EMPTY STOMACH 11/04/16   Dara Lords, MD  amitriptyline (ELAVIL) 50 MG tablet TAKE 1 TABLET BY MOUTH AT BEDTIME. 07/30/16   Asa Lente, MD  aspirin 81 MG tablet Take 81 mg by mouth daily.    Historical Provider, MD  carbamazepine (TEGRETOL XR) 400 MG 12 hr tablet TAKE 1 TABLET BY MOUTH 3 TIMES A DAY 01/05/17   Asa Lente, MD  Cholecalciferol (VITAMIN D PO) Take 400 Units by mouth daily.     Historical Provider, MD  cycloSPORINE (RESTASIS) 0.05 % ophthalmic emulsion Place 1 drop into both eyes 2 (two) times daily.    Historical Provider, MD  diazepam (VALIUM) 5 MG tablet TAKE 1 TABLET BY MOUTH EVERY 12 HOURS AS NEEDED 12/29/16   Asa Lente, MD  eletriptan (RELPAX) 40 MG tablet Take 1 tablet (40 mg total) by mouth every 2 (two) hours as  needed for migraine or headache. One tablet by mouth at onset of headache. May repeat in 2 hours if headache persists or recurs. 10/24/13   Ramond Marrow, DO  fluticasone (FLONASE) 50 MCG/ACT nasal spray PLACE 2 SPRAYS INTO THE NOSE DAILY. Patient taking differently: Place 2 sprays into both nostrils daily as needed for allergies.  08/06/15   Salley Scarlet, MD  hydrochlorothiazide (HYDRODIURIL) 25 MG tablet Take 1 tablet (25 mg total) by mouth every morning. 12/29/16   Asa Lente, MD  HYDROcodone-acetaminophen (NORCO) 7.5-325 MG tablet Take 1 tablet by mouth every 6 (six) hours as needed for moderate pain. 08/26/16   Asa Lente, MD  ibuprofen (ADVIL,MOTRIN) 200 MG tablet Take 200 mg by mouth every 6 (six) hours as needed for headache or moderate pain.     Historical Provider, MD    interferon beta-1a (REBIF) 44 MCG/0.5ML SOSY injection Inject 0.5 mLs (44 mcg total) into the skin 3 (three) times a week. 12/04/15   Asa Lente, MD  lamoTRIgine (LAMICTAL) 150 MG tablet TAKE 1 TABLET (150 MG TOTAL) BY MOUTH DAILY. 10/28/14   Asa Lente, MD  levothyroxine (SYNTHROID, LEVOTHROID) 50 MCG tablet TAKE 1 TABLET BY MOUTH EVERY DAY BEFORE BREAKFAST 11/10/16   Salley Scarlet, MD  [facility] EX) Apply 1 patch topically as needed (for pain).    Historical Provider, MD  loratadine (CLARITIN) 10 MG tablet Take 1 tablet (10 mg total) by mouth daily. Patient taking differently: Take 10 mg by mouth daily as needed for allergies.  08/11/16   Salley Scarlet, MD  losartan (COZAAR) 100 MG tablet TAKE 1 TABLET (100 MG TOTAL) BY MOUTH DAILY. 03/25/16   Salley Scarlet, MD  Menthol, Topical Analgesic, (BENGAY EX) Apply 1 application topically as needed (for pain).    Historical Provider, MD  metoprolol succinate (TOPROL-XL) 25 MG 24 hr tablet Take 3 tablets (75 mg total) by mouth daily. 12/24/16 01/07/17  Laurence Spates, MD  Multiple Vitamin (MULTIVITAMIN) tablet Take 1 tablet by mouth daily.    Historical Provider, MD  nicotine polacrilex (NICORETTE) 2 MG gum Take 2 mg by mouth as needed for smoking cessation.    Historical Provider, MD  predniSONE (DELTASONE) 20 MG tablet Take 2 tablets (40 mg total) by mouth daily. Take 40 mg by mouth daily for 3 days, then 20mg  by mouth daily for 3 days, then 10mg  daily for 3 days 12/24/16   Laurence Spates, MD  pregabalin (LYRICA) 75 MG capsule Take 1 capsule (75 mg total) by mouth 2 (two) times daily. 08/01/16   Levert Feinstein, MD  REBIF REBIDOSE 44 MCG/0.5ML SOAJ INJECT ONE PEN (44 MCG) SUBCUTANEOUSLY THREE TIMES PER WEEK. KEEP REFRIGERATED. Patient not taking: Reported on 12/24/2016 06/13/16   Asa Lente, MD  traZODone (DESYREL) 50 MG tablet Take 1 tablet (50 mg total) by mouth at bedtime. Patient not taking: Reported on 12/24/2016 01/09/16    Asa Lente, MD  valACYclovir (VALTREX) 1000 MG tablet Take 1 tablet (1,000 mg total) by mouth 3 (three) times daily. 12/24/16   Laurence Spates, MD    Family History Family History  Problem Relation Age of Onset  . Heart disease Mother   . Hypertension Father   . Cancer Father     colon  . Stroke Father   . Breast cancer Maternal Aunt 50  . Breast cancer Cousin 49    Social History Social History  Substance Use Topics  .  Smoking status: Current Every Day Smoker    Packs/day: 1.00    Types: Cigarettes  . Smokeless tobacco: Never Used  . Alcohol use 0.0 oz/week     Comment: rarely     Allergies   Ciprofloxacin; Codeine; and Erythromycin   Review of Systems Review of Systems  10 Systems reviewed and are negative for acute change except as noted in the HPI.  Physical Exam Updated Vital Signs BP (!) 143/87   Pulse 64   Temp 97.5 F (36.4 C)   Resp 19   Ht 5\' 5"  (1.651 m)   Wt 126 lb (57.2 kg)   LMP 10/06/1996   SpO2 97%   BMI 20.97 kg/m   Physical Exam  Constitutional: She is oriented to person, place, and time.  Patient is alert and nontoxic. Patient is thin but well-developed. No respiratory distress.  HENT:  Head: Normocephalic and atraumatic.  Right Ear: External ear normal.  Left Ear: External ear normal.  Nose: Nose normal.  Mouth/Throat: Oropharynx is clear and moist.  Eyes: EOM are normal. Pupils are equal, round, and reactive to light.  Neck: Neck supple.  Cardiovascular: Normal rate, regular rhythm, normal heart sounds and intact distal pulses.   Pulmonary/Chest: Effort normal and breath sounds normal.  Abdominal: Soft. She exhibits no distension. There is no tenderness. There is no guarding.  Musculoskeletal: Normal range of motion. She exhibits no edema or tenderness.  Neurological: She is alert and oriented to person, place, and time. A cranial nerve deficit is present. She exhibits normal muscle tone.  Patient has left facial droop.  She performs grip strength and push pull bilaterally and symmetrically upper extremities. Patient extends both lower extremities against resistance with good strength.  Skin: Skin is warm and dry.  Psychiatric: She has a normal mood and affect.     ED Treatments / Results  Labs (all labs ordered are listed, but only abnormal results are displayed) Labs Reviewed - No data to display  EKG  EKG Interpretation None       Radiology No results found.  Procedures Procedures (including critical care time)  Medications Ordered in ED Medications - No data to display   Initial Impression / Assessment and Plan / ED Course  I have reviewed the triage vital signs and the nursing notes.  Pertinent labs & imaging results that were available during my care of the patient were reviewed by me and considered in my medical decision making (see chart for details).     Final Clinical Impressions(s) / ED Diagnoses   Final diagnoses:  Fall, initial encounter  Multiple sclerosis (HCC)  Injury of head, initial encounter   Patient is alert and appropriate. She is well oriented. She does not take anticoagulants. At this time physical examination does not suggest intracranial hemorrhage or cerebral contusion. The patient has known MS and had an MRI done 4 days ago by her neurologist. There does not appear to be significant interval change from previous. Clinically patient does not feel that she's had much change. At this time feel she is stable to return to home with caregivers.  At time of discharge, the patient's caregiver was contacted for transportation per the patient's instructions. Caregiver then reported concerns about the patient not eating and not drinking and sleeping all the time. The caregiver wanted know why lab work was not done and why the patient was tired all the time. I explained that the patient reported she did not have these symptoms and felt  that she was at her baseline. Patient  reported that she was here based on prodding of her caregivers. Reviewed MRI from four days ago and labs done 3\20. There were no significant changes and clinically, the patient is alert and cognitively normal. She states she thinks she is eating enough and for some reason her providers did not think she should eat lunch today. Unfortunately, the caregiver, Geraldine Contras, was not present for the patient's evaluation in the emergency department to express her concerns. The patient does not seem to have any cognitive impairment, I have deemed her to be an appropriate historian and based evaluation and clinical judgment on her history and physical examination at the time of assessment. New Prescriptions New Prescriptions   No medications on file     Arby Barrette, MD 01/15/17 1610    Arby Barrette, MD 01/16/17 [phone]

## 2017-01-15 NOTE — ED Triage Notes (Signed)
Per EMS was called out to home due to the pt being more weaker than normal. Pt has Bells Palsy and has left sided facial drooping already. Caregiver told EMS that pt was more lethargic and gate not steady more than normal. Pt could not remember falling at all until she got to hospital. Not sure of LOC.

## 2017-01-16 ENCOUNTER — Emergency Department (HOSPITAL_COMMUNITY): Payer: Medicare Other

## 2017-01-16 DIAGNOSIS — R531 Weakness: Secondary | ICD-10-CM | POA: Diagnosis not present

## 2017-01-16 DIAGNOSIS — S0990XA Unspecified injury of head, initial encounter: Secondary | ICD-10-CM | POA: Diagnosis not present

## 2017-01-16 DIAGNOSIS — R29810 Facial weakness: Secondary | ICD-10-CM | POA: Diagnosis not present

## 2017-01-16 LAB — CBC WITH DIFFERENTIAL/PLATELET
Basophils Absolute: 0.1 10*3/uL (ref 0.0–0.1)
Basophils Relative: 1 %
Eosinophils Absolute: 0.1 10*3/uL (ref 0.0–0.7)
Eosinophils Relative: 2 %
HCT: 39.3 % (ref 36.0–46.0)
Hemoglobin: 14.2 g/dL (ref 12.0–15.0)
Lymphocytes Relative: 42 %
Lymphs Abs: 2.7 10*3/uL (ref 0.7–4.0)
MCH: 33.6 pg (ref 26.0–34.0)
MCHC: 36.1 g/dL — ABNORMAL HIGH (ref 30.0–36.0)
MCV: 93.1 fL (ref 78.0–100.0)
Monocytes Absolute: 0.7 10*3/uL (ref 0.1–1.0)
Monocytes Relative: 11 %
Neutro Abs: 2.9 10*3/uL (ref 1.7–7.7)
Neutrophils Relative %: 44 %
Platelets: 193 10*3/uL (ref 150–400)
RBC: 4.22 MIL/uL (ref 3.87–5.11)
RDW: 13.3 % (ref 11.5–15.5)
WBC: 6.5 10*3/uL (ref 4.0–10.5)

## 2017-01-16 LAB — I-STAT CHEM 8, ED
BUN: 17 mg/dL (ref 6–20)
Calcium, Ion: 1.12 mmol/L — ABNORMAL LOW (ref 1.15–1.40)
Chloride: 100 mmol/L — ABNORMAL LOW (ref 101–111)
Creatinine, Ser: 0.6 mg/dL (ref 0.44–1.00)
Glucose, Bld: 110 mg/dL — ABNORMAL HIGH (ref 65–99)
HCT: 41 % (ref 36.0–46.0)
Hemoglobin: 13.9 g/dL (ref 12.0–15.0)
Potassium: 3 mmol/L — ABNORMAL LOW (ref 3.5–5.1)
Sodium: 134 mmol/L — ABNORMAL LOW (ref 135–145)
TCO2: 27 mmol/L (ref 0–100)

## 2017-01-16 LAB — URINALYSIS, ROUTINE W REFLEX MICROSCOPIC
Bilirubin Urine: NEGATIVE
Glucose, UA: NEGATIVE mg/dL
Hgb urine dipstick: NEGATIVE
Ketones, ur: NEGATIVE mg/dL
Leukocytes, UA: NEGATIVE
Nitrite: NEGATIVE
Protein, ur: NEGATIVE mg/dL
Specific Gravity, Urine: 1.013 (ref 1.005–1.030)
pH: 7 (ref 5.0–8.0)

## 2017-01-16 MED ORDER — ONDANSETRON HCL 4 MG PO TABS: 4.0000 mg | ORAL_TABLET | Freq: Four times a day (QID) | ORAL | 0 refills | Status: DC

## 2017-01-16 MED ORDER — POTASSIUM CHLORIDE CRYS ER 20 MEQ PO TBCR
40.0000 meq | EXTENDED_RELEASE_TABLET | Freq: Once | ORAL | Status: AC
  Administered 2017-01-16: 40 meq via ORAL
  Filled 2017-01-16: qty 2

## 2017-01-16 MED ORDER — POTASSIUM CHLORIDE CRYS ER 20 MEQ PO TBCR: 20.0000 meq | EXTENDED_RELEASE_TABLET | Freq: Two times a day (BID) | ORAL | 0 refills | Status: DC

## 2017-01-16 MED ORDER — ACETAMINOPHEN 325 MG PO TABS
650.0000 mg | ORAL_TABLET | Freq: Once | ORAL | Status: AC
  Administered 2017-01-16: 650 mg via ORAL
  Filled 2017-01-16: qty 2

## 2017-01-16 MED ORDER — SODIUM CHLORIDE 0.9 % IV BOLUS (SEPSIS)
500.0000 mL | Freq: Once | INTRAVENOUS | Status: AC
  Administered 2017-01-16: 500 mL via INTRAVENOUS

## 2017-01-16 MED ORDER — KETOROLAC TROMETHAMINE 15 MG/ML IJ SOLN
7.5000 mg | Freq: Once | INTRAMUSCULAR | Status: AC
  Administered 2017-01-16: 7.5 mg via INTRAVENOUS
  Filled 2017-01-16: qty 1

## 2017-01-16 MED ORDER — ONDANSETRON HCL 4 MG/2ML IJ SOLN
4.0000 mg | Freq: Once | INTRAMUSCULAR | Status: AC
  Administered 2017-01-16: 4 mg via INTRAVENOUS
  Filled 2017-01-16: qty 2

## 2017-01-16 MED ORDER — SODIUM CHLORIDE 0.9 % IV SOLN
Freq: Once | INTRAVENOUS | Status: AC
  Administered 2017-01-16: 05:00:00 via INTRAVENOUS

## 2017-01-16 NOTE — ED Provider Notes (Signed)
This is a 66 year old female who was seen by previous incision at time of discharge she proceeded to have multiple episodes of projectile vomiting she was readmitted to the hospital where labs were drawn CT of the head was obtained due to recent fall urine was obtained patient was given IV fluid she was found to be hypokalemic this was supplemented with 40 mEq by mouth. After which she was reassessed by following team and discharged home stating that she felt better.   Earley Favor, NP 01/16/17 1955    Layla Maw Ward, DO 01/18/17 2359

## 2017-01-16 NOTE — ED Notes (Signed)
Patient transported to CT 

## 2017-01-16 NOTE — Discharge Instructions (Signed)
Lab work show mild decrease in the potassium. Was given oral potassium in the ED. Will send you home with 3 days of oral potassium. Please follow up with your primary doctor on Monday to have your potassium rechecked and to follow up concerning your visit today. Have given you zofran for nausea. Return to the ED if your symptoms worsen.

## 2017-01-16 NOTE — ED Provider Notes (Signed)
The patient received and care hand off by NP Shulz. Please see full history and physical from prior note. The patient was awaiting urine results. If urine was normal plan was to DC patient home with nausea medications. Patient was in x-ray and x-ray correlated, the patient did not want the cervical x-ray. States she had no cervical pain. She did have a head CT that was normal. Patient was reevaluated she has no midline tenderness. No paraspinal tenderness. Patient with full range of motion without any pain. Since patient refused x-ray did not feel that is medicated this time. Potassium was 3.0. Was orally replaced. We'll send patient home with 3 days of oral potassium and follow with primary care doctor for repeat potassium. Patient said she had a mild headache and takes Motrin for this. Given small dose of Toradol. Patient feels much improved. She is able tolerate by mouth fluids without any difficulties. She continues to do any neuro deficits. We'll discharge home with Zofran and potassium and follow up with primary care doctor. Have given strict return cautions. Patient verbalized understanding the plan of care. I'll question were answered prior to discharge.   Rise Mu, PA-C 01/16/17 0650    Layla Maw Ward, DO 01/16/17 [phone]

## 2017-01-16 NOTE — ED Notes (Signed)
Patient transported to X-ray 

## 2017-01-16 NOTE — ED Notes (Signed)
Pt resting in bed attempted to get pt. Out of bed when she suddenly felt dizzy pt placed back in bed will wait for ride

## 2017-01-16 NOTE — ED Notes (Addendum)
Pt given Diet Coke for PO challenge. Pt tolerating well. Will continue to monitor.

## 2017-01-20 ENCOUNTER — Telehealth: Payer: Self-pay

## 2017-01-20 ENCOUNTER — Telehealth: Payer: Self-pay | Admitting: *Deleted

## 2017-01-20 DIAGNOSIS — B370 Candidal stomatitis: Secondary | ICD-10-CM | POA: Diagnosis not present

## 2017-01-20 DIAGNOSIS — N390 Urinary tract infection, site not specified: Secondary | ICD-10-CM | POA: Diagnosis not present

## 2017-01-20 MED ORDER — POTASSIUM CHLORIDE CRYS ER 20 MEQ PO TBCR: 20.0000 meq | EXTENDED_RELEASE_TABLET | Freq: Two times a day (BID) | ORAL | 0 refills | Status: DC

## 2017-01-20 NOTE — Telephone Encounter (Signed)
Maranda from [facility] called and stated patient was seen in the hospital 4/12 she  has Bell's palsy as well as decreased potassium and her blood pressure is running 184/110.  This B/P reading is the only b/p reading provided at this time. Patient appointment is being changed to 4-19 @12 :15. I will send in enough potassium pills to hold the patient until her appointment.   When I asked if the patient had been taking her b/p medications as prescribed Hinda Kehr stated she was

## 2017-01-20 NOTE — Telephone Encounter (Signed)
-----   Message from Richard A Sater, MD sent at 01/16/2017  1:54 PM EDT ----- Please note that the MRI of the brain during a brand-new lesions but there were several new lesions compared to the 2016 MRI.    Therefore, we need to consider a different treatment. I can have her come in Monday afternoon to discuss other options. 

## 2017-01-20 NOTE — Telephone Encounter (Signed)
LMTC./fim 

## 2017-01-21 ENCOUNTER — Other Ambulatory Visit: Payer: Self-pay | Admitting: Neurology

## 2017-01-22 ENCOUNTER — Encounter: Payer: Self-pay | Admitting: Physician Assistant

## 2017-01-22 ENCOUNTER — Ambulatory Visit (INDEPENDENT_AMBULATORY_CARE_PROVIDER_SITE_OTHER): Payer: Medicare Other | Admitting: Physician Assistant

## 2017-01-22 VITALS — BP 140/78 | HR 86 | Temp 98.0°F | Resp 16 | Wt 123.6 lb

## 2017-01-22 DIAGNOSIS — G35 Multiple sclerosis: Secondary | ICD-10-CM

## 2017-01-22 DIAGNOSIS — I10 Essential (primary) hypertension: Secondary | ICD-10-CM

## 2017-01-22 DIAGNOSIS — Z09 Encounter for follow-up examination after completed treatment for conditions other than malignant neoplasm: Secondary | ICD-10-CM | POA: Diagnosis not present

## 2017-01-22 DIAGNOSIS — E876 Hypokalemia: Secondary | ICD-10-CM

## 2017-01-22 NOTE — Progress Notes (Signed)
Patient ID: Melanie Richardson MRN: [id number], DOB: 08-08-1951, 66 y.o. Date of Encounter: @DATE @  Chief Complaint:  Chief Complaint  Patient presents with  . bells palsy  . Hypertension    HPI: 66 y.o. year old female  presents for f/u after recent ED visit.   Today I have reviewed her emergency room note dated 01/15/17.  The following is copied from that hospital note: "This is a 66 year old female who was seen by previous incision at time of discharge she proceeded to have multiple episodes of projectile vomiting she was readmitted to the hospital where labs were drawn CT of the head was obtained due to recent fall urine was obtained patient was given IV fluid she was found to be hypokalemic this was supplemented with 40 mEq by mouth. After which she was reassessed by following team and discharged home stating that she felt better.  ------ ----The patient received and care hand off by NP Shulz. Please see full history and physical from prior note. The patient was awaiting urine results. If urine was normal plan was to DC patient home with nausea medications. Patient was in x-ray and x-ray correlated, the patient did not want the cervical x-ray. States she had no cervical pain. She did have a head CT that was normal. Patient was reevaluated she has no midline tenderness. No paraspinal tenderness. Patient with full range of motion without any pain. Since patient refused x-ray did not feel that is medicated this time. Potassium was 3.0. Was orally replaced. We'll send patient home with 3 days of oral potassium and follow with primary care doctor for repeat potassium. Patient said she had a mild headache and takes Motrin for this. Given small dose of Toradol. Patient feels much improved. She is able tolerate by mouth fluids without any difficulties. She continues to do any neuro deficits. We'll discharge home with Zofran and potassium and follow up with primary care doctor. Have given strict return  cautions. Patient verbalized understanding the plan of care. I'll question were answered prior to discharge. --------  Patient reports that she has multiple sclerosis, she states she has intermittent falls. She reports that she fell yesterday and hit her head. She states she wasn't knocked out. She reports she had a headache earlier but she no longer has one. She states that she is here because her caregiver was concerned that she was sleeping more and they're worried about her fall and possible head injury. She reports she really doesn't feel any different. She reports since she developed Bell's Palsy. She has been more unsteady and weak. Nonetheless she doesn't feel that it is significantly more so than what is typical of her baseline MS. ----------------- -- Final diagnoses:  Fall, initial encounter  Multiple sclerosis (HCC)  Injury of head, initial encounter   Patient is alert and appropriate. She is well oriented. She does not take anticoagulants. At this time physical examination does not suggest intracranial hemorrhage or cerebral contusion. The patient has known MS and had an MRI done 4 days ago by her neurologist. There does not appear to be significant interval change from previous. Clinically patient does not feel that she's had much change. At this time feel she is stable to return to home with caregivers.  At time of discharge, the patient's caregiver was contacted for transportation per the patient's instructions. Caregiver then reported concerns about the patient not eating and not drinking and sleeping all the time. The caregiver wanted know why lab work  was not done and why the patient was tired all the time. I explained that the patient reported she did not have these symptoms and felt that she was at her baseline. Patient reported that she was here based on prodding of her caregivers. Reviewed MRI from four days ago and labs done 3\20. There were no significant changes and  clinically, the patient is alert and cognitively normal. She states she thinks she is eating enough and for some reason her providers did not think she should eat lunch today. Unfortunately, the caregiver, Geraldine Contras, was not present for the patient's evaluation in the emergency department to express her concerns. The patient does not seem to have any cognitive impairment, I have deemed her to be an appropriate historian and based evaluation and clinical judgment on her history and physical examination at the time of assessment."   TODAY I HAVE ALSO REVIEWED PHONE NOTES SINCE THAT E.D. VISIT:  01/20/2017----is a Phone Notes to our office reporting that blood pressure was running high. She was scheduled appointment for today and also prescribed enough potassium pills to last until appointment today. 01/20/2017--- is also a message from Dr. Epimenio Foot----" Lee's note that the MRI of the brain during a brand-new lesions but there were several new lesions compared to the 2016 MRI. Therefore we need to consider a different treatment. I can have her come in Monday afternoon to discuss other options."  There is then a phone message from their nurse documenting "LMTCB"   ----------------------------TODAY--------------------------------------------------------------------------- Today patient states that she has not heard anything in follow-up from her MRI report. She knows nothing about a follow-up appointment at neurology. Told her that there is a note documented from Dr. Berna Spare and that his nurse has tried calling patient and had left her a message to call them back. Told her to call [name] to follow up-----patient is here with her caregiver today. I've told both of them to call [name]. Reviewed that her blood pressure is reading good at today's visit. She agrees. Has had no high readings since phone message to our office 01/20/2017. She has no specific concerns to address today.    Past Medical  History:  Diagnosis Date  . Hypertension   . MS (multiple sclerosis) (HCC)   . Osteopenia 05/2014   T score -1.8 FRAX 16%/4.7%  . Thyroid disease    Hypothyroid  . Trigeminal neuralgia      Home Meds: Outpatient Medications Prior to Visit  Medication Sig Dispense Refill  . albuterol (PROVENTIL HFA;VENTOLIN HFA) 108 (90 BASE) MCG/ACT inhaler Inhale 2 puffs into the lungs every 6 (six) hours as needed for wheezing. 1 Inhaler 6  . alendronate (FOSAMAX) 70 MG tablet TAKE 1 TABLET BY MOUTH EVERY 7 DAYS WITH FULL GLASS OF WATER ON AN EMPTY STOMACH 4 tablet 12  . amitriptyline (ELAVIL) 50 MG tablet TAKE 1 TABLET BY MOUTH AT BEDTIME. 90 tablet 1  . aspirin 81 MG tablet Take 81 mg by mouth daily.    . carbamazepine (TEGRETOL XR) 400 MG 12 hr tablet TAKE 1 TABLET BY MOUTH 3 TIMES A DAY 90 tablet 11  . Cholecalciferol (VITAMIN D PO) Take 400 Units by mouth daily.     . cycloSPORINE (RESTASIS) 0.05 % ophthalmic emulsion Place 1 drop into both eyes 2 (two) times daily.    . diazepam (VALIUM) 5 MG tablet TAKE 1 TABLET BY MOUTH EVERY 12 HOURS AS NEEDED 60 tablet 5  . eletriptan (RELPAX) 40 MG tablet Take 1  tablet (40 mg total) by mouth every 2 (two) hours as needed for migraine or headache. One tablet by mouth at onset of headache. May repeat in 2 hours if headache persists or recurs. 12 tablet 0  . fluticasone (FLONASE) 50 MCG/ACT nasal spray PLACE 2 SPRAYS INTO THE NOSE DAILY. (Patient taking differently: Place 2 sprays into both nostrils daily as needed for allergies. ) 16 g 6  . hydrochlorothiazide (HYDRODIURIL) 25 MG tablet Take 1 tablet (25 mg total) by mouth every morning. 30 tablet 5  . HYDROcodone-acetaminophen (NORCO) 7.5-325 MG tablet Take 1 tablet by mouth every 6 (six) hours as needed for moderate pain. 120 tablet 0  . ibuprofen (ADVIL,MOTRIN) 200 MG tablet Take 200 mg by mouth every 6 (six) hours as needed for headache or moderate pain.     Marland Kitchen interferon beta-1a (REBIF) 44 MCG/0.5ML SOSY  injection Inject 0.5 mLs (44 mcg total) into the skin 3 (three) times a week. 36 Syringe 3  . lamoTRIgine (LAMICTAL) 150 MG tablet TAKE 1 TABLET (150 MG TOTAL) BY MOUTH DAILY. 90 tablet 3  . levothyroxine (SYNTHROID, LEVOTHROID) 50 MCG tablet TAKE 1 TABLET BY MOUTH EVERY DAY BEFORE BREAKFAST 30 tablet 6  . Liniments (SALONPAS EX) Apply 1 patch topically as needed (for pain).    Marland Kitchen loratadine (CLARITIN) 10 MG tablet Take 1 tablet (10 mg total) by mouth daily. (Patient taking differently: Take 10 mg by mouth daily as needed for allergies. ) 30 tablet 11  . losartan (COZAAR) 100 MG tablet TAKE 1 TABLET (100 MG TOTAL) BY MOUTH DAILY. 30 tablet 6  . Menthol, Topical Analgesic, (BENGAY EX) Apply 1 application topically as needed (for pain).    . Multiple Vitamin (MULTIVITAMIN) tablet Take 1 tablet by mouth daily.    . nicotine polacrilex (NICORETTE) 2 MG gum Take 2 mg by mouth as needed for smoking cessation.    . ondansetron (ZOFRAN) 4 MG tablet Take 1 tablet (4 mg total) by mouth every 6 (six) hours. 10 tablet 0  . potassium chloride SA (K-DUR,KLOR-CON) 20 MEQ tablet Take 1 tablet (20 mEq total) by mouth 2 (two) times daily. 6 tablet 0  . predniSONE (DELTASONE) 20 MG tablet Take 2 tablets (40 mg total) by mouth daily. Take 40 mg by mouth daily for 3 days, then 20mg  by mouth daily for 3 days, then 10mg  daily for 3 days 12 tablet 0  . pregabalin (LYRICA) 75 MG capsule Take 1 capsule (75 mg total) by mouth 2 (two) times daily. 60 capsule 5  . REBIF REBIDOSE 44 MCG/0.5ML SOAJ INJECT ONE PEN (44 MCG) SUBCUTANEOUSLY THREE TIMES PER WEEK. KEEP REFRIGERATED. 36 Syringe 3  . valACYclovir (VALTREX) 1000 MG tablet Take 1 tablet (1,000 mg total) by mouth 3 (three) times daily. 21 tablet 0  . metoprolol succinate (TOPROL-XL) 25 MG 24 hr tablet Take 3 tablets (75 mg total) by mouth daily. 42 tablet 0  . traZODone (DESYREL) 50 MG tablet Take 1 tablet (50 mg total) by mouth at bedtime. (Patient not taking: Reported on  12/24/2016) 30 tablet 5   No facility-administered medications prior to visit.     Allergies:  Allergies  Allergen Reactions  . Ciprofloxacin Other (See Comments)    Dizziness and headache  . Codeine Other (See Comments)    GI upset  . Erythromycin Other (See Comments)    Unknown    Social History   Social History  . Marital status: Married    Spouse name: N/A  .  Number of children: 2  . Years of education: college   Occupational History  .  Other   Social History Main Topics  . Smoking status: Current Every Day Smoker    Packs/day: 1.00    Types: Cigarettes  . Smokeless tobacco: Never Used  . Alcohol use 0.0 oz/week     Comment: rarely  . Drug use: No  . Sexual activity: Yes    Birth control/ protection: Post-menopausal     Comment: 1st intercourse 66 yo-Fewer than 5 partners   Other Topics Concern  . Not on file   Social History Narrative   Patient lives at home alone, has 2 children   Patient is right handed   Educational level is some college   Caffeine consumption is 3 cups a day    Family History  Problem Relation Age of Onset  . Heart disease Mother   . Hypertension Father   . Cancer Father     colon  . Stroke Father   . Breast cancer Maternal Aunt 50  . Breast cancer Cousin 40     Review of Systems:  See HPI for pertinent ROS. All other ROS negative.    Physical Exam: Blood pressure 140/78, pulse 86, temperature 98 F (36.7 C), temperature source Oral, resp. rate 16, weight 123 lb 9.6 oz (56.1 kg), last menstrual period 10/06/1996, SpO2 98 %., Body mass index is 20.57 kg/m. General: WNWD WF. Appears in no acute distress. Neck: Supple. No thyromegaly. No lymphadenopathy. Lungs: Clear bilaterally to auscultation without wheezes, rales, or rhonchi. Breathing is unlabored. Heart: RRR with S1 S2. No murmurs, rubs, or gallops. Abdomen: Soft, non-tender, non-distended with normoactive bowel sounds. No hepatomegaly. No rebound/guarding. No obvious  abdominal masses. Musculoskeletal:  Strength and tone normal for age. Extremities/Skin: Warm and dry.  No edema.  Neuro: Alert and oriented X 3. Moves all extremities spontaneously.  Psych:  Responds to questions appropriately with a normal affect.     ASSESSMENT AND PLAN:  66 y.o. year old female with   1. Hypokalemia Will recheck Potassium---was given supplement at ED - BASIC METABOLIC PANEL WITH GFR  2. Hospital discharge follow-up  3. Essential hypertension BP is good today. Cont current meds.  4. MS (multiple sclerosis) (HCC) She is informed to call [name] to return call from Dr. Berna Spare. She voices understanding, agrees.    [address] [place], [place], [facility] 01/22/2017 12:52 PM

## 2017-01-23 ENCOUNTER — Telehealth: Payer: Self-pay

## 2017-01-23 LAB — BASIC METABOLIC PANEL WITH GFR
BUN: 14 mg/dL (ref 7–25)
CHLORIDE: 106 mmol/L (ref 98–110)
CO2: 23 mmol/L (ref 20–31)
CREATININE: 0.8 mg/dL (ref 0.50–0.99)
Calcium: 9.4 mg/dL (ref 8.6–10.4)
GFR, Est African American: 89 mL/min (ref >=60)
GFR, Est Non African American: 77 mL/min (ref >=60)
Glucose, Bld: 73 mg/dL (ref 70–99)
Potassium: 3.9 mmol/L (ref 3.5–5.3)
Sodium: 138 mmol/L (ref 135–146)

## 2017-01-23 NOTE — Telephone Encounter (Signed)
Called patient to go over her lab results

## 2017-01-26 ENCOUNTER — Telehealth: Payer: Self-pay | Admitting: *Deleted

## 2017-01-26 ENCOUNTER — Telehealth: Payer: Self-pay | Admitting: Neurology

## 2017-01-26 ENCOUNTER — Other Ambulatory Visit: Payer: Self-pay | Admitting: Neurology

## 2017-01-26 MED ORDER — PREDNISONE 10 MG PO TABS: ORAL_TABLET | ORAL | 3 refills | Status: DC

## 2017-01-26 NOTE — Telephone Encounter (Signed)
I have spoken with Melanie Richardson this morning and per RAS, reviewed MRI results as below.  She verbalized understanding of same. Appt. given to discuss other tx. options/fim

## 2017-01-26 NOTE — Telephone Encounter (Signed)
-----   Message from Asa Lente, MD sent at 01/16/2017  1:54 PM EDT ----- Please note that the MRI of the brain during a brand-new lesions but there were several new lesions compared to the 2016 MRI.    Therefore, we need to consider a different treatment. I can have her come in Monday afternoon to discuss other options.

## 2017-01-26 NOTE — Telephone Encounter (Signed)
Prednisone escribed to [facility] per pt's request/fim

## 2017-01-26 NOTE — Telephone Encounter (Signed)
I have spoken with Melanie Richardson, and per RAS, reviewed MRI result as below.  Appt. given next week to discuss results/fim

## 2017-01-26 NOTE — Addendum Note (Signed)
Addended by: Candis Schatz I on: 01/26/2017 09:48 AM   Modules accepted: Orders

## 2017-01-26 NOTE — Telephone Encounter (Signed)
Patient called office requesting refill for predniSONE (DELTASONE) 20 MG tablet.  [facility] [address]

## 2017-01-28 ENCOUNTER — Other Ambulatory Visit: Payer: Self-pay

## 2017-01-28 ENCOUNTER — Telehealth: Payer: Self-pay | Admitting: Family Medicine

## 2017-01-28 NOTE — Telephone Encounter (Signed)
Spoke with patient and provided lab results.

## 2017-01-28 NOTE — Telephone Encounter (Signed)
Forwarded to PA assistant.   MBD was last to see patient and did her labs.

## 2017-01-28 NOTE — Telephone Encounter (Signed)
Patient is calling to talk to you regarding her blood work and potassium levels [phone]

## 2017-01-29 ENCOUNTER — Inpatient Hospital Stay: Payer: Medicare Other | Admitting: Physician Assistant

## 2017-01-30 ENCOUNTER — Other Ambulatory Visit: Payer: Self-pay

## 2017-02-03 ENCOUNTER — Other Ambulatory Visit: Payer: Self-pay

## 2017-02-03 NOTE — Telephone Encounter (Signed)
Pharmacy sent over a fax for a refill on klor-con M20 tablet this medication has been refused because provider has discontinued the medication

## 2017-02-04 ENCOUNTER — Encounter: Payer: Self-pay | Admitting: Neurology

## 2017-02-04 ENCOUNTER — Encounter (INDEPENDENT_AMBULATORY_CARE_PROVIDER_SITE_OTHER): Payer: Self-pay

## 2017-02-04 ENCOUNTER — Ambulatory Visit (INDEPENDENT_AMBULATORY_CARE_PROVIDER_SITE_OTHER): Payer: Medicare Other | Admitting: Neurology

## 2017-02-04 ENCOUNTER — Encounter: Payer: Self-pay | Admitting: *Deleted

## 2017-02-04 VITALS — BP 153/102 | HR 101 | Resp 18 | Ht 65.0 in | Wt 120.5 lb

## 2017-02-04 DIAGNOSIS — Z79899 Other long term (current) drug therapy: Secondary | ICD-10-CM | POA: Diagnosis not present

## 2017-02-04 DIAGNOSIS — R269 Unspecified abnormalities of gait and mobility: Secondary | ICD-10-CM

## 2017-02-04 DIAGNOSIS — G500 Trigeminal neuralgia: Secondary | ICD-10-CM

## 2017-02-04 DIAGNOSIS — G510 Bell's palsy: Secondary | ICD-10-CM

## 2017-02-04 DIAGNOSIS — G35 Multiple sclerosis: Secondary | ICD-10-CM | POA: Diagnosis not present

## 2017-02-04 NOTE — Progress Notes (Signed)
[facility]  PATIENT: Melanie Richardson DOB: [birth year]  REFERRING CLINICIAN: Kingsley Spittle [place] HISTORY FROM: Patient MS   REASON FOR VISIT: MS and trigeminal Neuralgia   HISTORICAL  CHIEF COMPLAINT:  Chief Complaint  Patient presents with  . Multiple Sclerosis    New lesions on MRI despite compliance with Rebif.  Here to discuss other tx. options/fim    HISTORY OF PRESENT ILLNESS:  Melanie Richardson is a 66 year old woman with multiple sclerosis.  MS:    Her MRI a couple weeks ago showed a few lesions not present in 2016 implying continued inflammation .   We discussed considering a different medication   Vision:   She notes some blurry vision on the left. She also feels the left eye is dry (facial weakness and reduced blinks are on the right)  About 7 weeks ago, she had what she feels was a sinus infection and felt off balanced.   She fell in a parking lot, hitting her head but did not lose consciousness.   About 2 weeks ago, her son noted the right face was drooping and she went to [facility] ED last week.  She did not note headache at the onset.   She noted mild lip numbness but the weakness was the main symptom   She notes water dribbling out of her mouth when she drank.    She noted mild change in vision last week but back to baseline.   .    No change in leg or arm numbness or weakness.  No change in gait or bladder.     In the ED, she was given Valtrex and prednisone.  She had a CT of the head.   It shows White matter changes c/w her MS and a scalp hematoma on her left.    MS:   She is on Rebif and tolerates it well.    Before current symptoms, she had no recent exacerbation.  Trigeminal neuralgia:    TN pain has done fairly well lately . She has had only occasional spasms of pain on the right side of her face..   She has received the most benefit from combination of the antiepileptics (Tegretol, Pregabalin and Lamotrigine) and Amitriptyline.   She is not needing as much  hydrocodone for pain. Stress often triggers a spell.    In the past, her spells were so frequent and severe that activity like eating would trigger more severe pain.   In the past, she has had a gamma knife procedure (2005) and radiofrequency ablations 2. These procedures did not help her much at the time. Those procedures left her with reduced sensation on the right.  Gait/strength/sensation:   She feels off balanced and had the one fall.  Gait is worse the past 1/2 year in general, especially the past 1-2 months. .    She now must use a cane.  She has no major weakness, but her right leg seems weaker when she is hot.   She has no numbness in arms or legs.    Bladder:   She denies any bladder issues.      Vision/hearing:   She feels vision is about the same but notes some blurry vision last week, .   She feels the right hearing is not as good as the left.      Fatigue/sleep:   Fatigue is mild most days.  She continues to have some insomnia, worse with sleep maintenance. Ambien and amitriptyline help her insomnia.  Mood/cognition:   She notes depression and mild anxiety and has had more stress the past year.     LBP/leg pain:  She has low back pain and right leg radiculopathy pain at times. This is helped a lot by Lyrica. She notes she limps some if temperatures are warmer.    MS History:   She had the onset of right sided facial numbness now pregnant during the 1970s. That numbness resolved over the next month or so. However, in the mid 1990s she began to experience pain in the same distribution. She had an MRI of the brain performed by Dr. Buzzy Han in 1998. It was consistent with multiple sclerosis.  She had an LP in the 1970's and the CSF was not significantly abnormal to diagnose MS.   She has been on Rebif since 2004.  She had an exacerbation of March 2018 with worsening gait, left visual changes. She also had facial weakness that could be a Bell's palsy  REVIEW OF SYSTEMS:  Constitutional: No  fevers, chills, sweats, or change in appetite.   Some fatigue and insomnia Eyes: No visual changes, double vision, eye pain Ear, nose and throat: No hearing loss, ear pain, nasal congestion, sore throat Cardiovascular: No chest pain, palpitations Respiratory:  No shortness of breath at rest or with exertion.   No wheezes GastrointestinaI: No nausea, vomiting, diarrhea, abdominal pain, fecal incontinence Genitourinary:  No dysuria, urinary retention or frequency.  No nocturia. Musculoskeletal:  No neck pain, back pain Integumentary: No rash, pruritus, skin lesions Neurological: as above Psychiatric: No depression at this time.  No anxiety Endocrine: No palpitations, diaphoresis, change in appetite, change in weigh or increased thirst Hematologic/Lymphatic:  No anemia, purpura, petechiae. Allergic/Immunologic: No itchy/runny eyes, nasal congestion, recent allergic reactions, rashes  ALLERGIES: Allergies  Allergen Reactions  . Ciprofloxacin Other (See Comments)    Dizziness and headache  . Codeine Other (See Comments)    GI upset  . Erythromycin Other (See Comments)    Unknown    HOME MEDICATIONS: Outpatient Medications Prior to Visit  Medication Sig Dispense Refill  . albuterol (PROVENTIL HFA;VENTOLIN HFA) 108 (90 BASE) MCG/ACT inhaler Inhale 2 puffs into the lungs every 6 (six) hours as needed for wheezing. 1 Inhaler 6  . alendronate (FOSAMAX) 70 MG tablet TAKE 1 TABLET BY MOUTH EVERY 7 DAYS WITH FULL GLASS OF WATER ON AN EMPTY STOMACH 4 tablet 12  . amitriptyline (ELAVIL) 50 MG tablet TAKE 1 TABLET BY MOUTH AT BEDTIME. 90 tablet 1  . aspirin 81 MG tablet Take 81 mg by mouth daily.    . carbamazepine (TEGRETOL XR) 400 MG 12 hr tablet TAKE 1 TABLET BY MOUTH 3 TIMES A DAY 90 tablet 11  . Cholecalciferol (VITAMIN D PO) Take 400 Units by mouth daily.     . cycloSPORINE (RESTASIS) 0.05 % ophthalmic emulsion Place 1 drop into both eyes 2 (two) times daily.    . diazepam (VALIUM) 5 MG  tablet TAKE 1 TABLET BY MOUTH EVERY 12 HOURS AS NEEDED 60 tablet 5  . eletriptan (RELPAX) 40 MG tablet Take 1 tablet (40 mg total) by mouth every 2 (two) hours as needed for migraine or headache. One tablet by mouth at onset of headache. May repeat in 2 hours if headache persists or recurs. 12 tablet 0  . fluticasone (FLONASE) 50 MCG/ACT nasal spray PLACE 2 SPRAYS INTO THE NOSE DAILY. (Patient taking differently: Place 2 sprays into both nostrils daily as needed for allergies. ) 16 g 6  .  hydrochlorothiazide (HYDRODIURIL) 25 MG tablet Take 1 tablet (25 mg total) by mouth every morning. 30 tablet 5  . HYDROcodone-acetaminophen (NORCO) 7.5-325 MG tablet Take 1 tablet by mouth every 6 (six) hours as needed for moderate pain. 120 tablet 0  . ibuprofen (ADVIL,MOTRIN) 200 MG tablet Take 200 mg by mouth every 6 (six) hours as needed for headache or moderate pain.     Marland Kitchen interferon beta-1a (REBIF) 44 MCG/0.5ML SOSY injection Inject 0.5 mLs (44 mcg total) into the skin 3 (three) times a week. 36 Syringe 3  . ipratropium (ATROVENT) 0.06 % nasal spray Place 0.06 sprays into the nose daily as needed.    . lamoTRIgine (LAMICTAL) 150 MG tablet TAKE 1 TABLET (150 MG TOTAL) BY MOUTH DAILY. 90 tablet 3  . levothyroxine (SYNTHROID, LEVOTHROID) 50 MCG tablet TAKE 1 TABLET BY MOUTH EVERY DAY BEFORE BREAKFAST 30 tablet 6  . Liniments (SALONPAS EX) Apply 1 patch topically as needed (for pain).    Marland Kitchen loratadine (CLARITIN) 10 MG tablet Take 1 tablet (10 mg total) by mouth daily. (Patient taking differently: Take 10 mg by mouth daily as needed for allergies. ) 30 tablet 11  . losartan (COZAAR) 100 MG tablet TAKE 1 TABLET (100 MG TOTAL) BY MOUTH DAILY. 30 tablet 6  . LYRICA 75 MG capsule TAKE ONE CAPSULE BY MOUTH TWICE A DAY 60 capsule 5  . Menthol, Topical Analgesic, (BENGAY EX) Apply 1 application topically as needed (for pain).    . Multiple Vitamin (MULTIVITAMIN) tablet Take 1 tablet by mouth daily.    . nicotine polacrilex  (NICORETTE) 2 MG gum Take 2 mg by mouth as needed for smoking cessation.    . ondansetron (ZOFRAN) 4 MG tablet Take 1 tablet (4 mg total) by mouth every 6 (six) hours. 10 tablet 0  . predniSONE (DELTASONE) 10 MG tablet Take 1/2 to 1 tablet by mouth 3 times daily 90 tablet 3  . REBIF REBIDOSE 44 MCG/0.5ML SOAJ INJECT ONE PEN (44 MCG) SUBCUTANEOUSLY THREE TIMES PER WEEK. KEEP REFRIGERATED. 36 Syringe 3  . valACYclovir (VALTREX) 1000 MG tablet Take 1 tablet (1,000 mg total) by mouth 3 (three) times daily. 21 tablet 0  . metoprolol succinate (TOPROL-XL) 25 MG 24 hr tablet Take 3 tablets (75 mg total) by mouth daily. 42 tablet 0   No facility-administered medications prior to visit.     PAST MEDICAL HISTORY: Past Medical History:  Diagnosis Date  . Hypertension   . MS (multiple sclerosis) (HCC)   . Osteopenia 05/2014   T score -1.8 FRAX 16%/4.7%  . Thyroid disease    Hypothyroid  . Trigeminal neuralgia     PAST SURGICAL HISTORY: Past Surgical History:  Procedure Laterality Date  . BACK SURGERY  2011  . ELBOW SURGERY  1999   shattered elbow  . feet surgery  1990  . gamma knife  2005  . radiofrquency rhizotomy  [id number]  . underarm surgery  1988    FAMILY HISTORY: Family History  Problem Relation Age of Onset  . Heart disease Mother   . Hypertension Father   . Cancer Father     colon  . Stroke Father   . Breast cancer Maternal Aunt 50  . Breast cancer Cousin 16    SOCIAL HISTORY:  Social History   Social History  . Marital status: Married    Spouse name: N/A  . Number of children: 2  . Years of education: college   Occupational History  .  Other  Social History Main Topics  . Smoking status: Current Every Day Smoker    Packs/day: 1.00    Types: Cigarettes  . Smokeless tobacco: Never Used  . Alcohol use 0.0 oz/week     Comment: rarely  . Drug use: No  . Sexual activity: Yes    Birth control/ protection: Post-menopausal     Comment: 1st intercourse 66  yo-Fewer than 5 partners   Other Topics Concern  . Not on file   Social History Narrative   Patient lives at home alone, has 2 children   Patient is right handed   Educational level is some college   Caffeine consumption is 3 cups a day     PHYSICAL EXAM  Vitals:   02/04/17 1300  BP: (!) 153/102  Pulse: (!) 101  Resp: 18  Weight: 120 lb 8 oz (54.7 kg)  Height: 5\' 5"  (1.651 m)    Body mass index is 20.05 kg/m.   General: The patient is well-developed and well-nourished and in no acute distress    Neurologic Exam  Mental status: The patient is alert and oriented x 3 at the time of the examination. The patient has apparent normal recent and remote memory, with an apparently normal attention span and concentration ability.   Speech is normal.  Cranial nerves: Extraocular movements are full.    There is mildly reduced right V2 facial sensation to soft touch.  Facial strength is reduced on the right, but better than last visit.    Trapezius and sternocleidomastoid strength is normal. No dysarthria is noted.  The tongue is midline, and the patient has symmetric elevation of the soft palate. Hearing is decreased on the right but Weber does not lateralize.    Motor:   Muscle bulk and tone are normal. Strength is  5 / 5 in arms and 4+/5 in legs.   .   Sensory: Sensory testing is intact to temperature and touch but vibration decreased in right foot.  Coordination: Cerebellar testing reveals good finger-nose-finger and heel-to-shin bilaterally.  Gait and station: Station is normal and gait has reduced stride and is wide.   She needs unilateral support due to balance more than strength.   She cannot tandem walk. Romberg is positive.   Reflexes: Deep tendon reflexes are symmetric and normal bilaterally.     DIAGNOSTIC DATA (LABS, IMAGING, TESTING) - I reviewed patient records, labs, notes, testing and imaging myself where available.  Lab Results  Component Value Date   WBC 6.5  01/16/2017   HGB 13.9 01/16/2017   HCT 41.0 01/16/2017   MCV 93.1 01/16/2017   PLT 193 01/16/2017      Component Value Date/Time   NA 138 01/22/2017 1249   NA 137 04/24/2014 1314   K 3.9 01/22/2017 1249   CL 106 01/22/2017 1249   CO2 23 01/22/2017 1249   GLUCOSE 73 01/22/2017 1249   BUN 14 01/22/2017 1249   BUN 12 04/24/2014 1314   CREATININE 0.80 01/22/2017 1249   CALCIUM 9.4 01/22/2017 1249   PROT 7.0 12/24/2016 1408   PROT 7.1 01/23/2016 1440   ALBUMIN 3.6 12/24/2016 1408   ALBUMIN 4.1 01/23/2016 1440   AST 28 12/24/2016 1408   ALT 20 12/24/2016 1408   ALKPHOS 56 12/24/2016 1408   BILITOT 0.9 12/24/2016 1408   BILITOT 0.2 01/23/2016 1440   GFRNONAA 77 01/22/2017 1249   GFRAA 89 01/22/2017 1249    Lab Results  Component Value Date   TSH 5.23 (H) 08/07/2016  ASSESSMENT AND PLAN  MS (multiple sclerosis) (HCC) - Plan: Hepatitis B surface antigen, Hepatitis B core antibody, total, Quantiferon tb gold assay (blood), Hepatitis B surface antibody, Hepatic function panel, CBC with Differential/Platelet  Trigeminal neuralgia  Abnormality of gait  Right-sided Bell's palsy  High risk medication use - Plan: Hepatitis B surface antigen, Hepatitis B core antibody, total, Quantiferon tb gold assay (blood), Hepatitis B surface antibody, Hepatic function panel, CBC with Differential/Platelet   1.  We discussed Tecfidera, Gilenya, Tysabri and Ocrevus.    She would prefer an oral agent and would prefer Tecfidera as its safety data is better.   She would also consider the IV medications and I will check labs so we can determine if she is a candidate if she has trouble with Tecfidera s.e. 2.   Continue medications for trigeminal neuralgia.    3.   Exercise and stay active. 4.   Return to clinic 3-4 months or sooner based on MRI if there are new or worsening neurologic symptoms.    Ajiah Mcglinn A. Epimenio Foot, MD, PhD 02/04/2017, 1:36 PM Certified in Neurology, Clinical  Neurophysiology, Sleep Medicine, Pain Medicine and Neuroimaging  [facility] [address], [address] [place], [place] [address] [phone]

## 2017-02-05 ENCOUNTER — Telehealth: Payer: Self-pay | Admitting: *Deleted

## 2017-02-05 LAB — HEPATIC FUNCTION PANEL
ALT: 21 IU/L (ref 0–32)
AST: 24 IU/L (ref 0–40)
Albumin: 4.1 g/dL (ref 3.6–4.8)
Alkaline Phosphatase: 69 IU/L (ref 39–117)
BILIRUBIN TOTAL: 0.3 mg/dL (ref 0.0–1.2)
BILIRUBIN, DIRECT: 0.12 mg/dL (ref 0.00–0.40)
Total Protein: 7.3 g/dL (ref 6.0–8.5)

## 2017-02-05 LAB — CBC WITH DIFFERENTIAL/PLATELET
BASOS ABS: 0 10*3/uL (ref 0.0–0.2)
Basos: 0 %
EOS (ABSOLUTE): 0.2 10*3/uL (ref 0.0–0.4)
EOS: 2 %
HEMATOCRIT: 41 % (ref 34.0–46.6)
HEMOGLOBIN: 14 g/dL (ref 11.1–15.9)
Immature Grans (Abs): 0 10*3/uL (ref 0.0–0.1)
Immature Granulocytes: 0 %
LYMPHS ABS: 2.2 10*3/uL (ref 0.7–3.1)
Lymphs: 24 %
MCH: 32.6 pg (ref 26.6–33.0)
MCHC: 34.1 g/dL (ref 31.5–35.7)
MCV: 96 fL (ref 79–97)
MONOCYTES: 11 %
Monocytes Absolute: 1 10*3/uL — ABNORMAL HIGH (ref 0.1–0.9)
Neutrophils Absolute: 5.9 10*3/uL (ref 1.4–7.0)
Neutrophils: 63 %
Platelets: 273 10*3/uL (ref 150–379)
RBC: 4.29 x10E6/uL (ref 3.77–5.28)
RDW: 14.1 % (ref 12.3–15.4)
WBC: 9.4 10*3/uL (ref 3.4–10.8)

## 2017-02-05 LAB — HEPATITIS B SURFACE ANTIGEN: Hepatitis B Surface Ag: NEGATIVE

## 2017-02-05 LAB — HEPATITIS B SURFACE ANTIBODY,QUALITATIVE: HEP B SURFACE AB, QUAL: NONREACTIVE

## 2017-02-05 LAB — HEPATITIS B CORE ANTIBODY, TOTAL: Hep B Core Total Ab: NEGATIVE

## 2017-02-05 NOTE — Telephone Encounter (Signed)
error/fim. 

## 2017-02-07 ENCOUNTER — Other Ambulatory Visit: Payer: Self-pay | Admitting: Neurology

## 2017-02-07 DIAGNOSIS — R7612 Nonspecific reaction to cell mediated immunity measurement of gamma interferon antigen response without active tuberculosis: Secondary | ICD-10-CM

## 2017-02-07 LAB — QUANTIFERON IN TUBE
QFT TB AG MINUS NIL VALUE: 0.03 [IU]/mL
QUANTIFERON MITOGEN VALUE: 0.5 [IU]/mL
QUANTIFERON NIL VALUE: 0.04 [IU]/mL
QUANTIFERON TB AG VALUE: 0.07 IU/mL
QUANTIFERON TB GOLD: UNDETERMINED — AB

## 2017-02-07 LAB — QUANTIFERON TB GOLD ASSAY (BLOOD)

## 2017-02-09 ENCOUNTER — Telehealth: Payer: Self-pay | Admitting: *Deleted

## 2017-02-09 NOTE — Telephone Encounter (Signed)
-----   Message from Asa Lente, MD sent at 02/07/2017  2:00 PM EDT ----- Her hepatitis labs were fine. The TB test was indeterminate. This is usually okay but we should check a chest x-ray (PA and lateral) just to make sure that there is no evidence of lung infection.  (I put the order in)  If we have not already done so, we can set in the Tecfidera form.

## 2017-02-09 NOTE — Telephone Encounter (Signed)
I have spoken with Melanie [place] this morning and per RAS, explained that hepatitis labs are ok.  TB test was indeterminate.  To be sure there is no evidence of TB infection in her lungs, she needs a CXR.  Can do this without an appt. at [facility].  She veralized understanding of same, sts. she will go for X-ray asap--prob. next week/fim

## 2017-02-10 ENCOUNTER — Ambulatory Visit
Admission: RE | Admit: 2017-02-10 | Discharge: 2017-02-10 | Disposition: A | Discharge status: Home / Self Care | Payer: Medicare Other | Source: Ambulatory Visit | Attending: Neurology | Admitting: Neurology

## 2017-02-10 DIAGNOSIS — I700 Atherosclerosis of aorta: Secondary | ICD-10-CM | POA: Diagnosis not present

## 2017-02-10 DIAGNOSIS — R7612 Nonspecific reaction to cell mediated immunity measurement of gamma interferon antigen response without active tuberculosis: Secondary | ICD-10-CM

## 2017-02-11 ENCOUNTER — Telehealth: Payer: Self-pay | Admitting: *Deleted

## 2017-02-11 NOTE — Telephone Encounter (Signed)
I have spoken with Melanie [place] this afternoon, and per RAS, advised CXR shows no signs of TB.  She verbalized understanding of same.  Tecfidera srf has been sent to [facility]/fim

## 2017-02-11 NOTE — Telephone Encounter (Signed)
-----   Message from Asa Lente, MD sent at 02/11/2017  1:12 PM EDT ----- Labs are okay. If not already done so, we can send in the Tecfidera form.   Please let her know the chest x-ray was fine that there was no evidence of TB

## 2017-02-16 ENCOUNTER — Other Ambulatory Visit: Payer: Self-pay | Admitting: Family Medicine

## 2017-02-16 NOTE — Telephone Encounter (Signed)
Per MD notes on 08/11/2016, patient was supposed to increase Metoprolol to 50mg . No prescription for 50mg  ever sent.   Per PA notes on 01/22/2017, patient continues Metoprolol 25mg  and BP WNL.   Prescriptions sent to pharmacy.

## 2017-02-17 ENCOUNTER — Telehealth: Payer: Self-pay | Admitting: Neurology

## 2017-02-17 NOTE — Telephone Encounter (Signed)
Melanie [place]/[facility] [phone] x [phone] called request PA for tecfidera. Said she faxed the form last week to [phone], I gave her fax number of [phone] to refax. PA dept phone # [phone]

## 2017-02-18 ENCOUNTER — Telehealth: Payer: Self-pay | Admitting: *Deleted

## 2017-02-18 NOTE — Telephone Encounter (Signed)
error/fim. 

## 2017-02-18 NOTE — Telephone Encounter (Signed)
Tecfidera PA completed by phone with [facility].  She has tried and failed Rebif (since 2004--relapsed).  Approved for dates 10/04/16 thru 10/05/17. Ref# [id number]/fim

## 2017-02-20 ENCOUNTER — Telehealth: Payer: Self-pay | Admitting: Family Medicine

## 2017-02-20 MED ORDER — LEVOTHYROXINE SODIUM 50 MCG PO TABS: ORAL_TABLET | ORAL | 6 refills | Status: DC

## 2017-02-20 NOTE — Telephone Encounter (Signed)
Pt not clear as to why she has an appointment scheduled for 5-22 and 1 for June also she was told [name] would not want to see her until Sept. Pt is requesting a call back

## 2017-02-20 NOTE — Telephone Encounter (Signed)
Prescription sent to pharmacy.

## 2017-02-20 NOTE — Telephone Encounter (Signed)
Needs refill on synthroid

## 2017-02-20 NOTE — Telephone Encounter (Signed)
I have spoken with Melanie [place] this morning.  I believe the May and June appt's were old appts and I have cancelled them.  She verbalized understanding of same/fim

## 2017-02-24 ENCOUNTER — Ambulatory Visit: Payer: Medicare Other | Admitting: Neurology

## 2017-02-25 DIAGNOSIS — H2513 Age-related nuclear cataract, bilateral: Secondary | ICD-10-CM | POA: Diagnosis not present

## 2017-02-25 DIAGNOSIS — G35 Multiple sclerosis: Secondary | ICD-10-CM | POA: Diagnosis not present

## 2017-02-25 DIAGNOSIS — H04123 Dry eye syndrome of bilateral lacrimal glands: Secondary | ICD-10-CM | POA: Diagnosis not present

## 2017-03-11 ENCOUNTER — Telehealth: Payer: Self-pay | Admitting: Neurology

## 2017-03-11 MED ORDER — HYDROCODONE-ACETAMINOPHEN 7.5-325 MG PO TABS
1.0000 | ORAL_TABLET | Freq: Four times a day (QID) | ORAL | 0 refills | Status: DC | PRN
Start: 2017-03-11 — End: 2017-08-04

## 2017-03-11 NOTE — Telephone Encounter (Signed)
Patient called office requesting refill request for HYDROcodone-acetaminophen (NORCO) 7.5-325 MG tablet.

## 2017-03-11 NOTE — Telephone Encounter (Signed)
Rx. awaiting RAS sig/fim 

## 2017-03-11 NOTE — Telephone Encounter (Signed)
Rx. up front [facility]/fim 

## 2017-03-11 NOTE — Addendum Note (Signed)
Addended by: Candis Schatz I on: 03/11/2017 11:11 AM   Modules accepted: Orders

## 2017-03-20 DIAGNOSIS — J209 Acute bronchitis, unspecified: Secondary | ICD-10-CM | POA: Diagnosis not present

## 2017-03-31 ENCOUNTER — Ambulatory Visit: Payer: Medicare Other | Admitting: Neurology

## 2017-04-03 ENCOUNTER — Telehealth: Payer: Self-pay | Admitting: Neurology

## 2017-04-03 NOTE — Telephone Encounter (Signed)
Patient called office to verify if Dr. Epimenio Foot would like her to continue taking Tecfidera because she has called in the medication for a refill, but would like to make sure she is to continue medication.  Please call

## 2017-04-03 NOTE — Telephone Encounter (Signed)
I have spoken with Melanie Richardson this morning.  She reports she has finished her first month of Tecfidera and is tolerating it well.  I have encouraged her to continue it, and she is agreeable/fim

## 2017-05-13 DIAGNOSIS — H2513 Age-related nuclear cataract, bilateral: Secondary | ICD-10-CM | POA: Diagnosis not present

## 2017-05-13 DIAGNOSIS — G510 Bell's palsy: Secondary | ICD-10-CM | POA: Diagnosis not present

## 2017-06-09 ENCOUNTER — Ambulatory Visit: Payer: Medicare Other | Admitting: Neurology

## 2017-06-10 ENCOUNTER — Encounter: Payer: Self-pay | Admitting: Neurology

## 2017-06-19 ENCOUNTER — Other Ambulatory Visit: Payer: Self-pay | Admitting: Neurology

## 2017-06-25 ENCOUNTER — Other Ambulatory Visit: Payer: Self-pay | Admitting: Family Medicine

## 2017-06-25 NOTE — Telephone Encounter (Signed)
Medication filled x1 with no refills.   Requires office visit before any further refills can be given.   Letter sent.  

## 2017-07-13 ENCOUNTER — Other Ambulatory Visit: Payer: Self-pay | Admitting: Neurology

## 2017-07-17 ENCOUNTER — Ambulatory Visit: Payer: Medicare Other | Admitting: Neurology

## 2017-07-23 ENCOUNTER — Telehealth: Payer: Self-pay | Admitting: *Deleted

## 2017-07-23 ENCOUNTER — Other Ambulatory Visit: Payer: Self-pay | Admitting: Family Medicine

## 2017-07-23 ENCOUNTER — Ambulatory Visit: Payer: Self-pay | Admitting: Neurology

## 2017-07-23 NOTE — Telephone Encounter (Signed)
LMTC.  She has an appt. with RAS at 1330.  He has had to be of the office unexpectedly.  I need to r/s this appt. for her./fim

## 2017-07-25 ENCOUNTER — Other Ambulatory Visit: Payer: Self-pay | Admitting: Neurology

## 2017-07-26 ENCOUNTER — Other Ambulatory Visit: Payer: Self-pay | Admitting: Neurology

## 2017-07-27 ENCOUNTER — Telehealth: Payer: Self-pay | Admitting: Neurology

## 2017-07-27 NOTE — Telephone Encounter (Signed)
Valium rx. faxed to pharmacy this morning, and should have one r/ f of Lyrica left at pharmacy/fim

## 2017-07-27 NOTE — Telephone Encounter (Signed)
Pt isnasking RN Faith be on the look out for 2 prescriptions from  [id number] [place], [place] - [address] [phone] (Phone) [phone] (Fax)   For LYRICA 75 MG capsule and diazepam (VALIUM) 5 MG tablet Pt only asking for a call if there are problems with the refill request

## 2017-08-04 ENCOUNTER — Encounter (INDEPENDENT_AMBULATORY_CARE_PROVIDER_SITE_OTHER): Payer: Self-pay

## 2017-08-04 ENCOUNTER — Encounter: Payer: Self-pay | Admitting: Neurology

## 2017-08-04 ENCOUNTER — Ambulatory Visit (INDEPENDENT_AMBULATORY_CARE_PROVIDER_SITE_OTHER): Payer: Medicare Other | Admitting: Neurology

## 2017-08-04 VITALS — BP 165/95 | HR 102 | Resp 18 | Ht 65.0 in | Wt 108.0 lb

## 2017-08-04 DIAGNOSIS — Z79899 Other long term (current) drug therapy: Secondary | ICD-10-CM | POA: Diagnosis not present

## 2017-08-04 DIAGNOSIS — G35 Multiple sclerosis: Secondary | ICD-10-CM

## 2017-08-04 DIAGNOSIS — G500 Trigeminal neuralgia: Secondary | ICD-10-CM

## 2017-08-04 DIAGNOSIS — R5382 Chronic fatigue, unspecified: Secondary | ICD-10-CM | POA: Diagnosis not present

## 2017-08-04 DIAGNOSIS — R269 Unspecified abnormalities of gait and mobility: Secondary | ICD-10-CM

## 2017-08-04 MED ORDER — MELOXICAM 7.5 MG PO TABS: 7.5000 mg | ORAL_TABLET | Freq: Every day | ORAL | 5 refills | Status: DC | PRN

## 2017-08-04 MED ORDER — HYDROCODONE-ACETAMINOPHEN 7.5-325 MG PO TABS: 1.0000 | ORAL_TABLET | Freq: Four times a day (QID) | ORAL | 0 refills | Status: DC | PRN

## 2017-08-04 NOTE — Progress Notes (Signed)
[facility]  PATIENT: Melanie Richardson DOB: December 11, 1950  REFERRING CLINICIAN: Kingsley Spittle Crayne HISTORY FROM: Patient MS   REASON FOR VISIT: MS and trigeminal Neuralgia   HISTORICAL  CHIEF COMPLAINT:  Chief Complaint  Patient presents with  . Multiple Sclerosis    Sts she is tolerating Tecfidera well.  Ambulatory with a walker today. Denies recent falls./fim    HISTORY OF PRESENT ILLNESS:  Melanie Richardson is a 66 year old woman with multiple sclerosis.  Update 08/04/2017:   She switched to Tecfidera May 2018 due to new activity on the MRI and an exacerbation earlier this year. She is tolerating it well.   She denies any new exacerbations.   She has not had any GI issues or flushing.     She notes no new numbness, weakness or gait issues.   No recent falls.   Balance is poor  (old) and she uses a walker.   Her trigeminal neuralgia has not acted up at all this year.   Her vision worsened when she had Bell's palsy and she feels they are doing better again.   Bladder function is doing well.   No significant urgency.     Fatigue has been better since the last visit.      She sleeps well at night.   She feels mood is doing ok and she denies much depression or anxiety.    She notes no significant problems with cognition.  Allergies are bothering her more and she has a lot of nasal drainage.   She gets lower back pain helped by lidocaine patches (OTC).    Sometimes she also has proximal leg pain.  _______________________________________ From 02/04/2017 MS:    Her MRI a couple weeks ago showed a few lesions not present in 2016 implying continued inflammation .   We discussed considering a different medication   Vision:   She notes some blurry vision on the left. She also feels the left eye is dry (facial weakness and reduced blinks are on the right)  About 7 weeks ago, she had what she feels was a sinus infection and felt off balanced.   She fell in a parking lot, hitting her  head but did not lose consciousness.   About 2 weeks ago, her son noted the right face was drooping and she went to [facility] ED last week.  She did not note headache at the onset.   She noted mild lip numbness but the weakness was the main symptom   She notes water dribbling out of her mouth when she drank.    She noted mild change in vision last week but back to baseline.   .    No change in leg or arm numbness or weakness.  No change in gait or bladder.     In the ED, she was given Valtrex and prednisone.  She had a CT of the head.   It shows White matter changes c/w her MS and a scalp hematoma on her left.    MS:   She is on Rebif and tolerates it well.    Before current symptoms, she had no recent exacerbation.  Trigeminal neuralgia:    TN pain has done fairly well lately . She has had only occasional spasms of pain on the right side of her face..   She has received the most benefit from combination of the antiepileptics (Tegretol, Pregabalin and Lamotrigine) and Amitriptyline.   She is not needing as much hydrocodone for pain.  Stress often triggers a spell.    In the past, her spells were so frequent and severe that activity like eating would trigger more severe pain.   In the past, she has had a gamma knife procedure (2005) and radiofrequency ablations 2. These procedures did not help her much at the time. Those procedures left her with reduced sensation on the right.  Gait/strength/sensation:   She feels off balanced and had the one fall.  Gait is worse the past 1/2 year in general, especially the past 1-2 months. .    She now must use a cane.  She has no major weakness, but her right leg seems weaker when she is hot.   She has no numbness in arms or legs.    Bladder:   She denies any bladder issues.      Vision/hearing:   She feels vision is about the same but notes some blurry vision last week, .   She feels the right hearing is not as good as the left.      Fatigue/sleep:   Fatigue is mild most  days.  She continues to have some insomnia, worse with sleep maintenance. Ambien and amitriptyline help her insomnia.   Mood/cognition:   She notes depression and mild anxiety and has had more stress the past year.     LBP/leg pain:  She has low back pain and right leg radiculopathy pain at times. This is helped a lot by Lyrica. She notes she limps some if temperatures are warmer.    MS History:   She had the onset of right sided facial numbness now pregnant during the 1970s. That numbness resolved over the next month or so. However, in the mid 1990s she began to experience pain in the same distribution. She had an MRI of the brain performed by Dr. Buzzy HanStiefel in 1998. It was consistent with multiple sclerosis.  She had an LP in the 1970's and the CSF was not significantly abnormal to diagnose MS.   She has been on Rebif since 2004.  She had an exacerbation of March 2018 with worsening gait, left visual changes. She also had facial weakness that could be a Bell's palsy.   She switch to Tecfidera May 2018 due to the exacerbation and new activity on the MRI.  REVIEW OF SYSTEMS:  Constitutional: No fevers, chills, sweats, or change in appetite.   Some fatigue and insomnia Eyes: No visual changes, double vision, eye pain Ear, nose and throat: No hearing loss, ear pain, nasal congestion, sore throat Cardiovascular: No chest pain, palpitations Respiratory:  No shortness of breath at rest or with exertion.   No wheezes GastrointestinaI: No nausea, vomiting, diarrhea, abdominal pain, fecal incontinence Genitourinary:  No dysuria, urinary retention or frequency.  No nocturia. Musculoskeletal:  No neck pain, back pain Integumentary: No rash, pruritus, skin lesions Neurological: as above Psychiatric: No depression at this time.  No anxiety Endocrine: No palpitations, diaphoresis, change in appetite, change in weigh or increased thirst Hematologic/Lymphatic:  No anemia, purpura,  petechiae. Allergic/Immunologic: No itchy/runny eyes, nasal congestion, recent allergic reactions, rashes  ALLERGIES: Allergies  Allergen Reactions  . Ciprofloxacin Other (See Comments)    Dizziness and headache  . Codeine Other (See Comments)    GI upset  . Erythromycin Other (See Comments)    Unknown    HOME MEDICATIONS: Outpatient Medications Prior to Visit  Medication Sig Dispense Refill  . albuterol (PROVENTIL HFA;VENTOLIN HFA) 108 (90 BASE) MCG/ACT inhaler Inhale 2 puffs into the  lungs every 6 (six) hours as needed for wheezing. 1 Inhaler 6  . amitriptyline (ELAVIL) 50 MG tablet TAKE 1 TABLET BY MOUTH AT BEDTIME. 90 tablet 1  . aspirin 81 MG tablet Take 81 mg by mouth daily.    . carbamazepine (TEGRETOL XR) 400 MG 12 hr tablet TAKE 1 TABLET BY MOUTH 3 TIMES A DAY 90 tablet 11  . Cholecalciferol (VITAMIN D PO) Take 400 Units by mouth daily.     . cycloSPORINE (RESTASIS) 0.05 % ophthalmic emulsion Place 1 drop into both eyes 2 (two) times daily.    . diazepam (VALIUM) 5 MG tablet TAKE 1 TABLET BY MOUTH EVERY 12 HOURS AS NEEDED 60 tablet 5  . Dimethyl Fumarate 240 MG CPDR Take 240 mg by mouth 2 (two) times daily.    . fluticasone (FLONASE) 50 MCG/ACT nasal spray PLACE 2 SPRAYS INTO THE NOSE DAILY. (Patient taking differently: Place 2 sprays into both nostrils daily as needed for allergies. ) 16 g 6  . hydrochlorothiazide (HYDRODIURIL) 25 MG tablet TAKE 1 TABLET (25 MG TOTAL) BY MOUTH EVERY MORNING. 30 tablet 5  . ibuprofen (ADVIL,MOTRIN) 200 MG tablet Take 200 mg by mouth every 6 (six) hours as needed for headache or moderate pain.     Marland Kitchen ipratropium (ATROVENT) 0.06 % nasal spray Place 0.06 sprays into the nose daily as needed.    . lamoTRIgine (LAMICTAL) 150 MG tablet TAKE 1 TABLET (150 MG TOTAL) BY MOUTH DAILY. 90 tablet 3  . levothyroxine (SYNTHROID, LEVOTHROID) 50 MCG tablet TAKE 1 TABLET BY MOUTH EVERY DAY BEFORE BREAKFAST 30 tablet 6  . Liniments (SALONPAS EX) Apply 1 patch  topically as needed (for pain).    Marland Kitchen losartan (COZAAR) 100 MG tablet TAKE 1 TABLET BY MOUTH EVERY DAY 30 tablet 0  . LYRICA 75 MG capsule TAKE 1 CAPSULE BY MOUTH TWICE A DAY 60 capsule 5  . Multiple Vitamin (MULTIVITAMIN) tablet Take 1 tablet by mouth daily.    . predniSONE (DELTASONE) 10 MG tablet Take 1/2 to 1 tablet by mouth 3 times daily 90 tablet 3  . HYDROcodone-acetaminophen (NORCO) 7.5-325 MG tablet Take 1 tablet by mouth every 6 (six) hours as needed for moderate pain. 120 tablet 0  . alendronate (FOSAMAX) 70 MG tablet TAKE 1 TABLET BY MOUTH EVERY 7 DAYS WITH FULL GLASS OF WATER ON AN EMPTY STOMACH 4 tablet 12  . metoprolol succinate (TOPROL-XL) 25 MG 24 hr tablet Take 3 tablets (75 mg total) by mouth daily. 42 tablet 0  . Dimethyl Fumarate 120 & 240 MG MISC Take by mouth.    . eletriptan (RELPAX) 40 MG tablet Take 1 tablet (40 mg total) by mouth every 2 (two) hours as needed for migraine or headache. One tablet by mouth at onset of headache. May repeat in 2 hours if headache persists or recurs. 12 tablet 0  . interferon beta-1a (REBIF) 44 MCG/0.5ML SOSY injection Inject 0.5 mLs (44 mcg total) into the skin 3 (three) times a week. 36 Syringe 3  . loratadine (CLARITIN) 10 MG tablet Take 1 tablet (10 mg total) by mouth daily. (Patient not taking: Reported on 08/04/2017) 30 tablet 11  . losartan (COZAAR) 100 MG tablet TAKE 1 TABLET (100 MG TOTAL) BY MOUTH DAILY. 30 tablet 6  . Menthol, Topical Analgesic, (BENGAY EX) Apply 1 application topically as needed (for pain).    . metoprolol succinate (TOPROL-XL) 25 MG 24 hr tablet TAKE 1 TABLET (25 MG TOTAL) BY MOUTH DAILY. 30 tablet  6  . nicotine polacrilex (NICORETTE) 2 MG gum Take 2 mg by mouth as needed for smoking cessation.    . ondansetron (ZOFRAN) 4 MG tablet Take 1 tablet (4 mg total) by mouth every 6 (six) hours. (Patient not taking: Reported on 08/04/2017) 10 tablet 0  . REBIF REBIDOSE 44 MCG/0.5ML SOAJ INJECT ONE PEN (44 MCG)  SUBCUTANEOUSLY THREE TIMES PER WEEK. KEEP REFRIGERATED. 36 Syringe 3  . valACYclovir (VALTREX) 1000 MG tablet Take 1 tablet (1,000 mg total) by mouth 3 (three) times daily. 21 tablet 0   No facility-administered medications prior to visit.     PAST MEDICAL HISTORY: Past Medical History:  Diagnosis Date  . Hypertension   . MS (multiple sclerosis) (HCC)   . Osteopenia 05/2014   T score -1.8 FRAX 16%/4.7%  . Thyroid disease    Hypothyroid  . Trigeminal neuralgia     PAST SURGICAL HISTORY: Past Surgical History:  Procedure Laterality Date  . BACK SURGERY  2011  . ELBOW SURGERY  1999   shattered elbow  . feet surgery  1990  . gamma knife  2005  . radiofrquency rhizotomy  [id number]  . underarm surgery  1988    FAMILY HISTORY: Family History  Problem Relation Age of Onset  . Heart disease Mother   . Hypertension Father   . Cancer Father        colon  . Stroke Father   . Breast cancer Maternal Aunt 50  . Breast cancer Cousin 22    SOCIAL HISTORY:  Social History   Social History  . Marital status: Married    Spouse name: N/A  . Number of children: 2  . Years of education: college   Occupational History  .  Other   Social History Main Topics  . Smoking status: Current Every Day Smoker    Packs/day: 1.00    Types: Cigarettes  . Smokeless tobacco: Never Used  . Alcohol use 0.0 oz/week     Comment: rarely  . Drug use: No  . Sexual activity: Yes    Birth control/ protection: Post-menopausal     Comment: 1st intercourse 66 yo-Fewer than 5 partners   Other Topics Concern  . Not on file   Social History Narrative   Patient lives at home alone, has 2 children   Patient is right handed   Educational level is some college   Caffeine consumption is 3 cups a day     PHYSICAL EXAM  Vitals:   08/04/17 1321  BP: (!) 165/95  Pulse: (!) 102  Resp: 18  Weight: 108 lb (49 kg)  Height: 5\' 5"  (1.651 m)    Body mass index is 17.97 kg/m.   General: The  patient is well-developed and well-nourished and in no acute distress    Neurologic Exam  Mental status: The patient is alert and oriented x 3 at the time of the examination. The patient has apparent normal recent and remote memory, with an apparently normal attention span and concentration ability.   Speech is normal.  Cranial nerves: Extraocular movements are full.    Facial strength is normal but there is slightly reduced sensation to touch and temperature in the V2 distribution on the right. There is no dysarthria. Trapezius strength is normal. The tongue is midline, and the patient has symmetric elevation of the soft palate. Hearing is decreased on the right but Weber does not lateralize.    Motor:   Muscle bulk and tone are normal. Strength is  5 / 5 in arms and 4+/5 in legs.   .   Sensory: Sensory testing is intact to temperature and touch but vibration decreased in right foot.  Coordination: Cerebellar testing shows good finger-nose-finger slightly reduced heel-to-shin..  Gait and station: Station is normal and gait has reduced stride and is wide.   Can take some steps without walker but her balance is poor and she walks much better with a walker.   She cannot tandem walk.   Romberg is positive.   Reflexes: Deep tendon reflexes are symmetric and normal bilaterally.     DIAGNOSTIC DATA (LABS, IMAGING, TESTING) - I reviewed patient records, labs, notes, testing and imaging myself where available.  Lab Results  Component Value Date   WBC 9.4 02/04/2017   HGB 14.0 02/04/2017   HCT 41.0 02/04/2017   MCV 96 02/04/2017   PLT 273 02/04/2017      Component Value Date/Time   NA 138 01/22/2017 1249   NA 137 04/24/2014 1314   K 3.9 01/22/2017 1249   CL 106 01/22/2017 1249   CO2 23 01/22/2017 1249   GLUCOSE 73 01/22/2017 1249   BUN 14 01/22/2017 1249   BUN 12 04/24/2014 1314   CREATININE 0.80 01/22/2017 1249   CALCIUM 9.4 01/22/2017 1249   PROT 7.3 02/04/2017 1356   ALBUMIN 4.1  02/04/2017 1356   AST 24 02/04/2017 1356   ALT 21 02/04/2017 1356   ALKPHOS 69 02/04/2017 1356   BILITOT 0.3 02/04/2017 1356   GFRNONAA 77 01/22/2017 1249   GFRAA 89 01/22/2017 1249    Lab Results  Component Value Date   TSH 5.23 (H) 08/07/2016       ASSESSMENT AND PLAN  MS (multiple sclerosis) (HCC) - Plan: CBC with Differential/Platelet, Comprehensive metabolic panel  Trigeminal neuralgia  Abnormality of gait  Chronic fatigue  High risk medication use - Plan: CBC with Differential/Platelet, Comprehensive metabolic panel   1.  She will continue Tecfidera. We will check a CBC and CMP today to make sure that there is not any lymphopenia or hepatotoxicity. Around that time the next visit we may need to check an MRI to determine if there is any subclinical progression. 2.   Continue medications for trigeminal neuralgia.    Add meloxicam for back and leg pain.   3.   Exercise and stay active. 4.   Return to clinic 3-4 months or sooner based on MRI if there are new or worsening neurologic symptoms.    Muscab Brenneman A. Epimenio Foot, MD, PhD 08/04/2017, 1:58 PM Certified in Neurology, Clinical Neurophysiology, Sleep Medicine, Pain Medicine and Neuroimaging  [facility] [address] [place], [place] [address] [phone]

## 2017-08-05 ENCOUNTER — Telehealth: Payer: Self-pay | Admitting: *Deleted

## 2017-08-05 LAB — CBC WITH DIFFERENTIAL/PLATELET
Basophils Absolute: 0.1 10*3/uL (ref 0.0–0.2)
Basos: 1 %
EOS (ABSOLUTE): 0.4 10*3/uL (ref 0.0–0.4)
EOS: 4 %
HEMATOCRIT: 41.1 % (ref 34.0–46.6)
Hemoglobin: 13.9 g/dL (ref 11.1–15.9)
Immature Grans (Abs): 0.1 10*3/uL (ref 0.0–0.1)
Immature Granulocytes: 1 %
Lymphocytes Absolute: 2.7 10*3/uL (ref 0.7–3.1)
Lymphs: 29 %
MCH: 33.2 pg — AB (ref 26.6–33.0)
MCHC: 33.8 g/dL (ref 31.5–35.7)
MCV: 98 fL — AB (ref 79–97)
MONOS ABS: 0.8 10*3/uL (ref 0.1–0.9)
Monocytes: 8 %
NEUTROS ABS: 5.5 10*3/uL (ref 1.4–7.0)
Neutrophils: 57 %
PLATELETS: 296 10*3/uL (ref 150–379)
RBC: 4.19 x10E6/uL (ref 3.77–5.28)
RDW: 13.3 % (ref 12.3–15.4)
WBC: 9.5 10*3/uL (ref 3.4–10.8)

## 2017-08-05 LAB — COMPREHENSIVE METABOLIC PANEL
A/G RATIO: 1.5 (ref 1.2–2.2)
ALT: 23 IU/L (ref 0–32)
AST: 20 IU/L (ref 0–40)
Albumin: 4.6 g/dL (ref 3.6–4.8)
Alkaline Phosphatase: 51 IU/L (ref 39–117)
BILIRUBIN TOTAL: 0.2 mg/dL (ref 0.0–1.2)
BUN/Creatinine Ratio: 19 (ref 12–28)
BUN: 13 mg/dL (ref 8–27)
CALCIUM: 10.2 mg/dL (ref 8.7–10.3)
CHLORIDE: 100 mmol/L (ref 96–106)
CO2: 24 mmol/L (ref 20–29)
Creatinine, Ser: 0.7 mg/dL (ref 0.57–1.00)
GFR, EST AFRICAN AMERICAN: 104 mL/min/{1.73_m2} (ref >59)
GFR, EST NON AFRICAN AMERICAN: 91 mL/min/{1.73_m2} (ref >59)
GLOBULIN, TOTAL: 3 g/dL (ref 1.5–4.5)
Glucose: 115 mg/dL — ABNORMAL HIGH (ref 65–99)
POTASSIUM: 4.6 mmol/L (ref 3.5–5.2)
SODIUM: 137 mmol/L (ref 134–144)
TOTAL PROTEIN: 7.6 g/dL (ref 6.0–8.5)

## 2017-08-05 NOTE — Telephone Encounter (Signed)
-----   Message from Asa Lente, MD sent at 08/05/2017  9:40 AM EDT ----- Please let the patient know that the lab work is fine.

## 2017-08-05 NOTE — Telephone Encounter (Signed)
Pt. aware labs done in our office are fine/fim 

## 2017-08-14 ENCOUNTER — Other Ambulatory Visit: Payer: Self-pay | Admitting: Neurology

## 2017-08-15 DIAGNOSIS — Z23 Encounter for immunization: Secondary | ICD-10-CM | POA: Diagnosis not present

## 2017-08-21 ENCOUNTER — Other Ambulatory Visit: Payer: Self-pay | Admitting: Family Medicine

## 2017-08-21 NOTE — Telephone Encounter (Signed)
Medication refilled per protocol. 

## 2017-09-18 ENCOUNTER — Other Ambulatory Visit: Payer: Self-pay | Admitting: Family Medicine

## 2017-11-17 ENCOUNTER — Other Ambulatory Visit: Payer: Self-pay | Admitting: Family Medicine

## 2017-12-04 ENCOUNTER — Other Ambulatory Visit: Payer: Self-pay | Admitting: Family Medicine

## 2017-12-11 ENCOUNTER — Other Ambulatory Visit: Payer: Self-pay | Admitting: Neurology

## 2018-01-08 ENCOUNTER — Other Ambulatory Visit: Payer: Self-pay | Admitting: Neurology

## 2018-01-12 ENCOUNTER — Other Ambulatory Visit: Payer: Self-pay | Admitting: Neurology

## 2018-01-19 ENCOUNTER — Emergency Department (HOSPITAL_COMMUNITY)
Admission: EM | Admit: 2018-01-19 | Discharge: 2018-01-19 | Disposition: A | Discharge status: Home / Self Care | Payer: Medicare Other | Attending: Emergency Medicine | Admitting: Emergency Medicine

## 2018-01-19 ENCOUNTER — Encounter (HOSPITAL_COMMUNITY): Payer: Self-pay

## 2018-01-19 DIAGNOSIS — R531 Weakness: Secondary | ICD-10-CM | POA: Insufficient documentation

## 2018-01-19 DIAGNOSIS — R404 Transient alteration of awareness: Secondary | ICD-10-CM | POA: Diagnosis not present

## 2018-01-19 DIAGNOSIS — Z7982 Long term (current) use of aspirin: Secondary | ICD-10-CM | POA: Insufficient documentation

## 2018-01-19 DIAGNOSIS — R200 Anesthesia of skin: Secondary | ICD-10-CM | POA: Diagnosis not present

## 2018-01-19 DIAGNOSIS — I10 Essential (primary) hypertension: Secondary | ICD-10-CM | POA: Diagnosis not present

## 2018-01-19 DIAGNOSIS — Z79899 Other long term (current) drug therapy: Secondary | ICD-10-CM | POA: Insufficient documentation

## 2018-01-19 DIAGNOSIS — E039 Hypothyroidism, unspecified: Secondary | ICD-10-CM | POA: Diagnosis not present

## 2018-01-19 DIAGNOSIS — F17210 Nicotine dependence, cigarettes, uncomplicated: Secondary | ICD-10-CM | POA: Insufficient documentation

## 2018-01-19 DIAGNOSIS — R4789 Other speech disturbances: Secondary | ICD-10-CM | POA: Diagnosis not present

## 2018-01-19 LAB — COMPREHENSIVE METABOLIC PANEL
ALK PHOS: 47 U/L (ref 38–126)
ALT: 38 U/L (ref 14–54)
AST: 26 U/L (ref 15–41)
Albumin: 4.1 g/dL (ref 3.5–5.0)
Anion gap: 9 (ref 5–15)
BILIRUBIN TOTAL: 0.7 mg/dL (ref 0.3–1.2)
BUN: 31 mg/dL — AB (ref 6–20)
CALCIUM: 10.7 mg/dL — AB (ref 8.9–10.3)
CHLORIDE: 102 mmol/L (ref 101–111)
CO2: 29 mmol/L (ref 22–32)
CREATININE: 1.15 mg/dL — AB (ref 0.44–1.00)
GFR, EST AFRICAN AMERICAN: 56 mL/min — AB (ref >60)
GFR, EST NON AFRICAN AMERICAN: 48 mL/min — AB (ref >60)
Glucose, Bld: 90 mg/dL (ref 65–99)
Potassium: 3.4 mmol/L — ABNORMAL LOW (ref 3.5–5.1)
Sodium: 140 mmol/L (ref 135–145)
TOTAL PROTEIN: 7.5 g/dL (ref 6.5–8.1)

## 2018-01-19 LAB — CBC WITH DIFFERENTIAL/PLATELET
BASOS ABS: 0.1 10*3/uL (ref 0.0–0.1)
BASOS PCT: 1 %
EOS ABS: 0.4 10*3/uL (ref 0.0–0.7)
EOS PCT: 3 %
HCT: 40.5 % (ref 36.0–46.0)
HEMOGLOBIN: 13.9 g/dL (ref 12.0–15.0)
Lymphocytes Relative: 19 %
Lymphs Abs: 2.3 10*3/uL (ref 0.7–4.0)
MCH: 32 pg (ref 26.0–34.0)
MCHC: 34.3 g/dL (ref 30.0–36.0)
MCV: 93.1 fL (ref 78.0–100.0)
Monocytes Absolute: 0.8 10*3/uL (ref 0.1–1.0)
Monocytes Relative: 6 %
NEUTROS PCT: 71 %
Neutro Abs: 9 10*3/uL — ABNORMAL HIGH (ref 1.7–7.7)
PLATELETS: 272 10*3/uL (ref 150–400)
RBC: 4.35 MIL/uL (ref 3.87–5.11)
RDW: 12.8 % (ref 11.5–15.5)
WBC: 12.5 10*3/uL — AB (ref 4.0–10.5)

## 2018-01-19 LAB — CBG MONITORING, ED: Glucose-Capillary: 92 mg/dL (ref 65–99)

## 2018-01-19 MED ORDER — SODIUM CHLORIDE 0.9 % IV BOLUS
1000.0000 mL | Freq: Once | INTRAVENOUS | Status: AC
  Administered 2018-01-19: 1000 mL via INTRAVENOUS

## 2018-01-19 MED ORDER — FLUTICASONE PROPIONATE 50 MCG/ACT NA SUSP: NASAL | 0 refills | Status: DC

## 2018-01-19 NOTE — Discharge Instructions (Signed)
As discussed, your evaluation today has been largely reassuring.  But, it is important that you monitor your condition carefully, and do not hesitate to return to the ED if you develop new, or concerning changes in your condition. ? ?Otherwise, please follow-up with your physician for appropriate ongoing care. ? ?

## 2018-01-19 NOTE — ED Triage Notes (Signed)
Pt BIB [facility] from [facility] for generalized weakness with right leg dragging, facial droop since 11am today and slurred speech since Saturday. Pt has hx of MS and Bells Palsy. Pt a.o, no neuro deficits noted upon assessment, VSS. nad

## 2018-01-19 NOTE — ED Provider Notes (Signed)
[facility] EMERGENCY DEPARTMENT Provider Note   CSN: [id number] Arrival date & time: 01/19/18  1523    History   Chief Complaint Chief Complaint  Patient presents with  . Weakness    HPI Melanie Richardson is a 67 y.o. female.  HPI  Patient presents with concern of possible facial droop. Patient states that she has been returned to normal, but earlier today had possible facial droop, as well as ongoing sinus congestion. Patient has multiple medical issues including multiple sclerosis, for which he takes medication regularly, denies any recent changes in medicine. Today, with concern for ongoing sinus congestion, she went to urgent care. She was sent here after she is on a possible facial droop. She does have history of Bell's palsy.  Past Medical History:  Diagnosis Date  . Hypertension   . MS (multiple sclerosis) (HCC)   . Osteopenia 05/2014   T score -1.8 FRAX 16%/4.7%  . Thyroid disease    Hypothyroid  . Trigeminal neuralgia     Patient Active Problem List   Diagnosis Date Noted  . 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 High risk medication use 02/04/2017  . Right-sided Bell's palsy 12/29/2016  . Allergic rhinitis 08/07/2015  . Hypertension 08/06/2015  . Hypertriglyceridemia 08/06/2015  . Anxiety state 07/25/2015  . Dysesthesia 10/26/2014  . Abnormality of gait 10/26/2014  . Chronic fatigue 10/26/2014  . Sinusitis, acute 07/17/2013  . Wheezing 07/17/2013  . Hypothyroidism 07/17/2013  . MS (multiple sclerosis) (HCC)   . Trigeminal neuralgia     Past Surgical History:  Procedure Laterality Date  . BACK SURGERY  2011  . ELBOW SURGERY  1999   shattered elbow  . feet surgery  1990  . gamma knife  2005  . radiofrquency rhizotomy  [id number]  . underarm surgery  1988     OB History    Gravida  2   Para  2   Term      Preterm      AB      Living  2     SAB      TAB      Ectopic      Multiple      Live  Births               Home Medications    Prior to Admission medications   Medication Sig Start Date End Date Taking? Authorizing Provider  albuterol (PROVENTIL HFA;VENTOLIN HFA) 108 (90 BASE) MCG/ACT inhaler Inhale 2 puffs into the lungs every 6 (six) hours as needed for wheezing. 08/06/15  Yes Powell, Velna Hatchet, MD  alendronate (FOSAMAX) 70 MG tablet TAKE 1 TABLET BY MOUTH EVERY 7 DAYS WITH FULL GLASS OF WATER ON AN EMPTY STOMACH 11/04/16  Yes Fontaine, Nadyne Coombes, MD  amitriptyline (ELAVIL) 50 MG tablet TAKE 1 TABLET BY MOUTH EVERYDAY AT BEDTIME 01/12/18  Yes Sater, Pearletha Furl, MD  aspirin 81 MG tablet Take 81 mg by mouth daily.   Yes [provider]  carbamazepine (TEGRETOL XR) 400 MG 12 hr tablet TAKE 1 TABLET BY MOUTH THREE TIMES A DAY 01/08/18  Yes Sater, Pearletha Furl, MD  Cholecalciferol (VITAMIN D PO) Take 400 Units by mouth daily.    Yes [provider]  cycloSPORINE (RESTASIS) 0.05 % ophthalmic emulsion Place 1 drop into both eyes 2 (two) times daily.   Yes [provider]  hydrochlorothiazide (HYDRODIURIL) 25 MG tablet TAKE 1 TABLET BY MOUTH EVERY DAY IN THE  MORNING 12/11/17  Yes Sater, Pearletha Furl, MD  ipratropium (ATROVENT) 0.06 % nasal spray Place 1 spray into the nose daily as needed for rhinitis.  12/02/16  Yes [provider]  lamoTRIgine (LAMICTAL) 150 MG tablet TAKE 1 TABLET (150 MG TOTAL) BY MOUTH DAILY. 10/28/14  Yes Sater, Pearletha Furl, MD  levothyroxine (SYNTHROID, LEVOTHROID) 50 MCG tablet Take 1 tablet (50 mcg total) by mouth daily before breakfast. Requires office visit before any further refills can be given. 12/04/17  Yes Noma, Velna Hatchet, MD  losartan (COZAAR) 100 MG tablet TAKE 1 TABLET BY MOUTH EVERY DAY 11/17/17  Yes [place], Velna Hatchet, MD  LYRICA 75 MG capsule TAKE 1 CAPSULE BY MOUTH TWICE A DAY 07/27/17  Yes Sater, Pearletha Furl, MD  metoprolol succinate (TOPROL-XL) 25 MG 24 hr tablet TAKE 1 TABLET BY MOUTH EVERY DAY 09/18/17  Yes [place], Velna Hatchet, MD  Multiple Vitamin (MULTIVITAMIN) tablet Take 1 tablet by mouth daily.   Yes [provider]  predniSONE (DELTASONE) 10 MG tablet TAKE 1/2 TO 1 TABLET BY MOUTH 3 TIMES DAILY Patient taking differently: TAKE 10 MG BY MOUTH 3 TIMES DAILY 08/14/17  Yes Sater, Pearletha Furl, MD  diazepam (VALIUM) 5 MG tablet TAKE 1 TABLET BY MOUTH EVERY 12 HOURS AS NEEDED Patient not taking: Reported on 01/19/2018 07/27/17   Sater, Pearletha Furl, MD  fluticasone (FLONASE) 50 MCG/ACT nasal spray PLACE 2 SPRAYS INTO THE NOSE DAILY. Patient not taking: Reported on 01/19/2018 08/06/15   Salley Scarlet, MD  HYDROcodone-acetaminophen [facility]) 7.5-325 MG tablet Take 1 tablet by mouth every 6 (six) hours as needed for moderate pain. Patient not taking: Reported on 01/19/2018 08/04/17   Sater, Pearletha Furl, MD  meloxicam (MOBIC) 7.5 MG tablet Take 1 tablet (7.5 mg total) by mouth daily as needed for pain. Patient not taking: Reported on 01/19/2018 08/04/17   Asa Lente, MD  metoprolol succinate (TOPROL-XL) 25 MG 24 hr tablet Take 3 tablets (75 mg total) by mouth daily. Patient not taking: Reported on 01/19/2018 12/24/16 01/20/24  Little, Ambrose Finland, MD    Family History Family History  Problem Relation Age of Onset  . Heart disease Mother   . Hypertension Father   . Cancer Father        colon  . Stroke Father   . Breast cancer Maternal Aunt 50  . Breast cancer Cousin 61    Social History Social History   Tobacco Use  . Smoking status: Current Every Day Smoker    Packs/day: 1.00    Types: Cigarettes  . Smokeless tobacco: Never Used  Substance Use Topics  . Alcohol use: Yes    Alcohol/week: 0.0 oz    Comment: rarely  . Drug use: No     Allergies   Ciprofloxacin; Codeine; and Erythromycin   Review of Systems Review of Systems  Constitutional:       Per HPI, otherwise negative  HENT:       Per HPI, otherwise negative  Respiratory:       Per HPI, otherwise negative  Cardiovascular:        Per HPI, otherwise negative  Gastrointestinal: Negative for vomiting.  Endocrine:       Negative aside from HPI  Genitourinary:       Neg aside from HPI   Musculoskeletal:       Per HPI, otherwise negative  Skin: Negative.   Neurological: Positive for speech difficulty, weakness and numbness. Negative for syncope.  Physical Exam Updated Vital Signs BP (!) 153/92   Pulse 76   Temp 99.4 F (37.4 C) (Oral)   Resp 18   LMP 10/06/1996   SpO2 100%   Physical Exam  Constitutional: She is oriented to person, place, and time. She appears well-developed and well-nourished. No distress.  HENT:  Head: Normocephalic and atraumatic.  Eyes: Conjunctivae and EOM are normal.  Cardiovascular: Normal rate and regular rhythm.  Pulmonary/Chest: Effort normal and breath sounds normal. No stridor. No respiratory distress.  Abdominal: She exhibits no distension.  Musculoskeletal: She exhibits no edema.  Neurological: She is alert and oriented to person, place, and time. She displays no atrophy and no tremor. No cranial nerve deficit. She exhibits normal muscle tone. She displays no seizure activity. Coordination normal.  Skin: Skin is warm and dry.  Psychiatric: She has a normal mood and affect.  Nursing note and vitals reviewed.    ED Treatments / Results  Labs (all labs ordered are listed, but only abnormal results are displayed) Labs Reviewed  COMPREHENSIVE METABOLIC PANEL - Abnormal; Notable for the following components:      Result Value   Potassium 3.4 (*)    BUN 31 (*)    Creatinine, Ser 1.15 (*)    Calcium 10.7 (*)    GFR calc non Af Amer 48 (*)    GFR calc Af Amer 56 (*)    All other components within normal limits  CBC WITH DIFFERENTIAL/PLATELET - Abnormal; Notable for the following components:   WBC 12.5 (*)    Neutro Abs 9.0 (*)    All other components within normal limits  CBG MONITORING, ED    EKG None  Radiology No results found.  Procedures Procedures  (including critical care time)  Medications Ordered in ED Medications  sodium chloride 0.9 % bolus 1,000 mL (0 mLs Intravenous Stopped 01/19/18 1911)     Initial Impression / Assessment and Plan / ED Course  I have reviewed the triage vital signs and the nursing notes.  Pertinent labs & imaging results that were available during my care of the patient were reviewed by me and considered in my medical decision making (see chart for details).     8:11 PM On repeat exam patient is awake, alert, continues to deny any ongoing issues. Caregiver is now present, which is concern of possible acute sinusitis. On repeat exam the patient has no tenderness to palpation throughout any of the anterior face with palpation, denies any fever, denies any active drainage, though she has had some recently, she describes 2 seasonal allergies. Discussed ongoing Allegra, as well as initiation of Flonase, and ENT follow-up as needed. With no ongoing complaints, reassuring labs, no speech difficulty, there is low suspicion for MS exacerbation, stroke, or other acute new pathology. Patient does have minor leukocytosis, will follow up with primary care.  Final Clinical Impressions(s) / ED Diagnoses  Weakness   Gerhard Munch, MD 01/19/18 2011

## 2018-01-20 ENCOUNTER — Other Ambulatory Visit: Payer: Self-pay | Admitting: Family Medicine

## 2018-01-27 ENCOUNTER — Other Ambulatory Visit: Payer: Self-pay | Admitting: Neurology

## 2018-02-02 ENCOUNTER — Ambulatory Visit (INDEPENDENT_AMBULATORY_CARE_PROVIDER_SITE_OTHER): Payer: Medicare Other | Admitting: Neurology

## 2018-02-02 ENCOUNTER — Encounter: Payer: Self-pay | Admitting: Neurology

## 2018-02-02 ENCOUNTER — Other Ambulatory Visit: Payer: Self-pay

## 2018-02-02 VITALS — BP 169/94 | HR 96 | Resp 16 | Ht 65.0 in | Wt 133.5 lb

## 2018-02-02 DIAGNOSIS — G35 Multiple sclerosis: Secondary | ICD-10-CM

## 2018-02-02 DIAGNOSIS — G500 Trigeminal neuralgia: Secondary | ICD-10-CM

## 2018-02-02 DIAGNOSIS — Z79899 Other long term (current) drug therapy: Secondary | ICD-10-CM | POA: Diagnosis not present

## 2018-02-02 DIAGNOSIS — E039 Hypothyroidism, unspecified: Secondary | ICD-10-CM

## 2018-02-02 DIAGNOSIS — F411 Generalized anxiety disorder: Secondary | ICD-10-CM

## 2018-02-02 DIAGNOSIS — R269 Unspecified abnormalities of gait and mobility: Secondary | ICD-10-CM

## 2018-02-02 MED ORDER — LEVOTHYROXINE SODIUM 50 MCG PO TABS: 50.0000 ug | ORAL_TABLET | Freq: Every day | ORAL | 2 refills | Status: DC

## 2018-02-02 MED ORDER — HYDROCODONE-ACETAMINOPHEN 7.5-325 MG PO TABS: 1.0000 | ORAL_TABLET | Freq: Three times a day (TID) | ORAL | 0 refills | Status: DC | PRN

## 2018-02-02 NOTE — Progress Notes (Signed)
[facility]  PATIENT: Melanie Richardson DOB: 01/02/1951  REFERRING CLINICIAN: Kingsley Spittle  HISTORY FROM: Patient MS   REASON FOR VISIT: MS and trigeminal Neuralgia   HISTORICAL  CHIEF COMPLAINT:  Chief Complaint  Patient presents with  . Multiple Sclerosis    Sts. she continues to tolerate Tecfidera well.  Sts. facial pain has been pretty well controlled.  Last exacerbation of pain was several wks. ago and only lasted for a day or so/fim  . Trigeminal Neuralgia    HISTORY OF PRESENT ILLNESS:  Melanie Richardson is a 67 year old woman with multiple sclerosis.  Update 02/02/2018: She continues on Tecfidera and generally tolerates it well.  She has not had any exacerbations since starting that medication.  She notes no new numbness or weakness and feels her gait is stable.  She has no recent falls.  She has had difficulties with balance more than strength and uses a walker.  She is hoping to get back to a cane.   The trigeminal neuralgia is doing very well with only one recent cluster earlier this year.   She is on lamictal and tegretol and amitriptyline.    She notes no new MS difficulty with vision.   She will be getting cataracts removed soon.   She has mild urinary frequency and urgency not bad enough to treat.  She notes some fatigue.  Most nights she sleeps well.  She denies any significant problem with depression and has mild anxiety. She has a recent divorce.     She notes no difficulties with her cognition.     She has some LBP, mostly in the left LB and buttock.   Patches help.   She also notes some arthritis in her legs.        Update 08/04/2017:   She switched to Tecfidera May 2018 due to new activity on the MRI and an exacerbation earlier this year. She is tolerating it well.   She denies any new exacerbations.   She has not had any GI issues or flushing.     She notes no new numbness, weakness or gait issues.   No recent falls.   Balance is poor  (old) and she  uses a walker.   Her trigeminal neuralgia has not acted up at all this year.   Her vision worsened when she had Bell's palsy and she feels they are doing better again.   Bladder function is doing well.   No significant urgency.     Fatigue has been better since the last visit.      She sleeps well at night.   She feels mood is doing ok and she denies much depression or anxiety.    She notes no significant problems with cognition.  Allergies are bothering her more and she has a lot of nasal drainage.   She gets lower back pain helped by lidocaine patches (OTC).    Sometimes she also has proximal leg pain.  _______________________________________ From 02/04/2017 MS:    Her MRI a couple weeks ago showed a few lesions not present in 2016 implying continued inflammation .   We discussed considering a different medication   Vision:   She notes some blurry vision on the left. She also feels the left eye is dry (facial weakness and reduced blinks are on the right)  About 7 weeks ago, she had what she feels was a sinus infection and felt off balanced.   She fell in a parking lot, hitting  her head but did not lose consciousness.   About 2 weeks ago, her son noted the right face was drooping and she went to [facility] ED last week.  She did not note headache at the onset.   She noted mild lip numbness but the weakness was the main symptom   She notes water dribbling out of her mouth when she drank.    She noted mild change in vision last week but back to baseline.   .    No change in leg or arm numbness or weakness.  No change in gait or bladder.     In the ED, she was given Valtrex and prednisone.  She had a CT of the head.   It shows White matter changes c/w her MS and a scalp hematoma on her left.    MS:   She is on Rebif and tolerates it well.    Before current symptoms, she had no recent exacerbation.  Trigeminal neuralgia:    TN pain has done fairly well lately . She has had only occasional spasms of pain on the  right side of her face..   She has received the most benefit from combination of the antiepileptics (Tegretol, Pregabalin and Lamotrigine) and Amitriptyline.   She is not needing as much hydrocodone for pain. Stress often triggers a spell.    In the past, her spells were so frequent and severe that activity like eating would trigger more severe pain.   In the past, she has had a gamma knife procedure (2005) and radiofrequency ablations 2. These procedures did not help her much at the time. Those procedures left her with reduced sensation on the right.  Gait/strength/sensation:   She feels off balanced and had the one fall.  Gait is worse the past 1/2 year in general, especially the past 1-2 months. .    She now must use a cane.  She has no major weakness, but her right leg seems weaker when she is hot.   She has no numbness in arms or legs.    Bladder:   She denies any bladder issues.      Vision/hearing:   She feels vision is about the same but notes some blurry vision last week, .   She feels the right hearing is not as good as the left.      Fatigue/sleep:   Fatigue is mild most days.  She continues to have some insomnia, worse with sleep maintenance. Ambien and amitriptyline help her insomnia.   Mood/cognition:   She notes depression and mild anxiety and has had more stress the past year.     LBP/leg pain:  She has low back pain and right leg radiculopathy pain at times. This is helped a lot by Lyrica. She notes she limps some if temperatures are warmer.    MS History:   She had the onset of right sided facial numbness now pregnant during the 1970s. That numbness resolved over the next month or so. However, in the mid 1990s she began to experience pain in the same distribution. She had an MRI of the brain performed by Dr. Buzzy Han in 1998. It was consistent with multiple sclerosis.  She had an LP in the 1970's and the CSF was not significantly abnormal to diagnose MS.   She has been on Rebif since  2004.  She had an exacerbation of March 2018 with worsening gait, left visual changes. She also had facial weakness that could be a Bell's  palsy.   She switch to Tecfidera May 2018 due to the exacerbation and new activity on the MRI.  REVIEW OF SYSTEMS:  Constitutional: No fevers, chills, sweats, or change in appetite.   Some fatigue and insomnia Eyes: No visual changes, double vision, eye pain Ear, nose and throat: No hearing loss, ear pain, nasal congestion, sore throat Cardiovascular: No chest pain, palpitations Respiratory:  No shortness of breath at rest or with exertion.   No wheezes GastrointestinaI: No nausea, vomiting, diarrhea, abdominal pain, fecal incontinence Genitourinary:  No dysuria, urinary retention or frequency.  No nocturia. Musculoskeletal:  No neck pain, back pain Integumentary: No rash, pruritus, skin lesions Neurological: as above Psychiatric: No depression at this time.  No anxiety Endocrine: No palpitations, diaphoresis, change in appetite, change in weigh or increased thirst Hematologic/Lymphatic:  No anemia, purpura, petechiae. Allergic/Immunologic: No itchy/runny eyes, nasal congestion, recent allergic reactions, rashes  ALLERGIES: Allergies  Allergen Reactions  . Ciprofloxacin Other (See Comments)    Dizziness and headache  . Codeine Other (See Comments)    GI upset  . Erythromycin Other (See Comments)    Unknown    HOME MEDICATIONS: Outpatient Medications Prior to Visit  Medication Sig Dispense Refill  . albuterol (PROVENTIL HFA;VENTOLIN HFA) 108 (90 BASE) MCG/ACT inhaler Inhale 2 puffs into the lungs every 6 (six) hours as needed for wheezing. 1 Inhaler 6  . alendronate (FOSAMAX) 70 MG tablet TAKE 1 TABLET BY MOUTH EVERY 7 DAYS WITH FULL GLASS OF WATER ON AN EMPTY STOMACH 4 tablet 12  . amitriptyline (ELAVIL) 50 MG tablet TAKE 1 TABLET BY MOUTH EVERYDAY AT BEDTIME 90 tablet 0  . aspirin 81 MG tablet Take 81 mg by mouth daily.    . carbamazepine  (TEGRETOL XR) 400 MG 12 hr tablet TAKE 1 TABLET BY MOUTH THREE TIMES A DAY 90 tablet 11  . Cholecalciferol (VITAMIN D PO) Take 400 Units by mouth daily.     . cycloSPORINE (RESTASIS) 0.05 % ophthalmic emulsion Place 1 drop into both eyes 2 (two) times daily.    . diazepam (VALIUM) 5 MG tablet TAKE 1 TABLET BY MOUTH EVERY 12 HOURS AS NEEDED 60 tablet 5  . fluticasone (FLONASE) 50 MCG/ACT nasal spray PLACE 2 SPRAYS INTO THE NOSE DAILY. 16 g 0  . hydrochlorothiazide (HYDRODIURIL) 25 MG tablet TAKE 1 TABLET BY MOUTH EVERY DAY IN THE MORNING 30 tablet 5  . ipratropium (ATROVENT) 0.06 % nasal spray Place 1 spray into the nose daily as needed for rhinitis.     Marland Kitchen lamoTRIgine (LAMICTAL) 150 MG tablet TAKE 1 TABLET (150 MG TOTAL) BY MOUTH DAILY. 90 tablet 3  . losartan (COZAAR) 100 MG tablet TAKE 1 TABLET BY MOUTH EVERY DAY 90 tablet 0  . LYRICA 75 MG capsule TAKE ONE CAPSULE BY MOUTH TWICE A DAY 60 capsule 5  . meloxicam (MOBIC) 7.5 MG tablet Take 1 tablet (7.5 mg total) by mouth daily as needed for pain. 30 tablet 5  . metoprolol succinate (TOPROL-XL) 25 MG 24 hr tablet Take 3 tablets (75 mg total) by mouth daily. 42 tablet 0  . metoprolol succinate (TOPROL-XL) 25 MG 24 hr tablet TAKE 1 TABLET BY MOUTH EVERY DAY 30 tablet 6  . Multiple Vitamin (MULTIVITAMIN) tablet Take 1 tablet by mouth daily.    . predniSONE (DELTASONE) 10 MG tablet TAKE 1/2 TO 1 TABLET BY MOUTH 3 TIMES DAILY (Patient taking differently: TAKE 10 MG BY MOUTH 3 TIMES DAILY) 90 tablet 3  . HYDROcodone-acetaminophen (NORCO)  7.5-325 MG tablet Take 1 tablet by mouth every 6 (six) hours as needed for moderate pain. 120 tablet 0  . levothyroxine (SYNTHROID, LEVOTHROID) 50 MCG tablet Take 1 tablet (50 mcg total) by mouth daily before breakfast. Requires office visit before any further refills can be given. 30 tablet 0   No facility-administered medications prior to visit.     PAST MEDICAL HISTORY: Past Medical History:  Diagnosis Date  .  Hypertension   . MS (multiple sclerosis) (HCC)   . Osteopenia 05/2014   T score -1.8 FRAX 16%/4.7%  . Thyroid disease    Hypothyroid  . Trigeminal neuralgia     PAST SURGICAL HISTORY: Past Surgical History:  Procedure Laterality Date  . BACK SURGERY  2011  . ELBOW SURGERY  1999   shattered elbow  . feet surgery  1990  . gamma knife  2005  . radiofrquency rhizotomy  [id number]  . underarm surgery  1988    FAMILY HISTORY: Family History  Problem Relation Age of Onset  . Heart disease Mother   . Hypertension Father   . Cancer Father        colon  . Stroke Father   . Breast cancer Maternal Aunt 50  . Breast cancer Cousin 40    SOCIAL HISTORY:  Social History   Socioeconomic History  . Marital status: Married    Spouse name: Not on file  . Number of children: 2  . Years of education: college  . Highest education level: Not on file  Occupational History    Employer: OTHER  Social Needs  . Financial resource strain: Not on file  . Food insecurity:    Worry: Not on file    Inability: Not on file  . Transportation needs:    Medical: Not on file    Non-medical: Not on file  Tobacco Use  . Smoking status: Current Every Day Smoker    Packs/day: 1.00    Types: Cigarettes  . Smokeless tobacco: Never Used  Substance and Sexual Activity  . Alcohol use: Yes    Alcohol/week: 0.0 oz    Comment: rarely  . Drug use: No  . Sexual activity: Yes    Birth control/protection: Post-menopausal    Comment: 1st intercourse 67 yo-Fewer than 5 partners  Lifestyle  . Physical activity:    Days per week: Not on file    Minutes per session: Not on file  . Stress: Not on file  Relationships  . Social connections:    Talks on phone: Not on file    Gets together: Not on file    Attends religious service: Not on file    Active member of club or organization: Not on file    Attends meetings of clubs or organizations: Not on file    Relationship status: Not on file  . Intimate  partner violence:    Fear of current or ex partner: Not on file    Emotionally abused: Not on file    Physically abused: Not on file    Forced sexual activity: Not on file  Other Topics Concern  . Not on file  Social History Narrative   Patient lives at home alone, has 2 children   Patient is right handed   Educational level is some college   Caffeine consumption is 3 cups a day     PHYSICAL EXAM  Vitals:   02/02/18 1302  BP: (!) 169/94  Pulse: 96  Resp: 16  Weight: 133 lb 8  oz (60.6 kg)  Height: 5\' 5"  (1.651 m)    Body mass index is 22.22 kg/m.   General: The patient is well-developed and well-nourished and in no acute distress    Neurologic Exam  Mental status: The patient is alert and oriented x 3 at the time of the examination. The patient has apparent normal recent and remote memory, with an apparently normal attention span and concentration ability.   Speech is normal.  Cranial nerves: Extraocular movements are full.  Facial strength and sensation is normal.  Trapezius strength is strong.  Motor:   Muscle bulk and tone are normal. Strength is  5 / 5 in arms and 4+/5 in legs.   .   Sensory: Sensory testing is intact to temperature and touch but vibration decreased in right foot.  Coordination: Cerebellar testing shows good finger-nose-finger slightly reduced heel-to-shin..  Gait and station: Station is normal and gait has reduced stride and is wide.   She can take steps with unilateral support but walks much better with walker   She cannot tandem walk.   Romberg is positive.   Reflexes: Deep tendon reflexes are symmetric and normal bilaterally.     DIAGNOSTIC DATA (LABS, IMAGING, TESTING) - I reviewed patient records, labs, notes, testing and imaging myself where available.  Lab Results  Component Value Date   WBC 12.5 (H) 01/19/2018   HGB 13.9 01/19/2018   HCT 40.5 01/19/2018   MCV 93.1 01/19/2018   PLT 272 01/19/2018      Component Value Date/Time     NA 140 01/19/2018 1531   NA 137 08/04/2017 1359   K 3.4 (L) 01/19/2018 1531   CL 102 01/19/2018 1531   CO2 29 01/19/2018 1531   GLUCOSE 90 01/19/2018 1531   BUN 31 (H) 01/19/2018 1531   BUN 13 08/04/2017 1359   CREATININE 1.15 (H) 01/19/2018 1531   CREATININE 0.80 01/22/2017 1249   CALCIUM 10.7 (H) 01/19/2018 1531   PROT 7.5 01/19/2018 1531   PROT 7.6 08/04/2017 1359   ALBUMIN 4.1 01/19/2018 1531   ALBUMIN 4.6 08/04/2017 1359   AST 26 01/19/2018 1531   ALT 38 01/19/2018 1531   ALKPHOS 47 01/19/2018 1531   BILITOT 0.7 01/19/2018 1531   BILITOT 0.2 08/04/2017 1359   GFRNONAA 48 (L) 01/19/2018 1531   GFRNONAA 77 01/22/2017 1249   GFRAA 56 (L) 01/19/2018 1531   GFRAA 89 01/22/2017 1249    Lab Results  Component Value Date   TSH 5.23 (H) 08/07/2016       ASSESSMENT AND PLAN  MS (multiple sclerosis) (HCC) - Plan: CBC with Differential/Platelet, TSH  Trigeminal neuralgia  Hypothyroidism, unspecified type  Abnormality of gait  Anxiety state  High risk medication use - Plan: CBC with Differential/Platelet, TSH   1.  She will continue Tecfidera. We will check a CBC to determine if there is any lymphopenia 2.   Continue medications for trigeminal neuralgia.        3.   Exercise and stay active. 4.   Okay to try a cane around the house but advised using a walker for longer distances and outside of the house.   5.  Return to clinic 3-4 months or sooner based on MRI if there are new or worsening neurologic symptoms.    Audrea Bolte A. Epimenio Foot, MD, PhD 02/02/2018, 4:10 PM Certified in Neurology, Clinical Neurophysiology, Sleep Medicine, Pain Medicine and Neuroimaging  [facility] [address] [place], [place] [address] [phone]

## 2018-02-03 ENCOUNTER — Telehealth: Payer: Self-pay | Admitting: *Deleted

## 2018-02-03 LAB — CBC WITH DIFFERENTIAL/PLATELET
BASOS ABS: 0.1 10*3/uL (ref 0.0–0.2)
Basos: 1 %
EOS (ABSOLUTE): 0.3 10*3/uL (ref 0.0–0.4)
Eos: 4 %
Hematocrit: 35.4 % (ref 34.0–46.6)
Hemoglobin: 11.9 g/dL (ref 11.1–15.9)
IMMATURE GRANS (ABS): 0 10*3/uL (ref 0.0–0.1)
Immature Granulocytes: 1 %
LYMPHS: 32 %
Lymphocytes Absolute: 2.6 10*3/uL (ref 0.7–3.1)
MCH: 30.8 pg (ref 26.6–33.0)
MCHC: 33.6 g/dL (ref 31.5–35.7)
MCV: 92 fL (ref 79–97)
MONOS ABS: 0.7 10*3/uL (ref 0.1–0.9)
Monocytes: 9 %
NEUTROS ABS: 4.5 10*3/uL (ref 1.4–7.0)
Neutrophils: 53 %
PLATELETS: 286 10*3/uL (ref 150–379)
RBC: 3.86 x10E6/uL (ref 3.77–5.28)
RDW: 13.7 % (ref 12.3–15.4)
WBC: 8.3 10*3/uL (ref 3.4–10.8)

## 2018-02-03 LAB — TSH: TSH: 4.01 u[IU]/mL (ref 0.450–4.500)

## 2018-02-03 NOTE — Telephone Encounter (Signed)
-----   Message from Asa Lente, MD sent at 02/03/2018  9:10 AM EDT ----- Please let the patient know that the lab work is fine.

## 2018-02-04 ENCOUNTER — Telehealth: Payer: Self-pay | Admitting: Neurology

## 2018-02-04 ENCOUNTER — Telehealth: Payer: Self-pay | Admitting: *Deleted

## 2018-02-04 NOTE — Telephone Encounter (Signed)
-----   Message from Richard A Sater, MD sent at 02/03/2018  9:10 AM EDT ----- Please let the patient know that the lab work is fine.  

## 2018-02-04 NOTE — Telephone Encounter (Signed)
Pt called aware Dr. Epimenio Foot has sent 2 refills for levothyroxine and then should be taken over by PCP. Pt understood and agreed to plan.

## 2018-02-04 NOTE — Telephone Encounter (Signed)
Pt with her caregiver have called in for a refill of her levothyroxine (SYNTHROID, LEVOTHROID) 50 MCG tablet  please send to  [id number] [place], [place] - [address] [phone] (Phone) [phone] (Fax)

## 2018-02-04 NOTE — Telephone Encounter (Signed)
LMOM that she should please call her pcp or endocrinologist for Synthroid r/f.  Dr. Epimenio Foot can't continue to r/f this med, as he doesn't routinely monitor her thyroid refills/fim

## 2018-02-04 NOTE — Telephone Encounter (Signed)
#   busy. Will review lab results at next appt/fim

## 2018-02-05 ENCOUNTER — Other Ambulatory Visit: Payer: Self-pay | Admitting: Neurology

## 2018-02-05 NOTE — Telephone Encounter (Signed)
Melanie Richardson called requesting a refill of her prednisone.  I called her to further discuss and left a message.  A second call was placed and also was unanswered.  She had been placed on prednisone in the past for trigeminal neuralgia flareups.  However, she is taking 15 mg daily on a regular basis.  I advised her to try to cut this down more by going down to 10 mg and then 6 weeks from now trying to go down to 5 mg.  We will try to wean further if possible.  If she gets a flareup not helped by her other medications she can take more for a few days.  She currently does not have a primary care physician and I agreed to fill her Synthroid for a couple months but strongly advised her to get in with a primary care physician soon.

## 2018-02-09 ENCOUNTER — Telehealth: Payer: Self-pay | Admitting: Neurology

## 2018-02-09 NOTE — Telephone Encounter (Signed)
Melanie Richardson/[facility] [phone] called to advise PA has expired.

## 2018-02-09 NOTE — Telephone Encounter (Signed)
Spoke with [facility] and confirmed the PA needed is for Tecfidera. They will fax the PA form for me to complete/fim

## 2018-02-11 NOTE — Telephone Encounter (Signed)
PA for Tecfidera 240mg  #180/90 completed via Cover My Meds. Key# [id number].  Dx: RRMS (G35).  Tried and failed meds: Rebif (ineffective).  Has been clinically stable on Tecfidera since May 2018./fim

## 2018-02-11 NOTE — Telephone Encounter (Signed)
Fax received from [facility], phone# [phone].  Tecfidera approved for dates 10/04/18 thru 10/05/18.  No PA # given//fim

## 2018-02-11 NOTE — Telephone Encounter (Signed)
Pt called to check on status of PA. Please call her at [phone]

## 2018-02-15 NOTE — Telephone Encounter (Signed)
Spoke with Melanie Richardson. She has spoken with [facility] and sts. has been told she now has a copay for Tecfidera.  She needs to speak with [employer] regarding pt. assistance.  I have given her [employer]'s phone# [phone])/fim

## 2018-02-15 NOTE — Telephone Encounter (Signed)
Pt requesting a call to discuss her Tecfidera. Pt didn't want to go into detail with me. Please call to advise.

## 2018-02-16 ENCOUNTER — Other Ambulatory Visit: Payer: Self-pay | Admitting: Family Medicine

## 2018-02-16 ENCOUNTER — Telehealth: Payer: Self-pay | Admitting: *Deleted

## 2018-02-16 MED ORDER — TECFIDERA 240 MG PO CPDR: DELAYED_RELEASE_CAPSULE | ORAL | 3 refills | Status: DC

## 2018-02-16 NOTE — Telephone Encounter (Signed)
Med list faxed to [facility] as requested/fim

## 2018-02-16 NOTE — Telephone Encounter (Signed)
Patient calling stating [facility] needs a list of patient's medications faxed to [phone] before delivery of Tecfidera.

## 2018-02-16 NOTE — Telephone Encounter (Signed)
Tecfidera rx. faxed to [facility] in response to faxed request from [employer] Pt. Assistance/fim

## 2018-02-22 ENCOUNTER — Telehealth: Payer: Self-pay | Admitting: Neurology

## 2018-02-22 NOTE — Telephone Encounter (Signed)
Patient needs to contact her pharmacy for the refill on the levothyronxine (Dr. Epimenio Foot sent in Rx last month with additional refill) and she will need to contact her PCP or whoever manages her blood pressure for a refill on the Losartan. I left her a message making her aware of this and to call back if needed.

## 2018-02-22 NOTE — Telephone Encounter (Signed)
Pt is asking for her 2nd refill of her levothyroxine (SYNTHROID, LEVOTHROID) 50 MCG tablet Pt asking it be sent to  [id number] [address] [address], [place] - [address] [phone] (Phone) [phone] (Fax)     Pt also asking for a refill of  losartan (COZAAR) 100 MG tablet if [name] is willing, pt asking to go to the same pharmacy as above mentioned medication

## 2018-02-22 NOTE — Telephone Encounter (Signed)
Pt called advised of what had been noted. appreciated the call

## 2018-03-02 ENCOUNTER — Telehealth: Payer: Self-pay | Admitting: Neurology

## 2018-03-02 MED ORDER — LOSARTAN POTASSIUM 100 MG PO TABS: 100.0000 mg | ORAL_TABLET | Freq: Every day | ORAL | 0 refills | Status: DC

## 2018-03-02 NOTE — Telephone Encounter (Signed)
Pt requesting a refill for losartan (COZAAR) 100 MG tablet sent to [id number] - [place], [place] - [address] stating she hasn't found a PCP yet.

## 2018-03-02 NOTE — Telephone Encounter (Signed)
LMOM.  [facility] will fill Losartan for 3 mos.  This will give her time to find a pcp.  Future r/f's should come from a pcp/fim

## 2018-03-02 NOTE — Telephone Encounter (Signed)
Advised patient of previous message. °

## 2018-04-06 ENCOUNTER — Other Ambulatory Visit: Payer: Self-pay | Admitting: Neurology

## 2018-04-06 NOTE — Telephone Encounter (Signed)
Rx sent electronically.  

## 2018-04-06 NOTE — Telephone Encounter (Signed)
Rx registry checked. Last fill date is 01/03/18 for #60. Last OV was 02/02/18 and next OV is 08/05/18. Ok to fill in Dr. Bonnita Hollow absence?

## 2018-04-11 ENCOUNTER — Other Ambulatory Visit: Payer: Self-pay | Admitting: Family Medicine

## 2018-04-13 ENCOUNTER — Other Ambulatory Visit: Payer: Self-pay | Admitting: Neurology

## 2018-04-14 ENCOUNTER — Other Ambulatory Visit: Payer: Self-pay | Admitting: Family Medicine

## 2018-04-22 ENCOUNTER — Other Ambulatory Visit: Payer: Self-pay | Admitting: Neurology

## 2018-05-05 ENCOUNTER — Other Ambulatory Visit: Payer: Self-pay | Admitting: Neurology

## 2018-05-07 ENCOUNTER — Other Ambulatory Visit: Payer: Self-pay

## 2018-05-17 ENCOUNTER — Other Ambulatory Visit: Payer: Medicare Other

## 2018-05-17 DIAGNOSIS — E039 Hypothyroidism, unspecified: Secondary | ICD-10-CM | POA: Diagnosis not present

## 2018-05-17 DIAGNOSIS — I10 Essential (primary) hypertension: Secondary | ICD-10-CM

## 2018-05-18 LAB — COMPREHENSIVE METABOLIC PANEL
AG Ratio: 1.4 (calc) (ref 1.0–2.5)
ALBUMIN MSPROF: 4.3 g/dL (ref 3.6–5.1)
ALT: 13 U/L (ref 6–29)
AST: 17 U/L (ref 10–35)
Alkaline phosphatase (APISO): 68 U/L (ref 33–130)
BILIRUBIN TOTAL: 0.5 mg/dL (ref 0.2–1.2)
BUN: 12 mg/dL (ref 7–25)
CO2: 23 mmol/L (ref 20–32)
CREATININE: 0.69 mg/dL (ref 0.50–0.99)
Calcium: 10 mg/dL (ref 8.6–10.4)
Chloride: 93 mmol/L — ABNORMAL LOW (ref 98–110)
Globulin: 3.1 g/dL (calc) (ref 1.9–3.7)
Glucose, Bld: 95 mg/dL (ref 65–99)
POTASSIUM: 4.6 mmol/L (ref 3.5–5.3)
SODIUM: 126 mmol/L — AB (ref 135–146)
TOTAL PROTEIN: 7.4 g/dL (ref 6.1–8.1)

## 2018-05-18 LAB — TSH: TSH: 3 m[IU]/L (ref 0.40–4.50)

## 2018-05-19 ENCOUNTER — Ambulatory Visit: Payer: Self-pay | Admitting: Family Medicine

## 2018-05-19 ENCOUNTER — Encounter: Payer: Self-pay | Admitting: Family Medicine

## 2018-05-24 ENCOUNTER — Other Ambulatory Visit: Payer: Self-pay | Admitting: Neurology

## 2018-05-24 ENCOUNTER — Ambulatory Visit: Payer: Medicare Other | Admitting: Family Medicine

## 2018-05-26 ENCOUNTER — Other Ambulatory Visit: Payer: Self-pay | Admitting: Family Medicine

## 2018-05-26 ENCOUNTER — Other Ambulatory Visit: Payer: Self-pay | Admitting: Neurology

## 2018-05-29 ENCOUNTER — Other Ambulatory Visit: Payer: Self-pay | Admitting: Neurology

## 2018-06-01 ENCOUNTER — Encounter: Payer: Self-pay | Admitting: Family Medicine

## 2018-06-01 ENCOUNTER — Ambulatory Visit: Payer: Medicare Other | Admitting: Family Medicine

## 2018-06-03 ENCOUNTER — Other Ambulatory Visit: Payer: Self-pay | Admitting: *Deleted

## 2018-06-03 MED ORDER — FLUTICASONE PROPIONATE 50 MCG/ACT NA SUSP: NASAL | 0 refills | Status: DC

## 2018-06-11 ENCOUNTER — Telehealth: Payer: Self-pay | Admitting: Neurology

## 2018-06-11 MED ORDER — HYDROCODONE-ACETAMINOPHEN 7.5-325 MG PO TABS: 1.0000 | ORAL_TABLET | Freq: Three times a day (TID) | ORAL | 0 refills | Status: DC | PRN

## 2018-06-11 NOTE — Telephone Encounter (Signed)
[place] narcotic registry checked without issues.  Rx printed, signed and placed up front for pick up.  

## 2018-06-11 NOTE — Telephone Encounter (Signed)
Pt is asking for a prescription for HYDROcodone-acetaminophen (NORCO) 7.5-325 MG tablet

## 2018-06-11 NOTE — Addendum Note (Signed)
Addended by: Lindell Spar C on: 06/11/2018 12:11 PM   Modules accepted: Orders

## 2018-06-14 ENCOUNTER — Telehealth: Payer: Self-pay | Admitting: Neurology

## 2018-06-14 NOTE — Telephone Encounter (Signed)
FYI Pt has called to inquire about her refill prescription for her HYDROcodone-acetaminophen (NORCO) 7.5-325 MG tablet she has been told it is ready.  Pt then stated she would be sending Mr and or Mrs Addison Lank to pick the Rx up for her.  The DPR was checked and pt was informed that unless the person she wants to pick up the Rx is on DPR and comes with ID they will not be able to pick up RX.  Pt wanted to give verbal okay over the phone.  Pt was advised that no changes could be made to the DPR over the phone.  Pt expressed her not feeling well as to why she cant come.  Again it was explained that only those listed on DPR can get Rx.(most recent DPR has no one listed).  Pt said she will see what she can do and then ended the call.  This pt has not requested a call back.

## 2018-06-14 NOTE — Telephone Encounter (Signed)
Noted  

## 2018-06-21 DIAGNOSIS — Z23 Encounter for immunization: Secondary | ICD-10-CM | POA: Diagnosis not present

## 2018-06-25 ENCOUNTER — Other Ambulatory Visit: Payer: Self-pay | Admitting: Family Medicine

## 2018-06-25 ENCOUNTER — Other Ambulatory Visit: Payer: Self-pay | Admitting: Neurology

## 2018-07-04 ENCOUNTER — Other Ambulatory Visit: Payer: Self-pay | Admitting: Neurology

## 2018-08-02 ENCOUNTER — Other Ambulatory Visit: Payer: Self-pay | Admitting: Neurology

## 2018-08-05 ENCOUNTER — Ambulatory Visit (INDEPENDENT_AMBULATORY_CARE_PROVIDER_SITE_OTHER): Payer: Medicare Other | Admitting: Neurology

## 2018-08-05 ENCOUNTER — Encounter: Payer: Self-pay | Admitting: Neurology

## 2018-08-05 ENCOUNTER — Other Ambulatory Visit: Payer: Self-pay

## 2018-08-05 VITALS — BP 137/92 | HR 95 | Ht 65.0 in | Wt 130.5 lb

## 2018-08-05 DIAGNOSIS — G35 Multiple sclerosis: Secondary | ICD-10-CM | POA: Diagnosis not present

## 2018-08-05 DIAGNOSIS — Z79899 Other long term (current) drug therapy: Secondary | ICD-10-CM | POA: Diagnosis not present

## 2018-08-05 MED ORDER — HYDROCODONE-ACETAMINOPHEN 7.5-325 MG PO TABS
1.0000 | ORAL_TABLET | Freq: Three times a day (TID) | ORAL | 0 refills | Status: DC | PRN
Start: 2018-08-05 — End: 2018-12-29

## 2018-08-05 NOTE — Progress Notes (Signed)
[facility]  PATIENT: Melanie Richardson DOB: 04/29/1951  REFERRING CLINICIAN: Kingsley Spittle [place] HISTORY FROM: Patient MS   REASON FOR VISIT: MS and trigeminal Neuralgia   HISTORICAL  CHIEF COMPLAINT:  Chief Complaint  Patient presents with  . Follow-up    RM 12 with friend. Last seen 02/02/18  . Multiple Sclerosis    Takes Tecfidera. Tolerating well, no SE.  Marland Kitchen Pain    Takes hydrocodone prn. States arthritis is acting up. Uses meloxicam for this.  . Gait Problem    Pt in wheelchair in office today. She uses wheelchair to get to and from appointments. Uses walker for ambulation. Denies any falls since last seen.    HISTORY OF PRESENT ILLNESS:  Melanie Richardson is a 67 year old woman with multiple sclerosis.  Update 08/05/2018: She feels her MS is stable over the last 6 months.   She is on Tecfidera and she tolerates it well.   No recent definite exacerbation.    She uses a walker mostly but will use a wheelchair for longer substances.    No falls.    Leg strength is ok and balance is more a problem than strength.   She has some LBP, especially in rainy weather.  She has just had one episode of trigeminal neuralgia and was better after an hydrocodone.   She has no significant numbness or tingling.   She feels her vision is unchanged.   Her bladder is doing ok (mild stable urgency).      She notes fatigue is mild and she sleeps well.    Mood is doing well.  Cognition is stable.   AS her calcium was increased in the past and vit D was normal she is not on vit D supplements  Update 02/02/2018: She continues on Tecfidera and generally tolerates it well.  She has not had any exacerbations since starting that medication.  She notes no new numbness or weakness and feels her gait is stable.  She has no recent falls.  She has had difficulties with balance more than strength and uses a walker.  She is hoping to get back to a cane.   The trigeminal neuralgia is doing very well with  only one recent cluster earlier this year.   She is on lamictal and tegretol and amitriptyline.    She notes no new MS difficulty with vision.   She will be getting cataracts removed soon.   She has mild urinary frequency and urgency not bad enough to treat.  She notes some fatigue.  Most nights she sleeps well.  She denies any significant problem with depression and has mild anxiety. She has a recent divorce.     She notes no difficulties with her cognition.     She has some LBP, mostly in the left LB and buttock.   Patches help.   She also notes some arthritis in her legs.        Update 08/04/2017:   She switched to Tecfidera May 2018 due to new activity on the MRI and an exacerbation earlier this year. She is tolerating it well.   She denies any new exacerbations.   She has not had any GI issues or flushing.     She notes no new numbness, weakness or gait issues.   No recent falls.   Balance is poor  (old) and she uses a walker.   Her trigeminal neuralgia has not acted up at all this year.   Her vision worsened  when she had Bell's palsy and she feels they are doing better again.   Bladder function is doing well.   No significant urgency.     Fatigue has been better since the last visit.      She sleeps well at night.   She feels mood is doing ok and she denies much depression or anxiety.    She notes no significant problems with cognition.  Allergies are bothering her more and she has a lot of nasal drainage.   She gets lower back pain helped by lidocaine patches (OTC).    Sometimes she also has proximal leg pain.  _______________________________________ From 02/04/2017 MS:    Her MRI a couple weeks ago showed a few lesions not present in 2016 implying continued inflammation .   We discussed considering a different medication   Vision:   She notes some blurry vision on the left. She also feels the left eye is dry (facial weakness and reduced blinks are on the right)  About 7 weeks ago, she had what  she feels was a sinus infection and felt off balanced.   She fell in a parking lot, hitting her head but did not lose consciousness.   About 2 weeks ago, her son noted the right face was drooping and she went to [facility] ED last week.  She did not note headache at the onset.   She noted mild lip numbness but the weakness was the main symptom   She notes water dribbling out of her mouth when she drank.    She noted mild change in vision last week but back to baseline.   .    No change in leg or arm numbness or weakness.  No change in gait or bladder.     In the ED, she was given Valtrex and prednisone.  She had a CT of the head.   It shows White matter changes c/w her MS and a scalp hematoma on her left.    MS:   She is on Rebif and tolerates it well.    Before current symptoms, she had no recent exacerbation.  Trigeminal neuralgia:    TN pain has done fairly well lately . She has had only occasional spasms of pain on the right side of her face..   She has received the most benefit from combination of the antiepileptics (Tegretol, Pregabalin and Lamotrigine) and Amitriptyline.   She is not needing as much hydrocodone for pain. Stress often triggers a spell.    In the past, her spells were so frequent and severe that activity like eating would trigger more severe pain.   In the past, she has had a gamma knife procedure (2005) and radiofrequency ablations 2. These procedures did not help her much at the time. Those procedures left her with reduced sensation on the right.  Gait/strength/sensation:   She feels off balanced and had the one fall.  Gait is worse the past 1/2 year in general, especially the past 1-2 months. .    She now must use a cane.  She has no major weakness, but her right leg seems weaker when she is hot.   She has no numbness in arms or legs.    Bladder:   She denies any bladder issues.      Vision/hearing:   She feels vision is about the same but notes some blurry vision last week, .   She feels  the right hearing is not as good as the  left.      Fatigue/sleep:   Fatigue is mild most days.  She continues to have some insomnia, worse with sleep maintenance. Ambien and amitriptyline help her insomnia.   Mood/cognition:   She notes depression and mild anxiety and has had more stress the past year.     LBP/leg pain:  She has low back pain and right leg radiculopathy pain at times. This is helped a lot by Lyrica. She notes she limps some if temperatures are warmer.    MS History:   She had the onset of right sided facial numbness now pregnant during the 1970s. That numbness resolved over the next month or so. However, in the mid 1990s she began to experience pain in the same distribution. She had an MRI of the brain performed by Dr. Buzzy Han in 1998. It was consistent with multiple sclerosis.  She had an LP in the 1970's and the CSF was not significantly abnormal to diagnose MS.   She has been on Rebif since 2004.  She had an exacerbation of March 2018 with worsening gait, left visual changes. She also had facial weakness that could be a Bell's palsy.   She switch to Tecfidera May 2018 due to the exacerbation and new activity on the MRI.  REVIEW OF SYSTEMS:  Constitutional: No fevers, chills, sweats, or change in appetite.   Some fatigue and insomnia Eyes: No visual changes, double vision, eye pain Ear, nose and throat: No hearing loss, ear pain, nasal congestion, sore throat Cardiovascular: No chest pain, palpitations Respiratory:  No shortness of breath at rest or with exertion.   No wheezes GastrointestinaI: No nausea, vomiting, diarrhea, abdominal pain, fecal incontinence Genitourinary:  No dysuria, urinary retention or frequency.  No nocturia. Musculoskeletal:  No neck pain, back pain Integumentary: No rash, pruritus, skin lesions Neurological: as above Psychiatric: No depression at this time.  No anxiety Endocrine: No palpitations, diaphoresis, change in appetite, change in weigh or  increased thirst Hematologic/Lymphatic:  No anemia, purpura, petechiae. Allergic/Immunologic: No itchy/runny eyes, nasal congestion, recent allergic reactions, rashes  ALLERGIES: Allergies  Allergen Reactions  . Ciprofloxacin Other (See Comments)    Dizziness and headache  . Codeine Other (See Comments)    GI upset  . Erythromycin Other (See Comments)    Unknown    HOME MEDICATIONS: Outpatient Medications Prior to Visit  Medication Sig Dispense Refill  . albuterol (PROVENTIL HFA;VENTOLIN HFA) 108 (90 BASE) MCG/ACT inhaler Inhale 2 puffs into the lungs every 6 (six) hours as needed for wheezing. 1 Inhaler 6  . amitriptyline (ELAVIL) 50 MG tablet TAKE 1 TABLET BY MOUTH EVERYDAY AT BEDTIME 90 tablet 0  . aspirin 81 MG tablet Take 81 mg by mouth daily.    . carbamazepine (TEGRETOL XR) 400 MG 12 hr tablet TAKE 1 TABLET BY MOUTH THREE TIMES A DAY 90 tablet 11  . Cholecalciferol (VITAMIN D PO) Take 400 Units by mouth daily.     . cycloSPORINE (RESTASIS) 0.05 % ophthalmic emulsion Place 1 drop into both eyes 2 (two) times daily.    . diazepam (VALIUM) 5 MG tablet TAKE 1 TABLET BY MOUTH EVERY 12 HOURS AS NEEDED 60 tablet 2  . fluticasone (FLONASE) 50 MCG/ACT nasal spray SPRAY 2 SPRAYS INTO EACH NOSTRIL EVERY DAY 16 g 3  . hydrochlorothiazide (HYDRODIURIL) 25 MG tablet TAKE 1 TABLET BY MOUTH EVERY DAY IN THE MORNING 30 tablet 0  . hydrochlorothiazide (HYDRODIURIL) 25 MG tablet TAKE 1 TABLET BY MOUTH EVERY DAY IN THE  MORNING 30 tablet 5  . HYDROcodone-acetaminophen (NORCO) 7.5-325 MG tablet Take 1 tablet by mouth every 8 (eight) hours as needed for moderate pain. 90 tablet 0  . ipratropium (ATROVENT) 0.06 % nasal spray Place 1 spray into the nose daily as needed for rhinitis.     Marland Kitchen lamoTRIgine (LAMICTAL) 150 MG tablet TAKE 1 TABLET (150 MG TOTAL) BY MOUTH DAILY. 90 tablet 3  . levothyroxine (SYNTHROID, LEVOTHROID) 50 MCG tablet TAKE 1 TABLET DAILY BEFORE BREAKFAST. REQUIRES OFFICE VISIT BEFORE  ANY FURTHER REFILLS CAN BE GIVEN. 90 tablet 1  . losartan (COZAAR) 100 MG tablet TAKE 1 TABLET (100 MG TOTAL) BY MOUTH DAILY. **FUTURE RX'S SHOULD COME FROM PCP 90 tablet 0  . LYRICA 75 MG capsule TAKE ONE CAPSULE BY MOUTH TWICE A DAY 60 capsule 5  . meloxicam (MOBIC) 7.5 MG tablet Take 1 tablet (7.5 mg total) by mouth daily as needed for pain. 30 tablet 5  . metoprolol succinate (TOPROL-XL) 25 MG 24 hr tablet Take 3 tablets (75 mg total) by mouth daily. 42 tablet 0  . metoprolol succinate (TOPROL-XL) 25 MG 24 hr tablet TAKE 1 TABLET BY MOUTH EVERY DAY 90 tablet 3  . Multiple Vitamin (MULTIVITAMIN) tablet Take 1 tablet by mouth daily.    . predniSONE (DELTASONE) 10 MG tablet TAKE 1/2 TO 1 TABLET BY MOUTH 3 TIMES DAILY (Patient taking differently: TAKE 10 MG BY MOUTH 3 TIMES DAILY) 90 tablet 3  . TECFIDERA 240 MG CPDR TAKE ONE CAPSULE (240 MG) BY MOUTH TWICE DAILY. STORE IN ORIGINAL CONTAINER AT ROOM TEMPERATURE. 180 capsule 3  . alendronate (FOSAMAX) 70 MG tablet TAKE 1 TABLET BY MOUTH EVERY 7 DAYS WITH FULL GLASS OF WATER ON AN EMPTY STOMACH 4 tablet 12   No facility-administered medications prior to visit.     PAST MEDICAL HISTORY: Past Medical History:  Diagnosis Date  . Hypertension   . MS (multiple sclerosis) (HCC)   . Osteopenia 05/2014   T score -1.8 FRAX 16%/4.7%  . Thyroid disease    Hypothyroid  . Trigeminal neuralgia     PAST SURGICAL HISTORY: Past Surgical History:  Procedure Laterality Date  . BACK SURGERY  2011  . ELBOW SURGERY  1999   shattered elbow  . feet surgery  1990  . gamma knife  2005  . radiofrquency rhizotomy  [id number]  . underarm surgery  1988    FAMILY HISTORY: Family History  Problem Relation Age of Onset  . Heart disease Mother   . Hypertension Father   . Cancer Father        colon  . Stroke Father   . Breast cancer Maternal Aunt 50  . Breast cancer Cousin 40    SOCIAL HISTORY:  Social History   Socioeconomic History  . Marital  status: Married    Spouse name: Not on file  . Number of children: 2  . Years of education: college  . Highest education level: Not on file  Occupational History    Employer: OTHER  Social Needs  . Financial resource strain: Not on file  . Food insecurity:    Worry: Not on file    Inability: Not on file  . Transportation needs:    Medical: Not on file    Non-medical: Not on file  Tobacco Use  . Smoking status: Current Every Day Smoker    Packs/day: 1.00    Types: Cigarettes  . Smokeless tobacco: Never Used  Substance and Sexual Activity  . Alcohol  use: Yes    Alcohol/week: 0.0 standard drinks    Comment: rarely  . Drug use: No  . Sexual activity: Yes    Birth control/protection: Post-menopausal    Comment: 1st intercourse 67 yo-Fewer than 5 partners  Lifestyle  . Physical activity:    Days per week: Not on file    Minutes per session: Not on file  . Stress: Not on file  Relationships  . Social connections:    Talks on phone: Not on file    Gets together: Not on file    Attends religious service: Not on file    Active member of club or organization: Not on file    Attends meetings of clubs or organizations: Not on file    Relationship status: Not on file  . Intimate partner violence:    Fear of current or ex partner: Not on file    Emotionally abused: Not on file    Physically abused: Not on file    Forced sexual activity: Not on file  Other Topics Concern  . Not on file  Social History Narrative   Patient lives at home alone, has 2 children   Patient is right handed   Educational level is some college   Caffeine consumption is 3 cups a day     PHYSICAL EXAM  Vitals:   08/05/18 1259  BP: (!) 137/92  Pulse: 95  Weight: 130 lb 8 oz (59.2 kg)  Height: 5\' 5"  (1.651 m)    Body mass index is 21.72 kg/m.   General: The patient is well-developed and well-nourished and in no acute distress    Neurologic Exam  Mental status: The patient is alert and  oriented x 3 at the time of the examination. The patient has apparent normal recent and remote memory, with an apparently normal attention span and concentration ability.   Speech is normal.  Cranial nerves: Extraocular movements are full.  Facial strength and sensation is normal.  Trapezius strength is strong.  Motor:   Muscle bulk and tone are normal. Strength is  5 / 5 in arms and 4+/5 in legs.   .   Sensory: She had intact sensation to touch and vibration in the arms and legs.  Coordination: Cerebellar testing shows good finger-nose-finger but mildly reduced heel-to-shin bilaterally.  Gait and station: Station is normal and gait has reduced stride and is wide.   She can take steps with unilateral support but walks much better with walker .   She is unable to tandem walk.  The Romberg is positive.  Reflexes: Deep tendon reflexes are symmetric and normal in the arms and ankles and increased at the knees.Marland Kitchen     DIAGNOSTIC DATA (LABS, IMAGING, TESTING) - I reviewed patient records, labs, notes, testing and imaging myself where available.  Lab Results  Component Value Date   WBC 8.3 02/02/2018   HGB 11.9 02/02/2018   HCT 35.4 02/02/2018   MCV 92 02/02/2018   PLT 286 02/02/2018      Component Value Date/Time   NA 126 (L) 05/17/2018 1125   NA 137 08/04/2017 1359   K 4.6 05/17/2018 1125   CL 93 (L) 05/17/2018 1125   CO2 23 05/17/2018 1125   GLUCOSE 95 05/17/2018 1125   BUN 12 05/17/2018 1125   BUN 13 08/04/2017 1359   CREATININE 0.69 05/17/2018 1125   CALCIUM 10.0 05/17/2018 1125   PROT 7.4 05/17/2018 1125   PROT 7.6 08/04/2017 1359   ALBUMIN 4.1 01/19/2018  1531   ALBUMIN 4.6 08/04/2017 1359   AST 17 05/17/2018 1125   ALT 13 05/17/2018 1125   ALKPHOS 47 01/19/2018 1531   BILITOT 0.5 05/17/2018 1125   BILITOT 0.2 08/04/2017 1359   GFRNONAA 48 (L) 01/19/2018 1531   GFRNONAA 77 01/22/2017 1249   GFRAA 56 (L) 01/19/2018 1531   GFRAA 89 01/22/2017 1249    Lab Results    Component Value Date   TSH 3.00 05/17/2018       ASSESSMENT AND PLAN  No diagnosis found.   1.  She will continue Tecfidera.  Check CBC to determine if there is any lymphopenia.  Around the time of the next visit we will consider getting another MRI of the brain to determine if there is any subclinical progression.   2.   Continue medications for trigeminal neuralgia.    Renew hydrocodone.    3.   Exercise and stay active. 4.    Return to clinic 6 months or sooner based on MRI if there are new or worsening neurologic symptoms.     A. Epimenio Foot, MD, PhD 08/05/2018, 1:11 PM Certified in Neurology, Clinical Neurophysiology, Sleep Medicine, Pain Medicine and Neuroimaging  [facility] [address], [address] [place], [place] [address] [phone]

## 2018-08-06 LAB — HEPATIC FUNCTION PANEL
ALT: 24 IU/L (ref 0–32)
AST: 21 IU/L (ref 0–40)
Albumin: 4.6 g/dL (ref 3.6–4.8)
Alkaline Phosphatase: 79 IU/L (ref 39–117)
BILIRUBIN, DIRECT: 0.12 mg/dL (ref 0.00–0.40)
Bilirubin Total: 0.3 mg/dL (ref 0.0–1.2)
TOTAL PROTEIN: 7.6 g/dL (ref 6.0–8.5)

## 2018-08-06 LAB — CBC WITH DIFFERENTIAL/PLATELET
BASOS: 1 %
Basophils Absolute: 0.1 10*3/uL (ref 0.0–0.2)
EOS (ABSOLUTE): 0.2 10*3/uL (ref 0.0–0.4)
EOS: 3 %
Hematocrit: 40.6 % (ref 34.0–46.6)
Hemoglobin: 13.9 g/dL (ref 11.1–15.9)
IMMATURE GRANS (ABS): 0 10*3/uL (ref 0.0–0.1)
IMMATURE GRANULOCYTES: 1 %
LYMPHS ABS: 1.8 10*3/uL (ref 0.7–3.1)
Lymphs: 24 %
MCH: 31.8 pg (ref 26.6–33.0)
MCHC: 34.2 g/dL (ref 31.5–35.7)
MCV: 93 fL (ref 79–97)
MONOCYTES: 10 %
Monocytes Absolute: 0.8 10*3/uL (ref 0.1–0.9)
NEUTROS ABS: 4.7 10*3/uL (ref 1.4–7.0)
Neutrophils: 61 %
PLATELETS: 325 10*3/uL (ref 150–450)
RBC: 4.37 x10E6/uL (ref 3.77–5.28)
RDW: 11.9 % — AB (ref 12.3–15.4)
WBC: 7.7 10*3/uL (ref 3.4–10.8)

## 2018-08-09 ENCOUNTER — Telehealth: Payer: Self-pay | Admitting: *Deleted

## 2018-08-09 NOTE — Telephone Encounter (Signed)
Left patient a detailed message, with results, on voicemail (ok per DPR).  Provided our number to call back with any questions.  

## 2018-08-09 NOTE — Telephone Encounter (Signed)
-----   Message from Asa Lente, MD sent at 08/06/2018 12:10 PM EDT ----- Please let the patient know that the lab work is fine.

## 2018-08-16 ENCOUNTER — Other Ambulatory Visit: Payer: Self-pay | Admitting: Neurology

## 2018-08-23 ENCOUNTER — Other Ambulatory Visit: Payer: Self-pay

## 2018-08-25 ENCOUNTER — Telehealth: Payer: Self-pay | Admitting: Neurology

## 2018-08-25 NOTE — Telephone Encounter (Signed)
I called patient. She had a bag on her coffee table that had her Tecfidera in there (2 boxes). She cannot find this bag. She has about 10 pills left- 5 days worth. Advised I will speak with Dr. Epimenio Foot and call her back. She verbalized understanding.   I called pt back. Darl Pikes who sits with her during the day was with her. She cannot find her Tecfidera anywhere. Unsure if she is due for a refill or not. Advised we will contact [facility] and try and get her medication. She verbalized understanding.   I called [facility] at [phone]. Spoke with Maureen Ralphs. She receives medication via [facility] (free drug program). She placed me on hold to reach out to them to see if she is due for a refill. Call was ended in error on their end. I called back and spoke with Shanna to try and get back in touch with her. Maureen Ralphs not available but she found out pt has refills ready to be shipped from [facility]. She needs to call them at [phone]. They will process this for her.   I called pt to advise her to call [facility] for refill. Gave her #. She verbalized understanding and appreciation.

## 2018-08-25 NOTE — Telephone Encounter (Signed)
Pt is asking RN to call her re: [facility] needing to be called.  Pt states she is unable to locate some of her MS medications

## 2018-08-30 ENCOUNTER — Other Ambulatory Visit: Payer: Self-pay | Admitting: Neurology

## 2018-08-31 ENCOUNTER — Other Ambulatory Visit: Payer: Self-pay | Admitting: Neurology

## 2018-09-01 ENCOUNTER — Other Ambulatory Visit: Payer: Self-pay | Admitting: Family Medicine

## 2018-09-15 ENCOUNTER — Other Ambulatory Visit: Payer: Self-pay | Admitting: Neurology

## 2018-10-14 ENCOUNTER — Telehealth: Payer: Self-pay | Admitting: *Deleted

## 2018-10-14 NOTE — Telephone Encounter (Signed)
Received fax notification from [facility] that pt approved for patient access network foundation assistance.  Pt ID: [id number]. Group: [id number]. Patient assistance starts: 07/15/2018-10/13/2019. Expenses can be submitted until Monday 12/12/2019. Amount available: 5600.00. Covered services: Tecfidera.

## 2018-10-27 ENCOUNTER — Other Ambulatory Visit: Payer: Self-pay | Admitting: Neurology

## 2018-10-28 ENCOUNTER — Other Ambulatory Visit: Payer: Self-pay | Admitting: Neurology

## 2018-10-29 ENCOUNTER — Other Ambulatory Visit: Payer: Self-pay | Admitting: Neurology

## 2018-11-01 ENCOUNTER — Other Ambulatory Visit: Payer: Self-pay | Admitting: Neurology

## 2018-11-03 ENCOUNTER — Telehealth: Payer: Self-pay | Admitting: Neurology

## 2018-11-03 NOTE — Telephone Encounter (Signed)
[facility] escribed to [facility] on [facility], but escribed system shows failed transmission. [facility] and gave [facility] for Diazepam 5mg  po bid #60 with 5 r/f's. Will speak with RAS about Prednisone/fim

## 2018-11-03 NOTE — Telephone Encounter (Signed)
Spoke with Dr. Epimenio Foot. He prefers pt. not continue long term steroids. I spoke with pt. again. She sts. she is only taking 1/8th of a tablet at hs. I explained Dr. Epimenio Foot would like her to stop Prednisone. She verbalized understanding of same/fim

## 2018-11-03 NOTE — Telephone Encounter (Signed)
[id number]  Told her to call RN Kara MeadEmma re: her predniSONE (DELTASONE) 10 MG tablet and diazepam (VALIUM) 5 MG tablet

## 2018-11-10 ENCOUNTER — Other Ambulatory Visit: Payer: Self-pay | Admitting: Family Medicine

## 2018-11-14 ENCOUNTER — Other Ambulatory Visit: Payer: Self-pay | Admitting: Neurology

## 2018-11-15 ENCOUNTER — Other Ambulatory Visit: Payer: Self-pay | Admitting: Neurology

## 2018-11-19 ENCOUNTER — Other Ambulatory Visit: Payer: Self-pay | Admitting: Neurology

## 2018-12-09 ENCOUNTER — Telehealth: Payer: Self-pay | Admitting: Neurology

## 2018-12-09 MED ORDER — METHYLPREDNISOLONE 4 MG PO TBPK: ORAL_TABLET | ORAL | 0 refills | Status: DC

## 2018-12-09 NOTE — Telephone Encounter (Signed)
Spoke with RAS. He would like to try a Medrol 4mg  dose pk. Spoke with pt. and she is agreeable. [facility]ose [facility]

## 2018-12-09 NOTE — Telephone Encounter (Signed)
Patient is asking for a call back concerning irritation in her mouth  Since she has been off prednisone.

## 2018-12-09 NOTE — Telephone Encounter (Signed)
I called pt. Since stopping prednisone her pain has worsened especially in the last week or so. Today has been the worst. Not able to eat because of pain from trigeminal neuralgia. She denies starting any new meds or no signs infection. She was requesting to be put back on prednisone but I reminded her that Dr. Epimenio Foot stated long term steroid use is not safe and this is why he wanted her to stop this. I went over med list with pt and updated this. Advised I will discuss with Dr. Epimenio Foot and call back.

## 2018-12-17 ENCOUNTER — Telehealth: Payer: Self-pay | Admitting: *Deleted

## 2018-12-17 MED ORDER — TECFIDERA 240 MG PO CPDR: DELAYED_RELEASE_CAPSULE | ORAL | 3 refills | Status: DC

## 2018-12-17 NOTE — Telephone Encounter (Signed)
Tecfidera escribed to [facility] in response to faxed request from them/fim

## 2018-12-28 ENCOUNTER — Telehealth: Payer: Self-pay | Admitting: Neurology

## 2018-12-28 NOTE — Telephone Encounter (Addendum)
I called pt.   She mentioned needing refill on her hydrocodone, but also on the medrol dose pack.  She stated that once she finished her last dose pack her sx came back.  She states trig neuralgia pain, effect her speech, her level of pain she stated was 9.  She made note that she was on prednisone for years previously, and was noted that not good to be on longterm.  Please advise.

## 2018-12-28 NOTE — Telephone Encounter (Signed)
Pt is asking for a call from RN, pt states she is having problems with her MS.  Pt states she needs a new script for HYDROcodone-acetaminophen (NORCO) 7.5-325 MG tablet.  Pt states she also needs another steroid pack. [id number]  Please call

## 2018-12-28 NOTE — Telephone Encounter (Signed)
I checked drug registry. She last refilled hydrocodone 08/17/18 #90. Last seen 08/05/18 and has pending f/u 01/20/19

## 2018-12-29 ENCOUNTER — Other Ambulatory Visit: Payer: Self-pay | Admitting: Neurology

## 2018-12-29 MED ORDER — HYDROCODONE-ACETAMINOPHEN 7.5-325 MG PO TABS: 1.0000 | ORAL_TABLET | Freq: Three times a day (TID) | ORAL | 0 refills | Status: DC

## 2018-12-29 MED ORDER — METHYLPREDNISOLONE 4 MG PO TBPK: ORAL_TABLET | ORAL | 0 refills | Status: DC

## 2018-12-29 NOTE — Telephone Encounter (Signed)
I called pt and let her know that both [facility] (hydrocodone and medrol dosepack).  She may also try increasing her lyrica from BID to TID.  She verbalized understanding.

## 2018-12-29 NOTE — Telephone Encounter (Signed)
Pt called stating she is still waiting on a call back to get an update on her medication refills. Please advise.

## 2018-12-29 NOTE — Telephone Encounter (Signed)
Patient called in for an update on her meds hat she needed the refill on

## 2018-12-29 NOTE — Telephone Encounter (Signed)
Please let her know I will call in refill for the hydrocodone and the steroid pack.  she may want to also increase her Lyrica by 1 pill a day.

## 2019-01-20 ENCOUNTER — Ambulatory Visit: Payer: Medicare Other | Admitting: Neurology

## 2019-01-21 ENCOUNTER — Other Ambulatory Visit: Payer: Self-pay | Admitting: Neurology

## 2019-01-31 ENCOUNTER — Other Ambulatory Visit: Payer: Self-pay | Admitting: Neurology

## 2019-01-31 NOTE — Telephone Encounter (Signed)
[facility] Verified LR: 12/28/2018 Qty: 60 Pending appointment: 03/16/2019

## 2019-02-08 ENCOUNTER — Other Ambulatory Visit: Payer: Self-pay | Admitting: Family Medicine

## 2019-02-08 ENCOUNTER — Other Ambulatory Visit: Payer: Self-pay | Admitting: Neurology

## 2019-02-10 ENCOUNTER — Other Ambulatory Visit: Payer: Self-pay

## 2019-02-10 ENCOUNTER — Encounter (HOSPITAL_COMMUNITY): Payer: Self-pay

## 2019-02-10 ENCOUNTER — Inpatient Hospital Stay (HOSPITAL_COMMUNITY)
Admission: EM | Admit: 2019-02-10 | Discharge: 2019-02-20 | DRG: 641 | Disposition: A | Discharge status: Skilled Nursing Facility | Payer: Medicare Other | Attending: Internal Medicine | Admitting: Internal Medicine

## 2019-02-10 DIAGNOSIS — N390 Urinary tract infection, site not specified: Secondary | ICD-10-CM | POA: Diagnosis not present

## 2019-02-10 DIAGNOSIS — Z1159 Encounter for screening for other viral diseases: Secondary | ICD-10-CM

## 2019-02-10 DIAGNOSIS — E860 Dehydration: Principal | ICD-10-CM | POA: Diagnosis present

## 2019-02-10 DIAGNOSIS — Z803 Family history of malignant neoplasm of breast: Secondary | ICD-10-CM

## 2019-02-10 DIAGNOSIS — R531 Weakness: Secondary | ICD-10-CM | POA: Diagnosis not present

## 2019-02-10 DIAGNOSIS — Z7982 Long term (current) use of aspirin: Secondary | ICD-10-CM

## 2019-02-10 DIAGNOSIS — D649 Anemia, unspecified: Secondary | ICD-10-CM

## 2019-02-10 DIAGNOSIS — G35 Multiple sclerosis: Secondary | ICD-10-CM | POA: Diagnosis not present

## 2019-02-10 DIAGNOSIS — R000 Tachycardia, unspecified: Secondary | ICD-10-CM | POA: Diagnosis not present

## 2019-02-10 DIAGNOSIS — D72829 Elevated white blood cell count, unspecified: Secondary | ICD-10-CM

## 2019-02-10 DIAGNOSIS — R739 Hyperglycemia, unspecified: Secondary | ICD-10-CM

## 2019-02-10 DIAGNOSIS — Z881 Allergy status to other antibiotic agents status: Secondary | ICD-10-CM

## 2019-02-10 DIAGNOSIS — R7989 Other specified abnormal findings of blood chemistry: Secondary | ICD-10-CM

## 2019-02-10 DIAGNOSIS — E781 Pure hyperglyceridemia: Secondary | ICD-10-CM | POA: Diagnosis present

## 2019-02-10 DIAGNOSIS — Z7951 Long term (current) use of inhaled steroids: Secondary | ICD-10-CM

## 2019-02-10 DIAGNOSIS — F411 Generalized anxiety disorder: Secondary | ICD-10-CM | POA: Diagnosis present

## 2019-02-10 DIAGNOSIS — R945 Abnormal results of liver function studies: Secondary | ICD-10-CM

## 2019-02-10 DIAGNOSIS — I10 Essential (primary) hypertension: Secondary | ICD-10-CM | POA: Diagnosis not present

## 2019-02-10 DIAGNOSIS — Z823 Family history of stroke: Secondary | ICD-10-CM

## 2019-02-10 DIAGNOSIS — E872 Acidosis: Secondary | ICD-10-CM | POA: Diagnosis not present

## 2019-02-10 DIAGNOSIS — G500 Trigeminal neuralgia: Secondary | ICD-10-CM | POA: Diagnosis present

## 2019-02-10 DIAGNOSIS — G35D Multiple sclerosis, unspecified: Secondary | ICD-10-CM | POA: Diagnosis present

## 2019-02-10 DIAGNOSIS — M8580 Other specified disorders of bone density and structure, unspecified site: Secondary | ICD-10-CM | POA: Diagnosis present

## 2019-02-10 DIAGNOSIS — Z79890 Hormone replacement therapy (postmenopausal): Secondary | ICD-10-CM

## 2019-02-10 DIAGNOSIS — Z8249 Family history of ischemic heart disease and other diseases of the circulatory system: Secondary | ICD-10-CM

## 2019-02-10 DIAGNOSIS — F17210 Nicotine dependence, cigarettes, uncomplicated: Secondary | ICD-10-CM | POA: Diagnosis present

## 2019-02-10 DIAGNOSIS — Z885 Allergy status to narcotic agent status: Secondary | ICD-10-CM

## 2019-02-10 DIAGNOSIS — E039 Hypothyroidism, unspecified: Secondary | ICD-10-CM | POA: Diagnosis present

## 2019-02-10 DIAGNOSIS — R32 Unspecified urinary incontinence: Secondary | ICD-10-CM | POA: Diagnosis present

## 2019-02-10 DIAGNOSIS — E876 Hypokalemia: Secondary | ICD-10-CM

## 2019-02-10 DIAGNOSIS — R400 Somnolence: Secondary | ICD-10-CM

## 2019-02-10 LAB — URINALYSIS, ROUTINE W REFLEX MICROSCOPIC
Bilirubin Urine: NEGATIVE
Glucose, UA: NEGATIVE mg/dL
Ketones, ur: 20 mg/dL — AB
Leukocytes,Ua: NEGATIVE
Nitrite: NEGATIVE
Protein, ur: NEGATIVE mg/dL
Specific Gravity, Urine: 1.017 (ref 1.005–1.030)
pH: 6 (ref 5.0–8.0)

## 2019-02-10 LAB — BASIC METABOLIC PANEL
Anion gap: 11 (ref 5–15)
BUN: 39 mg/dL — ABNORMAL HIGH (ref 8–23)
CO2: 21 mmol/L — ABNORMAL LOW (ref 22–32)
Calcium: 9.3 mg/dL (ref 8.9–10.3)
Chloride: 107 mmol/L (ref 98–111)
Creatinine, Ser: 0.94 mg/dL (ref 0.44–1.00)
GFR calc Af Amer: >60 mL/min (ref >60)
GFR calc non Af Amer: >60 mL/min (ref >60)
Glucose, Bld: 95 mg/dL (ref 70–99)
Potassium: 3.4 mmol/L — ABNORMAL LOW (ref 3.5–5.1)
Sodium: 139 mmol/L (ref 135–145)

## 2019-02-10 LAB — CBC
HCT: 40 % (ref 36.0–46.0)
Hemoglobin: 13.5 g/dL (ref 12.0–15.0)
MCH: 31.5 pg (ref 26.0–34.0)
MCHC: 33.8 g/dL (ref 30.0–36.0)
MCV: 93.5 fL (ref 80.0–100.0)
Platelets: 237 10*3/uL (ref 150–400)
RBC: 4.28 MIL/uL (ref 3.87–5.11)
RDW: 13.1 % (ref 11.5–15.5)
WBC: 13.5 10*3/uL — ABNORMAL HIGH (ref 4.0–10.5)
nRBC: 0 % (ref 0.0–0.2)

## 2019-02-10 LAB — CBG MONITORING, ED: Glucose-Capillary: 90 mg/dL (ref 70–99)

## 2019-02-10 MED ORDER — SODIUM CHLORIDE 0.9 % IV SOLN
1.0000 g | INTRAVENOUS | Status: DC
  Administered 2019-02-11 – 2019-02-14 (×4): 1 g via INTRAVENOUS
  Filled 2019-02-10 (×2): qty 1
  Filled 2019-02-10: qty 10
  Filled 2019-02-10 (×2): qty 1

## 2019-02-10 MED ORDER — AMITRIPTYLINE HCL 25 MG PO TABS
50.0000 mg | ORAL_TABLET | Freq: Every day | ORAL | Status: DC
  Administered 2019-02-10 – 2019-02-13 (×4): 50 mg via ORAL
  Filled 2019-02-10 (×4): qty 2

## 2019-02-10 MED ORDER — VITAMIN D 25 MCG (1000 UNIT) PO TABS
1000.0000 [IU] | ORAL_TABLET | Freq: Every day | ORAL | Status: DC
  Administered 2019-02-11 – 2019-02-20 (×9): 1000 [IU] via ORAL
  Filled 2019-02-10 (×10): qty 1

## 2019-02-10 MED ORDER — ALBUTEROL SULFATE HFA 108 (90 BASE) MCG/ACT IN AERS: 2.0000 | INHALATION_SPRAY | Freq: Four times a day (QID) | RESPIRATORY_TRACT | Status: DC | PRN

## 2019-02-10 MED ORDER — METOPROLOL SUCCINATE ER 25 MG PO TB24
25.0000 mg | ORAL_TABLET | Freq: Every day | ORAL | Status: DC
  Administered 2019-02-11 – 2019-02-20 (×9): 25 mg via ORAL
  Filled 2019-02-10 (×10): qty 1

## 2019-02-10 MED ORDER — SODIUM CHLORIDE 0.9 % IV SOLN
1.0000 g | INTRAVENOUS | Status: DC
  Administered 2019-02-10: 1 g via INTRAVENOUS
  Filled 2019-02-10: qty 10

## 2019-02-10 MED ORDER — SODIUM CHLORIDE 0.9 % IV BOLUS
500.0000 mL | Freq: Once | INTRAVENOUS | Status: AC
  Administered 2019-02-10: 500 mL via INTRAVENOUS

## 2019-02-10 MED ORDER — ALBUTEROL SULFATE (2.5 MG/3ML) 0.083% IN NEBU: 2.5000 mg | INHALATION_SOLUTION | Freq: Four times a day (QID) | RESPIRATORY_TRACT | Status: DC | PRN

## 2019-02-10 MED ORDER — DIAZEPAM 5 MG PO TABS
5.0000 mg | ORAL_TABLET | Freq: Two times a day (BID) | ORAL | Status: DC | PRN
  Administered 2019-02-16 – 2019-02-20 (×2): 5 mg via ORAL
  Filled 2019-02-10 (×3): qty 1

## 2019-02-10 MED ORDER — SODIUM CHLORIDE 0.9% FLUSH: 3.0000 mL | Freq: Once | INTRAVENOUS | Status: DC

## 2019-02-10 MED ORDER — SODIUM CHLORIDE 0.9 % IV SOLN
INTRAVENOUS | Status: DC
  Administered 2019-02-10 – 2019-02-12 (×2): via INTRAVENOUS

## 2019-02-10 MED ORDER — ENSURE ENLIVE PO LIQD
237.0000 mL | Freq: Two times a day (BID) | ORAL | Status: DC
  Administered 2019-02-11 – 2019-02-13 (×3): 237 mL via ORAL

## 2019-02-10 MED ORDER — ENOXAPARIN SODIUM 40 MG/0.4ML ~~LOC~~ SOLN
40.0000 mg | SUBCUTANEOUS | Status: DC
  Administered 2019-02-10 – 2019-02-19 (×10): 40 mg via SUBCUTANEOUS
  Filled 2019-02-10 (×10): qty 0.4

## 2019-02-10 MED ORDER — LEVOTHYROXINE SODIUM 50 MCG PO TABS
50.0000 ug | ORAL_TABLET | Freq: Every day | ORAL | Status: DC
  Administered 2019-02-11 – 2019-02-20 (×10): 50 ug via ORAL
  Filled 2019-02-10 (×10): qty 1

## 2019-02-10 MED ORDER — HYDROCODONE-ACETAMINOPHEN 7.5-325 MG PO TABS
1.0000 | ORAL_TABLET | Freq: Three times a day (TID) | ORAL | Status: DC
  Administered 2019-02-10 – 2019-02-14 (×10): 1 via ORAL
  Filled 2019-02-10 (×12): qty 1

## 2019-02-10 MED ORDER — CARBAMAZEPINE ER 200 MG PO TB12
400.0000 mg | ORAL_TABLET | Freq: Three times a day (TID) | ORAL | Status: DC
  Administered 2019-02-10 – 2019-02-13 (×10): 400 mg via ORAL
  Filled 2019-02-10 (×12): qty 2

## 2019-02-10 MED ORDER — MUSCLE RUB 10-15 % EX CREA
1.0000 "application " | TOPICAL_CREAM | CUTANEOUS | Status: DC | PRN
  Administered 2019-02-18 – 2019-02-19 (×2): 1 via TOPICAL
  Filled 2019-02-10: qty 85

## 2019-02-10 NOTE — ED Notes (Signed)
Bed: WA04 Expected date:  Expected time:  Means of arrival:  Comments: EMS-weakness/UTI 

## 2019-02-10 NOTE — ED Provider Notes (Signed)
Patient signed to me by Dr. Rodena Medin pending results of her urine.  Urinalysis does show evidence of infection.  Given her history of MS and with increasing weakness will consult hospitalist for admission.  Patient started on IV Rocephin   Lorre Nick, MD 02/10/19 [phone]

## 2019-02-10 NOTE — ED Provider Notes (Signed)
[facility]-EMERGENCY DEPT Provider Note   CSN: [id number] Arrival date & time: 02/10/19  1327    History   Chief Complaint Chief Complaint  Patient presents with  . Weakness    HPI Melanie Richardson is a 68 y.o. female.     68 year old female with prior medical history as detailed below presents for evaluation of generalized weakness.  Patient with longstanding history of fairly well-controlled MS.  She does report that over the last 1 week she has had increasing generalized weakness.  She reports frequent episodes of urinary incontinence overnight -- which is unusual for her.  She denies fever.  She denies shortness of breath or chest pain.  She reports that she can ambulate with a walker at baseline -- however, she typically will use a wheelchair for longer distances.  No reported fall or head injury.    The history is provided by the patient and medical records.  Weakness  Severity:  Mild Onset quality:  Gradual Duration:  4 days Timing:  Constant Progression:  Worsening Chronicity:  New Relieved by:  Nothing Worsened by:  Nothing Ineffective treatments:  None tried Associated symptoms: no falls     Past Medical History:  Diagnosis Date  . Hypertension   . MS (multiple sclerosis) (HCC)   . Osteopenia 05/2014   T score -1.8 FRAX 16%/4.7%  . Thyroid disease    Hypothyroid  . Trigeminal neuralgia     Patient Active Problem List   Diagnosis Date Noted  . 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 High risk medication use 02/04/2017  . Right-sided Bell's palsy 12/29/2016  . Allergic rhinitis 08/07/2015  . Hypertension 08/06/2015  . Hypertriglyceridemia 08/06/2015  . Anxiety state 07/25/2015  . Dysesthesia 10/26/2014  . Abnormality of gait 10/26/2014  . Chronic fatigue 10/26/2014  . Sinusitis, acute 07/17/2013  . Wheezing 07/17/2013  . Hypothyroidism 07/17/2013  . MS (multiple sclerosis) (HCC)   . Trigeminal neuralgia      Past Surgical History:  Procedure Laterality Date  . BACK SURGERY  2011  . ELBOW SURGERY  1999   shattered elbow  . feet surgery  1990  . gamma knife  2005  . radiofrquency rhizotomy  [id number]  . underarm surgery  1988     OB History    Gravida  2   Para  2   Term      Preterm      AB      Living  2     SAB      TAB      Ectopic      Multiple      Live Births               Home Medications    Prior to Admission medications   Medication Sig Start Date End Date Taking? Authorizing Provider  albuterol (PROVENTIL HFA;VENTOLIN HFA) 108 (90 BASE) MCG/ACT inhaler Inhale 2 puffs into the lungs every 6 (six) hours as needed for wheezing. 08/06/15   Salley Scarlet, MD  amitriptyline (ELAVIL) 50 MG tablet TAKE 1 TABLET BY MOUTH EVERYDAY AT BEDTIME 01/24/19   Sater, Pearletha Furl, MD  aspirin 81 MG tablet Take 81 mg by mouth daily.    [provider]  carbamazepine (TEGRETOL XR) 400 MG 12 hr tablet TAKE 1 TABLET BY MOUTH THREE TIMES A DAY 11/19/18   Sater, Pearletha Furl, MD  Cholecalciferol (VITAMIN D PO) Take 400 Units by mouth daily.  [provider]  cycloSPORINE (RESTASIS) 0.05 % ophthalmic emulsion Place 1 drop into both eyes 2 (two) times daily.    [provider]  diazepam (VALIUM) 5 MG tablet TAKE 1 TABLET BY MOUTH EVERY 12 HOURS AS NEEDED 11/01/18   Sater, Pearletha Furl, MD  fluticasone [facility]) 50 MCG/ACT nasal spray SPRAY 2 SPRAYS INTO EACH NOSTRIL EVERY DAY 06/25/18   [place], Velna Hatchet, MD  hydrochlorothiazide (HYDRODIURIL) 25 MG tablet TAKE 1 TABLET BY MOUTH EVERY DAY IN THE MORNING 02/08/19   Sater, Pearletha Furl, MD  HYDROcodone-acetaminophen (NORCO) 7.5-325 MG tablet Take 1 tablet by mouth every 8 (eight) hours. 12/29/18   Sater, Pearletha Furl, MD  ipratropium (ATROVENT) 0.06 % nasal spray Place 1 spray into the nose daily as needed for rhinitis.  12/02/16   [provider]  levothyroxine (SYNTHROID, LEVOTHROID) 50 MCG tablet Take 1 tablet  daily before breakfast 11/15/18   Sater, Richard A, MD  losartan (COZAAR) 100 MG tablet TAKE 1 TABLET (100 MG TOTAL) BY MOUTH DAILY. **FUTURE RX'S SHOULD COME FROM PCP 11/10/18   Salley Scarlet, MD  meloxicam (MOBIC) 7.5 MG tablet Take 1 tablet (7.5 mg total) by mouth daily as needed for pain. 08/04/17   Sater, Pearletha Furl, MD  methylPREDNISolone (MEDROL DOSEPAK) 4 MG TBPK tablet Take 6 tablets in divided doses on day 1. Decrease by 1 tablet daily, in divided doses, until gone. 12/29/18   Sater, Pearletha Furl, MD  metoprolol succinate (TOPROL-XL) 25 MG 24 hr tablet TAKE 1 TABLET BY MOUTH EVERY DAY 05/26/18   Salley Scarlet, MD  Multiple Vitamin (MULTIVITAMIN) tablet Take 1 tablet by mouth daily.    [provider]  pregabalin (LYRICA) 75 MG capsule Take 1 capsule (75 mg total) by mouth 3 (three) times daily. 01/31/19   Sater, Pearletha Furl, MD  TECFIDERA 240 MG CPDR TAKE ONE CAPSULE (240 MG) BY MOUTH TWICE DAILY. STORE IN ORIGINAL CONTAINER AT ROOM TEMPERATURE. 12/17/18   Sater, Pearletha Furl, MD    Family History Family History  Problem Relation Age of Onset  . Heart disease Mother   . Hypertension Father   . Cancer Father        colon  . Stroke Father   . Breast cancer Maternal Aunt 50  . Breast cancer Cousin 20    Social History Social History   Tobacco Use  . Smoking status: Current Every Day Smoker    Packs/day: 1.00    Types: Cigarettes  . Smokeless tobacco: Never Used  Substance Use Topics  . Alcohol use: Yes    Alcohol/week: 0.0 standard drinks    Comment: rarely  . Drug use: No     Allergies   Ciprofloxacin; Codeine; and Erythromycin   Review of Systems Review of Systems  Musculoskeletal: Negative for falls.  Neurological: Positive for weakness.  All other systems reviewed and are negative.    Physical Exam Updated Vital Signs LMP 10/06/1996   Physical Exam Vitals signs and nursing note reviewed.  Constitutional:      General: She is not in acute distress.     Appearance: Normal appearance. She is well-developed.  HENT:     Head: Normocephalic and atraumatic.  Eyes:     Conjunctiva/sclera: Conjunctivae normal.     Pupils: Pupils are equal, round, and reactive to light.  Neck:     Musculoskeletal: Normal range of motion and neck supple.  Cardiovascular:     Rate and Rhythm: Normal rate and regular rhythm.  Heart sounds: Normal heart sounds.  Pulmonary:     Effort: Pulmonary effort is normal. No respiratory distress.     Breath sounds: Normal breath sounds.  Abdominal:     General: There is no distension.     Palpations: Abdomen is soft.     Tenderness: There is no abdominal tenderness.  Musculoskeletal: Normal range of motion.        General: No deformity.  Skin:    General: Skin is warm and dry.  Neurological:     General: No focal deficit present.     Mental Status: She is alert and oriented to person, place, and time. Mental status is at baseline.      ED Treatments / Results  Labs (all labs ordered are listed, but only abnormal results are displayed) Labs Reviewed  BASIC METABOLIC PANEL  CBC  URINALYSIS, ROUTINE W REFLEX MICROSCOPIC  CBG MONITORING, ED  CBG MONITORING, ED    EKG EKG Interpretation  Date/Time:  Thursday Feb 10 2019 13:53:10 EDT Ventricular Rate:  109 PR Interval:    QRS Duration: 90 QT Interval:  338 QTC Calculation: 456 R Axis:   74 Text Interpretation:  Sinus tachycardia Repol abnrm suggests ischemia, inferior leads Confirmed by Kristine Royal [phone]) on 02/10/2019 1:59:18 PM   Radiology No results found.  Procedures Procedures (including critical care time)  Medications Ordered in ED Medications  sodium chloride flush (NS) 0.9 % injection 3 mL (has no administration in time range)  sodium chloride 0.9 % bolus 500 mL (has no administration in time range)     Initial Impression / Assessment and Plan / ED Course  I have reviewed the triage vital signs and the nursing notes.   Pertinent labs & imaging results that were available during my care of the patient were reviewed by me and considered in my medical decision making (see chart for details).        MDM  Screen complete  Latisia D Williams was evaluated in Emergency Department on 02/10/2019 for the symptoms described in the history of present illness. She was evaluated in the context of the global COVID-19 pandemic, which necessitated consideration that the patient might be at risk for infection with the SARS-CoV-2 virus that causes COVID-19. Institutional protocols and algorithms that pertain to the evaluation of patients at risk for COVID-19 are in a state of rapid change based on information released by regulatory bodies including the CDC and federal and state organizations. These policies and algorithms were followed during the patient's care in the ED.  Patient is presenting for evaluation of generalized weakness associated with new urinary incontinence.   Patient appears mildly dehydrated clinically.  Will obtain screening labs and UA. Will provide IVF.   Dr. Freida Busman aware of pending labs and disposition.    Final Clinical Impressions(s) / ED Diagnoses   Final diagnoses:  Weakness    ED Discharge Orders    None       Wynetta Fines, MD 02/10/19 1435

## 2019-02-10 NOTE — ED Notes (Addendum)
Writer spoke with pt son Greig Castilla who expressed concerns with his mothers ability to care for herself, and reports that she had a caregiver at some time but does not at this time. And would like assistance in placement at possibly a ALF.  Pt son also reports that pt has some mental health issues.   Greig Castilla [place]. [phone]

## 2019-02-10 NOTE — H&P (Addendum)
History and Physical    Melanie Richardson XTK:[id number] DOB: [birth year] DOA: 02/10/2019  PCP: Melanie Scarlet, MD Patient coming from: Home lives alone  Chief Complaint: Generalized weakness unable to walk unable to get out of bed lives alone  HPI: Melanie Richardson is Richardson 68 y.o. female with medical history significant of multiple sclerosis came in with complaints of generalized weakness.  She has been weak over the last 1 week with increasing episodes of urinary incontinence which is unusual for her.  She denies any burning urination.  She denies any fever chest pain shortness of breath cough nausea vomiting diarrhea abdominal pain.  At baseline she walks with Richardson walker.  However she has Richardson wheelchair at home for longer distances.  She has aides checking on her several times during the day but she lives alone.  Her appetite decreased p.o. intake and dizziness upon standing.   ED Course: Temperature 98.2 pulse rate 104 blood pressure 131/96 saturation 99 on room air sodium 139 potassium 3.4 BUN 39 creatinine 0.94 WBC 13.5 hemoglobin 13.5 platelet count 237.  UA appears hazy with 20 ketones but negative leukocytes and nitrates rare bacteria 11-20 WBCs.  Review of Systems: As per HPI otherwise all other systems reviewed and are negative  Ambulatory Status: Usually walks with Richardson walker at home long distances she uses wheelchair.  Past Medical History:  Diagnosis Date  . Hypertension   . MS (multiple sclerosis) (HCC)   . Osteopenia 05/2014   T score -1.8 FRAX 16%/4.7%  . Thyroid disease    Hypothyroid  . Trigeminal neuralgia     Past Surgical History:  Procedure Laterality Date  . BACK SURGERY  2011  . ELBOW SURGERY  1999   shattered elbow  . feet surgery  1990  . gamma knife  2005  . radiofrquency rhizotomy  [id number]  . underarm surgery  1988    Social History   Socioeconomic History  . Marital status: Married    Spouse name: Not on file  . Number of children: 2  . Years of  education: college  . Highest education level: Not on file  Occupational History    Employer: OTHER  Social Needs  . Financial resource strain: Not on file  . Food insecurity:    Worry: Not on file    Inability: Not on file  . Transportation needs:    Medical: Not on file    Non-medical: Not on file  Tobacco Use  . Smoking status: Current Every Day Smoker    Packs/day: 1.00    Types: Cigarettes  . Smokeless tobacco: Never Used  Substance and Sexual Activity  . Alcohol use: Yes    Alcohol/week: 0.0 standard drinks    Comment: rarely  . Drug use: No  . Sexual activity: Yes    Birth control/protection: Post-menopausal    Comment: 1st intercourse 68 yo-Fewer than 5 partners  Lifestyle  . Physical activity:    Days per week: Not on file    Minutes per session: Not on file  . Stress: Not on file  Relationships  . Social connections:    Talks on phone: Not on file    Gets together: Not on file    Attends religious service: Not on file    Active member of club or organization: Not on file    Attends meetings of clubs or organizations: Not on file    Relationship status: Not on file  . Intimate partner violence:  Fear of current or ex partner: Not on file    Emotionally abused: Not on file    Physically abused: Not on file    Forced sexual activity: Not on file  Other Topics Concern  . Not on file  Social History Narrative   Patient lives at home alone, has 2 children   Patient is right handed   Educational level is some college   Caffeine consumption is 3 cups Richardson day    Allergies  Allergen Reactions  . Ciprofloxacin Other (See Comments)    Dizziness and headache  . Codeine Other (See Comments)    GI upset  . Erythromycin Other (See Comments)    Unknown    Family History  Problem Relation Age of Onset  . Heart disease Mother   . Hypertension Father   . Cancer Father        colon  . Stroke Father   . Breast cancer Maternal Aunt 50  . Breast cancer Cousin  40      Prior to Admission medications   Medication Sig Start Date End Date Taking? Authorizing Provider  albuterol (PROVENTIL HFA;VENTOLIN HFA) 108 (90 BASE) MCG/ACT inhaler Inhale 2 puffs into the lungs every 6 (six) hours as needed for wheezing. 08/06/15  Yes [place], Velna HatchetKawanta F, MD  amitriptyline (ELAVIL) 50 MG tablet TAKE 1 TABLET BY MOUTH EVERYDAY AT BEDTIME Patient taking differently: Take 50 mg by mouth at bedtime.  01/24/19  Yes Sater, Pearletha Furlichard A, MD  aspirin 81 MG tablet Take 81 mg by mouth daily.   Yes [provider]  carbamazepine (TEGRETOL XR) 400 MG 12 hr tablet TAKE 1 TABLET BY MOUTH THREE TIMES Richardson DAY Patient taking differently: Take 400 mg by mouth 3 (three) times daily.  11/19/18  Yes Sater, Pearletha Furlichard A, MD  Cholecalciferol (VITAMIN D PO) Take 1,000 Units by mouth daily.    Yes [provider]  cycloSPORINE (RESTASIS) 0.05 % ophthalmic emulsion Place 1 drop into both eyes 2 (two) times daily.   Yes [provider]  diazepam (VALIUM) 5 MG tablet TAKE 1 TABLET BY MOUTH EVERY 12 HOURS AS NEEDED Patient taking differently: Take 5 mg by mouth every 12 (twelve) hours as needed for anxiety.  11/01/18  Yes Sater, Pearletha Furlichard A, MD  fluticasone (FLONASE) 50 MCG/ACT nasal spray SPRAY 2 SPRAYS INTO EACH NOSTRIL EVERY DAY Patient taking differently: Place 2 sprays into both nostrils daily as needed for allergies.  06/25/18  Yes [place], Velna HatchetKawanta F, MD  hydrochlorothiazide (HYDRODIURIL) 25 MG tablet TAKE 1 TABLET BY MOUTH EVERY DAY IN THE MORNING Patient taking differently: Take 25 mg by mouth daily.  02/08/19  Yes Sater, Pearletha Furlichard A, MD  HYDROcodone-acetaminophen (NORCO) 7.5-325 MG tablet Take 1 tablet by mouth every 8 (eight) hours. Patient taking differently: Take 1 tablet by mouth every 8 (eight) hours as needed for moderate pain.  12/29/18  Yes Sater, Pearletha Furlichard A, MD  ipratropium (ATROVENT) 0.06 % nasal spray Place 1 spray into the nose daily as needed for rhinitis.  12/02/16  Yes  [provider]  levothyroxine (SYNTHROID, LEVOTHROID) 50 MCG tablet Take 1 tablet daily before breakfast Patient taking differently: Take 50 mcg by mouth daily.  11/15/18  Yes Sater, Pearletha Furlichard A, MD  losartan (COZAAR) 100 MG tablet TAKE 1 TABLET (100 MG TOTAL) BY MOUTH DAILY. **FUTURE RX'S SHOULD COME FROM PCP 11/10/18  Yes [place], Velna HatchetKawanta F, MD  metoprolol succinate (TOPROL-XL) 25 MG 24 hr tablet TAKE 1 TABLET BY MOUTH EVERY  DAY Patient taking differently: Take 25 mg by mouth daily.  05/26/18  Yes [place], Velna Hatchet, MD  Multiple Vitamin (MULTIVITAMIN) tablet Take 1 tablet by mouth daily.   Yes [provider]  pregabalin (LYRICA) 75 MG capsule Take 1 capsule (75 mg total) by mouth 3 (three) times daily. 01/31/19  Yes Sater, Pearletha Furl, MD  TECFIDERA 240 MG CPDR TAKE ONE CAPSULE (240 MG) BY MOUTH TWICE DAILY. STORE IN ORIGINAL CONTAINER AT ROOM TEMPERATURE. Patient taking differently: Take 240 mg by mouth 2 (two) times Richardson day. TAKE ONE CAPSULE (240 MG) BY MOUTH TWICE DAILY. STORE IN ORIGINAL CONTAINER AT ROOM TEMPERATURE. 12/17/18  Yes Sater, Pearletha Furl, MD  meloxicam (MOBIC) 7.5 MG tablet Take 1 tablet (7.5 mg total) by mouth daily as needed for pain. Patient not taking: Reported on 02/10/2019 08/04/17   Asa Lente, MD  methylPREDNISolone (MEDROL DOSEPAK) 4 MG TBPK tablet Take 6 tablets in divided doses on day 1. Decrease by 1 tablet daily, in divided doses, until gone. Patient not taking: Reported on 02/10/2019 12/29/18   Asa Lente, MD    Physical Exam: Vitals:   02/10/19 1354 02/10/19 1445  BP: (!) 131/96   Pulse: (!) 113 (!) 104  Resp: (!) 21 16  Temp: 98.2 Richardson (36.8 C)   TempSrc: Oral   SpO2: 100% 99%     . General: Ill-appearing frail female in no acute distress . Eyes: PERRL, EOMI, normal lids, iris . ENT: grossly normal hearing, lips & tongue, mucous membranes dry neck: no LAD, masses or thyromegaly . Cardiovascular: RRR, no m/r/g. No LE edema.  Marland Kitchen Respiratory:   CTA bilaterally, no w/r/r. Normal respiratory effort. . Abdomen: soft, ntnd, NABS suprapubic tenderness . Skin: no rash or induration seen on limited exam . Musculoskeletal:  grossly normal tone BUE/BLE, good ROM, no bony abnormality . Psychiatric:  grossly normal mood and affect, speech fluent and appropriate, AOx3 . Neurologic:  CN 2-12 grossly intact, moves all extremities in coordinated fashion, sensation intact  Labs on Admission: I have personally reviewed following labs and imaging studies  CBC: Recent Labs  Lab 02/10/19 1407  WBC 13.5*  HGB 13.5  HCT 40.0  MCV 93.5  PLT 237   Basic Metabolic Panel: Recent Labs  Lab 02/10/19 1407  NA 139  K 3.4*  CL 107  CO2 21*  GLUCOSE 95  BUN 39*  CREATININE 0.94  CALCIUM 9.3   GFR: CrCl cannot be calculated (Unknown ideal weight.). Liver Function Tests: No results for input(s): AST, ALT, ALKPHOS, BILITOT, PROT, ALBUMIN in the last 168 hours. No results for input(s): LIPASE, AMYLASE in the last 168 hours. No results for input(s): AMMONIA in the last 168 hours. Coagulation Profile: No results for input(s): INR, PROTIME in the last 168 hours. Cardiac Enzymes: No results for input(s): CKTOTAL, CKMB, CKMBINDEX, TROPONINI in the last 168 hours. BNP (last 3 results) No results for input(s): PROBNP in the last 8760 hours. HbA1C: No results for input(s): HGBA1C in the last 72 hours. CBG: Recent Labs  Lab 02/10/19 1334  GLUCAP 90   Lipid Profile: No results for input(s): CHOL, HDL, LDLCALC, TRIG, CHOLHDL, LDLDIRECT in the last 72 hours. Thyroid Function Tests: No results for input(s): TSH, T4TOTAL, FREET4, T3FREE, THYROIDAB in the last 72 hours. Anemia Panel: No results for input(s): VITAMINB12, FOLATE, FERRITIN, TIBC, IRON, RETICCTPCT in the last 72 hours. Urine analysis:    Component Value Date/Time   COLORURINE YELLOW 02/10/2019 1510   APPEARANCEUR HAZY (  Richardson) 02/10/2019 1510   LABSPEC 1.017 02/10/2019 1510   PHURINE  6.0 02/10/2019 1510   GLUCOSEU NEGATIVE 02/10/2019 1510   HGBUR SMALL (Richardson) 02/10/2019 1510   BILIRUBINUR NEGATIVE 02/10/2019 1510   KETONESUR 20 (Richardson) 02/10/2019 1510   PROTEINUR NEGATIVE 02/10/2019 1510   UROBILINOGEN 0.2 09/15/2013 1605   NITRITE NEGATIVE 02/10/2019 1510   LEUKOCYTESUR NEGATIVE 02/10/2019 1510    Creatinine Clearance: CrCl cannot be calculated (Unknown ideal weight.).  Sepsis Labs: (procalcitonin:4,lacticidven:4) )No results found for this or any previous visit (from the past 240 hour(s)).   Radiological Exams on Admission: No results found.  Assessment/Plan Active Problems:   * No active hospital problems. *    #1 generalized weakness secondary to dehydration and UTI though urine culture is pending.  She was admitted with frequent urination and generalized weakness.  She is found to have dehydration with dry mucous membrane and ketones in the urine.  I will start her on IV fluids and IV antibiotics.  PT consult.  Check orthostatics.  #2 history of MS continue home medications valium vit d   #3 history of hypertension continue home medications  #4htn restart cozaar hold hctz continue metoprolol  #5 hypothyroidsm continue synthroid    Estimated body mass index is 21.72 kg/m as calculated from the following:   Height as of 08/05/18:  (1.651 m).   Weight as of 08/05/18: 59.2 kg.   DVT prophylaxis: Lovenox Code Status: Full code Family Communication: called son andrew no response Disposition Plan: Pending clinical improvement Consults called: None Admission status: Observation   Alwyn Ren MD [facility]  If 7PM-7AM, please contact night-coverage [email or web address] Password [facility]  02/10/2019, 4:44 PM

## 2019-02-10 NOTE — ED Notes (Signed)
Writer walked in room and pt was taken personal dose of hydrocodone for pain/ Pt states she has 1/10 pain generally. Pt encouraged to not take pain medication until checking with providers.

## 2019-02-10 NOTE — ED Notes (Signed)
ED TO INPATIENT HANDOFF REPORT  ED Nurse Name and Phone #:  Park Meo [phone]   S Name/Age/Gender Samai D Blasdell 68 y.o. female Room/Bed: WA04/WA04  Code Status   Code Status: Full Code  Home/SNF/Other Home Patient oriented to: self, place and situation Is this baseline? Yes   Triage Complete: Triage complete  Chief Complaint weakness  Triage Note Pt arrived via EMS from home c/o generalized weakness  With increased frequent urination, and incontinence with poor appetite x 1 week. . Pt has hx of MS uses a walker at home. Pt reports that she slide of the couch and was fnd on the floor per family friend no injuries or pain to reports. . Pt had no injuries. 20 G left forearm.   170/114, HR 120, CBG 116, RR 20 97% RA.    Allergies Allergies  Allergen Reactions  . Ciprofloxacin Other (See Comments)    Dizziness and headache  . Codeine Other (See Comments)    GI upset  . Erythromycin Other (See Comments)    Unknown    Level of Care/Admitting Diagnosis ED Disposition    ED Disposition Condition Comment   Admit  Hospital Area: [facility] [[id number]]  Level of Care: Med-Surg [16]  Covid Evaluation: N/A  Diagnosis: Dehydration [276.51.ICD-9-CM]  Admitting Physician: Alwyn Ren [[id number]]  Attending Physician: Alwyn Ren [[id number]]  PT Class (Do Not Modify): Observation [104]  PT Acc Code (Do Not Modify): Observation [10022]       B Medical/Surgery History Past Medical History:  Diagnosis Date  . Hypertension   . MS (multiple sclerosis) (HCC)   . Osteopenia 05/2014   T score -1.8 FRAX 16%/4.7%  . Thyroid disease    Hypothyroid  . Trigeminal neuralgia    Past Surgical History:  Procedure Laterality Date  . BACK SURGERY  2011  . ELBOW SURGERY  1999   shattered elbow  . feet surgery  1990  . gamma knife  2005  . radiofrquency rhizotomy  [id number]  . underarm surgery  1988     A IV Location/Drains/Wounds Patient  Lines/Drains/Airways Status   Active Line/Drains/Airways    Name:   Placement date:   Placement time:   Site:   Days:   Peripheral IV 02/10/19 Left Forearm   02/10/19    1337    Forearm   less than 1          Intake/Output Last 24 hours No intake or output data in the 24 hours ending 02/10/19 1739  Labs/Imaging Results for orders placed or performed during the hospital encounter of 02/10/19 (from the past 48 hour(s))  CBG monitoring, ED     Status: None   Collection Time: 02/10/19  1:34 PM  Result Value Ref Range   Glucose-Capillary 90 70 - 99 mg/dL  Basic metabolic panel     Status: Abnormal   Collection Time: 02/10/19  2:07 PM  Result Value Ref Range   Sodium 139 135 - 145 mmol/L   Potassium 3.4 (L) 3.5 - 5.1 mmol/L   Chloride 107 98 - 111 mmol/L   CO2 21 (L) 22 - 32 mmol/L   Glucose, Bld 95 70 - 99 mg/dL   BUN 39 (H) 8 - 23 mg/dL   Creatinine, Ser 5.36 0.44 - 1.00 mg/dL   Calcium 9.3 8.9 - 46.8 mg/dL   GFR calc non Af Amer >60 >60 mL/min   GFR calc Af Amer >60 >60 mL/min   Anion gap 11 5 -  15    Comment: Performed at [facility], [address] [place], [place] [address]  CBC     Status: Abnormal   Collection Time: 02/10/19  2:07 PM  Result Value Ref Range   WBC 13.5 (H) 4.0 - 10.5 K/uL   RBC 4.28 3.87 - 5.11 MIL/uL   Hemoglobin 13.5 12.0 - 15.0 g/dL   HCT 91.4 78.2 - 95.6 %   MCV 93.5 80.0 - 100.0 fL   MCH 31.5 26.0 - 34.0 pg   MCHC 33.8 30.0 - 36.0 g/dL   RDW 21.3 08.6 - 57.8 %   Platelets 237 150 - 400 K/uL   nRBC 0.0 0.0 - 0.2 %    Comment: Performed at [facility], [address], [place], [place] [address]  Urinalysis, Routine w reflex microscopic     Status: Abnormal   Collection Time: 02/10/19  3:10 PM  Result Value Ref Range   Color, Urine YELLOW YELLOW   APPearance HAZY (A) CLEAR   Specific Gravity, Urine 1.017 1.005 - 1.030   pH 6.0 5.0 - 8.0   Glucose, UA NEGATIVE NEGATIVE mg/dL   Hgb urine dipstick SMALL  (A) NEGATIVE   Bilirubin Urine NEGATIVE NEGATIVE   Ketones, ur 20 (A) NEGATIVE mg/dL   Protein, ur NEGATIVE NEGATIVE mg/dL   Nitrite NEGATIVE NEGATIVE   Leukocytes,Ua NEGATIVE NEGATIVE   RBC / HPF 0-5 0 - 5 RBC/hpf   WBC, UA 11-20 0 - 5 WBC/hpf   Bacteria, UA RARE (A) NONE SEEN    Comment: Performed at [facility], [address] [place], [place] [address]   No results found.  Pending Labs Unresulted Labs (From admission, onward)    Start     Ordered   02/10/19 1651  HIV antibody (Routine Testing)  Once,   R     02/10/19 1651          Vitals/Pain Today's Vitals   02/10/19 1600 02/10/19 1646 02/10/19 1700 02/10/19 1730  BP: (!) 134/97 (!) 134/97 (!) 138/91   Pulse: 92 91 89 91  Resp: (!) (!) 21  Temp:      TempSrc:      SpO2: 100% 98% 99% 97%    Isolation Precautions No active isolations  Medications Medications  sodium chloride flush (NS) 0.9 % injection 3 mL (has no administration in time range)  cefTRIAXone (ROCEPHIN) 1 g in sodium chloride 0.9 % 100 mL IVPB (1 g Intravenous New Bag/Given 02/10/19 1636)  enoxaparin (LOVENOX) injection 30 mg (has no administration in time range)  cefTRIAXone (ROCEPHIN) 1 g in sodium chloride 0.9 % 100 mL IVPB (has no administration in time range)  0.9 %  sodium chloride infusion (has no administration in time range)  albuterol (VENTOLIN HFA) 108 (90 Base) MCG/ACT inhaler 2 puff (has no administration in time range)  amitriptyline (ELAVIL) tablet 50 mg (has no administration in time range)  carbamazepine (TEGRETOL XR) 12 hr tablet 400 mg (has no administration in time range)  cholecalciferol (VITAMIN D3) tablet 1,000 Units (has no administration in time range)  diazepam (VALIUM) tablet 5 mg (has no administration in time range)  HYDROcodone-acetaminophen (NORCO) 7.5-325 MG per tablet 1 tablet (has no administration in time range)  levothyroxine (SYNTHROID) tablet 50 mcg (has no administration in time  range)  metoprolol succinate (TOPROL-XL) 24 hr tablet 25 mg (has no administration in time range)  sodium chloride 0.9 % bolus 500 mL (500 mLs Intravenous  New Bag/Given 02/10/19 1457)    Mobility walks with device Moderate fall risk   Focused Assessments Neuro Assessment Handoff:  Swallow screen pass? n/a         Neuro Assessment:   Neuro Checks:      Last Documented NIHSS Modified Score:   Has TPA been given? No If patient is a Neuro Trauma and patient is going to OR before floor call report to 4N Charge nurse: [phone] or [phone]     R Recommendations: See Admitting Provider Note  Report given to:   Additional Notes: Pt has Son Thayer Jew

## 2019-02-10 NOTE — ED Triage Notes (Signed)
Pt arrived via EMS from home c/o generalized weakness  With increased frequent urination, and incontinence with poor appetite x 1 week. . Pt has hx of MS uses a walker at home. Pt reports that she slide of the couch and was fnd on the floor per family friend no injuries or pain to reports. . Pt had no injuries. 20 G left forearm.   170/114, HR 120, CBG 116, RR 20 97% RA.

## 2019-02-11 ENCOUNTER — Inpatient Hospital Stay (HOSPITAL_COMMUNITY): Payer: Medicare Other

## 2019-02-11 DIAGNOSIS — R278 Other lack of coordination: Secondary | ICD-10-CM | POA: Diagnosis not present

## 2019-02-11 DIAGNOSIS — Z1159 Encounter for screening for other viral diseases: Secondary | ICD-10-CM | POA: Diagnosis not present

## 2019-02-11 DIAGNOSIS — Z7951 Long term (current) use of inhaled steroids: Secondary | ICD-10-CM | POA: Diagnosis not present

## 2019-02-11 DIAGNOSIS — R7989 Other specified abnormal findings of blood chemistry: Secondary | ICD-10-CM

## 2019-02-11 DIAGNOSIS — N390 Urinary tract infection, site not specified: Secondary | ICD-10-CM | POA: Diagnosis not present

## 2019-02-11 DIAGNOSIS — R945 Abnormal results of liver function studies: Secondary | ICD-10-CM | POA: Diagnosis not present

## 2019-02-11 DIAGNOSIS — E876 Hypokalemia: Secondary | ICD-10-CM

## 2019-02-11 DIAGNOSIS — E781 Pure hyperglyceridemia: Secondary | ICD-10-CM | POA: Diagnosis present

## 2019-02-11 DIAGNOSIS — Z803 Family history of malignant neoplasm of breast: Secondary | ICD-10-CM | POA: Diagnosis not present

## 2019-02-11 DIAGNOSIS — Z823 Family history of stroke: Secondary | ICD-10-CM | POA: Diagnosis not present

## 2019-02-11 DIAGNOSIS — R739 Hyperglycemia, unspecified: Secondary | ICD-10-CM | POA: Diagnosis not present

## 2019-02-11 DIAGNOSIS — M6281 Muscle weakness (generalized): Secondary | ICD-10-CM | POA: Diagnosis not present

## 2019-02-11 DIAGNOSIS — Z7401 Bed confinement status: Secondary | ICD-10-CM | POA: Diagnosis not present

## 2019-02-11 DIAGNOSIS — G35 Multiple sclerosis: Secondary | ICD-10-CM

## 2019-02-11 DIAGNOSIS — D72829 Elevated white blood cell count, unspecified: Secondary | ICD-10-CM | POA: Diagnosis not present

## 2019-02-11 DIAGNOSIS — M2550 Pain in unspecified joint: Secondary | ICD-10-CM | POA: Diagnosis not present

## 2019-02-11 DIAGNOSIS — R2681 Unsteadiness on feet: Secondary | ICD-10-CM | POA: Diagnosis not present

## 2019-02-11 DIAGNOSIS — E039 Hypothyroidism, unspecified: Secondary | ICD-10-CM

## 2019-02-11 DIAGNOSIS — R32 Unspecified urinary incontinence: Secondary | ICD-10-CM | POA: Diagnosis present

## 2019-02-11 DIAGNOSIS — Z7982 Long term (current) use of aspirin: Secondary | ICD-10-CM | POA: Diagnosis not present

## 2019-02-11 DIAGNOSIS — Z885 Allergy status to narcotic agent status: Secondary | ICD-10-CM | POA: Diagnosis not present

## 2019-02-11 DIAGNOSIS — R262 Difficulty in walking, not elsewhere classified: Secondary | ICD-10-CM | POA: Diagnosis not present

## 2019-02-11 DIAGNOSIS — R2689 Other abnormalities of gait and mobility: Secondary | ICD-10-CM | POA: Diagnosis not present

## 2019-02-11 DIAGNOSIS — D649 Anemia, unspecified: Secondary | ICD-10-CM | POA: Diagnosis not present

## 2019-02-11 DIAGNOSIS — Z881 Allergy status to other antibiotic agents status: Secondary | ICD-10-CM | POA: Diagnosis not present

## 2019-02-11 DIAGNOSIS — M8580 Other specified disorders of bone density and structure, unspecified site: Secondary | ICD-10-CM | POA: Diagnosis present

## 2019-02-11 DIAGNOSIS — E872 Acidosis: Secondary | ICD-10-CM | POA: Diagnosis not present

## 2019-02-11 DIAGNOSIS — Z79890 Hormone replacement therapy (postmenopausal): Secondary | ICD-10-CM | POA: Diagnosis not present

## 2019-02-11 DIAGNOSIS — R531 Weakness: Secondary | ICD-10-CM | POA: Diagnosis not present

## 2019-02-11 DIAGNOSIS — F411 Generalized anxiety disorder: Secondary | ICD-10-CM | POA: Diagnosis not present

## 2019-02-11 DIAGNOSIS — I10 Essential (primary) hypertension: Secondary | ICD-10-CM | POA: Diagnosis not present

## 2019-02-11 DIAGNOSIS — G500 Trigeminal neuralgia: Secondary | ICD-10-CM

## 2019-02-11 DIAGNOSIS — R410 Disorientation, unspecified: Secondary | ICD-10-CM | POA: Diagnosis not present

## 2019-02-11 DIAGNOSIS — R1312 Dysphagia, oropharyngeal phase: Secondary | ICD-10-CM | POA: Diagnosis not present

## 2019-02-11 DIAGNOSIS — R3989 Other symptoms and signs involving the genitourinary system: Secondary | ICD-10-CM

## 2019-02-11 DIAGNOSIS — R41841 Cognitive communication deficit: Secondary | ICD-10-CM | POA: Diagnosis not present

## 2019-02-11 DIAGNOSIS — F17210 Nicotine dependence, cigarettes, uncomplicated: Secondary | ICD-10-CM | POA: Diagnosis present

## 2019-02-11 DIAGNOSIS — R5381 Other malaise: Secondary | ICD-10-CM | POA: Diagnosis not present

## 2019-02-11 DIAGNOSIS — Z8249 Family history of ischemic heart disease and other diseases of the circulatory system: Secondary | ICD-10-CM | POA: Diagnosis not present

## 2019-02-11 DIAGNOSIS — E860 Dehydration: Secondary | ICD-10-CM | POA: Diagnosis not present

## 2019-02-11 DIAGNOSIS — R400 Somnolence: Secondary | ICD-10-CM | POA: Diagnosis not present

## 2019-02-11 LAB — CBC WITH DIFFERENTIAL/PLATELET
Abs Immature Granulocytes: 0.08 10*3/uL — ABNORMAL HIGH (ref 0.00–0.07)
Basophils Absolute: 0.1 10*3/uL (ref 0.0–0.1)
Basophils Relative: 1 %
Eosinophils Absolute: 0.2 10*3/uL (ref 0.0–0.5)
Eosinophils Relative: 2 %
HCT: 37 % (ref 36.0–46.0)
Hemoglobin: 12 g/dL (ref 12.0–15.0)
Immature Granulocytes: 1 %
Lymphocytes Relative: 18 %
Lymphs Abs: 1.7 10*3/uL (ref 0.7–4.0)
MCH: 31.9 pg (ref 26.0–34.0)
MCHC: 32.4 g/dL (ref 30.0–36.0)
MCV: 98.4 fL (ref 80.0–100.0)
Monocytes Absolute: 1.1 10*3/uL — ABNORMAL HIGH (ref 0.1–1.0)
Monocytes Relative: 11 %
Neutro Abs: 6.5 10*3/uL (ref 1.7–7.7)
Neutrophils Relative %: 67 %
Platelets: 218 10*3/uL (ref 150–400)
RBC: 3.76 MIL/uL — ABNORMAL LOW (ref 3.87–5.11)
RDW: 13.3 % (ref 11.5–15.5)
WBC: 9.6 10*3/uL (ref 4.0–10.5)
nRBC: 0 % (ref 0.0–0.2)

## 2019-02-11 LAB — COMPREHENSIVE METABOLIC PANEL
ALT: 76 U/L — ABNORMAL HIGH (ref 0–44)
AST: 52 U/L — ABNORMAL HIGH (ref 15–41)
Albumin: 3.5 g/dL (ref 3.5–5.0)
Alkaline Phosphatase: 54 U/L (ref 38–126)
Anion gap: 10 (ref 5–15)
BUN: 25 mg/dL — ABNORMAL HIGH (ref 8–23)
CO2: 22 mmol/L (ref 22–32)
Calcium: 8.4 mg/dL — ABNORMAL LOW (ref 8.9–10.3)
Chloride: 107 mmol/L (ref 98–111)
Creatinine, Ser: 0.76 mg/dL (ref 0.44–1.00)
GFR calc Af Amer: >60 mL/min (ref >60)
GFR calc non Af Amer: >60 mL/min (ref >60)
Glucose, Bld: 107 mg/dL — ABNORMAL HIGH (ref 70–99)
Potassium: 2.6 mmol/L — CL (ref 3.5–5.1)
Sodium: 139 mmol/L (ref 135–145)
Total Bilirubin: 0.7 mg/dL (ref 0.3–1.2)
Total Protein: 6.8 g/dL (ref 6.5–8.1)

## 2019-02-11 LAB — MAGNESIUM: Magnesium: 1.7 mg/dL (ref 1.7–2.4)

## 2019-02-11 LAB — PHOSPHORUS: Phosphorus: 1.9 mg/dL — ABNORMAL LOW (ref 2.5–4.6)

## 2019-02-11 LAB — TSH: TSH: 4.762 u[IU]/mL — ABNORMAL HIGH (ref 0.350–4.500)

## 2019-02-11 LAB — T4, FREE: Free T4: 0.98 ng/dL (ref 0.82–1.77)

## 2019-02-11 MED ORDER — POTASSIUM CHLORIDE CRYS ER 20 MEQ PO TBCR
40.0000 meq | EXTENDED_RELEASE_TABLET | Freq: Two times a day (BID) | ORAL | Status: AC
  Administered 2019-02-11 (×2): 40 meq via ORAL
  Filled 2019-02-11 (×2): qty 2

## 2019-02-11 MED ORDER — POTASSIUM PHOSPHATES 15 MMOLE/5ML IV SOLN
20.0000 mmol | Freq: Once | INTRAVENOUS | Status: AC
  Administered 2019-02-11: 20 mmol via INTRAVENOUS
  Filled 2019-02-11: qty 6.67

## 2019-02-11 MED ORDER — POTASSIUM CHLORIDE 10 MEQ/100ML IV SOLN
10.0000 meq | INTRAVENOUS | Status: AC
  Administered 2019-02-11 (×2): 10 meq via INTRAVENOUS
  Filled 2019-02-11 (×2): qty 100

## 2019-02-11 MED ORDER — POTASSIUM CHLORIDE 10 MEQ/100ML IV SOLN
INTRAVENOUS | Status: AC
  Administered 2019-02-11: 10 meq
  Filled 2019-02-11: qty 100

## 2019-02-11 NOTE — Progress Notes (Signed)
PROGRESS NOTE    Melanie Richardson  TMH:[id number] DOB: 1951-07-09 DOA: 02/10/2019 PCP: Salley Scarlet, MD   Brief Narrative:  HPI per Dr. Jerelene Redden on 02/10/2019 Melanie Richardson is a 68 y.o. female with medical history significant of multiple sclerosis came in with complaints of generalized weakness.  She has been weak over the last 1 week with increasing episodes of urinary incontinence which is unusual for her.  She denies any burning urination.  She denies any fever chest pain shortness of breath cough nausea vomiting diarrhea abdominal pain.  At baseline she walks with a walker.  However she has a wheelchair at home for longer distances.  She has aides checking on her several times during the day but she lives alone.  Her appetite decreased p.o. intake and dizziness upon standing.  **Interim History  Worked with physical therapy and they recommending skilled nursing facility.  Obtaining urine culture as well as blood cultures and continue IV fluids given her weakness.  Assessment & Plan:   Active Problems:   MS (multiple sclerosis) (HCC)   Trigeminal neuralgia   Hypothyroidism   Weakness   Anxiety state   Hypertension   Dehydration   UTI (urinary tract infection)   Hypokalemia   Hyperphosphatemia   Abnormal LFTs   Leukocytosis   Hyperglycemia  Generalized Weakness Secondary to Dehydration from poor po intake along with likely concomitant UTI  -Changed patient to Inpatient Status given continued profound weakness from Dehydration requiring Maintenance IVF, Significant Hypokalemia requiring aggressive Repletion and suspected UTI requiring IV Abx with Ceftriaxone -Has had a decreased po intake and dizziness on standing; Will consult Nutrition and continue to Nutritional Supplements with Ensure Enlive 237 mL BID  -PT Evaluated and recommending SNF  -Hypokalemia likely contributing and aggressively repleting -Continue IVF with NS at 100 mL/hr given dehydration; Given 500 mL  Bolus yesterday  -Urinalysis done on Admission and showed easy appearance with small hemoglobin, 20 ketones, negative nitrites, rare bacteria, 11-20 WBCs, 0-5 RBCs per high-power field, negative leukocytes -Urine culture was never drawn admission so we will be obtaining now -Check blood cultures x2 as well given her weakness to make sure there is not a bacteremia -She is found to have frequent urination and generalized weakness along with a leukocytosis.  Likely due to dehydration could have caused a increased leukocytosis but could also have been from a urinary tract infection -Still had some mild dry mucous membranes and ketones in the urine so we will continue IV fluids today -Continue with IV antibiotics and repeat orthostatic vital signs -Orthostatics currently not done -Continue to monitor WBC and temperature curve and continue IV fluid hydration for now -Repeat CBC in a.m.  Suspected UTI -Urinalysis done on Admission and showed easy appearance with small hemoglobin, 20 ketones, negative nitrites, rare bacteria, 11-20 WBCs, 0-5 RBCs per high-power field, negative leukocytes -Urine culture was never drawn admission so we will be obtaining now -Check blood cultures x2 as well given her weakness to make sure there is not a bacteremia -C/w Empiric IV Ceftriaxone for now   History of MS  Trigeminal Neuralgia  -C/w Amitriptyline 50 mg po qHS, Carbamazepine 400 mg po TID, and Diazepam 5 mg po q12hprn along with Muscle Rub Cream   Hypertension -Currently Holding Losartan and HCTZ -C/w Metoprolol XL 25 mg po Daily   Osteopenia -C/w Cholecalciferol 1,000 units po Daily   Hypothyroidsm  -TSH was 4.762 and Free T4 was 0.98 -Continue Levothyroxine 50 mcg po Daily  Hypokalemia -Patient's K+ this AM ws 2.6 -Replete with po KCl 40 mEQ BID x2, IV KPhos 20 mmol and IV KCl 30 mEQ -Continue to Monitor and Replete as Necessary -Repeat CMP in AM  Hypophosphatemia -Patient's Phos Level  this AM was 1.9 -Replete with IV K-Phos 20 mmol -Continue to Monitor and Replete as Necessary -Repeat Phos Level in AM   Abnormal LFTs -Unclear etiology at this point -AST was 52 and ALT was 76 -Check right upper quadrant ultrasound as well as an acute hepatitis panel -Continue monitor trend hepatic function panel -Repeat CMP in a.m.  Leukocytosis -Likely in the setting of infection versus dehydration were combination of both -WBC went from 13.5 is now 9.6 -Continue with empiric antibiotics as above -Continue monitor for signs and symptoms of infection -Repeat CBC in a.m.  Hyperglycemia -Patient's blood sugar this morning was 107 on CMP -Check hemoglobin A1c in the a.m. -Continue monitor and trend blood sugars and if consistently elevated will place on sensitive NovoLog Sliding scale insulin AC  DVT prophylaxis: Enoxaparin 40 mg sq q24h Code Status: FULL CODE  Family Communication: No family present at bedside  Disposition Plan: SNF if patient is agreeable.  Consultants:   None  Procedures:  None   Antimicrobials:  Anti-infectives (From admission, onward)   Start     Dose/Rate Route Frequency Ordered Stop   02/11/19 1600  cefTRIAXone (ROCEPHIN) 1 g in sodium chloride 0.9 % 100 mL IVPB     1 g 200 mL/hr over 30 Minutes Intravenous Every 24 hours 02/10/19 1652     02/10/19 1615  cefTRIAXone (ROCEPHIN) 1 g in sodium chloride 0.9 % 100 mL IVPB  Status:  Discontinued     1 g 200 mL/hr over 30 Minutes Intravenous Every 24 hours 02/10/19 1611 02/10/19 1818     Subjective: Seen and examined at bedside and she emains somewhat weak and saying that she was having some increased urination still.  Denies chest pain, lightheadedness or dizziness.  No abdominal pain.  No other concerns or complaints at this time.  Objective: Vitals:   02/11/19 0645 02/11/19 1023 02/11/19 1026 02/11/19 1514  BP: 99/82 (!) 132/115 (!) 121/94 135/87  Pulse: 86 (!) 142 96 93  Resp: 17 18  18    Temp: 98.2 F (36.8 C) 98.3 F (36.8 C)  98.9 F (37.2 C)  TempSrc: Oral   Oral  SpO2: 96% 100%  97%    Intake/Output Summary (Last 24 hours) at 02/11/2019 1611 Last data filed at 02/11/2019 0300 Gross per 24 hour  Intake 1661.67 ml  Output -  Net 1661.67 ml   There were no vitals filed for this visit.  Examination: Physical Exam:  Constitutional: Thin Caucasian female in NAD and appears calm Eyes: Lids and conjunctivae normal, sclerae anicteric  ENMT: External Ears, Nose appear normal. Grossly normal hearing. Mucous membranes were a little dry Neck: Appears normal, supple, no cervical masses, normal ROM, no appreciable thyromegaly; no JVD Respiratory: Diminished to auscultation bilaterally, no wheezing, rales, rhonchi or crackles. Normal respiratory effort and patient is not tachypenic. No accessory muscle use.  Cardiovascular: RRR, no murmurs / rubs / gallops. S1 and S2 auscultated. No LE Edema  Abdomen: Soft, non-tender, non-distended. No masses palpated. No appreciable hepatosplenomegaly. Bowel sounds positive x4.  GU: Deferred. Musculoskeletal: No clubbing / cyanosis of digits/nails. No joint deformity upper and lower extremities.  Skin: No rashes, lesions, ulcers on a limited skin evaluation. No induration; Warm and dry.  Neurologic: CN 2-12  grossly intact with no focal deficits.  Romberg sign and cerebellar reflexes not assessed.  Psychiatric: Normal judgment and insight. Alert and oriented x 3. Slightly anxious mood and appropriate affect.   Data Reviewed: I have personally reviewed following labs and imaging studies  CBC: Recent Labs  Lab 02/10/19 1407 02/11/19 0907  WBC 13.5* 9.6  NEUTROABS  --  6.5  HGB 13.5 12.0  HCT 40.0 37.0  MCV 93.5 98.4  PLT 237 218   Basic Metabolic Panel: Recent Labs  Lab 02/10/19 1407 02/11/19 0907  NA 139 139  K 3.4* 2.6*  CL 107 107  CO2 21* 22  GLUCOSE 95 107*  BUN 39* 25*  CREATININE 0.94 0.76  CALCIUM 9.3 8.4*  MG  --   1.7  PHOS  --  1.9*   GFR: CrCl cannot be calculated (Unknown ideal weight.). Liver Function Tests: Recent Labs  Lab 02/11/19 0907  AST 52*  ALT 76*  ALKPHOS 54  BILITOT 0.7  PROT 6.8  ALBUMIN 3.5   No results for input(s): LIPASE, AMYLASE in the last 168 hours. No results for input(s): AMMONIA in the last 168 hours. Coagulation Profile: No results for input(s): INR, PROTIME in the last 168 hours. Cardiac Enzymes: No results for input(s): CKTOTAL, CKMB, CKMBINDEX, TROPONINI in the last 168 hours. BNP (last 3 results) No results for input(s): PROBNP in the last 8760 hours. HbA1C: No results for input(s): HGBA1C in the last 72 hours. CBG: Recent Labs  Lab 02/10/19 1334  GLUCAP 90   Lipid Profile: No results for input(s): CHOL, HDL, LDLCALC, TRIG, CHOLHDL, LDLDIRECT in the last 72 hours. Thyroid Function Tests: Recent Labs    02/11/19 0907  TSH 4.762*  FREET4 0.98   Anemia Panel: No results for input(s): VITAMINB12, FOLATE, FERRITIN, TIBC, IRON, RETICCTPCT in the last 72 hours. Sepsis Labs: No results for input(s): PROCALCITON, LATICACIDVEN in the last 168 hours.  No results found for this or any previous visit (from the past 240 hour(s)).   RN Pressure Injury Documentation:   Radiology Studies: No results found.  Scheduled Meds: . amitriptyline  50 mg Oral QHS  . carbamazepine  400 mg Oral TID  . cholecalciferol  1,000 Units Oral Daily  . enoxaparin (LOVENOX) injection  40 mg Subcutaneous Q24H  . feeding supplement (ENSURE ENLIVE)  237 mL Oral BID BM  . HYDROcodone-acetaminophen  1 tablet Oral Q8H  . levothyroxine  50 mcg Oral Q0600  . metoprolol succinate  25 mg Oral Daily  . potassium chloride  40 mEq Oral BID  . sodium chloride flush  3 mL Intravenous Once   Continuous Infusions: . sodium chloride 100 mL/hr at 02/10/19 1847  . cefTRIAXone (ROCEPHIN)  IV    . potassium chloride    . potassium PHOSPHATE IVPB (in mmol) 20 mmol (02/11/19 1158)     LOS: 0 days   Merlene Laughtermair Latif Sheikh, DO [facility] PAGER is on AMION  If 7PM-7AM, please contact night-coverage [email or web address] Password [email or web address] 02/11/2019, 4:11 PM

## 2019-02-11 NOTE — Progress Notes (Signed)
CRITICAL VALUE ALERT  Critical Value:  Potassium 2.6  Date & Time Notied:  02/11/2019 ,1015  Provider Notified: Dr Marland Mcalpine  Orders Received/Actions taken: yes

## 2019-02-11 NOTE — Evaluation (Signed)
Physical Therapy Evaluation Patient Details Name: Melanie Richardson MRN: [id number] DOB: 10/16/1950 Today's Date: 02/11/2019   History of Present Illness  Pt admitted with generalized weakness 2* dehydration and UTI.  Pt with hx of MS   Clinical Impression  Pt admitted as above and presenting with functional mobility limitations 2* generalized weakness and balance deficits.  Pt would benefit from follow up at SNF level rehab to maximize IND and safety prior to return home ALONE.  This session pt also limited by orthostatic BP - Supine 121/94; sit 136/88; stand 101/75.    Follow Up Recommendations SNF    Equipment Recommendations  None recommended by PT    Recommendations for Other Services       Precautions / Restrictions Precautions Precautions: Fall Restrictions Weight Bearing Restrictions: No      Mobility  Bed Mobility Overal bed mobility: Needs Assistance Bed Mobility: Supine to Sit;Sit to Supine     Supine to sit: Min assist Sit to supine: Min assist   General bed mobility comments: increased time with use of bedrail  Transfers Overall transfer level: Needs assistance Equipment used: Rolling walker (2 wheeled) Transfers: Sit to/from Stand Sit to Stand: Min assist         General transfer comment: cues for safe transition position and use of UEs to self assist  Ambulation/Gait             General Gait Details: Pt stood only, ambulation not attempted 2* increased dizziness  Stairs            Wheelchair Mobility    Modified Rankin (Stroke Patients Only)       Balance Overall balance assessment: Needs assistance Sitting-balance support: No upper extremity supported;Feet supported Sitting balance-Leahy Scale: Good     Standing balance support: Bilateral upper extremity supported Standing balance-Leahy Scale: Poor                               Pertinent Vitals/Pain Pain Assessment: No/denies pain    Home Living  Family/patient expects to be discharged to:: Skilled nursing facility Living Arrangements: Alone               Additional Comments: Pt states had home assist prior to Covid but now was unable to manage on own    Prior Function Level of Independence: Needs assistance   Gait / Transfers Assistance Needed: [employer]td ambulation last 2 weeks 2* increasing weakness           Hand Dominance        Extremity/Trunk Assessment   Upper Extremity Assessment Upper Extremity Assessment: Generalized weakness    Lower Extremity Assessment Lower Extremity Assessment: Generalized weakness       Communication   Communication: No difficulties  Cognition Arousal/Alertness: Awake/alert Behavior During Therapy: Anxious;Impulsive Overall Cognitive Status: No family/caregiver present to determine baseline cognitive functioning                                 General Comments: easily distracted      General Comments      Exercises     Assessment/Plan    PT Assessment Patient needs continued PT services  PT Problem List Decreased strength;Decreased activity tolerance;Decreased balance;Decreased mobility;Decreased knowledge of use of DME;Decreased safety awareness       PT Treatment Interventions DME instruction;Gait training;Functional mobility training;Therapeutic activities;Therapeutic exercise;Patient/family education  PT Goals (Current goals can be found in the Care Plan section)  Acute Rehab PT Goals Patient Stated Goal: Regain IND PT Goal Formulation: With patient Time For Goal Achievement: 02/25/19 Potential to Achieve Goals: Good    Frequency Min 3X/week   Barriers to discharge Decreased caregiver support Lives alone    Co-evaluation               AM-PAC PT "6 Clicks" Mobility  Outcome Measure Help needed turning from your back to your side while in a flat bed without using bedrails?: A Little Help needed moving from lying on your back  to sitting on the side of a flat bed without using bedrails?: A Little Help needed moving to and from a bed to a chair (including a wheelchair)?: A Little Help needed standing up from a chair using your arms (e.g., wheelchair or bedside chair)?: A Little Help needed to walk in hospital room?: A Lot Help needed climbing 3-5 steps with a railing? : Total 6 Click Score: 15    End of Session Equipment Utilized During Treatment: Gait belt Activity Tolerance: Patient limited by fatigue Patient left: in bed;with call bell/phone within reach Nurse Communication: Mobility status PT Visit Diagnosis: Muscle weakness (generalized) (M62.81);History of falling (Z91.81);Difficulty in walking, not elsewhere classified (R26.2)    Time: 5110-2111 PT Time Calculation (min) (ACUTE ONLY): 20 min   Charges:   PT Evaluation $PT Eval Low Complexity: 1 Low          Mauro Kaufmann PT Acute Rehabilitation Services Pager [phone] Office [phone]   Monroe Toure 02/11/2019, 12:27 PM

## 2019-02-12 DIAGNOSIS — D649 Anemia, unspecified: Secondary | ICD-10-CM

## 2019-02-12 LAB — CBC WITH DIFFERENTIAL/PLATELET
Abs Immature Granulocytes: 0.07 10*3/uL (ref 0.00–0.07)
Basophils Absolute: 0.1 10*3/uL (ref 0.0–0.1)
Basophils Relative: 2 %
Eosinophils Absolute: 0.2 10*3/uL (ref 0.0–0.5)
Eosinophils Relative: 3 %
HCT: 34.4 % — ABNORMAL LOW (ref 36.0–46.0)
Hemoglobin: 11.6 g/dL — ABNORMAL LOW (ref 12.0–15.0)
Immature Granulocytes: 1 %
Lymphocytes Relative: 34 %
Lymphs Abs: 2.5 10*3/uL (ref 0.7–4.0)
MCH: 32.5 pg (ref 26.0–34.0)
MCHC: 33.7 g/dL (ref 30.0–36.0)
MCV: 96.4 fL (ref 80.0–100.0)
Monocytes Absolute: 0.9 10*3/uL (ref 0.1–1.0)
Monocytes Relative: 12 %
Neutro Abs: 3.6 10*3/uL (ref 1.7–7.7)
Neutrophils Relative %: 48 %
Platelets: 200 10*3/uL (ref 150–400)
RBC: 3.57 MIL/uL — ABNORMAL LOW (ref 3.87–5.11)
RDW: 13.1 % (ref 11.5–15.5)
WBC: 7.3 10*3/uL (ref 4.0–10.5)
nRBC: 0 % (ref 0.0–0.2)

## 2019-02-12 LAB — COMPREHENSIVE METABOLIC PANEL
ALT: 63 U/L — ABNORMAL HIGH (ref 0–44)
AST: 39 U/L (ref 15–41)
Albumin: 3.2 g/dL — ABNORMAL LOW (ref 3.5–5.0)
Alkaline Phosphatase: 48 U/L (ref 38–126)
Anion gap: 10 (ref 5–15)
BUN: 19 mg/dL (ref 8–23)
CO2: 20 mmol/L — ABNORMAL LOW (ref 22–32)
Calcium: 8.4 mg/dL — ABNORMAL LOW (ref 8.9–10.3)
Chloride: 110 mmol/L (ref 98–111)
Creatinine, Ser: 0.61 mg/dL (ref 0.44–1.00)
GFR calc Af Amer: >60 mL/min (ref >60)
GFR calc non Af Amer: >60 mL/min (ref >60)
Glucose, Bld: 83 mg/dL (ref 70–99)
Potassium: 4.3 mmol/L (ref 3.5–5.1)
Sodium: 140 mmol/L (ref 135–145)
Total Bilirubin: 0.6 mg/dL (ref 0.3–1.2)
Total Protein: 6.4 g/dL — ABNORMAL LOW (ref 6.5–8.1)

## 2019-02-12 LAB — T3: T3, Total: 58 ng/dL — ABNORMAL LOW (ref 71–180)

## 2019-02-12 LAB — HEMOGLOBIN A1C
Hgb A1c MFr Bld: 4.9 % (ref 4.8–5.6)
Mean Plasma Glucose: 93.93 mg/dL

## 2019-02-12 LAB — PHOSPHORUS: Phosphorus: 1.9 mg/dL — ABNORMAL LOW (ref 2.5–4.6)

## 2019-02-12 LAB — HIV ANTIBODY (ROUTINE TESTING W REFLEX): HIV Screen 4th Generation wRfx: NONREACTIVE

## 2019-02-12 LAB — MAGNESIUM: Magnesium: 1.6 mg/dL — ABNORMAL LOW (ref 1.7–2.4)

## 2019-02-12 MED ORDER — MAGNESIUM SULFATE 2 GM/50ML IV SOLN
2.0000 g | Freq: Once | INTRAVENOUS | Status: AC
  Administered 2019-02-12: 2 g via INTRAVENOUS
  Filled 2019-02-12: qty 50

## 2019-02-12 MED ORDER — K PHOS MONO-SOD PHOS DI & MONO 155-852-130 MG PO TABS
500.0000 mg | ORAL_TABLET | Freq: Two times a day (BID) | ORAL | Status: AC
  Administered 2019-02-12 (×2): 500 mg via ORAL
  Filled 2019-02-12 (×2): qty 2

## 2019-02-12 MED ORDER — SODIUM CHLORIDE 0.9 % IV SOLN
INTRAVENOUS | Status: AC
  Administered 2019-02-12 – 2019-02-13 (×2): via INTRAVENOUS

## 2019-02-12 NOTE — Progress Notes (Signed)
PROGRESS NOTE    Melanie Richardson  ZOX:[id number] DOB: 06/14/1951 DOA: 02/10/2019 PCP: Salley Scarleturham, Kawanta F, MD   Brief Narrative:  HPI per Dr. Jerelene ReddenElizabeth Richardson on 02/10/2019 Melanie Richardson is a 68 y.o. female with medical history significant of multiple sclerosis came in with complaints of generalized weakness.  She has been weak over the last 1 week with increasing episodes of urinary incontinence which is unusual for her.  She denies any burning urination.  She denies any fever chest pain shortness of breath cough nausea vomiting diarrhea abdominal pain.  At baseline she walks with a walker.  However she has a wheelchair at home for longer distances.  She has aides checking on her several times during the day but she lives alone.  Her appetite decreased p.o. intake and dizziness upon standing.  **Interim History  Worked with physical therapy and they recommending skilled nursing facility.  Obtaining urine culture as well as blood cultures and will continue IV fluids given her weakness but at a reduced rate.  Assessment & Plan:   Active Problems:   MS (multiple sclerosis) (HCC)   Trigeminal neuralgia   Hypothyroidism   Weakness   Anxiety state   Hypertension   Dehydration   UTI (urinary tract infection)   Hypokalemia   Hyperphosphatemia   Abnormal LFTs   Leukocytosis   Hyperglycemia  Generalized Weakness Secondary to Dehydration from poor po intake along with likely concomitant UTI  -Changed patient to Inpatient Status yesterday given continued profound weakness from Dehydration requiring Maintenance IVF at 100 mL/hr, Significant Hypokalemia requiring aggressive Repletion and suspected UTI requiring IV Abx with Ceftriaxone; Urine Cx is still pending  -Has had a decreased po intake and dizziness on standing; Will consult Nutrition and continue to Nutritional Supplements with Ensure Enlive 237 mL BID  -PT Evaluated and recommending SNF  -Hypokalemia likely contributing and aggressively  repleting -Continued IVF with NS at 100 mL/hr given dehydration and reduced rate to 75 mL/hr; Given 500 mL Bolus the day before yesterday  -Urinalysis done on Admission and showed easy appearance with small hemoglobin, 20 ketones, negative nitrites, rare bacteria, 11-20 WBCs, 0-5 RBCs per high-power field, negative leukocytes -Urine culture was never drawn admission so we will be obtaining one after Antibiotics have been hung  -Check blood cultures x2 as well given her weakness to make sure there is not a bacteremia -She is found to have frequent urination and generalized weakness along with a leukocytosis.  Likely due to dehydration could have caused a increased Leukocytosis but could also have been from a urinary tract infection -Had Dry mucous membranes and ketones in the urine so was started on IVF and will continue IV fluids today as above -Continue with IV antibiotics and repeat orthostatic vital signs -Orthostatics currently not done today  -Continue to monitor WBC and temperature curve and continue IV fluid hydration for now at a reduced rate  -Recommend OOB and Up in Chair  -Repeat CBC in a.m.  Suspected UTI -Urinalysis done on Admission and showed easy appearance with small hemoglobin, 20 ketones, negative nitrites, rare bacteria, 11-20 WBCs, 0-5 RBCs per high-power field, negative leukocytes -Urine culture was never drawn admission but obtained after being on Abx; Urine Cx pending  -Check blood cultures x2 as well given her weakness to make sure there is not a bacteremia; Blood Cx x 2 still pending  -C/w Empiric IV Ceftriaxone for now   History of MS  Trigeminal Neuralgia  -C/w Amitriptyline 50 mg po  qHS, Carbamazepine 400 mg po TID, and Diazepam 5 mg po q12hprn along with Muscle Rub Cream   Hypertension -Currently Holding Losartan and HCTZ -C/w Metoprolol XL 25 mg po Daily   Osteopenia -C/w Cholecalciferol 1,000 units po Daily   Hypothyroidsm  -TSH was 4.762 and Free T4  was 0.98; T3 was 58 -Continue Levothyroxine 50 mcg po Daily   Hypokalemia, improved  -Patient's K+ was 2.6 and now is 4.3 -Replete with po KCl 40 mEQ BID x2, IV KPhos 20 mmol and IV KCl 30 mEQ yesterday  -Continue to Monitor and Replete as Necessary -Repeat CMP in AM  Hypophosphatemia -Patient's Phos Level this AM was 1.9 -Replete with IV K-Phos 20 mmol yesterday but nurse tells me it was never given for Unclear Reasons -Started po K-Phos Neutral 500 mg BID x2 Doses  -Continue to Monitor and Replete as Necessary -Repeat Phos Level in AM   Abnormal LFTs -Unclear etiology at this point -AST was 52 and ALT was 76 and has started improving as AST is 30 and ALT is 63 -Checked right upper quadrant ultrasound as well as an acute hepatitis panel -Acute Hepatitis Panel pending and RUQ U/S showed No acute sonographic abnormality detected. No sonographic evidence for cholelithiasis. -Continue monitor trend hepatic function panel -Repeat CMP in a.m.  Leukocytosis, improved  -Likely in the setting of infection versus dehydration were combination of both -WBC went from 13.5 -> 9.6 -> 7.3 -Continue with empiric antibiotics as above for now; She states Urinary Symptoms and frequency is decreasing  -Continue monitor for signs and symptoms of infection -Repeat CBC in a.m.  Hyperglycemia -Patient's blood sugar this morning was 107 on CMP -Checked Hemoglobin A1c and was 4.9. -Continue monitor and trend blood sugars and if consistently elevated will place on sensitive NovoLog Sliding scale insulin AC  Normocytic Anemia -Patient's Hgb/Hct went from 13.5/40.0 -> 12.0/37.0 -> 11.6/34.4 -Likely dilutional drop in the setting of her being hemoconcentrated on admission -Check anemia panel in the a.m. -Continue monitor for signs and symptoms of bleeding -Repeat CBC in the a.m.  Hypomagnesemia -Patient magnesium level was 1.6 -Replete with IV mag sulfate 2 g -Continue to monitor and replete as  necessary -Repeat magnesium level in a.m.  Metabolic Acidosis -Mild as CO2 was 20; AG is 10 -C/w IVF with NS at 75 mL/hr x 1 more Day -Continue to Monitor and Trend and Repeat CMP in AM   DVT prophylaxis: Enoxaparin 40 mg sq q24h Code Status: FULL CODE  Family Communication: No family present at bedside  Disposition Plan: SNF if patient is agreeable.  Consultants:   None  Procedures:  None   Antimicrobials:  Anti-infectives (From admission, onward)   Start     Dose/Rate Route Frequency Ordered Stop   02/11/19 1600  cefTRIAXone (ROCEPHIN) 1 g in sodium chloride 0.9 % 100 mL IVPB     1 g 200 mL/hr over 30 Minutes Intravenous Every 24 hours 02/10/19 1652     02/10/19 1615  cefTRIAXone (ROCEPHIN) 1 g in sodium chloride 0.9 % 100 mL IVPB  Status:  Discontinued     1 g 200 mL/hr over 30 Minutes Intravenous Every 24 hours 02/10/19 1611 02/10/19 1818     Subjective: Seen and examined at bedside she was resting but thinks she is feeling a little bit better today and states that her urinary frequency is not as much today.  Denies any chest pain, lightheadedness or dizziness.  No nausea or vomiting.  No other concerns or  complaints at this time.  Objective: Vitals:   02/11/19 1026 02/11/19 1514 02/11/19 2120 02/12/19 0509  BP: (!) 121/94 135/87 109/82 103/88  Pulse: 96 93 85 71  Resp:  Temp:  98.9 F (37.2 C) 98.4 F (36.9 C) 98.7 F (37.1 C)  TempSrc:  Oral Oral Oral  SpO2:  97% 96% 99%    Intake/Output Summary (Last 24 hours) at 02/12/2019 1210 Last data filed at 02/12/2019 0600 Gross per 24 hour  Intake 1088.94 ml  Output 0 ml  Net 1088.94 ml   There were no vitals filed for this visit.  Examination: Physical Exam:  Constitutional: Thin Caucasian female currently no acute distress and she was somnolent and sleeping and when awoken she was a little bit drowsy Eyes: Lids and conjunctive are normal.  Sclera anicteric. ENMT: External ears and nose appear  normal.  Grossly normal hearing.  Mucous membranes are getting better Neck: Appears supple no JVD Respiratory: Mildly diminished auscultation bilaterally no appreciable wheezing, rales, rhonchi.  Patient not tachypneic wheezing and accessory muscles to breathe Cardiovascular: Regular rate and rhythm.  No appreciable murmurs, rubs, gallops.  No lower extremity edema Abdomen: Soft, nontender, nondistended.  Bowel sounds present GU: Deferred Musculoskeletal: No clubbing or cyanosis.  No joint deformities in upper extremities Skin: Skin is warm dry no appreciable rashes or lesions limited skin evaluation. Neurologic: Cranial nerves II through XII gross intact no appreciable focal deficits.  Romberg sign cerebellar reflexes were not assessed Psychiatric: Has a normal judgment and insight.  She was drowsy after being awoken from her sleep and became awake and alert.  Not as anxious but does appear slightly depressed  Data Reviewed: I have personally reviewed following labs and imaging studies  CBC: Recent Labs  Lab 02/10/19 1407 02/11/19 0907 02/12/19 0619  WBC 13.5* 9.6 7.3  NEUTROABS  --  6.5 3.6  HGB 13.5 12.0 11.6*  HCT 40.0 37.0 34.4*  MCV 93.5 98.4 96.4  PLT 237 218 200   Basic Metabolic Panel: Recent Labs  Lab 02/10/19 1407 02/11/19 0907 02/12/19 0619  NA 139 139 140  K 3.4* 2.6* 4.3  CL 107 107 110  CO2 21* 22 20*  GLUCOSE 95 107* 83  BUN 39* 25* 19  CREATININE 0.94 0.76 0.61  CALCIUM 9.3 8.4* 8.4*  MG  --  1.7 1.6*  PHOS  --  1.9* 1.9*   GFR: CrCl cannot be calculated (Unknown ideal weight.). Liver Function Tests: Recent Labs  Lab 02/11/19 0907 02/12/19 0619  AST 52* 39  ALT 76* 63*  ALKPHOS 54 48  BILITOT 0.7 0.6  PROT 6.8 6.4*  ALBUMIN 3.5 3.2*   No results for input(s): LIPASE, AMYLASE in the last 168 hours. No results for input(s): AMMONIA in the last 168 hours. Coagulation Profile: No results for input(s): INR, PROTIME in the last 168 hours.  Cardiac Enzymes: No results for input(s): CKTOTAL, CKMB, CKMBINDEX, TROPONINI in the last 168 hours. BNP (last 3 results) No results for input(s): PROBNP in the last 8760 hours. HbA1C: Recent Labs    02/12/19 0619  HGBA1C 4.9   CBG: Recent Labs  Lab 02/10/19 1334  GLUCAP 90   Lipid Profile: No results for input(s): CHOL, HDL, LDLCALC, TRIG, CHOLHDL, LDLDIRECT in the last 72 hours. Thyroid Function Tests: Recent Labs    02/11/19 0907  TSH 4.762*  FREET4 0.98   Anemia Panel: No results for input(s): VITAMINB12, FOLATE, FERRITIN, TIBC, IRON, RETICCTPCT in the last 72  hours. Sepsis Labs: No results for input(s): PROCALCITON, LATICACIDVEN in the last 168 hours.  No results found for this or any previous visit (from the past 240 hour(s)).   RN Pressure Injury Documentation:   Radiology Studies: US Abdomen Limited Ruq  Result Date: 02/11/2019 CLINICAL DATA:  Abnormal LFTs x2 days. EXAM: ULTRASOUND ABDOMEN LIMITED RIGHT UPPER QUADRANT COMPARISON:  None. FINDINGS: Gallbladder: No gallstones or wall thickening visualized. No sonographic Murphy sign noted by sonographer. Common bile duct: Diameter: 0.4 cm Liver: No focal lesion identified. Within normal limits in parenchymal echogenicity. Portal vein is patent on color Doppler imaging with normal direction of blood flow towards the liver. IMPRESSION: No acute sonographic abnormality detected. No sonographic evidence for cholelithiasis. Electronically Signed   By: Katherine Mantle M.D.   On: 02/11/2019 18:01    Scheduled Meds: . amitriptyline  50 mg Oral QHS  . carbamazepine  400 mg Oral TID  . cholecalciferol  1,000 Units Oral Daily  . enoxaparin (LOVENOX) injection  40 mg Subcutaneous Q24H  . feeding supplement (ENSURE ENLIVE)  237 mL Oral BID BM  . HYDROcodone-acetaminophen  1 tablet Oral Q8H  . levothyroxine  50 mcg Oral Q0600  . metoprolol succinate  25 mg Oral Daily  . phosphorus  500 mg Oral BID  . sodium chloride flush   3 mL Intravenous Once   Continuous Infusions: . sodium chloride 100 mL/hr at 02/12/19 0200  . cefTRIAXone (ROCEPHIN)  IV Stopped (02/11/19 1725)    LOS: 1 day   Merlene Laughter, DO [facility] PAGER is on AMION  If 7PM-7AM, please contact night-coverage [email or web address] Password [email or web address] 02/12/2019, 12:10 PM

## 2019-02-12 NOTE — Plan of Care (Signed)
  Problem: Activity: Goal: Risk for activity intolerance will decrease Outcome: Progressing   Problem: Nutrition: Goal: Adequate nutrition will be maintained Outcome: Progressing   Problem: Coping: Goal: Level of anxiety will decrease Outcome: Progressing   Problem: Pain Managment: Goal: General experience of comfort will improve Outcome: Progressing   

## 2019-02-13 LAB — COMPREHENSIVE METABOLIC PANEL
ALT: 47 U/L — ABNORMAL HIGH (ref 0–44)
AST: 28 U/L (ref 15–41)
Albumin: 3.2 g/dL — ABNORMAL LOW (ref 3.5–5.0)
Alkaline Phosphatase: 52 U/L (ref 38–126)
Anion gap: 10 (ref 5–15)
BUN: 10 mg/dL (ref 8–23)
CO2: 22 mmol/L (ref 22–32)
Calcium: 8.3 mg/dL — ABNORMAL LOW (ref 8.9–10.3)
Chloride: 110 mmol/L (ref 98–111)
Creatinine, Ser: 0.56 mg/dL (ref 0.44–1.00)
GFR calc Af Amer: >60 mL/min (ref >60)
GFR calc non Af Amer: >60 mL/min (ref >60)
Glucose, Bld: 75 mg/dL (ref 70–99)
Potassium: 3.6 mmol/L (ref 3.5–5.1)
Sodium: 142 mmol/L (ref 135–145)
Total Bilirubin: 0.5 mg/dL (ref 0.3–1.2)
Total Protein: 6.4 g/dL — ABNORMAL LOW (ref 6.5–8.1)

## 2019-02-13 LAB — CBC WITH DIFFERENTIAL/PLATELET
Abs Immature Granulocytes: 0.04 10*3/uL (ref 0.00–0.07)
Basophils Absolute: 0.1 10*3/uL (ref 0.0–0.1)
Basophils Relative: 1 %
Eosinophils Absolute: 0.2 10*3/uL (ref 0.0–0.5)
Eosinophils Relative: 3 %
HCT: 34.7 % — ABNORMAL LOW (ref 36.0–46.0)
Hemoglobin: 11.5 g/dL — ABNORMAL LOW (ref 12.0–15.0)
Immature Granulocytes: 1 %
Lymphocytes Relative: 23 %
Lymphs Abs: 1.8 10*3/uL (ref 0.7–4.0)
MCH: 31.9 pg (ref 26.0–34.0)
MCHC: 33.1 g/dL (ref 30.0–36.0)
MCV: 96.1 fL (ref 80.0–100.0)
Monocytes Absolute: 0.8 10*3/uL (ref 0.1–1.0)
Monocytes Relative: 10 %
Neutro Abs: 4.9 10*3/uL (ref 1.7–7.7)
Neutrophils Relative %: 62 %
Platelets: 211 10*3/uL (ref 150–400)
RBC: 3.61 MIL/uL — ABNORMAL LOW (ref 3.87–5.11)
RDW: 13.2 % (ref 11.5–15.5)
WBC: 7.8 10*3/uL (ref 4.0–10.5)
nRBC: 0 % (ref 0.0–0.2)

## 2019-02-13 LAB — URINE CULTURE: Culture: NO GROWTH

## 2019-02-13 LAB — PHOSPHORUS: Phosphorus: 2.4 mg/dL — ABNORMAL LOW (ref 2.5–4.6)

## 2019-02-13 LAB — MAGNESIUM: Magnesium: 1.9 mg/dL (ref 1.7–2.4)

## 2019-02-13 MED ORDER — ENSURE ENLIVE PO LIQD
237.0000 mL | Freq: Two times a day (BID) | ORAL | Status: DC
  Administered 2019-02-13 – 2019-02-19 (×9): 237 mL via ORAL

## 2019-02-13 MED ORDER — HYDRALAZINE HCL 20 MG/ML IJ SOLN
10.0000 mg | Freq: Four times a day (QID) | INTRAMUSCULAR | Status: DC | PRN
  Administered 2019-02-13: 10 mg via INTRAVENOUS
  Filled 2019-02-13: qty 1

## 2019-02-13 MED ORDER — K PHOS MONO-SOD PHOS DI & MONO 155-852-130 MG PO TABS
500.0000 mg | ORAL_TABLET | Freq: Two times a day (BID) | ORAL | Status: AC
  Administered 2019-02-13 (×2): 500 mg via ORAL
  Filled 2019-02-13 (×2): qty 2

## 2019-02-13 MED ORDER — ADULT MULTIVITAMIN W/MINERALS CH
1.0000 | ORAL_TABLET | Freq: Every day | ORAL | Status: DC
  Administered 2019-02-13 – 2019-02-20 (×7): 1 via ORAL
  Filled 2019-02-13 (×8): qty 1

## 2019-02-13 MED ORDER — BOOST / RESOURCE BREEZE PO LIQD CUSTOM
1.0000 | ORAL | Status: DC
  Administered 2019-02-15 – 2019-02-20 (×3): 1 via ORAL

## 2019-02-13 NOTE — Progress Notes (Signed)
Initial Nutrition Assessment  RD working remotely.   DOCUMENTATION CODES:   (unable to assess for malnutrition at this time. )  INTERVENTION:  - continue Ensure Enlive BID, each supplement provides 350 kcal and 20 grams of protein - will order Boost Breeze once/day, each supplement provides 250 kcal and 9 grams of protein. - continue to encourage PO intakes.  - weigh patient today.    NUTRITION DIAGNOSIS:   Increased nutrient needs related to acute illness as evidenced by estimated needs.  GOAL:   Patient will meet greater than or equal to 90% of their needs  MONITOR:   PO intake, Supplement acceptance, Labs, Weight trends, I & O's  REASON FOR ASSESSMENT:   Consult Assessment of nutrition requirement/status, Poor PO  ASSESSMENT:   68 y.o. female with medical history significant of multiple sclerosis. She presented to the ED with complaint of generalized weakness x1 week with increasing episodes of urinary incontinence which is unusual for her. At baseline she walks with a walker, but does have a wheelchair at home for longer distances. She has aides checking on her several times during the day but she lives alone. She also reported having dizziness when standing recently.   No weight hx since 08/05/18 when she weighed 59.2 kg/130 lb. Per RN flow sheet, patient consumed 0% of breakfast, 15% of lunch, and 85% of dinner on 5/9. Patient is able to self feed/does not need feeding assistance. Ensure Enlive was ordered BID on 5/8 and she has accepted 3 of 5 bottles offered to her.   Patient reports that she has aides at home. She is able to get some food items on her own and aides also help with cooking/meal preparation. She typically has a good appetite but appetite has been decreased for the 1 week PTA with minimal intakes of solids and liquids during that time.   Per notes: generalized weakness 2/2 dehydration from poor PO intake and UTI, s/p PT evaluation with recommendation for  SNF, patient with osteopenia, hypokalemia--currently resolved after repletion, mild hypophosphatemia, abnormal LFTs--improving, lekocytosis--improved, hypomagnesemia--currently resolved, metabolic acidosis.     Medications reviewed; 1000 units cholecalciferol/day, 50 mcg oral synthroid/day, daily multivitamin with minerals, 500 mg KPhos neutral BID.  Labs reviewed; Ca: 8.3 mg/dl, Phos: 2.4 mg/dl.     NUTRITION - FOCUSED PHYSICAL EXAM:  unable to perform at this time.   Diet Order:   Diet Order            Diet regular Room service appropriate? Yes; Fluid consistency: Thin  Diet effective now              EDUCATION NEEDS:   No education needs have been identified at this time  Skin:  Skin Assessment: Reviewed RN Assessment  Last BM:  5/7  Height:   Ht Readings from Last 1 Encounters:  08/05/18 5\' 5"  (1.651 m)    Weight:   Wt Readings from Last 1 Encounters:  08/05/18 59.2 kg    Ideal Body Weight:     BMI:  There is no height or weight on file to calculate BMI.  Estimated Nutritional Needs:   Kcal:  1775-1950 kcal  Protein:  70-80 grams  Fluid:  >/= 2 L/day     Trenton Gammon, MS, RD, LDN, [facility] Inpatient Clinical Dietitian Pager # [phone] After hours/weekend pager # [phone]

## 2019-02-13 NOTE — Progress Notes (Signed)
PROGRESS NOTE    Melanie Richardson  ZOX:[id number] DOB: 01/07/1951 DOA: 02/10/2019 PCP: Salley Scarlet, MD   Brief Narrative:  HPI per Dr. Jerelene Redden on 02/10/2019 Melanie Richardson is a 68 y.o. female with medical history significant of multiple sclerosis came in with complaints of generalized weakness.  She has been weak over the last 1 week with increasing episodes of urinary incontinence which is unusual for her.  She denies any burning urination.  She denies any fever chest pain shortness of breath cough nausea vomiting diarrhea abdominal pain.  At baseline she walks with a walker.  However she has a wheelchair at home for longer distances.  She has aides checking on her several times during the day but she lives alone.  Her appetite decreased p.o. intake and dizziness upon standing.  **Interim History  Worked with physical therapy and they recommending skilled nursing facility discussed with the patient about going to a skilled nursing facility and she does not know if she can or not and did not participate with physical therapy today despite maximum encouragement.  Obtaining urine culture as well as blood cultures and will continue IV fluids given her weakness but at a reduced rate and will stop later today.  Assessment & Plan:   Active Problems:   MS (multiple sclerosis) (HCC)   Trigeminal neuralgia   Hypothyroidism   Weakness   Anxiety state   Hypertension   Dehydration   UTI (urinary tract infection)   Hypokalemia   Hyperphosphatemia   Abnormal LFTs   Leukocytosis   Hyperglycemia   Normocytic anemia  Generalized Weakness Secondary to Dehydration from poor po intake along with likely concomitant UTI  -Changed patient to Inpatient Status yesterday given continued profound weakness from Dehydration requiring Maintenance IVF at 100 mL/hr, Significant Hypokalemia requiring aggressive Repletion and suspected UTI requiring IV Abx with Ceftriaxone; Urine Cx is showed no growth  -Has had a decreased po intake and dizziness on standing; Will consult Nutrition and continue to Nutritional Supplements with Ensure Enlive 237 mL BID; nutritionist is working remotely and have added boost breeze -PT Evaluated and recommending SNF and will obtain Child psychotherapist consult.  Patient was not willing to work with physical therapy today -Hypokalemia likely contributing and aggressively repleting -Continued IVF with NS at 100 mL/hr given dehydration and reduced rate to 75 mL/hr and will stop; Given 500 mL Bolus on admission -Urinalysis done on Admission and showed easy appearance with small hemoglobin, 20 ketones, negative nitrites, rare bacteria, 11-20 WBCs, 0-5 RBCs per high-power field, negative leukocytes -Urine culture was never drawn admission so we will be obtaining one after Antibiotics have been hung  -Check blood cultures x2 as well given her weakness to make sure there is not a bacteremia; Blood Cultures x2 showed no growth to date at 2 days -She was found to have frequent urination and generalized weakness along with a leukocytosis.  Likely due to dehydration could have caused a increased Leukocytosis but could also have been from a urinary tract infection; urinary frequency is improving per patient -Had Dry mucous membranes and ketones in the urine so was started on IVF and was hydrated for last 2 days with NS for the last 2 days and will Stop today.  -Continue with IV antibiotics and repeat orthostatic vital signs -Orthostatics currently not done today as patient was wanting to rest -Continue to monitor WBC and temperature curve and continue IV fluid hydration for now at a reduced rate  -Recommend OOB  and Up in Chair patient did not want to get up today -Repeat CBC in AM  Suspected UTI -Urinalysis done on Admission and showed easy appearance with small hemoglobin, 20 ketones, negative nitrites, rare bacteria, 11-20 WBCs, 0-5 RBCs per high-power field, negative leukocytes  -Urine culture was never drawn admission but obtained after being on Abx; Urine Cx showed no growth -Check blood cultures x2 as well given her weakness to make sure there is not a bacteremia; Blood Cx x 2 showed no growth to date at 2 days -C/w Empiric IV Ceftriaxone for now 3 days of antibiotics now  History of MS  Trigeminal Neuralgia  -C/w Amitriptyline 50 mg po qHS, Carbamazepine 400 mg po TID, and Diazepam 5 mg po q12hprn along with Muscle Rub Cream   Hypertension -Currently Holding Losartan and HCTZ -C/w Metoprolol XL 25 mg po Daily   Osteopenia -C/w Cholecalciferol 1,000 units po Daily   Hypothyroidsm  -TSH was 4.762 and Free T4 was 0.98; T3 was 58 -Continue Levothyroxine 50 mcg po Daily   Hypokalemia, improved  -Patient's K+ was 2.6 and now is 3.6 -Continue to Monitor and Replete as Necessary -Repeat CMP in AM  Hypophosphatemia -Patient's Phos Level this AM was 2.4 -Replete with po K-Phos Neutral 500 mg BID x2 Doses again  -Continue to Monitor and Replete as Necessary -Repeat Phos Level in AM   Abnormal LFTs -Unclear etiology at this point -AST was 52 and ALT was 76 and has started improving as AST is 28 and ALT is 47 -Checked right upper quadrant ultrasound as well as an acute hepatitis panel -Acute Hepatitis Panel pending and RUQ U/S showed No acute sonographic abnormality detected. No sonographic evidence for cholelithiasis. -Continue monitor trend hepatic function panel -Repeat CMP in a.m.  Leukocytosis, improved  -Likely in the setting of infection versus dehydration were combination of both -WBC went from 13.5 -> 9.6 -> 7.3 -> 7.8 -Continue with empiric antibiotics as above for now; She states Urinary Symptoms and frequency is decreasing  -Continue monitor for signs and symptoms of infection -Repeat CBC in a.m.  Hyperglycemia -Patient's blood sugar this morning was 107 on CMP -Checked Hemoglobin A1c and was 4.9. -Continue monitor and trend blood  sugars and if consistently elevated will place on sensitive NovoLog Sliding scale insulin AC  Normocytic Anemia -Patient's Hgb/Hct went from 13.5/40.0 -> 12.0/37.0 -> 11.6/34.4 -> 11.5/34.7 -Likely dilutional drop in the setting of her being hemoconcentrated on admission -Check Anemia panel in the a.m. -Continue monitor for signs and symptoms of bleeding -Repeat CBC in the a.m.  Hypomagnesemia -Patient magnesium level was 1.6 and now 1.9 -Continue to monitor and replete as necessary -Repeat magnesium level in a.m.  Metabolic Acidosis -Mild as CO2 was 20; AG is 10, Now CO2 is 22 and AG is 10 -IVF now D/C'd  -Continue to Monitor and Trend and Repeat CMP in AM   DVT prophylaxis: Enoxaparin 40 mg sq q24h Code Status: FULL CODE  Family Communication: No family present at bedside  Disposition Plan: SNF if patient is agreeable. Did not want to work with Therapy today due to fatigue  Consultants:   None  Procedures:  None   Antimicrobials:  Anti-infectives (From admission, onward)   Start     Dose/Rate Route Frequency Ordered Stop   02/11/19 1600  cefTRIAXone (ROCEPHIN) 1 g in sodium chloride 0.9 % 100 mL IVPB     1 g 200 mL/hr over 30 Minutes Intravenous Every 24 hours 02/10/19 1652  02/10/19 1615  cefTRIAXone (ROCEPHIN) 1 g in sodium chloride 0.9 % 100 mL IVPB  Status:  Discontinued     1 g 200 mL/hr over 30 Minutes Intravenous Every 24 hours 02/10/19 1611 02/10/19 1818     Subjective: Seen and examined at bedside   Objective: Vitals:   02/13/19 0522 02/13/19 1103 02/13/19 1426 02/13/19 1541  BP: (!) 138/97  (!) 167/123   Pulse: 85  (!) 102   Resp: 14  (!) 22   Temp: 98 F (36.7 C)  98.8 F (37.1 C)   TempSrc: Oral     SpO2: 96% 97% 96%   Weight:    58.4 kg    Intake/Output Summary (Last 24 hours) at 02/13/2019 1639 Last data filed at 02/13/2019 0600 Gross per 24 hour  Intake 1210.46 ml  Output 675 ml  Net 535.46 ml   Filed Weights   02/13/19 1541   Weight: 58.4 kg   Examination: Physical Exam:  Constitutional: Thin Caucasian female currently who is withdrawn and wanting to rest and is severely depressed appearing but states that she is feeling a little bit better and not urinating as much. Eyes: Lids and conjunctive are normal.  Sclera anicteric ENMT: External ears and nose appear normal.  Grossly normal hearing Neck: Appears supple no JVD Respiratory: Mildly diminished to auscultation bilaterally no appreciable wheezing, rales, rhonchi.  Patient not tachypneic wheezing and accessory muscles breathe Cardiovascular: No appreciable murmurs, rubs, gallops.  No lower extremity edema Abdomen: Soft, nontender, nondistended.  Bowel sounds present regular rate and rhythm.   GU: Deferred Musculoskeletal: No contractures or cyanosis.  No joint deformities in upper lower extremities Skin: Skin is warm and dry with no appreciable rashes or lesions on to skin evaluation Neurologic: Cranial nerves II through XII grossly intact with no appreciable focal deficit and #cerebellar reflexes were not assessed Psychiatric:.  Withdrawn today and wanted to rest.  Not as anxious but did not appear very depressed and had a flat affect  Data Reviewed: I have personally reviewed following labs and imaging studies  CBC: Recent Labs  Lab 02/10/19 1407 02/11/19 0907 02/12/19 0619 02/13/19 0542  WBC 13.5* 9.6 7.3 7.8  NEUTROABS  --  6.5 3.6 4.9  HGB 13.5 12.0 11.6* 11.5*  HCT 40.0 37.0 34.4* 34.7*  MCV 93.5 98.4 96.4 96.1  PLT 237 218 200 211   Basic Metabolic Panel: Recent Labs  Lab 02/10/19 1407 02/11/19 0907 02/12/19 0619 02/13/19 0542  NA 139 139 140 142  K 3.4* 2.6* 4.3 3.6  CL 107 107 110 110  CO2 21* 22 20* 22  GLUCOSE 95 107* 83 75  BUN 39* 25* 19 10  CREATININE 0.94 0.76 0.61 0.56  CALCIUM 9.3 8.4* 8.4* 8.3*  MG  --  1.7 1.6* 1.9  PHOS  --  1.9* 1.9* 2.4*   GFR: CrCl cannot be calculated (Unknown ideal weight.). Liver Function  Tests: Recent Labs  Lab 02/11/19 0907 02/12/19 0619 02/13/19 0542  AST 52* 39 28  ALT 76* 63* 47*  ALKPHOS 54 48 52  BILITOT 0.7 0.6 0.5  PROT 6.8 6.4* 6.4*  ALBUMIN 3.5 3.2* 3.2*   No results for input(s): LIPASE, AMYLASE in the last 168 hours. No results for input(s): AMMONIA in the last 168 hours. Coagulation Profile: No results for input(s): INR, PROTIME in the last 168 hours. Cardiac Enzymes: No results for input(s): CKTOTAL, CKMB, CKMBINDEX, TROPONINI in the last 168 hours. BNP (last 3 results) No results for input(s):  PROBNP in the last 8760 hours. HbA1C: Recent Labs    02/12/19 0619  HGBA1C 4.9   CBG: Recent Labs  Lab 02/10/19 1334  GLUCAP 90   Lipid Profile: No results for input(s): CHOL, HDL, LDLCALC, TRIG, CHOLHDL, LDLDIRECT in the last 72 hours. Thyroid Function Tests: Recent Labs    02/11/19 0907  TSH 4.762*  FREET4 0.98   Anemia Panel: No results for input(s): VITAMINB12, FOLATE, FERRITIN, TIBC, IRON, RETICCTPCT in the last 72 hours. Sepsis Labs: No results for input(s): PROCALCITON, LATICACIDVEN in the last 168 hours.  Recent Results (from the past 240 hour(s))  Culture, blood (routine x 2)     Status: None (Preliminary result)   Collection Time: 02/11/19  9:07 AM  Result Value Ref Range Status   Specimen Description   Final    BLOOD RIGHT ARM Performed at [facility], [address] [place], [place] [address]    Special Requests   Final    BOTTLES DRAWN AEROBIC AND ANAEROBIC Blood Culture adequate volume Performed at [facility], [address] [place], [place] [address]    Culture   Final    NO GROWTH 2 DAYS Performed at [facility], [address] [place], [place] [address]    Report Status PENDING  Incomplete  Culture, blood (routine x 2)     Status: None (Preliminary result)   Collection Time: 02/11/19  9:07 AM  Result Value Ref Range Status   Specimen Description   Final    BLOOD RIGHT HAND  Performed at [facility], [address] [place], [place] [address]    Special Requests   Final    BOTTLES DRAWN AEROBIC ONLY Blood Culture results may not be optimal due to an inadequate volume of blood received in culture bottles Performed at [facility], [address] [place], [place] [address]    Culture   Final    NO GROWTH 2 DAYS Performed at [facility], [address] [place], [place] [address]    Report Status PENDING  Incomplete  Culture, Urine     Status: None   Collection Time: 02/12/19  6:26 AM  Result Value Ref Range Status   Specimen Description   Final    URINE, RANDOM Performed at [facility], [address] [place], [place] [address]    Special Requests   Final    NONE Performed at [facility], [address] [place], [place] [address]    Culture   Final    NO GROWTH Performed at [facility], [address] [place], [place] [address]    Report Status 02/13/2019 FINAL  Final     RN Pressure Injury Documentation:   Radiology Studies: No results found.  Scheduled Meds: . amitriptyline  50 mg Oral QHS  . carbamazepine  400 mg Oral TID  . cholecalciferol  1,000 Units Oral Daily  . enoxaparin (LOVENOX) injection  40 mg Subcutaneous Q24H  . [START ON 02/14/2019] feeding supplement  1 Container Oral Q24H  . feeding supplement (ENSURE ENLIVE)  237 mL Oral BID BM  . HYDROcodone-acetaminophen  1 tablet Oral Q8H  . levothyroxine  50 mcg Oral Q0600  . metoprolol succinate  25 mg Oral Daily  . multivitamin with minerals  1 tablet Oral Daily  . phosphorus  500 mg Oral BID  . sodium chloride flush  3 mL Intravenous Once   Continuous  Infusions: . cefTRIAXone (ROCEPHIN)  IV Stopped (02/12/19 1732)    LOS: 2 days   Merlene Laughter, DO [facility] PAGER is on AMION  If 7PM-7AM, please contact night-coverage [email or web address] Password [facility] 02/13/2019, 4:39  PM

## 2019-02-13 NOTE — Progress Notes (Signed)
Physical Therapy Treatment Patient Details Name: Melanie Richardson MRN: [id number] DOB: 07/27/1951 Today's Date: 02/13/2019    History of Present Illness Pt admitted with generalized weakness 2* dehydration and UTI.  Pt with hx of MS     PT Comments    Max encouragement from therapist and RN but pt was unwilling to sit up or get OOB with PT.    Follow Up Recommendations  SNF     Equipment Recommendations  (TBD at next venue)    Recommendations for Other Services       Precautions / Restrictions Precautions Precautions: Fall Restrictions Weight Bearing Restrictions: No    Mobility  Bed Mobility Overal bed mobility: Needs Assistance Bed Mobility: Rolling           General bed mobility comments: Pt partially rolled in bed but refused to fully roll over or sit up EOB. MAX encouragement from therapist and RN but pt continued to refuse.   Transfers                    Ambulation/Gait                 Stairs             Wheelchair Mobility    Modified Rankin (Stroke Patients Only)       Balance                                            Cognition Arousal/Alertness: Awake/alert Behavior During Therapy: WFL for tasks assessed/performed Overall Cognitive Status: No family/caregiver present to determine baseline cognitive functioning                                        Exercises      General Comments        Pertinent Vitals/Pain Pain Assessment: No/denies pain    Home Living                      Prior Function            PT Goals (current goals can now be found in the care plan section) Progress towards PT goals: Not progressing toward goals - comment(pt unwillingness to participate)    Frequency    Min 2X/week      PT Plan Frequency needs to be updated    Co-evaluation              AM-PAC PT "6 Clicks" Mobility   Outcome Measure  Help needed turning from your  back to your side while in a flat bed without using bedrails?: A Little Help needed moving from lying on your back to sitting on the side of a flat bed without using bedrails?: A Little Help needed moving to and from a bed to a chair (including a wheelchair)?: A Little Help needed standing up from a chair using your arms (e.g., wheelchair or bedside chair)?: A Lot Help needed to walk in hospital room?: A Lot Help needed climbing 3-5 steps with a railing? : Total 6 Click Score: 14    End of Session   Activity Tolerance: (limited by pt refusal to participate) Patient left: in bed;with call bell/phone within reach;with bed alarm set   PT Visit Diagnosis: Muscle  weakness (generalized) (M62.81);History of falling (Z91.81);Difficulty in walking, not elsewhere classified (R26.2)     Time: 4259-5638 PT Time Calculation (min) (ACUTE ONLY): 23 min  Charges:  $Therapeutic Activity: 8-22 mins                       Rebeca Alert, PT Acute Rehabilitation Services Pager: [phone] Office: [phone]

## 2019-02-14 ENCOUNTER — Inpatient Hospital Stay (HOSPITAL_COMMUNITY): Payer: Medicare Other

## 2019-02-14 DIAGNOSIS — R400 Somnolence: Secondary | ICD-10-CM

## 2019-02-14 LAB — RETICULOCYTES
Immature Retic Fract: 4.6 % (ref 2.3–15.9)
RBC.: 3.49 MIL/uL — ABNORMAL LOW (ref 3.87–5.11)
Retic Count, Absolute: 35.6 10*3/uL (ref 19.0–186.0)
Retic Ct Pct: 1 % (ref 0.4–3.1)

## 2019-02-14 LAB — CBC WITH DIFFERENTIAL/PLATELET
Abs Immature Granulocytes: 0.05 10*3/uL (ref 0.00–0.07)
Basophils Absolute: 0.1 10*3/uL (ref 0.0–0.1)
Basophils Relative: 1 %
Eosinophils Absolute: 0.1 10*3/uL (ref 0.0–0.5)
Eosinophils Relative: 2 %
HCT: 32.4 % — ABNORMAL LOW (ref 36.0–46.0)
Hemoglobin: 11.2 g/dL — ABNORMAL LOW (ref 12.0–15.0)
Immature Granulocytes: 1 %
Lymphocytes Relative: 18 %
Lymphs Abs: 1.6 10*3/uL (ref 0.7–4.0)
MCH: 32.1 pg (ref 26.0–34.0)
MCHC: 34.6 g/dL (ref 30.0–36.0)
MCV: 92.8 fL (ref 80.0–100.0)
Monocytes Absolute: 1.2 10*3/uL — ABNORMAL HIGH (ref 0.1–1.0)
Monocytes Relative: 14 %
Neutro Abs: 5.7 10*3/uL (ref 1.7–7.7)
Neutrophils Relative %: 64 %
Platelets: 216 10*3/uL (ref 150–400)
RBC: 3.49 MIL/uL — ABNORMAL LOW (ref 3.87–5.11)
RDW: 12.8 % (ref 11.5–15.5)
WBC: 8.7 10*3/uL (ref 4.0–10.5)
nRBC: 0 % (ref 0.0–0.2)

## 2019-02-14 LAB — HEPATITIS PANEL, ACUTE
HCV Ab: <0.1 s/co ratio (ref 0.0–0.9)
Hep A IgM: NEGATIVE
Hep B C IgM: NEGATIVE
Hepatitis B Surface Ag: NEGATIVE

## 2019-02-14 LAB — BLOOD GAS, ARTERIAL
Acid-base deficit: 2.9 mmol/L — ABNORMAL HIGH (ref 0.0–2.0)
Bicarbonate: 19.1 mmol/L — ABNORMAL LOW (ref 20.0–28.0)
Drawn by: 257881
O2 Saturation: 96.2 %
Patient temperature: 37
pCO2 arterial: 26.2 mmHg — ABNORMAL LOW (ref 32.0–48.0)
pH, Arterial: 7.477 — ABNORMAL HIGH (ref 7.350–7.450)
pO2, Arterial: 77.3 mmHg — ABNORMAL LOW (ref 83.0–108.0)

## 2019-02-14 LAB — COMPREHENSIVE METABOLIC PANEL
ALT: 43 U/L (ref 0–44)
AST: 33 U/L (ref 15–41)
Albumin: 3.3 g/dL — ABNORMAL LOW (ref 3.5–5.0)
Alkaline Phosphatase: 55 U/L (ref 38–126)
Anion gap: 14 (ref 5–15)
BUN: 9 mg/dL (ref 8–23)
CO2: 20 mmol/L — ABNORMAL LOW (ref 22–32)
Calcium: 8.3 mg/dL — ABNORMAL LOW (ref 8.9–10.3)
Chloride: 104 mmol/L (ref 98–111)
Creatinine, Ser: 0.62 mg/dL (ref 0.44–1.00)
GFR calc Af Amer: >60 mL/min (ref >60)
GFR calc non Af Amer: >60 mL/min (ref >60)
Glucose, Bld: 77 mg/dL (ref 70–99)
Potassium: 3 mmol/L — ABNORMAL LOW (ref 3.5–5.1)
Sodium: 138 mmol/L (ref 135–145)
Total Bilirubin: 0.8 mg/dL (ref 0.3–1.2)
Total Protein: 6.6 g/dL (ref 6.5–8.1)

## 2019-02-14 LAB — VITAMIN B12: Vitamin B-12: 849 pg/mL (ref 180–914)

## 2019-02-14 LAB — IRON AND TIBC
Iron: 27 ug/dL — ABNORMAL LOW (ref 28–170)
Saturation Ratios: 13 % (ref 10.4–31.8)
TIBC: 201 ug/dL — ABNORMAL LOW (ref 250–450)
UIBC: 174 ug/dL

## 2019-02-14 LAB — PHOSPHORUS: Phosphorus: 2.5 mg/dL (ref 2.5–4.6)

## 2019-02-14 LAB — FERRITIN: Ferritin: 145 ng/mL (ref 11–307)

## 2019-02-14 LAB — CARBAMAZEPINE LEVEL, TOTAL: Carbamazepine Lvl: 8.7 ug/mL (ref 4.0–12.0)

## 2019-02-14 LAB — AMMONIA: Ammonia: 24 umol/L (ref 9–35)

## 2019-02-14 LAB — MAGNESIUM: Magnesium: 1.6 mg/dL — ABNORMAL LOW (ref 1.7–2.4)

## 2019-02-14 LAB — FOLATE: Folate: 7.3 ng/mL (ref >5.9)

## 2019-02-14 MED ORDER — K PHOS MONO-SOD PHOS DI & MONO 155-852-130 MG PO TABS
500.0000 mg | ORAL_TABLET | Freq: Two times a day (BID) | ORAL | Status: DC
  Administered 2019-02-14: 500 mg via ORAL
  Filled 2019-02-14 (×2): qty 2

## 2019-02-14 MED ORDER — MAGNESIUM SULFATE 2 GM/50ML IV SOLN
2.0000 g | Freq: Once | INTRAVENOUS | Status: AC
  Administered 2019-02-14: 2 g via INTRAVENOUS
  Filled 2019-02-14: qty 50

## 2019-02-14 MED ORDER — POTASSIUM CHLORIDE 10 MEQ/100ML IV SOLN
10.0000 meq | INTRAVENOUS | Status: AC
  Administered 2019-02-14 (×2): 10 meq via INTRAVENOUS
  Filled 2019-02-14 (×2): qty 100

## 2019-02-14 MED ORDER — HYDROCODONE-ACETAMINOPHEN 7.5-325 MG PO TABS
1.0000 | ORAL_TABLET | Freq: Three times a day (TID) | ORAL | Status: DC | PRN
  Administered 2019-02-14 – 2019-02-20 (×8): 1 via ORAL
  Filled 2019-02-14 (×8): qty 1

## 2019-02-14 MED ORDER — POTASSIUM CHLORIDE 10 MEQ/100ML IV SOLN
10.0000 meq | INTRAVENOUS | Status: AC
  Administered 2019-02-14 (×3): 10 meq via INTRAVENOUS
  Filled 2019-02-14 (×3): qty 100

## 2019-02-14 MED ORDER — POTASSIUM CHLORIDE CRYS ER 20 MEQ PO TBCR
40.0000 meq | EXTENDED_RELEASE_TABLET | Freq: Two times a day (BID) | ORAL | Status: AC
  Administered 2019-02-14: 40 meq via ORAL
  Filled 2019-02-14 (×2): qty 2

## 2019-02-14 MED ORDER — POTASSIUM PHOSPHATES 15 MMOLE/5ML IV SOLN
20.0000 mmol | Freq: Once | INTRAVENOUS | Status: AC
  Administered 2019-02-14: 20 mmol via INTRAVENOUS
  Filled 2019-02-14: qty 6.67

## 2019-02-14 MED ORDER — CARBAMAZEPINE ER 200 MG PO TB12
200.0000 mg | ORAL_TABLET | Freq: Three times a day (TID) | ORAL | Status: DC
  Administered 2019-02-14 – 2019-02-20 (×18): 200 mg via ORAL
  Filled 2019-02-14 (×21): qty 1

## 2019-02-14 NOTE — Progress Notes (Signed)
PROGRESS NOTE    Melanie Richardson  XBW:[id number] DOB: 10-Sep-1951 DOA: 02/10/2019 PCP: Salley Scarlet, MD   Brief Narrative:  HPI per Dr. Jerelene Redden on 02/10/2019 Melanie Richardson is a 68 y.o. female with medical history significant of multiple sclerosis came in with complaints of generalized weakness.  She has been weak over the last 1 week with increasing episodes of urinary incontinence which is unusual for her.  She denies any burning urination.  She denies any fever chest pain shortness of breath cough nausea vomiting diarrhea abdominal pain.  At baseline she walks with a walker.  However she has a wheelchair at home for longer distances.  She has aides checking on her several times during the day but she lives alone.  Her appetite decreased p.o. intake and dizziness upon standing.  **Interim History  Worked with physical therapy and they recommending skilled nursing facility. Discussed with the patient about going to a skilled nursing facility and she does not know if she can or not and did not participate with physical therapy yesterday despite maximum encouragement. Today she is somnolent and extremely drowsy and somewhat difficult to arouse so will hold sedating medications and obtain head CT and ABG. Obtaining urine culture as well as blood cultures and will continue IV fluids given her weakness but at a reduced rate and will stop later today.  Assessment & Plan:   Active Problems:   MS (multiple sclerosis) (HCC)   Trigeminal neuralgia   Hypothyroidism   Weakness   Anxiety state   Hypertension   Dehydration   UTI (urinary tract infection)   Hypokalemia   Hyperphosphatemia   Abnormal LFTs   Leukocytosis   Hyperglycemia   Normocytic anemia  Generalized Weakness Secondary to Dehydration from poor po intake along with likely concomitant UTI  -Changed patient to Inpatient Status given continued profound weakness from Dehydration requiring Maintenance IVF at 100 mL/hr,  Significant Hypokalemia requiring aggressive Repletion and suspected UTI requiring IV Abx with Ceftriaxone; Urine Cx is showed no growth currently but still will continue Abx for 5 Days  -Has had a decreased po intake and dizziness on standing; Will consult Nutrition and continue to Nutritional Supplements with Ensure Enlive 237 mL BID; nutritionist is working remotely and have added boost breeze -PT Evaluated and recommending SNF and will obtain Child psychotherapist consult.  Patient was not willing to work with physical therapy yesterday -Hypokalemia likely contributing and aggressively repleting; K+ was again low this AM -IVF has now stopped  -Urinalysis done on Admission and showed easy appearance with small hemoglobin, 20 ketones, negative nitrites, rare bacteria, 11-20 WBCs, 0-5 RBCs per high-power field, negative leukocytes -Urine culture was never drawn admission so we will be obtaining one after Antibiotics have been hung  -Check blood cultures x2 as well given her weakness to make sure there is not a bacteremia; Blood Cultures x2 showed no growth to date at 3 days -She was found to have frequent urination and generalized weakness along with a leukocytosis.  Likely due to dehydration could have caused a increased Leukocytosis but could also have been from a urinary tract infection; urinary frequency is improving per patient -Continue with IV antibiotics and repeat orthostatic vital signs -Orthostatics currently not done yesterday as patient was wanting to rest and not awake enough today  -Continue to monitor WBC and temperature curve and continue IV fluid hydration for now at a reduced rate  -Recommend OOB and Up in Chair when she is awake -Will  hold Sedating medications and have discontinued Amitriptyline qHS and changed scheduled Opioids to prn; Lorazepam is PRN as well -Repeat CBC in AM  Somnolence/Drowsines/Lethargy -Patient was very difficult to arouse this morning and she was just mumbling  and and not very responsive and resting. -We will obtain ABG; ABG showed pH of 7.477, PCO2 of 26.2, PO2 77.3, bicarbonate level of 19.1, O2 saturation of 96.2% -Question if this is medication induced so we will hold amitriptyline and change scheduled oxycodone to as needed -TSH as below -Blood Cx As Above -Check RPR and vitamin B12 was normal at 849 -Check head CT without contrast -Check Carbamazepine Free and Total Level  -Continue monitor and if still persists will need a neurology consultation -I discussed the case with [name] of Neurology who recommends decreasing carbamazepine level as well as holding scheduled narcotics.  If patient does not improve will have neurology formally evaluate tomorrow  Suspected UTI -Urinalysis done on Admission and showed easy appearance with small hemoglobin, 20 ketones, negative nitrites, rare bacteria, 11-20 WBCs, 0-5 RBCs per high-power field, negative leukocytes -Urine culture was never drawn admission but obtained after being on Abx; Urine Cx showed no growth -Check blood cultures x2 as well given her weakness to make sure there is not a bacteremia; Blood Cx x 2 showed no growth to date at 3 days -C/w Empiric IV Ceftriaxone; Has been on 4 Days and will stop tomorrow   History of MS  Trigeminal Neuralgia  -D/C'd Amitriptyline 50 mg po qHS,  -C/w Carbamazepine 400 mg po TID and check Carbamazepine Level  -Caution with Diazepam 5 mg po q12hprn along with Muscle Rub Cream   Hypertension -Currently Holding Losartan and HCTZ -C/w Metoprolol XL 25 mg po Daily  -BP was 142/111 -Started IV Hydralazine 10 mg q6hprn for SBP>160 or DBP>100  Osteopenia -C/w Cholecalciferol 1,000 units po Daily   Hypothyroidsm  -TSH was 4.762 and Free T4 was 0.98; T3 was 58 -Continue Levothyroxine 50 mcg po Daily   Hypokalemia, improved  -Patient's K+ was 2.6 and now is 3.0 -Replete with po KCl 40 mEQ BID x2 doses and IV KCl 20 mEQ -Continue to Monitor  and Replete as Necessary -Repeat CMP in AM  Hypophosphatemia -Patient's Phos Level this AM was 2.5 -Replete with po K-Phos Neutral 500 mg BID x2 Doses again today -Continue to Monitor and Replete as Necessary -Repeat Phos Level in AM   Hypomagnesemia -Patient's Mag Level was 1.6 this AM -Replete with IV Mag Sulfate 2 grams -Continue to Monitor and Replete as Necessary -Repeat Mag Level in AM   Abnormal LFTs, improved -Unclear etiology at this point -AST was 52 and ALT was 76 and has started improving as AST is 33 and ALT is 43 -Checked right upper quadrant ultrasound as well as an acute hepatitis panel -Acute Hepatitis Panel Negative and RUQ U/S showed No acute sonographic abnormality detected. No sonographic evidence for cholelithiasis. -Continue monitor trend Hepatic function panel -Repeat CMP in a.m.  Leukocytosis, improved  -Likely in the setting of infection versus dehydration were combination of both -WBC went from 13.5 -> 9.6 -> 7.3 -> 7.8 -> 8.7 -Continue with empiric antibiotics as above for now; She states Urinary Symptoms and frequency is decreasing  -Continue monitor for signs and symptoms of infection -Repeat CBC in a.m.  Hyperglycemia -Patient's blood sugar this morning was 107 on CMP -Checked Hemoglobin A1c and was 4.9. -Continue monitor and trend blood sugars and if consistently elevated will place on sensitive  NovoLog Sliding scale insulin AC  Normocytic Anemia -Patient's Hgb/Hct went from 13.5/40.0 -> 12.0/37.0 -> 11.6/34.4 -> 11.5/34.7 -Likely dilutional drop in the setting of her being hemoconcentrated on admission -Checked Anemia panel and iron level of 27, U IBC was 174, TIBC was 201, saturation ratios were 13%, ferritin level is 145, folate level 7.3, and vitamin B12 level is 849 -Continue monitor for signs and symptoms of bleeding -Repeat CBC in the a.m.  Metabolic Acidosis -Mild as CO2 is 20; AG is 14 -IVF now D/C'd  -Continue to Monitor and  Trend and Repeat CMP in AM   DVT prophylaxis: Enoxaparin 40 mg sq q24h Code Status: FULL CODE  Family Communication: No family present at bedside  Disposition Plan: SNF if patient is agreeable. Did not want to work with Therapy today due to fatigue  Consultants:   None I discussed the case with Neurology [name]  Procedures:  None   Antimicrobials:  Anti-infectives (From admission, onward)   Start     Dose/Rate Route Frequency Ordered Stop   02/11/19 1600  cefTRIAXone (ROCEPHIN) 1 g in sodium chloride 0.9 % 100 mL IVPB     1 g 200 mL/hr over 30 Minutes Intravenous Every 24 hours 02/10/19 1652     02/10/19 1615  cefTRIAXone (ROCEPHIN) 1 g in sodium chloride 0.9 % 100 mL IVPB  Status:  Discontinued     1 g 200 mL/hr over 30 Minutes Intravenous Every 24 hours 02/10/19 1611 02/10/19 1818     Subjective: Seen and examined at bedside she was somnolent and drowsy today and very difficult to arouse.  She would just fall right back to sleep.  I asked the nurse if this was accurate as morning and the nurse said that she was talking some but now she just wants to rest.   Objective: Vitals:   02/13/19 2309 02/14/19 0253 02/14/19 0555 02/14/19 1338  BP: 130/68 116/74 124/83 (!) 142/111  Pulse: (!) 103 82 (!) 105 (!) 105  Resp: Temp: 99.4 F (37.4 C) 99 F (37.2 C) 98.7 F (37.1 C) 98.9 F (37.2 C)  TempSrc: Oral Oral Oral Oral  SpO2: 97% 98% 98% 98%  Weight:        Intake/Output Summary (Last 24 hours) at 02/14/2019 1427 Last data filed at 02/14/2019 0700 Gross per 24 hour  Intake 100.06 ml  Output 1250 ml  Net -1149.94 ml   Filed Weights   02/13/19 1541  Weight: 58.4 kg   Examination: Physical Exam:  Constitutional: Thin Caucasian female currently no acute distress and she is somnolent and drowsy Eyes: Lids and conjunctive are normal.  Had her eyes closed throughout most of the encounter ENMT: External ears nose appear normal.  Grossly normal  hearing Neck: Appears supple no JVD Respiratory: Diminished auscultation bilaterally no appreciable wheezing, rales, rhonchi.  Patient not tachypneic wheezing and accessory muscles to breathe Cardiovascular: Slightly tachycardic rate but regular rhythm.  No lower extremity edema noted Abdomen: Soft, nontender, nondistended.  Bowel sounds present GU: Deferred Musculoskeletal: No contractures or cyanosis.  No joint deformities Skin: Skin is warm dry no appreciable rashes or lesions on to skin evaluation Neurologic: Was not awake enough to participate in neurological examination Psychiatric: Remains very drowsy and sleepy and moderate some words here and there.  Data Reviewed: I have personally reviewed following labs and imaging studies  CBC: Recent Labs  Lab 02/10/19 1407 02/11/19 0907 02/12/19 0619 02/13/19 0542 02/14/19 0526  WBC 13.5*  9.6 7.3 7.8 8.7  NEUTROABS  --  6.5 3.6 4.9 5.7  HGB 13.5 12.0 11.6* 11.5* 11.2*  HCT 40.0 37.0 34.4* 34.7* 32.4*  MCV 93.5 98.4 96.4 96.1 92.8  PLT 237 218 200 211 216   Basic Metabolic Panel: Recent Labs  Lab 02/10/19 1407 02/11/19 0907 02/12/19 0619 02/13/19 0542 02/14/19 0526  NA 139 139 140 142 138  K 3.4* 2.6* 4.3 3.6 3.0*  CL 107 107 110 110 104  CO2 21* 22 20* 22 20*  GLUCOSE 95 107* 83 75 77  BUN 39* 25* 19 10 9   CREATININE 0.94 0.76 0.61 0.56 0.62  CALCIUM 9.3 8.4* 8.4* 8.3* 8.3*  MG  --  1.7 1.6* 1.9 1.6*  PHOS  --  1.9* 1.9* 2.4* 2.5   GFR: CrCl cannot be calculated (Unknown ideal weight.). Liver Function Tests: Recent Labs  Lab 02/11/19 0907 02/12/19 0619 02/13/19 0542 02/14/19 0526  AST 52* 39 28 33  ALT 76* 63* 47* 43  ALKPHOS 54 48 52 55  BILITOT 0.7 0.6 0.5 0.8  PROT 6.8 6.4* 6.4* 6.6  ALBUMIN 3.5 3.2* 3.2* 3.3*   No results for input(s): LIPASE, AMYLASE in the last 168 hours. No results for input(s): AMMONIA in the last 168 hours. Coagulation Profile: No results for input(s): INR, PROTIME in the last  168 hours. Cardiac Enzymes: No results for input(s): CKTOTAL, CKMB, CKMBINDEX, TROPONINI in the last 168 hours. BNP (last 3 results) No results for input(s): PROBNP in the last 8760 hours. HbA1C: Recent Labs    02/12/19 0619  HGBA1C 4.9   CBG: Recent Labs  Lab 02/10/19 1334  GLUCAP 90   Lipid Profile: No results for input(s): CHOL, HDL, LDLCALC, TRIG, CHOLHDL, LDLDIRECT in the last 72 hours. Thyroid Function Tests: No results for input(s): TSH, T4TOTAL, FREET4, T3FREE, THYROIDAB in the last 72 hours. Anemia Panel: Recent Labs    02/14/19 0526  VITAMINB12 849  FOLATE 7.3  FERRITIN 145  TIBC 201*  IRON 27*  RETICCTPCT 1.0   Sepsis Labs: No results for input(s): PROCALCITON, LATICACIDVEN in the last 168 hours.  Recent Results (from the past 240 hour(s))  Culture, blood (routine x 2)     Status: None (Preliminary result)   Collection Time: 02/11/19  9:07 AM  Result Value Ref Range Status   Specimen Description   Final    BLOOD RIGHT ARM Performed at [facility], [address] [place], [place] [address]    Special Requests   Final    BOTTLES DRAWN AEROBIC AND ANAEROBIC Blood Culture adequate volume Performed at [facility], [address] [place], [place] [address]    Culture   Final    NO GROWTH 3 DAYS Performed at [facility], [address] [place], [place] [address]    Report Status PENDING  Incomplete  Culture, blood (routine x 2)     Status: None (Preliminary result)   Collection Time: 02/11/19  9:07 AM  Result Value Ref Range Status   Specimen Description   Final    BLOOD RIGHT HAND Performed at [facility], [address] [place], [place] [address]    Special Requests   Final    BOTTLES DRAWN AEROBIC ONLY Blood Culture results may not be optimal due to an inadequate volume of blood received in culture bottles Performed at [facility], [address] [place], [place]  [address]    Culture   Final  NO GROWTH 3 DAYS Performed at [facility], [address], [place], [place] [address]    Report Status PENDING  Incomplete  Culture, Urine     Status: None   Collection Time: 02/12/19  6:26 AM  Result Value Ref Range Status   Specimen Description   Final    URINE, RANDOM Performed at [facility], [address], [place], [place] [address]    Special Requests   Final    NONE Performed at [facility], [address], [place], [place] [address]    Culture   Final    NO GROWTH Performed at [facility], [address], [place], [place] [address]    Report Status 02/13/2019 FINAL  Final     RN Pressure Injury Documentation:   Radiology Studies: No results found.  Scheduled Meds: . carbamazepine  400 mg Oral TID  . cholecalciferol  1,000 Units Oral Daily  . enoxaparin (LOVENOX) injection  40 mg Subcutaneous Q24H  . feeding supplement  1 Container Oral Q24H  . feeding supplement (ENSURE ENLIVE)  237 mL Oral BID BM  . levothyroxine  50 mcg Oral Q0600  . metoprolol succinate  25 mg Oral Daily  . multivitamin with minerals  1 tablet Oral Daily  . potassium chloride  40 mEq Oral BID  . sodium chloride flush  3 mL Intravenous Once   Continuous Infusions: . cefTRIAXone (ROCEPHIN)  IV Stopped (02/13/19 1716)    LOS: 3 days   Merlene Laughter, DO [facility] PAGER is on AMION  If 7PM-7AM, please contact night-coverage [email or web address] Password [facility] 02/14/2019, 2:27 PM

## 2019-02-14 NOTE — TOC Progression Note (Signed)
Transition of Care [facility]) - Progression Note    Patient Details  Name: Melanie Richardson MRN: [id number] Date of Birth: 08-Jan-1951  [facility]) CM/SW Contact  Golda Acre, RN Phone Number: 02/14/2019, 3:19 PM  Clinical Narrative:    Spoke to patient concerning snf placement for rehab.  States at this time she will have to think about this.  No ready to make a decision.  Cm will check back later today or in the am for decision.        Expected Discharge Plan and Services                                                 Social Determinants of Health (SDOH) Interventions    Readmission Risk Interventions No flowsheet data found.

## 2019-02-14 NOTE — Progress Notes (Signed)
Nurse has been unable to get patient to take oral meds after numerous attempts. Patient states that she needs a minute. Patient is moaning and when asked is she in pain patient states she is not in pain , she just doesn't feel good but is unable to verbalize specific symptoms.

## 2019-02-14 NOTE — Care Management Important Message (Signed)
Important Message  Patient Details IM Letter given to Marcelle Smiling Rn to present to the Patient Name: KORIN AUCH MRN: [id number] Date of Birth: 18-Feb-1951   Medicare Important Message Given:  Yes    Caren Macadam 02/14/2019, 11:01 AMImportant Message  Patient Details  Name: NIYLA ZAMOR MRN: [id number] Date of Birth: [birth year]   Medicare Important Message Given:  Yes    Caren Macadam 02/14/2019, 11:01 AM

## 2019-02-15 LAB — CBC WITH DIFFERENTIAL/PLATELET
Abs Immature Granulocytes: 0.05 10*3/uL (ref 0.00–0.07)
Basophils Absolute: 0.1 10*3/uL (ref 0.0–0.1)
Basophils Relative: 1 %
Eosinophils Absolute: 0.2 10*3/uL (ref 0.0–0.5)
Eosinophils Relative: 2 %
HCT: 32.7 % — ABNORMAL LOW (ref 36.0–46.0)
Hemoglobin: 11 g/dL — ABNORMAL LOW (ref 12.0–15.0)
Immature Granulocytes: 1 %
Lymphocytes Relative: 19 %
Lymphs Abs: 1.8 10*3/uL (ref 0.7–4.0)
MCH: 31.2 pg (ref 26.0–34.0)
MCHC: 33.6 g/dL (ref 30.0–36.0)
MCV: 92.6 fL (ref 80.0–100.0)
Monocytes Absolute: 1.2 10*3/uL — ABNORMAL HIGH (ref 0.1–1.0)
Monocytes Relative: 13 %
Neutro Abs: 5.9 10*3/uL (ref 1.7–7.7)
Neutrophils Relative %: 64 %
Platelets: 225 10*3/uL (ref 150–400)
RBC: 3.53 MIL/uL — ABNORMAL LOW (ref 3.87–5.11)
RDW: 12.8 % (ref 11.5–15.5)
WBC: 9.3 10*3/uL (ref 4.0–10.5)
nRBC: 0 % (ref 0.0–0.2)

## 2019-02-15 LAB — COMPREHENSIVE METABOLIC PANEL
ALT: 38 U/L (ref 0–44)
AST: 30 U/L (ref 15–41)
Albumin: 3.3 g/dL — ABNORMAL LOW (ref 3.5–5.0)
Alkaline Phosphatase: 52 U/L (ref 38–126)
Anion gap: 11 (ref 5–15)
BUN: 9 mg/dL (ref 8–23)
CO2: 20 mmol/L — ABNORMAL LOW (ref 22–32)
Calcium: 8.3 mg/dL — ABNORMAL LOW (ref 8.9–10.3)
Chloride: 104 mmol/L (ref 98–111)
Creatinine, Ser: 0.52 mg/dL (ref 0.44–1.00)
GFR calc Af Amer: >60 mL/min (ref >60)
GFR calc non Af Amer: >60 mL/min (ref >60)
Glucose, Bld: 84 mg/dL (ref 70–99)
Potassium: 4 mmol/L (ref 3.5–5.1)
Sodium: 135 mmol/L (ref 135–145)
Total Bilirubin: 0.9 mg/dL (ref 0.3–1.2)
Total Protein: 6.5 g/dL (ref 6.5–8.1)

## 2019-02-15 LAB — RPR: RPR Ser Ql: NONREACTIVE

## 2019-02-15 LAB — PHOSPHORUS: Phosphorus: 2.2 mg/dL — ABNORMAL LOW (ref 2.5–4.6)

## 2019-02-15 LAB — HEPATITIS PANEL, ACUTE
HCV Ab: <0.1 s/co ratio (ref 0.0–0.9)
Hep A IgM: NEGATIVE
Hep B C IgM: NEGATIVE
Hepatitis B Surface Ag: NEGATIVE

## 2019-02-15 LAB — MAGNESIUM: Magnesium: 1.6 mg/dL — ABNORMAL LOW (ref 1.7–2.4)

## 2019-02-15 LAB — CARBAMAZEPINE, FREE AND TOTAL
Carbamazepine, Free: 2.2 ug/mL (ref 0.6–4.2)
Carbamazepine, Total: 9.1 ug/mL (ref 4.0–12.0)

## 2019-02-15 MED ORDER — POTASSIUM PHOSPHATES 15 MMOLE/5ML IV SOLN
20.0000 mmol | Freq: Once | INTRAVENOUS | Status: AC
  Administered 2019-02-15: 20 mmol via INTRAVENOUS
  Filled 2019-02-15: qty 6.67

## 2019-02-15 MED ORDER — SODIUM CHLORIDE 0.9% FLUSH
10.0000 mL | INTRAVENOUS | Status: DC | PRN
  Administered 2019-02-18: 10 mL
  Filled 2019-02-15: qty 40

## 2019-02-15 MED ORDER — MAGNESIUM SULFATE 2 GM/50ML IV SOLN
2.0000 g | Freq: Once | INTRAVENOUS | Status: AC
  Administered 2019-02-15: 2 g via INTRAVENOUS
  Filled 2019-02-15: qty 50

## 2019-02-15 MED ORDER — SODIUM CHLORIDE 0.9% FLUSH
10.0000 mL | Freq: Two times a day (BID) | INTRAVENOUS | Status: DC
  Administered 2019-02-15 – 2019-02-18 (×5): 10 mL

## 2019-02-15 NOTE — Progress Notes (Signed)
Physical Therapy Treatment Patient Details Name: Melanie Richardson MRN: [id number] DOB: [birth year] Today's Date: 02/15/2019    History of Present Illness Pt admitted with generalized weakness 2* dehydration and UTI.  Pt with hx of MS     PT Comments    Pt is not cooperative and was not willing to get OOB.  I offered the bathroom.  Pt stated she needed to have BM.  Attempted to assist to EOB pt began to resist stated "just let me sh*% in the bed".  Pt also stated "It's over for me".  Pt required MAX encouragement and was instructed she needed to get to bathroom if she needed to move her bowels.  General bed mobility comments: Pt was not willing and required + 2 assist.  Pt stated she needed to have a BM, therapist encouraged OOB to use bathroom.  Pt resistant but eventually complied.  Sit to supine, but was self able.  General transfer comment: + 2 side by side hand held assist with 75% VC's for safety with turns as pt attempted to sit prior to reaching toilet.  Increased anxiety.  Near panic.  General Gait Details: + 2 side by side assist due to anxiety/fear of falling.  Amb from bed to bathroom.  Unsteady.  Then again from bathroom back to bed as pt declined to sit in recliner.   Follow Up Recommendations  SNF(pt lives home alone)     Equipment Recommendations       Recommendations for Other Services       Precautions / Restrictions Precautions Precautions: Fall Precaution Comments: MS but can amb short distances  Restrictions Weight Bearing Restrictions: No    Mobility  Bed Mobility Overal bed mobility: Needs Assistance       Supine to sit: Total assist;+2 for physical assistance;+2 for safety/equipment Sit to supine: Modified independent (Device/Increase time)   General bed mobility comments: Pt was not willing and required + 2 assist.  Pt stated she needed to have a BM, therapist encouraged OOB to use bathroom.  Pt resistant but eventually complied.  Sit to supine, but was  self able.    Transfers Overall transfer level: Needs assistance Equipment used: None Transfers: Stand Pivot Transfers;Sit to/from Stand Sit to Stand: +2 physical assistance;Max assist Stand pivot transfers: +2 physical assistance;Max assist       General transfer comment: + 2 side by side hand held assist with 75% VC's for safety with turns as pt attempted to sit prior to reaching toilet.  Increased anxiety.  Near panic.    Ambulation/Gait Ambulation/Gait assistance: Mod assist;Max assist;+2 physical assistance;+2 safety/equipment Gait Distance (Feet): 20 Feet Assistive device: None   Gait velocity: decreased    General Gait Details: + 2 side by side assist due to anxiety/fear of falling.  Amb from bed to bathroom.  Unsteady.     Stairs             Wheelchair Mobility    Modified Rankin (Stroke Patients Only)       Balance                                            Cognition Arousal/Alertness: Awake/alert Behavior During Therapy: Anxious  General Comments: AxO x 4, not willing, not interested, fearful of falling,       Exercises      General Comments        Pertinent Vitals/Pain Pain Assessment: No/denies pain    Home Living                      Prior Function            PT Goals (current goals can now be found in the care plan section) Progress towards PT goals: Progressing toward goals    Frequency    Min 2X/week      PT Plan Frequency needs to be updated    Co-evaluation              AM-PAC PT "6 Clicks" Mobility   Outcome Measure  Help needed turning from your back to your side while in a flat bed without using bedrails?: A Lot Help needed moving from lying on your back to sitting on the side of a flat bed without using bedrails?: A Lot Help needed moving to and from a bed to a chair (including a wheelchair)?: A Lot Help needed standing up from a  chair using your arms (e.g., wheelchair or bedside chair)?: A Lot Help needed to walk in hospital room?: A Lot Help needed climbing 3-5 steps with a railing? : Total 6 Click Score: 11    End of Session Equipment Utilized During Treatment: Gait belt Activity Tolerance: Other (comment)(high anxiety) Patient left: in bed;with call bell/phone within reach;with bed alarm set Nurse Communication: Mobility status PT Visit Diagnosis: Muscle weakness (generalized) (M62.81);History of falling (Z91.81);Difficulty in walking, not elsewhere classified (R26.2)     Time: 1015-1040 PT Time Calculation (min) (ACUTE ONLY): 25 min  Charges:  $Therapeutic Activity: 8-22 mins                     Felecia Shelling  PTA Acute  Rehabilitation Services Pager      [phone] Office      [phone]

## 2019-02-15 NOTE — TOC Progression Note (Addendum)
Transition of Care [facility]) - Progression Note    Patient Details  Name: Melanie Richardson MRN: [id number] Date of Birth: June 14, 1951  [facility]) CM/SW Contact  Golda Acre, RN Phone Number: 02/15/2019, 11:37 AM  Clinical Narrative:    SNF authorization started Snt authorizathion completed/family has chosen [facility] for services/tct-[facility] they do have a bed available.       Expected Discharge Plan and Services                                                 Social Determinants of Health (SDOH) Interventions    Readmission Risk Interventions No flowsheet data found.

## 2019-02-15 NOTE — TOC Progression Note (Signed)
Transition of Care [facility]) - Progression Note    Patient Details  Name: Melanie Richardson MRN: [id number] Date of Birth: Sep 05, 1951  [facility] Care [facility]) CM/SW Contact  Armanda Heritage, RN Phone Number: 02/15/2019, 3:40 PM  Clinical Narrative:  Received call from son, [name]. Son, [name], states will accept bed at [facility].          Expected Discharge Plan and Services                                                 Social Determinants of Health (SDOH) Interventions    Readmission Risk Interventions No flowsheet data found.

## 2019-02-15 NOTE — Progress Notes (Signed)
PROGRESS NOTE    Melanie Richardson  ZOX:[id number] DOB: 1951-08-15 DOA: 02/10/2019 PCP: Salley Scarlet, MD   Brief Narrative:  HPI per Dr. Jerelene Redden on 02/10/2019 Melanie Richardson is a 68 y.o. female with medical history significant of multiple sclerosis came in with complaints of generalized weakness.  She has been weak over the last 1 week with increasing episodes of urinary incontinence which is unusual for her.  She denies any burning urination.  She denies any fever chest pain shortness of breath cough nausea vomiting diarrhea abdominal pain.  At baseline she walks with a walker.  However she has a wheelchair at home for longer distances.  She has aides checking on her several times during the day but she lives alone.  Her appetite decreased p.o. intake and dizziness upon standing.  **Interim History  Worked with physical therapy and they recommending skilled nursing facility. Discussed with the patient about going to a skilled nursing facility and she does not know if she can or not and did not participate with physical therapy yesterday despite maximum encouragement. Today she is somnolent and extremely drowsy and somewhat difficult to arouse so will hold sedating medications and obtain head CT and ABG. Obtaining urine culture as well as blood cultures and will continue IV fluids given her weakness but at a reduced rate and will stop later today.  Assessment & Plan:   Active Problems:   MS (multiple sclerosis) (HCC)   Trigeminal neuralgia   Hypothyroidism   Weakness   Anxiety state   Hypertension   Dehydration   UTI (urinary tract infection)   Hypokalemia   Hyperphosphatemia   Abnormal LFTs   Leukocytosis   Hyperglycemia   Normocytic anemia  Generalized Weakness Secondary to Dehydration from poor po intake along with likely concomitant UTI  -Changed patient to Inpatient Status given continued profound weakness from Dehydration requiring Maintenance IVF at 100 mL/hr,  Significant Hypokalemia requiring aggressive Repletion and suspected UTI requiring IV Abx with Ceftriaxone; Urine Cx is showed no growth currently but still will continue Abx for 5 Days and this has now stopped  -Has had a decreased po intake and dizziness on standing; Will consult Nutrition and continue to Nutritional Supplements with Ensure Enlive 237 mL BID; nutritionist is working remotely and have added boost breeze -PT Evaluated and recommending SNF and will obtain Child psychotherapist consult.  Patient and family agreeable to SNF -Hypokalemia likely contributing and aggressively repleting; K+ was again low yesterday AM but improved today -IVF has now stopped  -Urinalysis done on Admission and showed Hazy Appearance with small hemoglobin, 20 ketones, negative nitrites, rare bacteria, 11-20 WBCs, 0-5 RBCs per high-power field, negative leukocytes -Urine culture was never drawn admission so we will be obtaining one after Antibiotics have been hung  -Check blood cultures x2 as well given her weakness to make sure there is not a bacteremia; Blood Cultures x2 showed no growth to date at 4 days -She was found to have frequent urination and generalized weakness along with a leukocytosis.  Likely due to dehydration could have caused a increased Leukocytosis but could also have been from a urinary tract infection; urinary frequency is improving per patient -Continue with IV antibiotics and repeat orthostatic vital signs -Will need Repeat Orthostatics  -Continue to monitor WBC and temperature curve and continue IV fluid hydration for now at a reduced rate  -Recommend OOB and Up in Chair when she is awake -Will hold Sedating medications and have discontinued Amitriptyline qHS  and changed scheduled Opioids to prn; Lorazepam is PRN as well -Repeat CBC in AM  Somnolence/Drowsines/Lethargy, improved -Patient was very difficult to arouse yesterday morning and she was just mumbling and and not very responsive and  resting. -We will obtain ABG; ABG showed pH of 7.477, PCO2 of 26.2, PO2 77.3, bicarbonate level of 19.1, O2 saturation of 96.2% -Question if this is medication induced so we will hold amitriptyline and change scheduled oxycodone to as needed -TSH as below -Blood Cx As Above -Checked RPR and was Non-Reactive and vitamin B12 was normal at 849 -Check head CT without contrast done and showed "No acute intracranial pathology. Extensive periventricular and deep white matter hypodensity, in keeping with reported history of multiple sclerosis and or small-vessel white matter disease." -Check Carbamazepine Free 2.2 and Total Level was 9.9  -Continue monitor and if still persists will need a Neurology consultation -I discussed the case with Dr. Amada JupiterKirkpatrick of Neurology who recommends decreasing carbamazepine level as well as holding scheduled narcotics.  -Patient has improved   Suspected UTI -Urinalysis done on Admission and showed easy appearance with small hemoglobin, 20 ketones, negative nitrites, rare bacteria, 11-20 WBCs, 0-5 RBCs per high-power field, negative leukocytes -Urine culture was never drawn admission but obtained after being on Abx; Urine Cx showed no growth -Check blood cultures x2 as well given her weakness to make sure there is not a bacteremia; Blood Cx x 2 showed no growth to date at 4 days -C/w Empiric IV Ceftriaxone; Has been on 5 Days and will stop now  History of MS  Trigeminal Neuralgia  -D/C'd Amitriptyline 50 mg po qHS,  -Changed Carbamazepine 400 mg po TID to 200 mg po TID and checked Carbamazepine Level as above  -Caution with Diazepam 5 mg po q12hprn along with Muscle Rub Cream  -Will need outpatient Follow up with Dr. Despina Ariasichard Sater   Hypertension -Currently Holding Losartan and HCTZ -C/w Metoprolol XL 25 mg po Daily  -BP was 142/111 -Started IV Hydralazine 10 mg q6hprn for SBP>160 or DBP>100  Osteopenia -C/w Cholecalciferol 1,000 units po Daily    Hypothyroidsm  -TSH was 4.762 and Free T4 was 0.98; T3 was 58 -Continue Levothyroxine 50 mcg po Daily  -Repeat BP this AM was 134/99  Hypokalemia, improved  -Patient's K+ is now 4.0 -Continue to Monitor and Replete as Necessary -Repeat CMP in AM  Hypophosphatemia -Patient's Phos Level this AM was 2.2 -Replete with IV K Phos 20 mmol  -Continue to Monitor and Replete as Necessary -Repeat Phos Level in AM   Hypomagnesemia -Patient's Mag Level was 1.6 this AM -Replete with IV Mag Sulfate 2 grams -Continue to Monitor and Replete as Necessary -Repeat Mag Level in AM   Abnormal LFTs, improved -Unclear etiology at this point -AST was 52 and ALT was 76 and has improved as AST is 30 and ALT is 38 -Checked right upper quadrant ultrasound as well as an acute hepatitis panel -Acute Hepatitis Panel Negative and RUQ U/S showed No acute sonographic abnormality detected. No sonographic evidence for cholelithiasis. -Continue monitor trend Hepatic function panel -Repeat CMP in a.m.  Leukocytosis, improved  -Likely in the setting of infection versus dehydration were combination of both -WBC went from 13.5 -> 9.6 -> 7.3 -> 7.8 -> 8.7 -> 9.3 -Continue with empiric antibiotics as above for now; She states Urinary Symptoms and frequency is decreasing  -Continue monitor for signs and symptoms of infection -Repeat CBC in a.m.  Hyperglycemia -Patient's blood sugar this morning was 107  on CMP -Checked Hemoglobin A1c and was 4.9. -Continue monitor and trend blood sugars and if consistently elevated will place on sensitive NovoLog Sliding scale insulin AC  Normocytic Anemia -Patient's Hgb/Hct went from 13.5/40.0 on admission -> 11.0/32.7 -Likely dilutional drop in the setting of her being hemoconcentrated on admission -Checked Anemia panel and iron level of 27, U IBC was 174, TIBC was 201, saturation ratios were 13%, ferritin level is 145, folate level 7.3, and vitamin B12 level is 849 -Continue  monitor for signs and symptoms of bleeding -Repeat CBC in the a.m.  Metabolic Acidosis -Mild as CO2 is 20; AG is 11 -IVF now D/C'd  -Continue to Monitor and Trend and Repeat CMP in AM   DVT prophylaxis: Enoxaparin 40 mg sq q24h Code Status: FULL CODE  Family Communication: No family present at bedside but discussed with Son over the telephon Disposition Plan: SNF in the next 24-48 hours   Consultants:   None but I discussed the case with Neurology [name] on 02/14/2019  Procedures:  None   Antimicrobials:  Anti-infectives (From admission, onward)   Start     Dose/Rate Route Frequency Ordered Stop   02/11/19 1600  cefTRIAXone (ROCEPHIN) 1 g in sodium chloride 0.9 % 100 mL IVPB     1 g 200 mL/hr over 30 Minutes Intravenous Every 24 hours 02/10/19 1652     02/10/19 1615  cefTRIAXone (ROCEPHIN) 1 g in sodium chloride 0.9 % 100 mL IVPB  Status:  Discontinued     1 g 200 mL/hr over 30 Minutes Intravenous Every 24 hours 02/10/19 1611 02/10/19 1818     Subjective: Seen and examined at bedside she is much more awake today than she was yesterday and she states that she thinks she is getting better.  Denies any pain in the abdomen and states that urine frequency is improved since admission.  Still feeling weak.  No nausea or vomiting.  No other concerns or complaints at this time is agreeable to skilled nursing facility and son is agreeable with the plan of care and feels like his mom would benefit significantly.  Objective: Vitals:   02/14/19 2115 02/14/19 2139 02/15/19 0528 02/15/19 1346  BP: (!) 143/127 130/80 (!) 152/97 (!) 134/99  Pulse: (!) 102  90 (!) 105  Resp: 20  17   Temp: 98.8 F (37.1 C)  99.8 F (37.7 C) 99.5 F (37.5 C)  TempSrc:   Oral Oral  SpO2: 97%  99% 99%  Weight:        Intake/Output Summary (Last 24 hours) at 02/15/2019 1631 Last data filed at 02/15/2019 0900 Gross per 24 hour  Intake 360 ml  Output 250 ml  Net 110 ml   Filed Weights    02/13/19 1541  Weight: 58.4 kg   Examination: Physical Exam:  Constitutional: Thin Caucasian female currently no acute distress and she is much more awake than she was yesterday.  Appears calm and comfortable Eyes: Lids and conjunctive are normal.  Sclera anicteric ENMT: External ears nose appear normal.  Grossly normal hearing Neck: Appears supple no JVD Respiratory: Slight diminished auscultation bilaterally no appreciable wheezing, rales or rhonchi.  Patient not tachypneic wheezing and accessory muscles to breathe Cardiovascular: Mildly tachycardic rate but regular rhythm.  No extremity edema noted Abdomen: Soft, nontender, nondistended.  Bowel sounds present GU: Deferred Musculoskeletal: No contractures or cyanosis.  No joint deformity noted Skin: Skin is warm and dry no appreciable rashes or lesions on limited skin evaluation Neurologic: Cranial  nerves II through XII grossly intact no appreciable focal deficits Psychiatric: She is awake, alert, oriented.  Has a slightly depressed appearing mood but normal affect  Data Reviewed: I have personally reviewed following labs and imaging studies  CBC: Recent Labs  Lab 02/11/19 0907 02/12/19 0619 02/13/19 0542 02/14/19 0526 02/15/19 0518  WBC 9.6 7.3 7.8 8.7 9.3  NEUTROABS 6.5 3.6 4.9 5.7 5.9  HGB 12.0 11.6* 11.5* 11.2* 11.0*  HCT 37.0 34.4* 34.7* 32.4* 32.7*  MCV 98.4 96.4 96.1 92.8 92.6  PLT 218 200 211 216 225   Basic Metabolic Panel: Recent Labs  Lab 02/11/19 0907 02/12/19 0619 02/13/19 0542 02/14/19 0526 02/15/19 0518  NA 139 140 142 138 135  K 2.6* 4.3 3.6 3.0* 4.0  CL 107 110 110 104 104  CO2 22 20* 22 20* 20*  GLUCOSE 107* 83 75 77 84  BUN 25* CREATININE 0.76 0.61 0.56 0.62 0.52  CALCIUM 8.4* 8.4* 8.3* 8.3* 8.3*  MG 1.7 1.6* 1.9 1.6* 1.6*  PHOS 1.9* 1.9* 2.4* 2.5 2.2*   GFR: CrCl cannot be calculated (Unknown ideal weight.). Liver Function Tests: Recent Labs  Lab 02/11/19 0907 02/12/19 0619  02/13/19 0542 02/14/19 0526 02/15/19 0518  AST 52* 39 28 33 30  ALT 76* 63* 47* 43 38  ALKPHOS 54 48 52 55 52  BILITOT 0.7 0.6 0.5 0.8 0.9  PROT 6.8 6.4* 6.4* 6.6 6.5  ALBUMIN 3.5 3.2* 3.2* 3.3* 3.3*   No results for input(s): LIPASE, AMYLASE in the last 168 hours. Recent Labs  Lab 02/14/19 1505  AMMONIA 24   Coagulation Profile: No results for input(s): INR, PROTIME in the last 168 hours. Cardiac Enzymes: No results for input(s): CKTOTAL, CKMB, CKMBINDEX, TROPONINI in the last 168 hours. BNP (last 3 results) No results for input(s): PROBNP in the last 8760 hours. HbA1C: No results for input(s): HGBA1C in the last 72 hours. CBG: Recent Labs  Lab 02/10/19 1334  GLUCAP 90   Lipid Profile: No results for input(s): CHOL, HDL, LDLCALC, TRIG, CHOLHDL, LDLDIRECT in the last 72 hours. Thyroid Function Tests: No results for input(s): TSH, T4TOTAL, FREET4, T3FREE, THYROIDAB in the last 72 hours. Anemia Panel: Recent Labs    02/14/19 0526  VITAMINB12 849  FOLATE 7.3  FERRITIN 145  TIBC 201*  IRON 27*  RETICCTPCT 1.0   Sepsis Labs: No results for input(s): PROCALCITON, LATICACIDVEN in the last 168 hours.  Recent Results (from the past 240 hour(s))  Culture, blood (routine x 2)     Status: None (Preliminary result)   Collection Time: 02/11/19  9:07 AM  Result Value Ref Range Status   Specimen Description   Final    BLOOD RIGHT ARM Performed at [facility], [address] [place], [place] [address]    Special Requests   Final    BOTTLES DRAWN AEROBIC AND ANAEROBIC Blood Culture adequate volume Performed at [facility], [address], [place], [place] [address]    Culture   Final    NO GROWTH 4 DAYS Performed at [facility], [address] [place], [place] [address]    Report Status PENDING  Incomplete  Culture, blood (routine x 2)     Status: None (Preliminary result)   Collection Time: 02/11/19  9:07 AM  Result Value Ref  Range Status   Specimen Description   Final    BLOOD RIGHT HAND Performed at [facility], [address], [place], [place] [address]    Special Requests   Final    BOTTLES DRAWN AEROBIC ONLY Blood Culture results may not be optimal due to an inadequate volume of blood received in culture bottles Performed at [facility], [address] [place], [place] [address]    Culture   Final    NO GROWTH 4 DAYS Performed at [facility], [address], [place], [place] [address]    Report Status PENDING  Incomplete  Culture, Urine     Status: None   Collection Time: 02/12/19  6:26 AM  Result Value Ref Range Status   Specimen Description   Final    URINE, RANDOM Performed at [facility], [address] [place], [place] [address]    Special Requests   Final    NONE Performed at [facility], [address], [place], [place] [address]    Culture   Final    NO GROWTH Performed at [facility], [address], [place], [place] [address]    Report Status 02/13/2019 FINAL  Final     RN Pressure Injury Documentation:   Radiology Studies: Ct Head Wo Contrast  Result Date: 02/14/2019 CLINICAL DATA:  Multiple sclerosis, weakness, increasing urinary incontinence EXAM: CT HEAD WITHOUT CONTRAST TECHNIQUE: Contiguous axial images were obtained from the base of the skull through the vertex without intravenous contrast. COMPARISON:  01/16/2017 FINDINGS: Brain: No evidence of acute infarction, hemorrhage, hydrocephalus, extra-axial collection or mass lesion/mass effect. Extensive periventricular and deep white matter hypodensity. Vascular: No hyperdense vessel or unexpected calcification. Skull: Normal. Negative for fracture or focal lesion. Sinuses/Orbits: No acute finding. Other: None. IMPRESSION: No acute intracranial pathology. Extensive periventricular and deep white matter hypodensity, in keeping with reported  history of multiple sclerosis and or small-vessel white matter disease. Electronically Signed   By: Lauralyn Primes M.D.   On: 02/14/2019 16:01    Scheduled Meds: . carbamazepine  200 mg Oral TID  . cholecalciferol  1,000 Units Oral Daily  . enoxaparin (LOVENOX) injection  40 mg Subcutaneous Q24H  . feeding supplement  1 Container Oral Q24H  . feeding supplement (ENSURE ENLIVE)  237 mL Oral BID BM  . levothyroxine  50 mcg Oral Q0600  . metoprolol succinate  25 mg Oral Daily  . multivitamin with minerals  1 tablet Oral Daily  . sodium chloride flush  10-40 mL Intracatheter Q12H  . sodium chloride flush  3 mL Intravenous Once   Continuous Infusions: . cefTRIAXone (ROCEPHIN)  IV 1 g (02/14/19 1710)    LOS: 4 days   Merlene Laughter, DO [facility] PAGER is on AMION  If 7PM-7AM, please contact night-coverage [email or web address] Password [email or web address] 02/15/2019, 4:31 PM

## 2019-02-15 NOTE — NC FL2 (Signed)
[place] MEDICAID FL2 LEVEL OF CARE SCREENING TOOL     IDENTIFICATION  Patient Name: Melanie Richardson Birthdate: 1951-03-30 Sex: female Admission Date (Current Location): 02/10/2019  [facility] and [place] Number:  Producer, television/film/video and Address:  [facility],  [address], [place] [address]      Provider Number: [id number]  Attending Physician Name and Address:  Merlene Laughter, DO  Relative Name and Phone Number:  Zayleigh Neder [phone]    Current Level of Care: Hospital Recommended Level of Care: Skilled Nursing Facility Prior Approval Number:    Date Approved/Denied:   PASRR Number: [id number]  Discharge Plan: SNF    Current Diagnoses: Patient Active Problem List   Diagnosis Date Noted  . Normocytic anemia 02/12/2019  . UTI (urinary tract infection) 02/11/2019  . Hypokalemia 02/11/2019  . Hyperphosphatemia 02/11/2019  . Abnormal LFTs 02/11/2019  . Leukocytosis 02/11/2019  . Hyperglycemia 02/11/2019  . Dehydration 02/10/2019  . 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 High risk medication use 02/04/2017  . Right-sided Bell's palsy 12/29/2016  . Allergic rhinitis 08/07/2015  . Hypertension 08/06/2015  . Hypertriglyceridemia 08/06/2015  . Anxiety state 07/25/2015  . Dysesthesia 10/26/2014  . Abnormality of gait 10/26/2014  . Weakness 10/26/2014  . Sinusitis, acute 07/17/2013  . Wheezing 07/17/2013  . Hypothyroidism 07/17/2013  . MS (multiple sclerosis) (HCC)   . Trigeminal neuralgia     Orientation RESPIRATION BLADDER Height & Weight     Self, Time, Situation, Place  Normal Continent Weight: 128 lb 12 oz (58.4 kg) Height:     BEHAVIORAL SYMPTOMS/MOOD NEUROLOGICAL BOWEL NUTRITION STATUS      Continent Diet(regular)  AMBULATORY STATUS COMMUNICATION OF NEEDS Skin   Extensive Assist Verbally Normal                       Personal Care Assistance Level of Assistance              Functional Limitations  Info             SPECIAL CARE FACTORS FREQUENCY                       Contractures Contractures Info: Not present    Additional Factors Info                  Current Medications (02/15/2019):  This is the current hospital active medication list Current Facility-Administered Medications  Medication Dose Route Frequency Provider Last Rate Last Dose  . albuterol (PROVENTIL) (2.5 MG/3ML) 0.083% nebulizer solution 2.5 mg  2.5 mg Nebulization Q6H PRN Alwyn Ren, MD      . carbamazepine (TEGRETOL XR) 12 hr tablet 200 mg  200 mg Oral TID Marguerita Merles [place], DO   200 mg at 02/15/19 0936  . cefTRIAXone (ROCEPHIN) 1 g in sodium chloride 0.9 % 100 mL IVPB  1 g Intravenous Q24H Alwyn Ren, MD 200 mL/hr at 02/14/19 1710 1 g at 02/14/19 1710  . cholecalciferol (VITAMIN D3) tablet 1,000 Units  1,000 Units Oral Daily Alwyn Ren, MD   1,000 Units at 02/15/19 [phone]  . diazepam (VALIUM) tablet 5 mg  5 mg Oral Q12H PRN Alwyn Ren, MD      . enoxaparin (LOVENOX) injection 40 mg  40 mg Subcutaneous Q24H Alwyn Ren, MD   40 mg at 02/14/19 2143  . feeding supplement (BOOST / RESOURCE BREEZE) liquid 1 Container  1 Container Oral Q24H Marguerita Merles [place], [place]   1 Container at 02/15/19 6468  . feeding supplement (ENSURE ENLIVE) (ENSURE ENLIVE) liquid 237 mL  237 mL Oral BID BM Marguerita Merles [place], DO   237 mL at 02/13/19 2223  . hydrALAZINE (APRESOLINE) injection 10 mg  10 mg Intravenous Q6H PRN Marguerita Merles [place], DO   10 mg at 02/13/19 1704  . HYDROcodone-acetaminophen (NORCO) 7.5-325 MG per tablet 1 tablet  1 tablet Oral Q8H PRN Marguerita Merles [place], DO   1 tablet at 02/14/19 2015  . levothyroxine (SYNTHROID) tablet 50 mcg  50 mcg Oral Q0600 Alwyn Ren, MD   50 mcg at 02/15/19 0539  . metoprolol succinate (TOPROL-XL) 24 hr tablet 25 mg  25 mg Oral Daily Alwyn Ren, MD   25 mg at 02/15/19 0321  . multivitamin with minerals tablet 1  tablet  1 tablet Oral Daily Marguerita Merles [place], [place]   1 tablet at 02/15/19 2248  . Muscle Rub CREA 1 application  1 application Topical PRN Alwyn Ren, MD      . potassium PHOSPHATE 20 mmol in dextrose 5 % 500 mL infusion  20 mmol Intravenous Once Marguerita Merles [place], DO 84 mL/hr at 02/15/19 0933 20 mmol at 02/15/19 0933  . sodium chloride flush (NS) 0.9 % injection 3 mL  3 mL Intravenous Once Wynetta Fines, MD         Discharge Medications: Please see discharge summary for a list of discharge medications.  Relevant Imaging Results:  Relevant Lab Results:   Additional Information    Coralyn Helling, LCSW

## 2019-02-15 NOTE — TOC Progression Note (Signed)
Transition of Care [facility]) - Progression Note    Patient Details  Name: LAWRENCIA DEATS MRN: [id number] Date of Birth: 11/11/1950  [facility] of [facility]) CM/SW Contact  Armanda Heritage, RN Phone Number: 02/15/2019, 3:38 PM  Clinical Narrative:   Spoke with son, Nandana Birch, regarding SNF.  Informed son bed offers received from [facility] and [facility].  Son states will discuss with his spouse and call CM back with choice.          Expected Discharge Plan and Services                                                 Social Determinants of Health (SDOH) Interventions    Readmission Risk Interventions No flowsheet data found.

## 2019-02-15 NOTE — Progress Notes (Signed)
Adult Protective Services following this patient in the community. [employer] emailed a release of information form to RN case Furniture conservator/restorer.   Plan: Discharge to SNF for rehab placement.   Vivi Barrack, [place], MSW Clinical Social Worker  [phone] 02/15/2019  2:19 PM

## 2019-02-16 DIAGNOSIS — R400 Somnolence: Secondary | ICD-10-CM

## 2019-02-16 LAB — CBC WITH DIFFERENTIAL/PLATELET
Abs Immature Granulocytes: 0.08 10*3/uL — ABNORMAL HIGH (ref 0.00–0.07)
Basophils Absolute: 0.1 10*3/uL (ref 0.0–0.1)
Basophils Relative: 1 %
Eosinophils Absolute: 0.2 10*3/uL (ref 0.0–0.5)
Eosinophils Relative: 2 %
HCT: 33.9 % — ABNORMAL LOW (ref 36.0–46.0)
Hemoglobin: 11.8 g/dL — ABNORMAL LOW (ref 12.0–15.0)
Immature Granulocytes: 1 %
Lymphocytes Relative: 19 %
Lymphs Abs: 1.9 10*3/uL (ref 0.7–4.0)
MCH: 32.1 pg (ref 26.0–34.0)
MCHC: 34.8 g/dL (ref 30.0–36.0)
MCV: 92.1 fL (ref 80.0–100.0)
Monocytes Absolute: 1.3 10*3/uL — ABNORMAL HIGH (ref 0.1–1.0)
Monocytes Relative: 13 %
Neutro Abs: 6.5 10*3/uL (ref 1.7–7.7)
Neutrophils Relative %: 64 %
Platelets: 255 10*3/uL (ref 150–400)
RBC: 3.68 MIL/uL — ABNORMAL LOW (ref 3.87–5.11)
RDW: 13.1 % (ref 11.5–15.5)
WBC: 10 10*3/uL (ref 4.0–10.5)
nRBC: 0 % (ref 0.0–0.2)

## 2019-02-16 LAB — CULTURE, BLOOD (ROUTINE X 2)
Culture: NO GROWTH
Culture: NO GROWTH
Special Requests: ADEQUATE

## 2019-02-16 LAB — COMPREHENSIVE METABOLIC PANEL
ALT: 35 U/L (ref 0–44)
AST: 25 U/L (ref 15–41)
Albumin: 3.2 g/dL — ABNORMAL LOW (ref 3.5–5.0)
Alkaline Phosphatase: 51 U/L (ref 38–126)
Anion gap: 11 (ref 5–15)
BUN: 8 mg/dL (ref 8–23)
CO2: 21 mmol/L — ABNORMAL LOW (ref 22–32)
Calcium: 8.9 mg/dL (ref 8.9–10.3)
Chloride: 105 mmol/L (ref 98–111)
Creatinine, Ser: 0.6 mg/dL (ref 0.44–1.00)
GFR calc Af Amer: >60 mL/min (ref >60)
GFR calc non Af Amer: >60 mL/min (ref >60)
Glucose, Bld: 99 mg/dL (ref 70–99)
Potassium: 3.9 mmol/L (ref 3.5–5.1)
Sodium: 137 mmol/L (ref 135–145)
Total Bilirubin: 0.5 mg/dL (ref 0.3–1.2)
Total Protein: 6.9 g/dL (ref 6.5–8.1)

## 2019-02-16 LAB — MAGNESIUM: Magnesium: 2 mg/dL (ref 1.7–2.4)

## 2019-02-16 LAB — PHOSPHORUS: Phosphorus: 3.1 mg/dL (ref 2.5–4.6)

## 2019-02-16 NOTE — Progress Notes (Addendum)
PROGRESS NOTE    Melanie Richardson  FGB:[id number] DOB: 08-13-1951 DOA: 02/10/2019 PCP: Salley Scarlet, MD    Brief Narrative:  HPI per Dr. Jerelene Redden on 02/10/2019 Melanie Richardson a 68 y.o.femalewith medical history significant ofmultiple sclerosis came in with complaints of generalized weakness. She has been weak over the last 1 week with increasing episodes of urinary incontinence which was unusual for her. She denied any burning urination. She denied any fever chest pain shortness of breath cough nausea vomiting diarrhea abdominal pain. At baseline she walks with a walker. However she has a wheelchair at home for longer distances. She has aides checking on her several times during the day but she lives alone. Her appetite decreased p.o. intake and dizziness upon standing.  **Interim History  Worked with physical therapy and they recommending skilled nursing facility. Discussed with the patient about going to a skilled nursing facility and she does not know if she can or not and did not participate with physical therapy yesterday despite maximum encouragement. On 02/15/2019 she was somnolent and extremely drowsy and somewhat difficult to arouse so sedating medications have been held and CT head and ABG was obtained.  Patient also pancultured.  Patient placed on IV fluids due to weakness.     Assessment & Plan:   Active Problems:   MS (multiple sclerosis) (HCC)   Trigeminal neuralgia   Hypothyroidism   Weakness   Anxiety state   Hypertension   Dehydration   UTI (urinary tract infection)   Hypokalemia   Hyperphosphatemia   Abnormal LFTs   Leukocytosis   Hyperglycemia   Normocytic anemia  1 generalized weakness likely secondary to dehydration from poor oral intake in the setting of UTI Patient still with significant weakness.  Patient was changed to inpatient status given profound weakness from dehydration requiring maintenance IV fluids at 100 cc/h.  Patient  pancultured with no growth to date.  Patient on IV Rocephin and has completed 5 days of antibiotic treatment.  Patient also noted to be orthostatic.  Patient on nutritional supplementation.  Electrolytes have been repleted.  Potassium at 3.9, magnesium at 2.0, phosphorus at 3.1.  Due to patient's somnolence amitriptyline has been discontinued and sedation medications held and scheduled already.  Change to as needed.  PT/OT.  Requires skilled nursing facility.  Encourage oral intake.  Follow.  2.  Somnolence Likely medication induced.  Patient noted over the past few days to be somnolent, lethargic, drowsy and difficult to arouse.  Patient with some somnolence this morning however on noxious stimuli able to tell me her name, where she is at, time, year, month, who the president is.  ABG done with a pH of 7.48, PCO2 of 26, PO2 of 77, bicarb of 19 with a O2 sats of 96.2%.  Patient's amitriptyline has been discontinued and scheduled narcotic pain medication change to as needed.  TSH within normal limits.  Patient pancultured with no growth to date.  RPR nonreactive, vitamin B12 within normal limits at 849.  CT head negative for any acute abnormalities.  Free carbamazepine level was 2.2.  Total carbamazepine level was 9.9.  [name] discussed with neurology, [name] who recommended decreasing carbamazepine dose and holding scheduled narcotics.  Patient on follow-up this afternoon has improved clinically sitting up alert and attempting to pick at her lunch.  Follow for now.  3.  Suspected urinary tract infection Urinalysis done on admission was concerning for UTI.  Urine cultures were never drawn on admission however  obtained after patient was on antibiotics and subsequently with no growth to date.  Patient pancultured and blood cultures with no growth to date.  Patient status post 5 days IV Rocephin.  Patient currently afebrile.  Improving clinically.  Follow.  4.  History of MS/trigeminal neuralgia  Amitriptyline 50 mg p.o. nightly was discontinued due to somnolence.  Carbamazepine has been changed from 400 mg 3 times daily to 200 mg 3 times daily per neuro recommendations.  Diazepam has been changed to as needed.  Will need outpatient follow-up with neurology.  5.  Hypertension Blood pressure stable.  Patient currently on Toprol-XL.  Patient's home regimen of HCTZ and Cozaar have been held throughout the hospitalization.  Outpatient follow-up.  6.  Hypothyroidism TSH at 4.762.  Free T4 0.98.  Continue current regimen of Synthroid.  Outpatient follow-up.  7.  Osteopenia Continue cholecalciferol 1000 units daily.  8.  Hypokalemia/hypophosphatemia/hypomagnesemia Repleted.  Potassium at 3.9, phosphorus at 3.1, magnesium at 2.0.  Follow.  9.  Metabolic acidosis Improving.  Follow.  10.  Abnormal LFTs Questionable etiology.  Right upper quadrant ultrasound with no acute sonographic abnormalities noted.  Acute hepatitis panel was negative.  LFTs trended down.  Outpatient follow-up.  11.  Normocytic anemia Likely dilutional component.  Patient with no overt bleeding.  Hemoglobin currently stable at 11.8.  Outpatient follow-up. Line  DVT prophylaxis: Lovenox Code Status: Full Family Communication: Updated patient.  Tried to call son on telephone however went to voicemail.   Disposition Plan: SNF when medically stable and clinically improved hopefully the next 24 to 48 hours.   Consultants:   None  Procedures:   CT head 02/14/2019  Right upper quadrant ultrasound 02/11/2019  Antimicrobials:  IV Rocephin 02/10/2019>>>>> 02/15/2019   Subjective: Patient somewhat drowsy this morning however arousable but drifts back off to sleep.  Patient opens eyes to sternal rub and is able to tell me who she is, where she is, what year it is, who the president is.  Denies any chest pain or shortness of breath.  No abdominal pain.  Objective: Vitals:   02/15/19 0528 02/15/19 1346 02/15/19 2051  02/16/19 0530  BP: (!) 152/97 (!) 134/99 (!) 127/94 (!) 139/92  Pulse: 90 (!) 105 98 92  Resp: Temp: 99.8 F (37.7 C) 99.5 F (37.5 C) 99.6 F (37.6 C) 99.1 F (37.3 C)  TempSrc: Oral Oral Oral Oral  SpO2: 99% 99% 98% 98%  Weight:        Intake/Output Summary (Last 24 hours) at 02/16/2019 1045 Last data filed at 02/16/2019 1009 Gross per 24 hour  Intake 360 ml  Output 800 ml  Net -440 ml   Filed Weights   02/13/19 1541  Weight: 58.4 kg    Examination:  General exam: Asleep. Respiratory system: Clear to auscultation bilaterally anterior lung fields. Respiratory effort normal. Cardiovascular system: S1 & S2 heard, RRR. No JVD, murmurs, rubs, gallops or clicks. No pedal edema. Gastrointestinal system: Abdomen is nondistended, soft and nontender. No organomegaly or masses felt. Normal bowel sounds heard. Central nervous system: Patient very sleepy and drowsy but arousable.  Moving extremities spontaneously.   Extremities: Symmetric 5 x 5 power. Skin: No rashes, lesions or ulcers Psychiatry: Judgement and insight appear normal. Mood & affect appropriate.     Data Reviewed: I have personally reviewed following labs and imaging studies  CBC: Recent Labs  Lab 02/12/19 0619 02/13/19 0542 02/14/19 0526 02/15/19 0518 02/16/19 0434  WBC 7.3 7.8 8.7  9.3 10.0  NEUTROABS 3.6 4.9 5.7 5.9 6.5  HGB 11.6* 11.5* 11.2* 11.0* 11.8*  HCT 34.4* 34.7* 32.4* 32.7* 33.9*  MCV 96.4 96.1 92.8 92.6 92.1  PLT 200 211 216 225 255   Basic Metabolic Panel: Recent Labs  Lab 02/12/19 0619 02/13/19 0542 02/14/19 0526 02/15/19 0518 02/16/19 0434  NA 140 142 138 135 137  K 4.3 3.6 3.0* 4.0 3.9  CL 110 110 104 104 105  CO2 20* 22 20* 20* 21*  GLUCOSE 83 75 77 84 99  BUN CREATININE 0.61 0.56 0.62 0.52 0.60  CALCIUM 8.4* 8.3* 8.3* 8.3* 8.9  MG 1.6* 1.9 1.6* 1.6* 2.0  PHOS 1.9* 2.4* 2.5 2.2* 3.1   GFR: CrCl cannot be calculated (Unknown ideal weight.). Liver  Function Tests: Recent Labs  Lab 02/12/19 0619 02/13/19 0542 02/14/19 0526 02/15/19 0518 02/16/19 0434  AST 39 28 33 30 25  ALT 63* 47* 43 38 35  ALKPHOS 48 52 55 52 51  BILITOT 0.6 0.5 0.8 0.9 0.5  PROT 6.4* 6.4* 6.6 6.5 6.9  ALBUMIN 3.2* 3.2* 3.3* 3.3* 3.2*   No results for input(s): LIPASE, AMYLASE in the last 168 hours. Recent Labs  Lab 02/14/19 1505  AMMONIA 24   Coagulation Profile: No results for input(s): INR, PROTIME in the last 168 hours. Cardiac Enzymes: No results for input(s): CKTOTAL, CKMB, CKMBINDEX, TROPONINI in the last 168 hours. BNP (last 3 results) No results for input(s): PROBNP in the last 8760 hours. HbA1C: No results for input(s): HGBA1C in the last 72 hours. CBG: Recent Labs  Lab 02/10/19 1334  GLUCAP 90   Lipid Profile: No results for input(s): CHOL, HDL, LDLCALC, TRIG, CHOLHDL, LDLDIRECT in the last 72 hours. Thyroid Function Tests: No results for input(s): TSH, T4TOTAL, FREET4, T3FREE, THYROIDAB in the last 72 hours. Anemia Panel: Recent Labs    02/14/19 0526  VITAMINB12 849  FOLATE 7.3  FERRITIN 145  TIBC 201*  IRON 27*  RETICCTPCT 1.0   Sepsis Labs: No results for input(s): PROCALCITON, LATICACIDVEN in the last 168 hours.  Recent Results (from the past 240 hour(s))  Culture, blood (routine x 2)     Status: None (Preliminary result)   Collection Time: 02/11/19  9:07 AM  Result Value Ref Range Status   Specimen Description   Final    BLOOD RIGHT ARM Performed at [facility], [address], [place], [place] [address]    Special Requests   Final    BOTTLES DRAWN AEROBIC AND ANAEROBIC Blood Culture adequate volume Performed at [facility], [address] [place], [place] [address]    Culture   Final    NO GROWTH 4 DAYS Performed at [facility], [address], [place], [place] [address]    Report Status PENDING  Incomplete  Culture, blood (routine x 2)     Status: None (Preliminary  result)   Collection Time: 02/11/19  9:07 AM  Result Value Ref Range Status   Specimen Description   Final    BLOOD RIGHT HAND Performed at [facility], [address], [place], [place] [address]    Special Requests   Final    BOTTLES DRAWN AEROBIC ONLY Blood Culture results may not be optimal due to an inadequate volume of blood received in culture bottles Performed at [facility], [address], [place], [place] [address]    Culture   Final  NO GROWTH 4 DAYS Performed at [facility], [address], [place], [place] [address]    Report Status PENDING  Incomplete  Culture, Urine     Status: None   Collection Time: 02/12/19  6:26 AM  Result Value Ref Range Status   Specimen Description   Final    URINE, RANDOM Performed at [facility], [address] [place], [place] [address]    Special Requests   Final    NONE Performed at [facility], [address], [place], [place] [address]    Culture   Final    NO GROWTH Performed at [facility], [address] [place], [place] [address]    Report Status 02/13/2019 FINAL  Final         Radiology Studies: Ct Head Wo Contrast  Result Date: 02/14/2019 CLINICAL DATA:  Multiple sclerosis, weakness, increasing urinary incontinence EXAM: CT HEAD WITHOUT CONTRAST TECHNIQUE: Contiguous axial images were obtained from the base of the skull through the vertex without intravenous contrast. COMPARISON:  01/16/2017 FINDINGS: Brain: No evidence of acute infarction, hemorrhage, hydrocephalus, extra-axial collection or mass lesion/mass effect. Extensive periventricular and deep white matter hypodensity. Vascular: No hyperdense vessel or unexpected calcification. Skull: Normal. Negative for fracture or focal lesion. Sinuses/Orbits: No acute finding. Other: None. IMPRESSION: No acute intracranial pathology. Extensive periventricular and deep white matter  hypodensity, in keeping with reported history of multiple sclerosis and or small-vessel white matter disease. Electronically Signed   By: Lauralyn Primes M.D.   On: 02/14/2019 16:01        Scheduled Meds: . carbamazepine  200 mg Oral TID  . cholecalciferol  1,000 Units Oral Daily  . enoxaparin (LOVENOX) injection  40 mg Subcutaneous Q24H  . feeding supplement  1 Container Oral Q24H  . feeding supplement (ENSURE ENLIVE)  237 mL Oral BID BM  . levothyroxine  50 mcg Oral Q0600  . metoprolol succinate  25 mg Oral Daily  . multivitamin with minerals  1 tablet Oral Daily  . sodium chloride flush  10-40 mL Intracatheter Q12H  . sodium chloride flush  3 mL Intravenous Once   Continuous Infusions:   LOS: 5 days    Time spent: 35 mins    Ramiro Harvest, MD [facility]  If 7PM-7AM, please contact night-coverage [email or web address] 02/16/2019, 10:45 AM

## 2019-02-17 LAB — BASIC METABOLIC PANEL
Anion gap: 11 (ref 5–15)
BUN: 10 mg/dL (ref 8–23)
CO2: 21 mmol/L — ABNORMAL LOW (ref 22–32)
Calcium: 8.9 mg/dL (ref 8.9–10.3)
Chloride: 104 mmol/L (ref 98–111)
Creatinine, Ser: 0.68 mg/dL (ref 0.44–1.00)
GFR calc Af Amer: >60 mL/min (ref >60)
GFR calc non Af Amer: >60 mL/min (ref >60)
Glucose, Bld: 97 mg/dL (ref 70–99)
Potassium: 3.8 mmol/L (ref 3.5–5.1)
Sodium: 136 mmol/L (ref 135–145)

## 2019-02-17 LAB — CBC WITH DIFFERENTIAL/PLATELET
Abs Immature Granulocytes: 0.04 10*3/uL (ref 0.00–0.07)
Basophils Absolute: 0.1 10*3/uL (ref 0.0–0.1)
Basophils Relative: 1 %
Eosinophils Absolute: 0.2 10*3/uL (ref 0.0–0.5)
Eosinophils Relative: 2 %
HCT: 33 % — ABNORMAL LOW (ref 36.0–46.0)
Hemoglobin: 11.2 g/dL — ABNORMAL LOW (ref 12.0–15.0)
Immature Granulocytes: 0 %
Lymphocytes Relative: 23 %
Lymphs Abs: 2.2 10*3/uL (ref 0.7–4.0)
MCH: 31.6 pg (ref 26.0–34.0)
MCHC: 33.9 g/dL (ref 30.0–36.0)
MCV: 93.2 fL (ref 80.0–100.0)
Monocytes Absolute: 1.3 10*3/uL — ABNORMAL HIGH (ref 0.1–1.0)
Monocytes Relative: 13 %
Neutro Abs: 5.9 10*3/uL (ref 1.7–7.7)
Neutrophils Relative %: 61 %
Platelets: 265 10*3/uL (ref 150–400)
RBC: 3.54 MIL/uL — ABNORMAL LOW (ref 3.87–5.11)
RDW: 13 % (ref 11.5–15.5)
WBC: 9.6 10*3/uL (ref 4.0–10.5)
nRBC: 0 % (ref 0.0–0.2)

## 2019-02-17 LAB — PHOSPHORUS: Phosphorus: 2.9 mg/dL (ref 2.5–4.6)

## 2019-02-17 LAB — MAGNESIUM: Magnesium: 1.9 mg/dL (ref 1.7–2.4)

## 2019-02-17 NOTE — Progress Notes (Signed)
Nutrition Follow-up  RD working remotely.   DOCUMENTATION CODES:   (unable to assess for malnutrition at this time. )  INTERVENTION:  - continue boost breeze once/day and will decrease ensure enlive to once/day. - will order magic cup BID with meals, each supplement provides 290 kcal and 9 grams of protein. - continue to encourage PO intakes.    NUTRITION DIAGNOSIS:   Increased nutrient needs related to acute illness as evidenced by estimated needs. -ongoing  GOAL:   Patient will meet greater than or equal to 90% of their needs -unmet at this time.   MONITOR:   PO intake, Supplement acceptance, Labs, Weight trends, I & O's  ASSESSMENT:   68 y.o. female with medical history significant of multiple sclerosis. She presented to the ED with complaint of generalized weakness x1 week with increasing episodes of urinary incontinence which is unusual for her. At baseline she walks with a walker, but does have a wheelchair at home for longer distances. She has aides checking on her several times during the day but she lives alone. She also reported having dizziness when standing recently.   Patient not weighed since admission (5/10). Per RN flow sheet, patient is a/o to self only today. Per flow sheet, patient consumed 100% of dinner on 5/11; 25% of breakfast and dinner on 5/12; 25% of dinner on 5/13; and 10% of breakfast this AM. She has accepted 1 of 3 cartons of boost breeze offered and 3 out of 7 bottles of ensure enlive offered.   Per notes: generalized weakness likely 2/2 dehydration from poor oral intakes in the setting of UTI, somnolence, metabolic acidosis--improving. Per MD note yesterday, possible d/c within 24-48 hours.     Medications reviewed; 1000 units vitamin D3/day, 50 mcg oral synthroid/day, daily multivitamin with minerals. Labs reviewed.   Diet Order:   Diet Order            Diet regular Room service appropriate? Yes; Fluid consistency: Thin  Diet effective now               EDUCATION NEEDS:   No education needs have been identified at this time  Skin:  Skin Assessment: Reviewed RN Assessment  Last BM:  5/12  Height:   Ht Readings from Last 1 Encounters:  08/05/18 5\' 5"  (1.651 m)    Weight:   Wt Readings from Last 1 Encounters:  02/13/19 58.4 kg    Ideal Body Weight:     BMI:  Body mass index is 21.42 kg/m.  Estimated Nutritional Needs:   Kcal:  1775-1950 kcal  Protein:  70-80 grams  Fluid:  >/= 2 L/day     Trenton Gammon, MS, RD, LDN, [facility] Inpatient Clinical Dietitian Pager # [phone] After hours/weekend pager # [phone]

## 2019-02-17 NOTE — Progress Notes (Signed)
PROGRESS NOTE    Melanie Richardson  ZOX:[id number] DOB: 02/24/1951 DOA: 02/10/2019 PCP: Salley Scarleturham, [place] F, MD    Brief Narrative:  HPI per Dr. Jerelene ReddenElizabeth Richardson on 02/10/2019 Melanie Richardson a 68 y.o.femalewith medical history significant ofmultiple sclerosis came in with complaints of generalized weakness. She has been weak over the last 1 week with increasing episodes of urinary incontinence which was unusual for her. She denied any burning urination. She denied any fever chest pain shortness of breath cough nausea vomiting diarrhea abdominal pain. At baseline she walks with a walker. However she has a wheelchair at home for longer distances. She has aides checking on her several times during the day but she lives alone. Her appetite decreased p.o. intake and dizziness upon standing.  **Interim History  Worked with physical therapy and they recommending skilled nursing facility. Discussed with the patient about going to a skilled nursing facility and she does not know if she can or not and did not participate with physical therapy yesterday despite maximum encouragement. On 02/15/2019 she was somnolent and extremely drowsy and somewhat difficult to arouse so sedating medications have been held and CT head and ABG was obtained.  Patient also pancultured.  Patient placed on IV fluids due to weakness.     Assessment & Plan:   Active Problems:   MS (multiple sclerosis) (HCC)   Trigeminal neuralgia   Hypothyroidism   Weakness   Anxiety state   Hypertension   Dehydration   UTI (urinary tract infection)   Hypokalemia   Hyperphosphatemia   Abnormal LFTs   Leukocytosis   Hyperglycemia   Normocytic anemia   Somnolence  1 generalized weakness likely secondary to dehydration from poor oral intake in the setting of UTI Patient still with significant weakness.  Patient was changed to inpatient status given profound weakness from dehydration requiring maintenance IV fluids at 100 cc/h.   Patient pancultured with no growth to date.  Patient on IV Rocephin and has completed 5 days of antibiotic treatment.  Patient also noted to be orthostatic.  Patient on nutritional supplementation.  Electrolytes have been repleted.  Potassium at 3.9, magnesium at 2.0, phosphorus at 3.1.  Due to patient's somnolence amitriptyline has been discontinued and sedation medications held.  Pain medication changed to as needed.  PT/OT.  Requires skilled nursing facility.  Encourage oral intake.  Follow.  2.  Somnolence Likely medication induced.  Patient noted over the past few days to be somnolent, lethargic, drowsy and difficult to arouse usually in the mornings however patient more alert in the afternoons following commands.  Patient with some somnolence this morning however on noxious stimuli able to tell me her name, where she is at, time, year, month, who the president is.  More alert this afternoon.  ABG done with a pH of 7.48, PCO2 of 26, PO2 of 77, bicarb of 19 with a O2 sats of 96.2%.  Patient's amitriptyline has been discontinued and scheduled narcotic pain medication change to as needed.  TSH within normal limits.  Patient pancultured with no growth to date.  RPR nonreactive, vitamin B12 within normal limits at 849.  CT head negative for any acute abnormalities.  Free carbamazepine level was 2.2.  Total carbamazepine level was 9.9.  Dr. Marland McalpineSheikh discussed with neurology, Dr. Amada JupiterKirkpatrick who recommended decreasing carbamazepine dose and holding scheduled narcotics.  Patient on follow-up this afternoon has improved clinically sitting up alert and attempting to pick at her lunch.  Follow for now.  3.  Suspected  urinary tract infection Urinalysis done on admission was concerning for UTI.  Urine cultures were never drawn on admission however obtained after patient was on antibiotics and subsequently with no growth to date.  Patient pancultured and blood cultures with no growth to date.  Patient status post 5 days  IV Rocephin.  Patient currently afebrile.  Follow.  4.  History of MS/trigeminal neuralgia Amitriptyline 50 mg p.o. nightly was discontinued due to somnolence.  Carbamazepine has been changed from 400 mg 3 times daily to 200 mg 3 times daily per neuro recommendations.  Diazepam has been changed to as needed.  Will need outpatient follow-up with neurology.  5.  Hypertension Blood pressure controlled.  Continue Toprol-XL. Patient's home regimen of HCTZ and Cozaar have been held throughout the hospitalization.  Outpatient follow-up.  6.  Hypothyroidism TSH at 4.762.  Free T4 0.98.  Continue current regimen of Synthroid.  Outpatient follow-up.  7.  Osteopenia Continue cholecalciferol 1000 units daily.  8.  Hypokalemia/hypophosphatemia/hypomagnesemia Potassium repleted and currently at 3.8.  Magnesium at 1.9.  Phosphorus at 2.9.  9.  Metabolic acidosis Improving.  Follow.  10.  Abnormal LFTs Questionable etiology.  Right upper quadrant ultrasound with no acute sonographic abnormalities noted.  Acute hepatitis panel was negative.  LFTs trended down.  Outpatient follow-up.  11.  Normocytic anemia Likely dilutional component.  No overt bleeding.  Hemoglobin stable at 11.2.  Outpatient follow-up.    DVT prophylaxis: Lovenox Code Status: Full Family Communication: Updated patient.  Disposition Plan: SNF when medically stable and bed available.    Consultants:   None  Procedures:   CT head 02/14/2019  Right upper quadrant ultrasound 02/11/2019  Antimicrobials:  IV Rocephin 02/10/2019>>>>> 02/15/2019   Subjective: Patient drowsy and sleepy early on this morning.  This afternoon patient sitting up in the chair with lunch in front of her however minimal oral intake.  Patient alert and oriented to self place and time.  Patient knows Garnet Koyanagi is the president.  Patient denies any chest pain.  No shortness of breath.  No abdominal pain.  Objective: Vitals:   02/16/19 1310 02/16/19  1500 02/16/19 2022 02/17/19 0528  BP: 120/84  108/72 (!) 124/59  Pulse: 86  88 87  Resp:   16 19  Temp: 99.7 F (37.6 C)  98.9 F (37.2 C) 99.2 F (37.3 C)  TempSrc: Oral  Oral Oral  SpO2: 97% 97% 97% 96%  Weight:        Intake/Output Summary (Last 24 hours) at 02/17/2019 1052 Last data filed at 02/17/2019 0830 Gross per 24 hour  Intake 60 ml  Output 500 ml  Net -440 ml   Filed Weights   02/13/19 1541  Weight: 58.4 kg    Examination:  General exam: Alert. Respiratory system: Clear to auscultation bilaterally.  No wheezes, no crackles, no rhonchi.  Normal respiratory effort.  Cardiovascular system: Regular rate and rhythm no murmurs rubs or gallops.  No JVD.  No lower extremity edema. Gastrointestinal system: Abdomen is soft, nontender, nondistended, positive bowel sounds.  No rebound.  No guarding.  Central nervous system: Patient very sleepy and drowsy but arousable early on in the morning.  Patient more alert and oriented x3, this afternoon and sitting up in the chair moving extremities spontaneously, following commands and answering questions appropriately..  Moving extremities spontaneously.   Extremities: Symmetric 5 x 5 power. Skin: No rashes, lesions or ulcers Psychiatry: Judgement and insight appear normal. Mood & affect appropriate.  Data Reviewed: I have personally reviewed following labs and imaging studies  CBC: Recent Labs  Lab 02/13/19 0542 02/14/19 0526 02/15/19 0518 02/16/19 0434 02/17/19 0306  WBC 7.8 8.7 9.3 10.0 9.6  NEUTROABS 4.9 5.7 5.9 6.5 5.9  HGB 11.5* 11.2* 11.0* 11.8* 11.2*  HCT 34.7* 32.4* 32.7* 33.9* 33.0*  MCV 96.1 92.8 92.6 92.1 93.2  PLT 211 216 225 255 265   Basic Metabolic Panel: Recent Labs  Lab 02/13/19 0542 02/14/19 0526 02/15/19 0518 02/16/19 0434 02/17/19 0306  NA 142 138 135 137 136  K 3.6 3.0* 4.0 3.9 3.8  CL 110 104 104 105 104  CO2 22 20* 20* 21* 21*  GLUCOSE 75 77 84 99 97  BUN 10 9 9 8 10   CREATININE  0.56 0.62 0.52 0.60 0.68  CALCIUM 8.3* 8.3* 8.3* 8.9 8.9  MG 1.9 1.6* 1.6* 2.0 1.9  PHOS 2.4* 2.5 2.2* 3.1 2.9   GFR: CrCl cannot be calculated (Unknown ideal weight.). Liver Function Tests: Recent Labs  Lab 02/12/19 0619 02/13/19 0542 02/14/19 0526 02/15/19 0518 02/16/19 0434  AST 39 28 33 30 25  ALT 63* 47* 43 38 35  ALKPHOS 48 52 55 52 51  BILITOT 0.6 0.5 0.8 0.9 0.5  PROT 6.4* 6.4* 6.6 6.5 6.9  ALBUMIN 3.2* 3.2* 3.3* 3.3* 3.2*   No results for input(s): LIPASE, AMYLASE in the last 168 hours. Recent Labs  Lab 02/14/19 1505  AMMONIA 24   Coagulation Profile: No results for input(s): INR, PROTIME in the last 168 hours. Cardiac Enzymes: No results for input(s): CKTOTAL, CKMB, CKMBINDEX, TROPONINI in the last 168 hours. BNP (last 3 results) No results for input(s): PROBNP in the last 8760 hours. HbA1C: No results for input(s): HGBA1C in the last 72 hours. CBG: Recent Labs  Lab 02/10/19 1334  GLUCAP 90   Lipid Profile: No results for input(s): CHOL, HDL, LDLCALC, TRIG, CHOLHDL, LDLDIRECT in the last 72 hours. Thyroid Function Tests: No results for input(s): TSH, T4TOTAL, FREET4, T3FREE, THYROIDAB in the last 72 hours. Anemia Panel: No results for input(s): VITAMINB12, FOLATE, FERRITIN, TIBC, IRON, RETICCTPCT in the last 72 hours. Sepsis Labs: No results for input(s): PROCALCITON, LATICACIDVEN in the last 168 hours.  Recent Results (from the past 240 hour(s))  Culture, blood (routine x 2)     Status: None   Collection Time: 02/11/19  9:07 AM  Result Value Ref Range Status   Specimen Description   Final    BLOOD RIGHT ARM Performed at [facility], [address] [place], [place] [address]    Special Requests   Final    BOTTLES DRAWN AEROBIC AND ANAEROBIC Blood Culture adequate volume Performed at [facility], [address] [place], [place] [address]    Culture   Final    NO GROWTH 5 DAYS Performed at [facility],  [address], [place], [place] [address]    Report Status 02/16/2019 FINAL  Final  Culture, blood (routine x 2)     Status: None   Collection Time: 02/11/19  9:07 AM  Result Value Ref Range Status   Specimen Description   Final    BLOOD RIGHT HAND Performed at [facility], [address] [place], [place] [address]    Special Requests   Final    BOTTLES DRAWN AEROBIC ONLY Blood Culture results may not be optimal due to an inadequate volume of blood received in culture bottles Performed at [facility], [address] [place], [place] [address]    Culture   Final    NO GROWTH 5 DAYS Performed at [facility], [address] [place], [place] [address]    Report Status 02/16/2019 FINAL  Final  Culture, Urine     Status: None   Collection Time: 02/12/19  6:26 AM  Result Value Ref Range Status   Specimen Description   Final    URINE, RANDOM Performed at [facility], [address], [place], [place] [address]    Special Requests   Final    NONE Performed at [facility], [address], [place], [place] [address]    Culture   Final    NO GROWTH Performed at [facility], [address] [place], [place] [address]    Report Status 02/13/2019 FINAL  Final         Radiology Studies: No results found.      Scheduled Meds: . carbamazepine  200 mg Oral TID  . cholecalciferol  1,000 Units Oral Daily  . enoxaparin (LOVENOX) injection  40 mg Subcutaneous Q24H  . feeding supplement  1 Container Oral Q24H  . feeding supplement (ENSURE ENLIVE)  237 mL Oral BID BM  . levothyroxine  50 mcg Oral Q0600  . metoprolol succinate  25 mg Oral Daily  . multivitamin with minerals  1 tablet Oral Daily  . sodium chloride flush  10-40 mL Intracatheter Q12H  . sodium chloride flush  3 mL Intravenous Once   Continuous Infusions:   LOS: 6 days    Time spent: 35 mins    Ramiro Harvest, MD [facility]  If 7PM-7AM, please contact night-coverage [email or web address] 02/17/2019, 10:52 AM

## 2019-02-17 NOTE — Progress Notes (Signed)
COVID test from 02/15/2019 was collected and sent to lab

## 2019-02-17 NOTE — Progress Notes (Signed)
Physical Therapy Treatment Patient Details Name: Melanie Richardson MRN: [id number] DOB: 06/04/1951 Today's Date: 02/17/2019    History of Present Illness Pt admitted with generalized weakness 2* dehydration and UTI.  Pt with hx of MS     PT Comments    Pt awake moaning and rocking in bed.  Pt is AxOx4 aware she is in hospital.  Pt stated she likes to "hum" , "it keeps me calm".  Initially pt did not want to get OOB.  Breakfast tray at bed side untouched.  Max encouragement to get OOB for lunch.  Pt incont urine.  Strongly encouraged pt to get OOB to [facility].  Resistant.  Pt required + 2 assist.  No c/o pain.  Assisted from bed to [facility] then to recliner.  Pt unwilling to attempt any steps.  Positioned in recliner and set up for lunch.  Pt was able to feed self but appeared uninterested.     Follow Up Recommendations  SNF     Equipment Recommendations       Recommendations for Other Services       Precautions / Restrictions Precautions Precautions: Fall Precaution Comments: MS but can amb short distances  Restrictions Weight Bearing Restrictions: No    Mobility  Bed Mobility Overal bed mobility: Needs Assistance       Supine to sit: Total assist;+2 for physical assistance;+2 for safety/equipment     General bed mobility comments: pt non compliant and resistant.  "Just let me lay here". Required MAX encouragement.    Transfers Overall transfer level: Needs assistance Equipment used: None Transfers: Stand Pivot Transfers;Sit to/from Stand Sit to Stand: +2 physical assistance;Max assist;Total assist Stand pivot transfers: +2 physical assistance;Total assist       General transfer comment: + 2 side by side hand held assist with 75% VC's for safety with turns as pt attempted to sit prior to reaching toilet.  Increased anxiety.  Near panic.    Ambulation/Gait         Gait velocity: decreased    General Gait Details: non compliant and unwilling   Stairs              Wheelchair Mobility    Modified Rankin (Stroke Patients Only)       Balance                                            Cognition Arousal/Alertness: Awake/alert Behavior During Therapy: Anxious                                   General Comments: AxOx4 but slow to respond, rocking and moaning most time      Exercises      General Comments        Pertinent Vitals/Pain Pain Assessment: Faces Pain Location: general Pain Descriptors / Indicators: Moaning Pain Intervention(s): Monitored during session    Home Living                      Prior Function            PT Goals (current goals can now be found in the care plan section) Progress towards PT goals: Progressing toward goals    Frequency    Min 2X/week      PT Plan Current  plan remains appropriate    Co-evaluation              AM-PAC PT "6 Clicks" Mobility   Outcome Measure  Help needed turning from your back to your side while in a flat bed without using bedrails?: Total Help needed moving from lying on your back to sitting on the side of a flat bed without using bedrails?: Total Help needed moving to and from a bed to a chair (including a wheelchair)?: Total Help needed standing up from a chair using your arms (e.g., wheelchair or bedside chair)?: Total Help needed to walk in hospital room?: Total Help needed climbing 3-5 steps with a railing? : Total 6 Click Score: 6    End of Session Equipment Utilized During Treatment: Gait belt Activity Tolerance: Other (comment)(limited by self) Patient left: in chair;with call bell/phone within reach;with chair alarm set Nurse Communication: Mobility status PT Visit Diagnosis: Muscle weakness (generalized) (M62.81);History of falling (Z91.81);Difficulty in walking, not elsewhere classified (R26.2)     Time: 1135-1200 PT Time Calculation (min) (ACUTE ONLY): 25 min  Charges:  $Therapeutic Activity:  23-37 mins                     Felecia ShellingLori Alex Mcmanigal  PTA Acute  Rehabilitation Services Pager      [phone] Office      [phone]

## 2019-02-18 LAB — NOVEL CORONAVIRUS, NAA (HOSP ORDER, SEND-OUT TO REF LAB; TAT 18-24 HRS): SARS-CoV-2, NAA: NOT DETECTED

## 2019-02-18 MED ORDER — IBUPROFEN 200 MG PO TABS
400.0000 mg | ORAL_TABLET | Freq: Four times a day (QID) | ORAL | Status: DC | PRN
  Administered 2019-02-18 – 2019-02-19 (×3): 400 mg via ORAL
  Filled 2019-02-18 (×3): qty 2

## 2019-02-18 NOTE — TOC Progression Note (Signed)
Transition of Care [facility]) - Progression Note    Patient Details  Name: Melanie Richardson MRN: [id number] Date of Birth: 1951/02/03  [facility] CM/SW Contact  Coralyn Helling, [place] Phone Number: 02/18/2019, 3:35 PM  Clinical Narrative:  LCSW spoke with son, [name] [phone] who is concerned that his mom will decline SNF. Patient has no help at home and is not able to care for herself. Son is willing to help convince pt if necessary.      Expected Discharge Plan: Skilled Nursing Facility Barriers to Discharge: Continued Medical Work up  Expected Discharge Plan and Services Expected Discharge Plan: Skilled Nursing Facility In-house Referral: NA Discharge Planning Services: NA   Living arrangements for the past 2 months: Single Family Home                 DME Arranged: N/A DME Agency: NA                   Social Determinants of Health (SDOH) Interventions    Readmission Risk Interventions Readmission Risk Prevention Plan 02/18/2019  Transportation Screening Complete  Some recent data might be hidden

## 2019-02-18 NOTE — TOC Initial Note (Signed)
Transition of Care [facility]) - Initial/Assessment Note    Patient Details  Name: Melanie Richardson MRN: [id number] Date of Birth: [birth year]  [facility]) CM/SW Contact:    Servando Snare, LCSW Phone Number: 02/18/2019, 2:15 PM  Clinical Narrative:         LCSW met with patient. Patient is hesitant to go to SNF and states that she prefers to go home, but will think about it. Patient's son prefers for patient to go to SNF and patient has a bed at [facility].  Patient lives alone. Patient uses a walker at home and does not have any stairs in the home or entering her home. Patient reports she was at home and independent in her ADLs prior to admission.  APS is involved.             Expected Discharge Plan: Skilled Nursing Facility Barriers to Discharge: Continued Medical Work up   Patient Goals and CMS Choice Patient states their goals for this hospitalization and ongoing recovery are:: go home CMS Medicare.gov Compare Post Acute Care list provided to:: Patient Represenative (must comment) Choice offered to / list presented to : Adult Children  Expected Discharge Plan and Services Expected Discharge Plan: [place] In-house Referral: NA Discharge Planning Services: NA   Living arrangements for the past 2 months: Single Family Home                 DME Arranged: N/A DME Agency: NA                  Prior Living Arrangements/Services Living arrangements for the past 2 months: Single Family Home Lives with:: Self Patient language and need for interpreter reviewed:: No Do you feel safe going back to the place where you live?: Yes      Need for Family Participation in Patient Care: Yes (Comment) Care giver support system in place?: Yes (comment) Current home services: DME Criminal Activity/Legal Involvement Pertinent to Current Situation/Hospitalization: No - Comment as needed  Activities of Daily Living Home Assistive Devices/Equipment:  Wheelchair, Environmental consultant (specify type) ADL Screening (condition at time of admission) Patient's cognitive ability adequate to safely complete daily activities?: Yes Is the patient deaf or have difficulty hearing?: No Does the patient have difficulty seeing, even when wearing glasses/contacts?: No Does the patient have difficulty concentrating, remembering, or making decisions?: No Patient able to express need for assistance with ADLs?: Yes Does the patient have difficulty dressing or bathing?: Yes Independently performs ADLs?: No Communication: Independent Dressing (OT): Needs assistance Grooming: Needs assistance Is this a change from baseline?: Pre-admission baseline Feeding: Independent Bathing: Needs assistance Is this a change from baseline?: Pre-admission baseline Toileting: Needs assistance Is this a change from baseline?: Pre-admission baseline In/Out Bed: Needs assistance Is this a change from baseline?: Pre-admission baseline Walks in Home: Needs assistance Is this a change from baseline?: Pre-admission baseline Does the patient have difficulty walking or climbing stairs?: Yes Weakness of Legs: Both Weakness of Arms/Hands: Both  Permission Sought/Granted Permission sought to share information with : Facility Sport and exercise psychologist, Family Supports Permission granted to share information with : Yes, Verbal Permission Granted  Share Information with NAME: [name]  Permission granted to share info w AGENCY: SNF  Permission granted to share info w Relationship: son     Emotional Assessment Appearance:: Appears stated age Attitude/Demeanor/Rapport: Engaged Affect (typically observed): Calm Orientation: : Oriented to Self, Oriented to Place, Oriented to Situation, Oriented to  Time Alcohol / Substance Use:  Not Applicable Psych Involvement: No (comment)  Admission diagnosis:  Weakness [R53.1] Patient Active Problem List   Diagnosis Date Noted  . Somnolence   . Normocytic  anemia 02/12/2019  . UTI (urinary tract infection) 02/11/2019  . Hypokalemia 02/11/2019  . Hyperphosphatemia 02/11/2019  . Abnormal LFTs 02/11/2019  . Leukocytosis 02/11/2019  . Hyperglycemia 02/11/2019  . Dehydration 02/10/2019  . 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 High risk medication use 02/04/2017  . Right-sided Bell's palsy 12/29/2016  . Allergic rhinitis 08/07/2015  . Hypertension 08/06/2015  . Hypertriglyceridemia 08/06/2015  . Anxiety state 07/25/2015  . Dysesthesia 10/26/2014  . Abnormality of gait 10/26/2014  . Weakness 10/26/2014  . Sinusitis, acute 07/17/2013  . Wheezing 07/17/2013  . Hypothyroidism 07/17/2013  . MS (multiple sclerosis) ([place])   . Trigeminal neuralgia    PCP:  Alycia Rossetti, MD Pharmacy:   [id number] [place] [id number]: [phone]: [phone]    Social Determinants of Health (SDOH) Interventions    Readmission Risk Interventions Readmission Risk Prevention Plan 02/18/2019  Transportation Screening Complete  Some recent data might be hidden

## 2019-02-18 NOTE — Plan of Care (Signed)

## 2019-02-18 NOTE — Care Management Important Message (Signed)
Important Message  Patient Details IM Letter given to Coralyn Helling SW to present to the Patient Name: Melanie Richardson MRN: [id number] Date of Birth: 11/25/1950   Medicare Important Message Given:  Yes    Caren Macadam 02/18/2019, 10:08 AM

## 2019-02-18 NOTE — Progress Notes (Signed)
PROGRESS NOTE    Melanie Richardson  XTG:[id number] DOB: 1950-10-19 DOA: 02/10/2019 PCP: Salley Scarlet, MD    Brief Narrative:  HPI per Dr. Jerelene Redden on 02/10/2019 Melanie Richardson a 68 y.o.femalewith medical history significant ofmultiple sclerosis came in with complaints of generalized weakness. She has been weak over the last 1 week with increasing episodes of urinary incontinence which was unusual for her. She denied any burning urination. She denied any fever chest pain shortness of breath cough nausea vomiting diarrhea abdominal pain. At baseline she walks with a walker. However she has a wheelchair at home for longer distances. She has aides checking on her several times during the day but she lives alone. Her appetite decreased p.o. intake and dizziness upon standing.  **Interim History  Worked with physical therapy and they recommending skilled nursing facility. Discussed with the patient about going to a skilled nursing facility and she does not know if she can or not and did not participate with physical therapy yesterday despite maximum encouragement. On 02/15/2019 she was somnolent and extremely drowsy and somewhat difficult to arouse so sedating medications have been held and CT head and ABG was obtained.  Patient also pancultured.  Patient placed on IV fluids due to weakness.     Assessment & Plan:   Active Problems:   MS (multiple sclerosis) (HCC)   Trigeminal neuralgia   Hypothyroidism   Weakness   Anxiety state   Hypertension   Dehydration   UTI (urinary tract infection)   Hypokalemia   Hyperphosphatemia   Abnormal LFTs   Leukocytosis   Hyperglycemia   Normocytic anemia   Somnolence  1 generalized weakness likely secondary to dehydration from poor oral intake in the setting of UTI Patient still with significant weakness.  Patient was changed to inpatient status given profound weakness from dehydration requiring maintenance IV fluids at 100 cc/h.   Patient pancultured with no growth to date.  Patient on IV Rocephin and has completed 5 days of antibiotic treatment.  Patient also noted to be orthostatic.  Patient on nutritional supplementation.  Electrolytes have been repleted.  Potassium at 3.9, magnesium at 2.0, phosphorus at 3.1.  Due to patient's somnolence amitriptyline has been discontinued and sedation medications held.  Pain medication changed to as needed.  PT/OT.  Requires skilled nursing facility.  Encourage oral intake.  Follow.  2.  Somnolence Likely medication induced.  Patient noted over the past few days noted to be somnolent, lethargic, drowsy and difficult to arouse usually in the mornings however patient more alert in the afternoons following commands.  Patient more alert this morning and following commands.  ABG done with a pH of 7.48, PCO2 of 26, PO2 of 77, bicarb of 19 with a O2 sats of 96.2%.  Patient's amitriptyline has been discontinued and scheduled narcotic pain medication change to as needed.  TSH within normal limits.  Patient pancultured with no growth to date.  RPR nonreactive, vitamin B12 within normal limits at 849.  CT head negative for any acute abnormalities.  Free carbamazepine level was 2.2.  Total carbamazepine level was 9.9.  [name] discussed with neurology, [name] who recommended decreasing carbamazepine dose and holding scheduled narcotics.  Patient noted to be drowsy usually in the mornings however more alert and cooperative and following commands in the afternoons.  Follow.   3.  Suspected urinary tract infection Urinalysis done on admission was concerning for UTI.  Urine cultures were never drawn on admission however obtained after  patient was on antibiotics and subsequently with no growth to date.  Patient pancultured and blood cultures with no growth to date.  Patient status post 5 days IV Rocephin.  Patient currently afebrile.  Follow.  4.  History of MS/trigeminal neuralgia Amitriptyline 50  mg p.o. nightly was discontinued due to somnolence.  Carbamazepine has been changed from 400 mg 3 times daily to 200 mg 3 times daily per neuro recommendations.  Diazepam has been changed to as needed.  Will need outpatient follow-up with neurology.  5.  Hypertension Blood pressure trolled on current regimen of Toprol-XL.  Patient's home regimen of HCTZ and Cozaar have been held throughout the hospitalization.  Outpatient follow-up.  6.  Hypothyroidism TSH at 4.762.  Free T4 0.98.  Synthroid.  Outpatient follow-up.   7.  Osteopenia Continue cholecalciferol 1000 units daily.  8.  Hypokalemia/hypophosphatemia/hypomagnesemia Potassium repleted and currently at 3.8.  Magnesium at 1.9.  Phosphorus at 2.9.  9.  Metabolic acidosis Improving.  Follow.    10.  Abnormal LFTs Questionable etiology.  Right upper quadrant ultrasound with no acute sonographic abnormalities noted.  Acute hepatitis panel was negative.  LFTs trended down.  Outpatient follow-up.  11.  Normocytic anemia Likely dilutional component.  No overt bleeding.  Hemoglobin stable at 11.2.  Outpatient follow-up.    DVT prophylaxis: Lovenox Code Status: Full Family Communication: Updated patient.  Disposition Plan: SNF when bed available hopefully tomorrow.    Consultants:   None  Procedures:   CT head 02/14/2019  Right upper quadrant ultrasound 02/11/2019  Antimicrobials:  IV Rocephin 02/10/2019>>>>> 02/15/2019   Subjective: Patient more alert this morning.  Patient denies any chest pain or shortness of breath.  Patient hesitant to go to SNF stating that she will need to speak with her son first.  No chest pain.  Objective: Vitals:   02/17/19 0528 02/17/19 1350 02/17/19 2102 02/18/19 0357  BP: (!) 124/59 121/74 129/81 124/85  Pulse: 87 100 97 80  Resp: Temp: 99.2 F (37.3 C) (!) 97.4 F (36.3 C) 97.9 F (36.6 C) 98.1 F (36.7 C)  TempSrc: Oral   Oral  SpO2: 96% 100% 99% 98%  Weight:         Intake/Output Summary (Last 24 hours) at 02/18/2019 1130 Last data filed at 02/18/2019 0900 Gross per 24 hour  Intake 240 ml  Output 300 ml  Net -60 ml   Filed Weights   02/13/19 1541  Weight: 58.4 kg    Examination:  General exam: Alert. Respiratory system: Lungs clear to auscultation bilaterally.  No wheezes, no crackles, no rhonchi.  Normal respiratory effort.   Cardiovascular system: RRR no murmurs rubs or gallops.  No JVD.  No lower extremity edema. Gastrointestinal system: Abdomen is soft, nontender, nondistended, positive bowel sounds.  No rebound.  No guarding.    Central nervous system: Patient alert this morning.  Patient following commands.  No focal neurological deficits.  Moving extremities spontaneously.   Extremities: Symmetric 5 x 5 power. Skin: No rashes, lesions or ulcers Psychiatry: Judgement and insight appear normal. Mood & affect appropriate.     Data Reviewed: I have personally reviewed following labs and imaging studies  CBC: Recent Labs  Lab 02/13/19 0542 02/14/19 0526 02/15/19 0518 02/16/19 0434 02/17/19 0306  WBC 7.8 8.7 9.3 10.0 9.6  NEUTROABS 4.9 5.7 5.9 6.5 5.9  HGB 11.5* 11.2* 11.0* 11.8* 11.2*  HCT 34.7* 32.4* 32.7* 33.9* 33.0*  MCV 96.1 92.8 92.6 92.1 93.2  PLT 211 216 225 255 265   Basic Metabolic Panel: Recent Labs  Lab 02/13/19 0542 02/14/19 0526 02/15/19 0518 02/16/19 0434 02/17/19 0306  NA 142 138 135 137 136  K 3.6 3.0* 4.0 3.9 3.8  CL 110 104 104 105 104  CO2 22 20* 20* 21* 21*  GLUCOSE 75 77 84 99 97  BUN CREATININE 0.56 0.62 0.52 0.60 0.68  CALCIUM 8.3* 8.3* 8.3* 8.9 8.9  MG 1.9 1.6* 1.6* 2.0 1.9  PHOS 2.4* 2.5 2.2* 3.1 2.9   GFR: CrCl cannot be calculated (Unknown ideal weight.). Liver Function Tests: Recent Labs  Lab 02/12/19 0619 02/13/19 0542 02/14/19 0526 02/15/19 0518 02/16/19 0434  AST 39 28 33 30 25  ALT 63* 47* 43 38 35  ALKPHOS 48 52 55 52 51  BILITOT 0.6 0.5 0.8 0.9 0.5   PROT 6.4* 6.4* 6.6 6.5 6.9  ALBUMIN 3.2* 3.2* 3.3* 3.3* 3.2*   No results for input(s): LIPASE, AMYLASE in the last 168 hours. Recent Labs  Lab 02/14/19 1505  AMMONIA 24   Coagulation Profile: No results for input(s): INR, PROTIME in the last 168 hours. Cardiac Enzymes: No results for input(s): CKTOTAL, CKMB, CKMBINDEX, TROPONINI in the last 168 hours. BNP (last 3 results) No results for input(s): PROBNP in the last 8760 hours. HbA1C: No results for input(s): HGBA1C in the last 72 hours. CBG: No results for input(s): GLUCAP in the last 168 hours. Lipid Profile: No results for input(s): CHOL, HDL, LDLCALC, TRIG, CHOLHDL, LDLDIRECT in the last 72 hours. Thyroid Function Tests: No results for input(s): TSH, T4TOTAL, FREET4, T3FREE, THYROIDAB in the last 72 hours. Anemia Panel: No results for input(s): VITAMINB12, FOLATE, FERRITIN, TIBC, IRON, RETICCTPCT in the last 72 hours. Sepsis Labs: No results for input(s): PROCALCITON, LATICACIDVEN in the last 168 hours.  Recent Results (from the past 240 hour(s))  Culture, blood (routine x 2)     Status: None   Collection Time: 02/11/19  9:07 AM  Result Value Ref Range Status   Specimen Description   Final    BLOOD RIGHT ARM Performed at [facility], [address], [place], [place] [address]    Special Requests   Final    BOTTLES DRAWN AEROBIC AND ANAEROBIC Blood Culture adequate volume Performed at [facility], [address], [place], [place] [address]    Culture   Final    NO GROWTH 5 DAYS Performed at [facility], [address], [place], [place] [address]    Report Status 02/16/2019 FINAL  Final  Culture, blood (routine x 2)     Status: None   Collection Time: 02/11/19  9:07 AM  Result Value Ref Range Status   Specimen Description   Final    BLOOD RIGHT HAND Performed at [facility], [address], [place], [place] [address]    Special Requests   Final    BOTTLES  DRAWN AEROBIC ONLY Blood Culture results may not be optimal due to an inadequate volume of blood received in culture bottles Performed at [facility], [address] [place], [place] [address]    Culture   Final    NO GROWTH 5 DAYS Performed at [facility], [address] [place], [place] [address]    Report Status 02/16/2019 FINAL  Final  Culture, Urine     Status: None   Collection Time: 02/12/19  6:26 AM  Result  Value Ref Range Status   Specimen Description   Final    URINE, RANDOM Performed at [facility], [address], [place], [place] [address]    Special Requests   Final    NONE Performed at [facility], [address], [place], [place] [address]    Culture   Final    NO GROWTH Performed at [facility], [address] [place], [place] [address]    Report Status 02/13/2019 FINAL  Final         Radiology Studies: No results found.      Scheduled Meds:  carbamazepine  200 mg Oral TID   cholecalciferol  1,000 Units Oral Daily   enoxaparin (LOVENOX) injection  40 mg Subcutaneous Q24H   feeding supplement  1 Container Oral Q24H   feeding supplement (ENSURE ENLIVE)  237 mL Oral BID BM   levothyroxine  50 mcg Oral Q0600   metoprolol succinate  25 mg Oral Daily   multivitamin with minerals  1 tablet Oral Daily   sodium chloride flush  10-40 mL Intracatheter Q12H   sodium chloride flush  3 mL Intravenous Once   Continuous Infusions:   LOS: 7 days    Time spent: 35 mins    Ramiro Harvest, MD [facility]  If 7PM-7AM, please contact night-coverage [email or web address] 02/18/2019, 11:30 AM

## 2019-02-19 MED ORDER — MUSCLE RUB 10-15 % EX CREA: 1.0000 "application " | TOPICAL_CREAM | CUTANEOUS | 0 refills | Status: DC | PRN

## 2019-02-19 MED ORDER — HYDROCODONE-ACETAMINOPHEN 7.5-325 MG PO TABS: 1.0000 | ORAL_TABLET | Freq: Three times a day (TID) | ORAL | 0 refills | Status: DC | PRN

## 2019-02-19 MED ORDER — CARBAMAZEPINE ER 200 MG PO TB12: 200.0000 mg | ORAL_TABLET | Freq: Three times a day (TID) | ORAL | 0 refills | Status: DC

## 2019-02-19 MED ORDER — DIAZEPAM 5 MG PO TABS: 5.0000 mg | ORAL_TABLET | Freq: Two times a day (BID) | ORAL | 0 refills | Status: DC | PRN

## 2019-02-19 MED ORDER — IBUPROFEN 400 MG PO TABS: 400.0000 mg | ORAL_TABLET | Freq: Four times a day (QID) | ORAL | 0 refills | Status: DC | PRN

## 2019-02-19 MED ORDER — ENSURE ENLIVE PO LIQD: 237.0000 mL | Freq: Two times a day (BID) | ORAL | 12 refills | Status: DC

## 2019-02-19 NOTE — Discharge Summary (Signed)
Physician Discharge Summary  ROXY MASTANDREA ZOX:[id number] DOB: Jun 02, 1951 DOA: 02/10/2019  PCP: Salley Scarlet, MD  Admit date: 02/10/2019 Discharge date: 02/19/2019  Time spent: 60 minutes  Recommendations for Outpatient Follow-up:  1. Patient with discharge to skilled nursing facility at [facility].  Patient will follow-up with MD at [facility].  Patient will need a basic metabolic profile, CBC, magnesium level, phosphorus level done in 1 week to follow-up on electrolytes and renal function. 2. Follow-up with [name], neurology in 3 to 4 weeks.   Discharge Diagnoses:  Active Problems:   MS (multiple sclerosis) (HCC)   Trigeminal neuralgia   Hypothyroidism   Weakness   Anxiety state   Hypertension   Dehydration   UTI (urinary tract infection)   Hypokalemia   Hyperphosphatemia   Abnormal LFTs   Leukocytosis   Hyperglycemia   Normocytic anemia   Somnolence   Discharge Condition: Stable and improved  Diet recommendation: Regular  Filed Weights   02/13/19 1541  Weight: 58.4 kg    History of present illness:  Per Dr Christy Gentles D Kirkeby is a 68 y.o. female with medical history significant of multiple sclerosis came in with complaints of generalized weakness.  She had been weak over the last 1 week with increasing episodes of urinary incontinence which was unusual for her.  She denied any burning urination.  She denied any fever chest pain shortness of breath cough nausea vomiting diarrhea abdominal pain.  At baseline she walks with a walker.  However she has a wheelchair at home for longer distances.  She has aides checking on her several times during the day but she lives alone.  Her appetite decreased p.o. intake and dizziness upon standing.   ED Course: Temperature 98.2 pulse rate 104 blood pressure 131/96 saturation 99 on room air sodium 139 potassium 3.4 BUN 39 creatinine 0.94 WBC 13.5 hemoglobin 13.5 platelet count 237.  UA appears hazy with 20  ketones but negative leukocytes and nitrates rare bacteria 11-20 WBCs.  Hospital Course:  1 generalized weakness likely secondary to dehydration from poor oral intake in the setting of UTI Patient presented with generalized weakness and noted to be significantly weak during the hospitalization. Patient was changed to inpatient status given profound weakness from dehydration requiring maintenance IV fluids at 100 cc/h.  Patient pancultured with no growth to date.  Patient was on IV Rocephin and has completed 5 days of antibiotic treatment.  Patient also noted to be orthostatic.  Patient on nutritional supplementation.  Electrolytes have been repleted.  Potassium at 3.9, magnesium at 2.0, phosphorus at 3.1.  Due to patient's somnolence amitriptyline has been discontinued and sedation medications held.  Pain medication changed to as needed.  PT/OT.  Requires skilled nursing facility.  Encouraged oral intake.  Patient be discharged to skilled nursing facility.  2.  Somnolence Likely medication induced.  Patient noted over the past few days noted to be somnolent, lethargic, drowsy and difficult to arouse usually in the mornings however patient more alert in the afternoons following commands.  Patient more alert the morning of discharge.  ABG done with a pH of 7.48, PCO2 of 26, PO2 of 77, bicarb of 19 with a O2 sats of 96.2%.  Patient's amitriptyline has been discontinued and scheduled narcotic pain medication change to as needed.  TSH within normal limits.  Patient pancultured with no growth to date.  RPR nonreactive, vitamin B12 within normal limits at 849.  CT head negative for any  acute abnormalities.  Free carbamazepine level was 2.2.  Total carbamazepine level was 9.9.  [name] discussed with neurology, [name] who recommended decreasing carbamazepine dose and holding scheduled narcotics.  Patient noted to be drowsy usually in the mornings however more alert and cooperative and following commands  in the afternoons.    3.  Suspected urinary tract infection Urinalysis done on admission was concerning for UTI.  Urine cultures were never drawn on admission however obtained after patient was on antibiotics and subsequently with no growth to date.  Patient pancultured and blood cultures with no growth to date.  Patient status post 5 days IV Rocephin.  Patient remained afebrile and will be discharged in stable and improved condition  4.  History of MS/trigeminal neuralgia Amitriptyline 50 mg p.o. nightly was discontinued due to somnolence.  Carbamazepine has been changed from 400 mg 3 times daily to 200 mg 3 times daily per neuro recommendations.  Diazepam has been changed to as needed.  Will need outpatient follow-up with neurology, [name].  5.  Hypertension Blood pressure  remained controlled on her home regimen of Toprol-XL.  Patient's home regimen of HCTZ and Cozaar have been held throughout the hospitalization.  Patient's HCTZ and Cozaar will not be resumed on discharge. Outpatient follow-up.  6.  Hypothyroidism TSH at 4.762.  Free T4 0.98.    Patient maintained on home regimen Synthroid.  Outpatient follow-up.    7.  Osteopenia Patient maintained on cholecalciferol 1000 units daily.  8.  Hypokalemia/hypophosphatemia/hypomagnesemia Repleted by day of discharge.  Potassium was 3.8, magnesium at 1.9, phosphorus at 2.9.  Outpatient follow-up.  9.  Metabolic acidosis Improved.  10.  Abnormal LFTs Questionable etiology.  Right upper quadrant ultrasound with no acute sonographic abnormalities noted.  Acute hepatitis panel was negative.  LFTs trended down.  Outpatient follow-up.  11.  Normocytic anemia Likely dilutional component.  No overt bleeding.  Hemoglobin stabilized at 11.2.  Outpatient follow-up.    Procedures:  CT head 02/14/2019  Right upper quadrant ultrasound 02/11/2019  Consultations:  None  Discharge Exam: Vitals:   02/19/19 0452 02/19/19 0824  BP: (!)  133/97   Pulse: 77   Resp: 17   Temp: (!) 97.5 F (36.4 C)   SpO2: 99% 94%    General: NAD Cardiovascular: RRR Respiratory: CTAB  Discharge Instructions   Discharge Instructions    Diet general   Complete by:  As directed    Increase activity slowly   Complete by:  As directed      Allergies as of 02/19/2019      Reactions   Ciprofloxacin Other (See Comments)   Dizziness and headache   Codeine Other (See Comments)   GI upset   Erythromycin Other (See Comments)   Unknown      Medication List    STOP taking these medications   amitriptyline 50 MG tablet Commonly known as:  ELAVIL   hydrochlorothiazide 25 MG tablet Commonly known as:  HYDRODIURIL   losartan 100 MG tablet Commonly known as:  COZAAR   meloxicam 7.5 MG tablet Commonly known as:  Mobic   methylPREDNISolone 4 MG Tbpk tablet Commonly known as:  MEDROL DOSEPAK   pregabalin 75 MG capsule Commonly known as:  LYRICA     TAKE these medications   albuterol 108 (90 Base) MCG/ACT inhaler Commonly known as:  VENTOLIN HFA Inhale 2 puffs into the lungs every 6 (six) hours as needed for wheezing.   aspirin 81 MG tablet Take 81  mg by mouth daily.   carbamazepine 200 MG 12 hr tablet Commonly known as:  TEGRETOL XR Take 1 tablet (200 mg total) by mouth 3 (three) times daily. What changed:    medication strength  how much to take   cycloSPORINE 0.05 % ophthalmic emulsion Commonly known as:  RESTASIS Place 1 drop into both eyes 2 (two) times daily.   diazepam 5 MG tablet Commonly known as:  VALIUM Take 1 tablet (5 mg total) by mouth every 12 (twelve) hours as needed for anxiety.   feeding supplement (ENSURE ENLIVE) Liqd Take 237 mLs by mouth 2 (two) times daily between meals.   fluticasone 50 MCG/ACT nasal spray Commonly known as:  FLONASE SPRAY 2 SPRAYS INTO EACH NOSTRIL EVERY DAY What changed:    how much to take  how to take this  when to take this  reasons to take  this  additional instructions   HYDROcodone-acetaminophen 7.5-325 MG tablet Commonly known as:  NORCO Take 1 tablet by mouth every 8 (eight) hours as needed for moderate pain.   ibuprofen 400 MG tablet Commonly known as:  ADVIL Take 1 tablet (400 mg total) by mouth every 6 (six) hours as needed for fever, headache or mild pain.   ipratropium 0.06 % nasal spray Commonly known as:  ATROVENT Place 1 spray into the nose daily as needed for rhinitis.   levothyroxine 50 MCG tablet Commonly known as:  SYNTHROID Take 1 tablet daily before breakfast What changed:    how much to take  how to take this  when to take this  additional instructions   metoprolol succinate 25 MG 24 hr tablet Commonly known as:  TOPROL-XL TAKE 1 TABLET BY MOUTH EVERY DAY   multivitamin tablet Take 1 tablet by mouth daily.   Muscle Rub 10-15 % Crea Apply 1 application topically as needed for muscle pain.   Tecfidera 240 MG Cpdr Generic drug:  Dimethyl Fumarate TAKE ONE CAPSULE (240 MG) BY MOUTH TWICE DAILY. STORE IN ORIGINAL CONTAINER AT ROOM TEMPERATURE. What changed:    how much to take  how to take this  when to take this   VITAMIN D PO Take 1,000 Units by mouth daily.      Allergies  Allergen Reactions  . Ciprofloxacin Other (See Comments)    Dizziness and headache  . Codeine Other (See Comments)    GI upset  . Erythromycin Other (See Comments)    Unknown    Contact information for follow-up providers    Sater, Pearletha Furl, MD. Schedule an appointment as soon as possible for a visit in 4 week(s).   Specialty:  Neurology Why:  f/u in 3-4 weeks. Contact information: [address] [place] [address] [phone]            Contact information for after-discharge care    Destination    [facility] Preferred SNF .   Service:  Skilled Nursing Contact information: [address] [place] [address] [phone]                   The  results of significant diagnostics from this hospitalization (including imaging, microbiology, ancillary and laboratory) are listed below for reference.    Significant Diagnostic Studies: Ct Head Wo Contrast  Result Date: 02/14/2019 CLINICAL DATA:  Multiple sclerosis, weakness, increasing urinary incontinence EXAM: CT HEAD WITHOUT CONTRAST TECHNIQUE: Contiguous axial images were obtained from the base of the skull through the vertex without intravenous contrast. COMPARISON:  01/16/2017  FINDINGS: Brain: No evidence of acute infarction, hemorrhage, hydrocephalus, extra-axial collection or mass lesion/mass effect. Extensive periventricular and deep white matter hypodensity. Vascular: No hyperdense vessel or unexpected calcification. Skull: Normal. Negative for fracture or focal lesion. Sinuses/Orbits: No acute finding. Other: None. IMPRESSION: No acute intracranial pathology. Extensive periventricular and deep white matter hypodensity, in keeping with reported history of multiple sclerosis and or small-vessel white matter disease. Electronically Signed   By: Lauralyn Primes M.D.   On: 02/14/2019 16:01   US Abdomen Limited Ruq  Result Date: 02/11/2019 CLINICAL DATA:  Abnormal LFTs x2 days. EXAM: ULTRASOUND ABDOMEN LIMITED RIGHT UPPER QUADRANT COMPARISON:  None. FINDINGS: Gallbladder: No gallstones or wall thickening visualized. No sonographic Murphy sign noted by sonographer. Common bile duct: Diameter: 0.4 cm Liver: No focal lesion identified. Within normal limits in parenchymal echogenicity. Portal vein is patent on color Doppler imaging with normal direction of blood flow towards the liver. IMPRESSION: No acute sonographic abnormality detected. No sonographic evidence for cholelithiasis. Electronically Signed   By: Katherine Mantle M.D.   On: 02/11/2019 18:01    Microbiology: Recent Results (from the past 240 hour(s))  Culture, blood (routine x 2)     Status: None   Collection Time: 02/11/19  9:07 AM   Result Value Ref Range Status   Specimen Description   Final    BLOOD RIGHT ARM Performed at [facility], [address], [place], [place] [address]    Special Requests   Final    BOTTLES DRAWN AEROBIC AND ANAEROBIC Blood Culture adequate volume Performed at [facility], [address] [place], [place] [address]    Culture   Final    NO GROWTH 5 DAYS Performed at [facility], [address], [place], [place] [address]    Report Status 02/16/2019 FINAL  Final  Culture, blood (routine x 2)     Status: None   Collection Time: 02/11/19  9:07 AM  Result Value Ref Range Status   Specimen Description   Final    BLOOD RIGHT HAND Performed at [facility], [address], [place], [place] [address]    Special Requests   Final    BOTTLES DRAWN AEROBIC ONLY Blood Culture results may not be optimal due to an inadequate volume of blood received in culture bottles Performed at [facility], [address] [place], [place] [address]    Culture   Final    NO GROWTH 5 DAYS Performed at [facility], [address] [place], [place] [address]    Report Status 02/16/2019 FINAL  Final  Culture, Urine     Status: None   Collection Time: 02/12/19  6:26 AM  Result Value Ref Range Status   Specimen Description   Final    URINE, RANDOM Performed at [facility], [address] [place], [place] [address]    Special Requests   Final    NONE Performed at [facility], [address], [place], [place] [address]    Culture   Final    NO GROWTH Performed at [facility], [address] [place], [place] [address]    Report Status 02/13/2019 FINAL  Final  Novel Coronavirus, NAA (hospital order; send-out to ref lab)     Status: None   Collection Time: 02/17/19  3:04 PM  Result Value Ref Range Status   SARS-CoV-2, NAA NOT DETECTED NOT DETECTED Final  Comment: (NOTE) This  test was developed and its performance characteristics determined by [employer]. This test has not been FDA cleared or approved. This test has been authorized by FDA under an Emergency Use Authorization (EUA). This test is only authorized for the duration of time the declaration that circumstances exist justifying the authorization of the emergency use of in vitro diagnostic tests for detection of SARS-CoV-2 virus and/or diagnosis of COVID-19 infection under section 564(b)(1) of the Act, 21 U.S.C. 161WRU-0(A)(5), unless the authorization is terminated or revoked sooner. When diagnostic testing is negative, the possibility of a false negative result should be considered in the context of a patient's recent exposures and the presence of clinical signs and symptoms consistent with COVID-19. An individual without symptoms of COVID-19 and who is not shedding SARS-CoV-2 virus would expect to have a negative (not detected) result in this assay. Performed  At: [facility] [address] [place], [place] [id number] Jolene Schimke MD NW:[id number]    Coronavirus Source NASOPHARYNGEAL  Final    Comment: Performed at [facility], [address] [place], [place] [address]     Labs: Basic Metabolic Panel: Recent Labs  Lab 02/13/19 0542 02/14/19 0526 02/15/19 0518 02/16/19 0434 02/17/19 0306  NA 142 138 135 137 136  K 3.6 3.0* 4.0 3.9 3.8  CL 110 104 104 105 104  CO2 22 20* 20* 21* 21*  GLUCOSE 75 77 84 99 97  BUN 10 9 9 8 10   CREATININE 0.56 0.62 0.52 0.60 0.68  CALCIUM 8.3* 8.3* 8.3* 8.9 8.9  MG 1.9 1.6* 1.6* 2.0 1.9  PHOS 2.4* 2.5 2.2* 3.1 2.9   Liver Function Tests: Recent Labs  Lab 02/13/19 0542 02/14/19 0526 02/15/19 0518 02/16/19 0434  AST 28 33 30 25  ALT 47* 43 38 35  ALKPHOS 52 55 52 51  BILITOT 0.5 0.8 0.9 0.5  PROT 6.4* 6.6 6.5 6.9  ALBUMIN 3.2* 3.3* 3.3* 3.2*   No results for input(s): LIPASE, AMYLASE in the last 168  hours. Recent Labs  Lab 02/14/19 1505  AMMONIA 24   CBC: Recent Labs  Lab 02/13/19 0542 02/14/19 0526 02/15/19 0518 02/16/19 0434 02/17/19 0306  WBC 7.8 8.7 9.3 10.0 9.6  NEUTROABS 4.9 5.7 5.9 6.5 5.9  HGB 11.5* 11.2* 11.0* 11.8* 11.2*  HCT 34.7* 32.4* 32.7* 33.9* 33.0*  MCV 96.1 92.8 92.6 92.1 93.2  PLT 211 216 225 255 265   Cardiac Enzymes: No results for input(s): CKTOTAL, CKMB, CKMBINDEX, TROPONINI in the last 168 hours. BNP: BNP (last 3 results) No results for input(s): BNP in the last 8760 hours.  ProBNP (last 3 results) No results for input(s): PROBNP in the last 8760 hours.  CBG: No results for input(s): GLUCAP in the last 168 hours.     Signed:  Ramiro Harvest MD.  [facility] 02/19/2019, 1:49 PM

## 2019-02-20 DIAGNOSIS — R400 Somnolence: Secondary | ICD-10-CM | POA: Diagnosis not present

## 2019-02-20 DIAGNOSIS — F419 Anxiety disorder, unspecified: Secondary | ICD-10-CM | POA: Diagnosis not present

## 2019-02-20 DIAGNOSIS — R21 Rash and other nonspecific skin eruption: Secondary | ICD-10-CM | POA: Diagnosis not present

## 2019-02-20 DIAGNOSIS — E872 Acidosis: Secondary | ICD-10-CM | POA: Diagnosis not present

## 2019-02-20 DIAGNOSIS — R2689 Other abnormalities of gait and mobility: Secondary | ICD-10-CM | POA: Diagnosis not present

## 2019-02-20 DIAGNOSIS — G500 Trigeminal neuralgia: Secondary | ICD-10-CM | POA: Diagnosis not present

## 2019-02-20 DIAGNOSIS — G35 Multiple sclerosis: Secondary | ICD-10-CM | POA: Diagnosis not present

## 2019-02-20 DIAGNOSIS — F99 Mental disorder, not otherwise specified: Secondary | ICD-10-CM | POA: Diagnosis not present

## 2019-02-20 DIAGNOSIS — I10 Essential (primary) hypertension: Secondary | ICD-10-CM | POA: Diagnosis not present

## 2019-02-20 DIAGNOSIS — R262 Difficulty in walking, not elsewhere classified: Secondary | ICD-10-CM | POA: Diagnosis not present

## 2019-02-20 DIAGNOSIS — R5381 Other malaise: Secondary | ICD-10-CM | POA: Diagnosis not present

## 2019-02-20 DIAGNOSIS — R1312 Dysphagia, oropharyngeal phase: Secondary | ICD-10-CM | POA: Diagnosis not present

## 2019-02-20 DIAGNOSIS — Z7401 Bed confinement status: Secondary | ICD-10-CM | POA: Diagnosis not present

## 2019-02-20 DIAGNOSIS — R41841 Cognitive communication deficit: Secondary | ICD-10-CM | POA: Diagnosis not present

## 2019-02-20 DIAGNOSIS — R2681 Unsteadiness on feet: Secondary | ICD-10-CM | POA: Diagnosis not present

## 2019-02-20 DIAGNOSIS — R269 Unspecified abnormalities of gait and mobility: Secondary | ICD-10-CM | POA: Diagnosis not present

## 2019-02-20 DIAGNOSIS — E860 Dehydration: Secondary | ICD-10-CM | POA: Diagnosis not present

## 2019-02-20 DIAGNOSIS — E038 Other specified hypothyroidism: Secondary | ICD-10-CM | POA: Diagnosis not present

## 2019-02-20 DIAGNOSIS — F17200 Nicotine dependence, unspecified, uncomplicated: Secondary | ICD-10-CM | POA: Diagnosis not present

## 2019-02-20 DIAGNOSIS — M2550 Pain in unspecified joint: Secondary | ICD-10-CM | POA: Diagnosis not present

## 2019-02-20 DIAGNOSIS — E039 Hypothyroidism, unspecified: Secondary | ICD-10-CM | POA: Diagnosis not present

## 2019-02-20 DIAGNOSIS — D72829 Elevated white blood cell count, unspecified: Secondary | ICD-10-CM | POA: Diagnosis not present

## 2019-02-20 DIAGNOSIS — M6281 Muscle weakness (generalized): Secondary | ICD-10-CM | POA: Diagnosis not present

## 2019-02-20 DIAGNOSIS — R531 Weakness: Secondary | ICD-10-CM | POA: Diagnosis not present

## 2019-02-20 DIAGNOSIS — M8580 Other specified disorders of bone density and structure, unspecified site: Secondary | ICD-10-CM | POA: Diagnosis not present

## 2019-02-20 DIAGNOSIS — R410 Disorientation, unspecified: Secondary | ICD-10-CM | POA: Diagnosis not present

## 2019-02-20 DIAGNOSIS — R278 Other lack of coordination: Secondary | ICD-10-CM | POA: Diagnosis not present

## 2019-02-20 DIAGNOSIS — Z79899 Other long term (current) drug therapy: Secondary | ICD-10-CM | POA: Diagnosis not present

## 2019-02-20 NOTE — TOC Initial Note (Addendum)
Transition of Care [facility]) - Initial/Assessment Note    Patient Details  Name: Melanie Richardson MRN: [id number] Date of Birth: [birth year]  Transition of Care [facility]) CM/SW Contact:    Coralyn Helling, LCSW Phone Number: 02/20/2019, 11:38 AM  Clinical Narrative:      LCSW received a call from floor nurse stating patient is refusing SNF and want to speak to her son. LCSW called son, [name]. No answer. LCSW left message.            12:16PM- LCSW spoke with son. Patient to dc to SNF. He will meet her there. PTAR has been arranged.   Expected Discharge Plan: Skilled Nursing Facility Barriers to Discharge: Continued Medical Work up   Patient Goals and CMS Choice Patient states their goals for this hospitalization and ongoing recovery are:: go home CMS Medicare.gov Compare Post Acute Care list provided to:: Patient Represenative (must comment) Choice offered to / list presented to : Adult Children  Expected Discharge Plan and Services Expected Discharge Plan: Skilled Nursing Facility In-house Referral: NA Discharge Planning Services: NA   Living arrangements for the past 2 months: Single Family Home Expected Discharge Date: 02/19/19               DME Arranged: N/A DME Agency: NA                  Prior Living Arrangements/Services Living arrangements for the past 2 months: Single Family Home Lives with:: Self Patient language and need for interpreter reviewed:: No Do you feel safe going back to the place where you live?: Yes      Need for Family Participation in Patient Care: Yes (Comment) Care giver support system in place?: Yes (comment) Current home services: DME Criminal Activity/Legal Involvement Pertinent to Current Situation/Hospitalization: No - Comment as needed  Activities of Daily Living Home Assistive Devices/Equipment: Wheelchair, Environmental consultant (specify type) ADL Screening (condition at time of admission) Patient's cognitive ability adequate to safely complete daily  activities?: Yes Is the patient deaf or have difficulty hearing?: No Does the patient have difficulty seeing, even when wearing glasses/contacts?: No Does the patient have difficulty concentrating, remembering, or making decisions?: No Patient able to express need for assistance with ADLs?: Yes Does the patient have difficulty dressing or bathing?: Yes Independently performs ADLs?: No Communication: Independent Dressing (OT): Needs assistance Grooming: Needs assistance Is this a change from baseline?: Pre-admission baseline Feeding: Independent Bathing: Needs assistance Is this a change from baseline?: Pre-admission baseline Toileting: Needs assistance Is this a change from baseline?: Pre-admission baseline In/Out Bed: Needs assistance Is this a change from baseline?: Pre-admission baseline Walks in Home: Needs assistance Is this a change from baseline?: Pre-admission baseline Does the patient have difficulty walking or climbing stairs?: Yes Weakness of Legs: Both Weakness of Arms/Hands: Both  Permission Sought/Granted Permission sought to share information with : Facility Medical sales representative, Family Supports Permission granted to share information with : Yes, Verbal Permission Granted  Share Information with NAME: [name]  Permission granted to share info w AGENCY: SNF  Permission granted to share info w Relationship: son     Emotional Assessment Appearance:: Appears stated age Attitude/Demeanor/Rapport: Engaged Affect (typically observed): Calm Orientation: : Oriented to Self, Oriented to Place, Oriented to Situation, Oriented to  Time Alcohol / Substance Use: Not Applicable Psych Involvement: No (comment)  Admission diagnosis:  Weakness [R53.1] Patient Active Problem List   Diagnosis Date Noted  . Somnolence   . Normocytic anemia 02/12/2019  . UTI (urinary tract  infection) 02/11/2019  . Hypokalemia 02/11/2019  . Hyperphosphatemia 02/11/2019  . Abnormal LFTs  02/11/2019  . Leukocytosis 02/11/2019  . Hyperglycemia 02/11/2019  . Dehydration 02/10/2019  . 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 High risk medication use 02/04/2017  . Right-sided Bell's palsy 12/29/2016  . Allergic rhinitis 08/07/2015  . Hypertension 08/06/2015  . Hypertriglyceridemia 08/06/2015  . Anxiety state 07/25/2015  . Dysesthesia 10/26/2014  . Abnormality of gait 10/26/2014  . Weakness 10/26/2014  . Sinusitis, acute 07/17/2013  . Wheezing 07/17/2013  . Hypothyroidism 07/17/2013  . MS (multiple sclerosis) (HCC)   . Trigeminal neuralgia    PCP:  Salley Scarleturham, Kawanta F, MD Pharmacy:   [facility] [address] - [place], [place] [id number] [address] [address] [place] [id number] Phone: [phone] Fax: [phone]     Social Determinants of Health (SDOH) Interventions    Readmission Risk Interventions Readmission Risk Prevention Plan 02/18/2019  Transportation Screening Complete  Some recent data might be hidden

## 2019-02-20 NOTE — TOC Transition Note (Signed)
Transition of Care [facility]) - CM/SW Discharge Note   Patient Details  Name: Melanie Richardson MRN: [id number] Date of Birth: 1950/12/27  Transition of Care [facility]) CM/SW Contact:  Gustavus Bryant, LCSW Phone Number: 02/20/2019, 10:40 AM   Clinical Narrative:   Patient to transfer to [facility]. Patient can transfer after 1 pm. Family notified.   RN report number-[phone]  Final next level of care: Skilled Nursing Facility Barriers to Discharge: Continued Medical Work up   Patient Goals and CMS Choice Patient states their goals for this hospitalization and ongoing recovery are:: go home CMS Medicare.gov Compare Post Acute Care list provided to:: Patient Represenative (must comment) Choice offered to / list presented to : Adult Children  Discharge Placement              Patient [facility] Patient to be transferred to facility by: EMS Name of family member notified: [name] Patient and family notified of of transfer: 02/20/19  Discharge Plan and Services In-house Referral: NA Discharge Planning Services: NA            DME Arranged: N/A DME Agency: NA                  Social Determinants of Health (SDOH) Interventions     Readmission Risk Interventions Readmission Risk Prevention Plan 02/18/2019  Transportation Screening Complete  Some recent data might be hidden

## 2019-02-20 NOTE — Discharge Summary (Signed)
Physician Discharge Summary  Melanie Richardson BSJ:[id number] DOB: 1951-08-27 DOA: 02/10/2019  PCP: Salley Scarlet, MD  Admit date: 02/10/2019 Discharge date: 02/20/2019  Time spent: 60 minutes  Recommendations for Outpatient Follow-up:  1. Patient with discharge to skilled nursing facility at [facility].  Patient will follow-up with MD at [facility] place.  Patient will need a basic metabolic profile, CBC, magnesium level, phosphorus level done in 1 week to follow-up on electrolytes and renal function. 2. Follow-up with [name], neurology in 3 to 4 weeks.   Discharge Diagnoses:  Active Problems:   MS (multiple sclerosis) (HCC)   Trigeminal neuralgia   Hypothyroidism   Weakness   Anxiety state   Hypertension   Dehydration   UTI (urinary tract infection)   Hypokalemia   Hyperphosphatemia   Abnormal LFTs   Leukocytosis   Hyperglycemia   Normocytic anemia   Somnolence   Discharge Condition: Stable and improved  Diet recommendation: Regular  Filed Weights   02/13/19 1541  Weight: 58.4 kg    History of present illness:  Per Dr Christy Gentles D Karapetyan is a 68 y.o. female with medical history significant of multiple sclerosis came in with complaints of generalized weakness.  She had been weak over the last 1 week with increasing episodes of urinary incontinence which was unusual for her.  She denied any burning urination.  She denied any fever chest pain shortness of breath cough nausea vomiting diarrhea abdominal pain.  At baseline she walks with a walker.  However she has a wheelchair at home for longer distances.  She has aides checking on her several times during the day but she lives alone.  Her appetite decreased p.o. intake and dizziness upon standing.   ED Course: Temperature 98.2 pulse rate 104 blood pressure 131/96 saturation 99 on room air sodium 139 potassium 3.4 BUN 39 creatinine 0.94 WBC 13.5 hemoglobin 13.5 platelet count 237.  UA appears hazy with 20  ketones but negative leukocytes and nitrates rare bacteria 11-20 WBCs.  Hospital Course:  1 generalized weakness likely secondary to dehydration from poor oral intake in the setting of UTI Patient presented with generalized weakness and noted to be significantly weak during the hospitalization. Patient was changed to inpatient status given profound weakness from dehydration requiring maintenance IV fluids at 100 cc/h.  Patient pancultured with no growth to date.  Patient was on IV Rocephin and has completed 5 days of antibiotic treatment.  Patient also noted to be orthostatic.  Patient on nutritional supplementation.  Electrolytes have been repleted.  Potassium at 3.9, magnesium at 2.0, phosphorus at 3.1.  Due to patient's somnolence amitriptyline has been discontinued and sedation medications held.  Pain medication changed to as needed.  PT/OT.  Requires skilled nursing facility.  Encouraged oral intake.  Patient be discharged to skilled nursing facility.  2.  Somnolence Likely medication induced.  Patient noted over the past few days noted to be somnolent, lethargic, drowsy and difficult to arouse usually in the mornings however patient more alert in the afternoons following commands.  Patient more alert the morning of discharge.  ABG done with a pH of 7.48, PCO2 of 26, PO2 of 77, bicarb of 19 with a O2 sats of 96.2%.  Patient's amitriptyline has been discontinued and scheduled narcotic pain medication change to as needed.  TSH within normal limits.  Patient pancultured with no growth to date.  RPR nonreactive, vitamin B12 within normal limits at 849.  CT head negative for any  acute abnormalities.  Free carbamazepine level was 2.2.  Total carbamazepine level was 9.9.  [name] discussed with neurology, [name] who recommended decreasing carbamazepine dose and holding scheduled narcotics.  Patient noted to be drowsy usually in the mornings however more alert and cooperative and following commands  in the afternoons.    3.  Suspected urinary tract infection Urinalysis done on admission was concerning for UTI.  Urine cultures were never drawn on admission however obtained after patient was on antibiotics and subsequently with no growth to date.  Patient pancultured and blood cultures with no growth to date.  Patient status post 5 days IV Rocephin.  Patient remained afebrile and will be discharged in stable and improved condition  4.  History of MS/trigeminal neuralgia Amitriptyline 50 mg p.o. nightly was discontinued due to somnolence.  Carbamazepine has been changed from 400 mg 3 times daily to 200 mg 3 times daily per neuro recommendations.  Diazepam has been changed to as needed.  Will need outpatient follow-up with neurology, [name].  5.  Hypertension Blood pressure  remained controlled on her home regimen of Toprol-XL.  Patient's home regimen of HCTZ and Cozaar have been held throughout the hospitalization.  Patient's HCTZ and Cozaar will not be resumed on discharge. Outpatient follow-up.  6.  Hypothyroidism TSH at 4.762.  Free T4 0.98.    Patient maintained on home regimen Synthroid.  Outpatient follow-up.    7.  Osteopenia Patient maintained on cholecalciferol 1000 units daily.  8.  Hypokalemia/hypophosphatemia/hypomagnesemia Repleted by day of discharge.  Potassium was 3.8, magnesium at 1.9, phosphorus at 2.9.  Outpatient follow-up.  9.  Metabolic acidosis Improved.  10.  Abnormal LFTs Questionable etiology.  Right upper quadrant ultrasound with no acute sonographic abnormalities noted.  Acute hepatitis panel was negative.  LFTs trended down.  Outpatient follow-up.  11.  Normocytic anemia Likely dilutional component.  No overt bleeding.  Hemoglobin stabilized at 11.2.  Outpatient follow-up.    Procedures:  CT head 02/14/2019  Right upper quadrant ultrasound 02/11/2019  Consultations:  None  Discharge Exam: Vitals:   02/19/19 2127 02/20/19 0519  BP:  132/89 (!) 147/92  Pulse: 68 82  Resp: 16 16  Temp: 98.1 F (36.7 C) 98.2 F (36.8 C)  SpO2: 99% 99%    General: NAD Cardiovascular: RRR Respiratory: CTAB  Discharge Instructions   Discharge Instructions    Diet general   Complete by:  As directed    Increase activity slowly   Complete by:  As directed      Allergies as of 02/20/2019      Reactions   Ciprofloxacin Other (See Comments)   Dizziness and headache   Codeine Other (See Comments)   GI upset   Erythromycin Other (See Comments)   Unknown      Medication List    STOP taking these medications   amitriptyline 50 MG tablet Commonly known as:  ELAVIL   hydrochlorothiazide 25 MG tablet Commonly known as:  HYDRODIURIL   losartan 100 MG tablet Commonly known as:  COZAAR   meloxicam 7.5 MG tablet Commonly known as:  Mobic   methylPREDNISolone 4 MG Tbpk tablet Commonly known as:  MEDROL DOSEPAK   pregabalin 75 MG capsule Commonly known as:  LYRICA     TAKE these medications   albuterol 108 (90 Base) MCG/ACT inhaler Commonly known as:  VENTOLIN HFA Inhale 2 puffs into the lungs every 6 (six) hours as needed for wheezing.   aspirin 81 MG tablet  Take 81 mg by mouth daily.   carbamazepine 200 MG 12 hr tablet Commonly known as:  TEGRETOL XR Take 1 tablet (200 mg total) by mouth 3 (three) times daily. What changed:    medication strength  how much to take   cycloSPORINE 0.05 % ophthalmic emulsion Commonly known as:  RESTASIS Place 1 drop into both eyes 2 (two) times daily.   diazepam 5 MG tablet Commonly known as:  VALIUM Take 1 tablet (5 mg total) by mouth every 12 (twelve) hours as needed for anxiety.   feeding supplement (ENSURE ENLIVE) Liqd Take 237 mLs by mouth 2 (two) times daily between meals.   fluticasone 50 MCG/ACT nasal spray Commonly known as:  FLONASE SPRAY 2 SPRAYS INTO EACH NOSTRIL EVERY DAY What changed:    how much to take  how to take this  when to take  this  reasons to take this  additional instructions   HYDROcodone-acetaminophen 7.5-325 MG tablet Commonly known as:  NORCO Take 1 tablet by mouth every 8 (eight) hours as needed for moderate pain.   ibuprofen 400 MG tablet Commonly known as:  ADVIL Take 1 tablet (400 mg total) by mouth every 6 (six) hours as needed for fever, headache or mild pain.   ipratropium 0.06 % nasal spray Commonly known as:  ATROVENT Place 1 spray into the nose daily as needed for rhinitis.   levothyroxine 50 MCG tablet Commonly known as:  SYNTHROID Take 1 tablet daily before breakfast What changed:    how much to take  how to take this  when to take this  additional instructions   metoprolol succinate 25 MG 24 hr tablet Commonly known as:  TOPROL-XL TAKE 1 TABLET BY MOUTH EVERY DAY   multivitamin tablet Take 1 tablet by mouth daily.   Muscle Rub 10-15 % Crea Apply 1 application topically as needed for muscle pain.   Tecfidera 240 MG Cpdr Generic drug:  Dimethyl Fumarate TAKE ONE CAPSULE (240 MG) BY MOUTH TWICE DAILY. STORE IN ORIGINAL CONTAINER AT ROOM TEMPERATURE. What changed:    how much to take  how to take this  when to take this   VITAMIN D PO Take 1,000 Units by mouth daily.      Allergies  Allergen Reactions  . Ciprofloxacin Other (See Comments)    Dizziness and headache  . Codeine Other (See Comments)    GI upset  . Erythromycin Other (See Comments)    Unknown    Contact information for follow-up providers    Sater, Pearletha Furl, MD. Schedule an appointment as soon as possible for a visit in 4 week(s).   Specialty:  Neurology Why:  f/u in 3-4 weeks. Contact information: [address] [place] [address] [phone]            Contact information for after-discharge care    Destination    [facility] Preferred SNF .   Service:  Skilled Nursing Contact information: [address] [place] [address] [phone]                    The results of significant diagnostics from this hospitalization (including imaging, microbiology, ancillary and laboratory) are listed below for reference.    Significant Diagnostic Studies: Ct Head Wo Contrast  Result Date: 02/14/2019 CLINICAL DATA:  Multiple sclerosis, weakness, increasing urinary incontinence EXAM: CT HEAD WITHOUT CONTRAST TECHNIQUE: Contiguous axial images were obtained from the base of the skull through the vertex without intravenous contrast. COMPARISON:  01/16/2017 FINDINGS: Brain: No evidence of acute infarction, hemorrhage, hydrocephalus, extra-axial collection or mass lesion/mass effect. Extensive periventricular and deep white matter hypodensity. Vascular: No hyperdense vessel or unexpected calcification. Skull: Normal. Negative for fracture or focal lesion. Sinuses/Orbits: No acute finding. Other: None. IMPRESSION: No acute intracranial pathology. Extensive periventricular and deep white matter hypodensity, in keeping with reported history of multiple sclerosis and or small-vessel white matter disease. Electronically Signed   By: Lauralyn PrimesAlex  Bibbey M.D.   On: 02/14/2019 16:01   [place] Abdomen Limited Ruq  Result Date: 02/11/2019 CLINICAL DATA:  Abnormal LFTs x2 days. EXAM: ULTRASOUND ABDOMEN LIMITED RIGHT UPPER QUADRANT COMPARISON:  None. FINDINGS: Gallbladder: No gallstones or wall thickening visualized. No sonographic Murphy sign noted by sonographer. Common bile duct: Diameter: 0.4 cm Liver: No focal lesion identified. Within normal limits in parenchymal echogenicity. Portal vein is patent on color Doppler imaging with normal direction of blood flow towards the liver. IMPRESSION: No acute sonographic abnormality detected. No sonographic evidence for cholelithiasis. Electronically Signed   By: Katherine Mantlehristopher  Green M.D.   On: 02/11/2019 18:01    Microbiology: Recent Results (from the past 240 hour(s))  Culture, blood (routine x 2)     Status: None   Collection Time:  02/11/19  9:07 AM  Result Value Ref Range Status   Specimen Description   Final    BLOOD RIGHT ARM Performed at [facility], [address] [place], [place] [address]    Special Requests   Final    BOTTLES DRAWN AEROBIC AND ANAEROBIC Blood Culture adequate volume Performed at [facility], [address] [place], [place] [address]    Culture   Final    NO GROWTH 5 DAYS Performed at [facility], [address] [place], [place] [address]    Report Status 02/16/2019 FINAL  Final  Culture, blood (routine x 2)     Status: None   Collection Time: 02/11/19  9:07 AM  Result Value Ref Range Status   Specimen Description   Final    BLOOD RIGHT HAND Performed at [facility], [address] [place], [place] [address]    Special Requests   Final    BOTTLES DRAWN AEROBIC ONLY Blood Culture results may not be optimal due to an inadequate volume of blood received in culture bottles Performed at [facility], [address] [place], [place] [address]    Culture   Final    NO GROWTH 5 DAYS Performed at [facility], [address] [place], [place] [address]    Report Status 02/16/2019 FINAL  Final  Culture, Urine     Status: None   Collection Time: 02/12/19  6:26 AM  Result Value Ref Range Status   Specimen Description   Final    URINE, RANDOM Performed at [facility], [address] [place], [place] [address]    Special Requests   Final    NONE Performed at [facility], [address] [place], [place] [address]    Culture   Final    NO GROWTH Performed at [facility], [address] [place], [place] [address]    Report Status 02/13/2019 FINAL  Final  Novel Coronavirus, NAA (hospital order; send-out to ref lab)     Status: None   Collection Time: 02/17/19  3:04 PM  Result Value Ref Range Status   SARS-CoV-2, NAA NOT DETECTED NOT DETECTED Final  Comment: (NOTE) This test was developed and its performance characteristics determined by [employer]. This test has not been FDA cleared or approved. This test has been authorized by FDA under an Emergency Use Authorization (EUA). This test is only authorized for the duration of time the declaration that circumstances exist justifying the authorization of the emergency use of in vitro diagnostic tests for detection of SARS-CoV-2 virus and/or diagnosis of COVID-19 infection under section 564(b)(1) of the Act, 21 U.S.C. 161WRU-0(A)(5), unless the authorization is terminated or revoked sooner. When diagnostic testing is negative, the possibility of a false negative result should be considered in the context of a patient's recent exposures and the presence of clinical signs and symptoms consistent with COVID-19. An individual without symptoms of COVID-19 and who is not shedding SARS-CoV-2 virus would expect to have a negative (not detected) result in this assay. Performed  At: [facility] [address] [place], [place] [address] Jolene Schimke MD NW:[id number]    Coronavirus Source NASOPHARYNGEAL  Final    Comment: Performed at [facility], [address], [place], [place] [address]     Labs: Basic Metabolic Panel: Recent Labs  Lab 02/14/19 0526 02/15/19 0518 02/16/19 0434 02/17/19 0306  NA 138 135 137 136  K 3.0* 4.0 3.9 3.8  CL 104 104 105 104  CO2 20* 20* 21* 21*  GLUCOSE 77 84 99 97  BUN CREATININE 0.62 0.52 0.60 0.68  CALCIUM 8.3* 8.3* 8.9 8.9  MG 1.6* 1.6* 2.0 1.9  PHOS 2.5 2.2* 3.1 2.9   Liver Function Tests: Recent Labs  Lab 02/14/19 0526 02/15/19 0518 02/16/19 0434  AST 33 30 25  ALT 43 38 35  ALKPHOS 55 52 51  BILITOT 0.8 0.9 0.5  PROT 6.6 6.5 6.9  ALBUMIN 3.3* 3.3* 3.2*   No results for input(s): LIPASE, AMYLASE in the last 168 hours. Recent Labs  Lab 02/14/19 1505  AMMONIA 24   CBC: Recent Labs   Lab 02/14/19 0526 02/15/19 0518 02/16/19 0434 02/17/19 0306  WBC 8.7 9.3 10.0 9.6  NEUTROABS 5.7 5.9 6.5 5.9  HGB 11.2* 11.0* 11.8* 11.2*  HCT 32.4* 32.7* 33.9* 33.0*  MCV 92.8 92.6 92.1 93.2  PLT 216 225 255 265   Cardiac Enzymes: No results for input(s): CKTOTAL, CKMB, CKMBINDEX, TROPONINI in the last 168 hours. BNP: BNP (last 3 results) No results for input(s): BNP in the last 8760 hours.  ProBNP (last 3 results) No results for input(s): PROBNP in the last 8760 hours.  CBG: No results for input(s): GLUCAP in the last 168 hours.     Signed:  Ramiro Harvest MD.  [facility] 02/20/2019, 11:47 AM

## 2019-02-20 NOTE — Progress Notes (Signed)
Called two times to give report to the nurse at [facility] nursing facility but no  one picked up the phone .

## 2019-02-20 NOTE — Progress Notes (Signed)
Patient was to be discharged to skilled nursing facility, yesterday. Patient currently stable.  Vital signs stable.  Patient asymptomatic.  Patient requesting to speak to son prior to discharge to skilled nursing facility. Patient's exam unremarkable and unchanged from yesterday. Patient to be discharged to skilled nursing facility today. No changes to discharge summary. Patient will be discharged in stable condition.  No charge.

## 2019-02-21 DIAGNOSIS — G500 Trigeminal neuralgia: Secondary | ICD-10-CM | POA: Diagnosis not present

## 2019-02-21 DIAGNOSIS — E860 Dehydration: Secondary | ICD-10-CM | POA: Diagnosis not present

## 2019-02-21 DIAGNOSIS — I10 Essential (primary) hypertension: Secondary | ICD-10-CM | POA: Diagnosis not present

## 2019-02-21 DIAGNOSIS — M8580 Other specified disorders of bone density and structure, unspecified site: Secondary | ICD-10-CM | POA: Diagnosis not present

## 2019-02-21 DIAGNOSIS — G35 Multiple sclerosis: Secondary | ICD-10-CM | POA: Diagnosis not present

## 2019-02-21 DIAGNOSIS — F99 Mental disorder, not otherwise specified: Secondary | ICD-10-CM | POA: Diagnosis not present

## 2019-02-21 DIAGNOSIS — E038 Other specified hypothyroidism: Secondary | ICD-10-CM | POA: Diagnosis not present

## 2019-02-22 DIAGNOSIS — I10 Essential (primary) hypertension: Secondary | ICD-10-CM | POA: Diagnosis not present

## 2019-02-22 DIAGNOSIS — G35 Multiple sclerosis: Secondary | ICD-10-CM | POA: Diagnosis not present

## 2019-02-22 DIAGNOSIS — F99 Mental disorder, not otherwise specified: Secondary | ICD-10-CM | POA: Diagnosis not present

## 2019-02-22 DIAGNOSIS — M8580 Other specified disorders of bone density and structure, unspecified site: Secondary | ICD-10-CM | POA: Diagnosis not present

## 2019-02-22 DIAGNOSIS — E872 Acidosis: Secondary | ICD-10-CM | POA: Diagnosis not present

## 2019-02-22 DIAGNOSIS — F17200 Nicotine dependence, unspecified, uncomplicated: Secondary | ICD-10-CM | POA: Diagnosis not present

## 2019-02-22 DIAGNOSIS — R400 Somnolence: Secondary | ICD-10-CM | POA: Diagnosis not present

## 2019-02-22 DIAGNOSIS — E038 Other specified hypothyroidism: Secondary | ICD-10-CM | POA: Diagnosis not present

## 2019-02-22 DIAGNOSIS — G500 Trigeminal neuralgia: Secondary | ICD-10-CM | POA: Diagnosis not present

## 2019-02-24 ENCOUNTER — Telehealth: Payer: Self-pay | Admitting: *Deleted

## 2019-02-24 ENCOUNTER — Encounter: Payer: Self-pay | Admitting: *Deleted

## 2019-02-24 NOTE — Telephone Encounter (Signed)
Placed letter in mail for pt since unable to reach by phone.

## 2019-02-24 NOTE — Telephone Encounter (Signed)
I tried calling pt at [phone]. Went to VM. I left message for pt to call office. Tried [phone] but number disconnected

## 2019-02-24 NOTE — Telephone Encounter (Signed)
Took call from [place] in phone room. Spoke with [facility]. They have tried reaching pt and unable. [phone] number they have left messages on but do not hear back and [phone] disconnected. They did stated son called on 02/17/19 stating they were trying to put her in a LTC facility. She will send a letter to pt. We do not have DPR on file to be able to speak with son. Advised I will try to reach out and will make Dr. Epimenio Foot aware.

## 2019-03-01 DIAGNOSIS — F419 Anxiety disorder, unspecified: Secondary | ICD-10-CM | POA: Diagnosis not present

## 2019-03-01 DIAGNOSIS — G500 Trigeminal neuralgia: Secondary | ICD-10-CM | POA: Diagnosis not present

## 2019-03-01 DIAGNOSIS — G35 Multiple sclerosis: Secondary | ICD-10-CM | POA: Diagnosis not present

## 2019-03-15 ENCOUNTER — Telehealth: Payer: Self-pay | Admitting: *Deleted

## 2019-03-15 DIAGNOSIS — G500 Trigeminal neuralgia: Secondary | ICD-10-CM | POA: Diagnosis not present

## 2019-03-15 DIAGNOSIS — R21 Rash and other nonspecific skin eruption: Secondary | ICD-10-CM | POA: Diagnosis not present

## 2019-03-15 NOTE — Telephone Encounter (Signed)
Tried calling pt at [phone]. LVM for her to call office today about her appt tomorrow.  Called [phone]. Number changed and new number unknown per automated message.  I reviewed pt chart. Appears she may be at [facility] after hospital discharge. [address] [place] [address] Phone: [phone].   I called and verified she is still at facility. Transferred me to [facility]. I had to LVM for her to call me back today about pt appt tomorrow and setting up virtual visit.

## 2019-03-15 NOTE — Telephone Encounter (Signed)
[facility] again. Transferred to nurses station but phone rang, unable to reach nurse. Called again and spoke with Opal Sidles. She provided me Jessica's phone number to call: [phone]. I called and spoke with Janett Billow. She was able to set up virtual visit. Emailed her link at [email or web address] [employer]-coordinatory at Janett Billow) Verified she received link. Explained doxy.me steps. She will fax copy of medication list in the morning. Asked her fax to [phone].

## 2019-03-16 ENCOUNTER — Other Ambulatory Visit: Payer: Self-pay

## 2019-03-16 ENCOUNTER — Ambulatory Visit: Payer: Medicare Other | Admitting: Neurology

## 2019-03-16 NOTE — Telephone Encounter (Signed)
Per Dr. Nathaneil Canary, pt never logged on for virtual visit. I called [facility] to speak with Janett Billow. Transferred to [place] line but unable to reach. I  called and spoke with Janett Billow on personal phone. She apologized and stated she passed along message for someone to take care of virtual visit, she had personal emergency this am. I r/s appt for 03/22/19 at 930am (ok per Dr. Felecia Shelling). I re-sent link as requested to [place]. She will f/u and have someone fax med list. We did not receive this.

## 2019-03-22 ENCOUNTER — Ambulatory Visit (INDEPENDENT_AMBULATORY_CARE_PROVIDER_SITE_OTHER): Payer: Medicare Other | Admitting: Neurology

## 2019-03-22 ENCOUNTER — Encounter: Payer: Self-pay | Admitting: Neurology

## 2019-03-22 ENCOUNTER — Other Ambulatory Visit: Payer: Self-pay

## 2019-03-22 DIAGNOSIS — Z79899 Other long term (current) drug therapy: Secondary | ICD-10-CM | POA: Diagnosis not present

## 2019-03-22 DIAGNOSIS — G35 Multiple sclerosis: Secondary | ICD-10-CM

## 2019-03-22 DIAGNOSIS — R269 Unspecified abnormalities of gait and mobility: Secondary | ICD-10-CM

## 2019-03-22 DIAGNOSIS — G500 Trigeminal neuralgia: Secondary | ICD-10-CM | POA: Diagnosis not present

## 2019-03-22 DIAGNOSIS — F419 Anxiety disorder, unspecified: Secondary | ICD-10-CM | POA: Diagnosis not present

## 2019-03-22 NOTE — Progress Notes (Signed)
[facility]  PATIENT: Melanie Richardson DOB: [birth year]  REFERRING CLINICIAN: Kingsley Spittle Snover HISTORY FROM: Patient MS   REASON FOR VISIT: MS and trigeminal Neuralgia   HISTORICAL  CHIEF COMPLAINT:  Chief Complaint  Patient presents with   Multiple Sclerosis    HISTORY OF PRESENT ILLNESS:  Melanie Richardson is a 68 year old woman with multiple sclerosis.  Update 03/22/2019: Virtual Visit via Video Note I connected with Melanie Richardson on 03/22/19 at  9:30 AM EDT by a video enabled telemedicine application and verified that I am speaking with the correct person.  I discussed the limitations of evaluation and management by telemedicine and the availability of in person appointments. The patient expressed understanding and agreed to proceed.  History of Present Illness: She is on Tecfidera and tolerates it well.  Lymphocyte counts were in the normal range last month she has not had any exacerbations.  She is noting more trouble with her gait since her hospitalization.  She is in a skilled nursing facility.  Two months ago she had a bad UTI and was hospitalized x 1 week prompting discharge to a nursing facility.   She is doing PT.  She is using a walker to go about feet.  She feels off balanced.   She has leg weakness  Her trigeminal neuralgia has flared up.  Of note, when she was hospitalized, the Lyrica and amitriptyline was discontinued.  She is sleeping well.  She has some depression.  Cognition is doing well.      Observations/Objective: She is a well-developed well-nourished woman in no acute distress.  The head is normocephalic and atraumatic.  Sclera are anicteric.  Visible skin appears normal.  The neck has a good range of motion.  Pharynx and tongue have normal appearance.  She is alert and fully oriented with fluent speech and good attention, knowledge and memory.  Extraocular muscles are intact.  Facial strength is normal.  Palatal elevation and tongue  protrusion are midline.  She appears to have normal strength in the arms.  Rapid alternating movements and finger-nose-finger are performed well.  Assessment and Plan: 1. MS (multiple sclerosis) (HCC)   2. Trigeminal neuralgia   3. High risk medication use   4. Abnormality of gait     1.  Continue Tecfidera. 2.  We will continue to treat the trigeminal neuralgia with just the Tegretol.  However, if it worsens consider adding back the pregabalin.  3.   She is continuing to get some physical therapy.  Should try to be as active as possible. 4.  Follow-up in 6 months or sooner if there are new or worsening neurologic symptoms.  Follow Up Instructions: I discussed the assessment and treatment plan with the patient. The patient was provided an opportunity to ask questions and all were answered. The patient agreed with the plan and demonstrated an understanding of the instructions.    The patient was advised to call back or seek an in-person evaluation if the symptoms worsen or if the condition fails to improve as anticipated.  I provided 25 minutes of non-face-to-face time during this encounter.  ____________________________________________ From previous visits: Update 08/05/2018: She feels her MS is stable over the last 6 months.   She is on Tecfidera and she tolerates it well.   No recent definite exacerbation.    She uses a walker mostly but will use a wheelchair for longer substances.    No falls.    Leg strength is ok and balance  is more a problem than strength.   She has some LBP, especially in rainy weather.  She has just had one episode of trigeminal neuralgia and was better after an hydrocodone.   She has no significant numbness or tingling.   She feels her vision is unchanged.   Her bladder is doing ok (mild stable urgency).      She notes fatigue is mild and she sleeps well.    Mood is doing well.  Cognition is stable.   AS her calcium was increased in the past and vit D was normal she is  not on vit D supplements  Update 02/02/2018: She continues on Tecfidera and generally tolerates it well.  She has not had any exacerbations since starting that medication.  She notes no new numbness or weakness and feels her gait is stable.  She has no recent falls.  She has had difficulties with balance more than strength and uses a walker.  She is hoping to get back to a cane.   The trigeminal neuralgia is doing very well with only one recent cluster earlier this year.   She is on lamictal and tegretol and amitriptyline.    She notes no new MS difficulty with vision.   She will be getting cataracts removed soon.   She has mild urinary frequency and urgency not bad enough to treat.  She notes some fatigue.  Most nights she sleeps well.  She denies any significant problem with depression and has mild anxiety. She has a recent divorce.     She notes no difficulties with her cognition.     She has some LBP, mostly in the left LB and buttock.   Patches help.   She also notes some arthritis in her legs.        Update 08/04/2017:   She switched to Tecfidera May 2018 due to new activity on the MRI and an exacerbation earlier this year. She is tolerating it well.   She denies any new exacerbations.   She has not had any GI issues or flushing.     She notes no new numbness, weakness or gait issues.   No recent falls.   Balance is poor  (old) and she uses a walker.   Her trigeminal neuralgia has not acted up at all this year.   Her vision worsened when she had Bell's palsy and she feels they are doing better again.   Bladder function is doing well.   No significant urgency.     Fatigue has been better since the last visit.      She sleeps well at night.   She feels mood is doing ok and she denies much depression or anxiety.    She notes no significant problems with cognition.  Allergies are bothering her more and she has a lot of nasal drainage.   She gets lower back pain helped by lidocaine patches (OTC).     Sometimes she also has proximal leg pain.  _______________________________________ From 02/04/2017 MS:    Her MRI a couple weeks ago showed a few lesions not present in 2016 implying continued inflammation .   We discussed considering a different medication   Vision:   She notes some blurry vision on the left. She also feels the left eye is dry (facial weakness and reduced blinks are on the right)  About 7 weeks ago, she had what she feels was a sinus infection and felt off balanced.   She fell in  a parking lot, hitting her head but did not lose consciousness.   About 2 weeks ago, her son noted the right face was drooping and she went to [facility].  She did not note headache at the onset.   She noted mild lip numbness but the weakness was the main symptom   She notes water dribbling out of her mouth when she drank.    She noted mild change in vision last week but back to baseline.      No change in leg or arm numbness or weakness.  No change in gait or bladder.     In the ED, she was given Valtrex and prednisone.  She had a CT of the head.   It shows White matter changes c/w her MS and a scalp hematoma on her left.    MS:   She is on Rebif and tolerates it well.    Before current symptoms, she had no recent exacerbation.  Trigeminal neuralgia:    TN pain has done fairly well lately . She has had only occasional spasms of pain on the right side of her face..   She has received the most benefit from combination of the antiepileptics (Tegretol, Pregabalin and Lamotrigine) and Amitriptyline.   She is not needing as much hydrocodone for pain. Stress often triggers a spell.    In the past, her spells were so frequent and severe that activity like eating would trigger more severe pain.   In the past, she has had a gamma knife procedure (2005) and radiofrequency ablations 2. These procedures did not help her much at the time. Those procedures left her with reduced sensation on the  right.  Gait/strength/sensation:   She feels off balanced and had the one fall.  Gait is worse the past 1/2 year in general, especially the past 1-2 months. .    She now must use a cane.  She has no major weakness, but her right leg seems weaker when she is hot.   She has no numbness in arms or legs.    Bladder:   She denies any bladder issues.      Vision/hearing:   She feels vision is about the same but notes some blurry vision last week, .   She feels the right hearing is not as good as the left.      Fatigue/sleep:   Fatigue is mild most days.  She continues to have some insomnia, worse with sleep maintenance. Ambien and amitriptyline help her insomnia.   Mood/cognition:   She notes depression and mild anxiety and has had more stress the past year.     LBP/leg pain:  She has low back pain and right leg radiculopathy pain at times. This is helped a lot by Lyrica. She notes she limps some if temperatures are warmer.    MS History:   She had the onset of right sided facial numbness now pregnant during the 1970s. That numbness resolved over the next month or so. However, in the mid 1990s she began to experience pain in the same distribution. She had an MRI of the brain performed by Dr. Buzzy HanStiefel in 1998. It was consistent with multiple sclerosis.  She had an LP in the 1970's and the CSF was not significantly abnormal to diagnose MS.   She has been on Rebif since 2004.  She had an exacerbation of March 2018 with worsening gait, left visual changes. She also had facial weakness that could be  a Bell's palsy.   She switch to Tecfidera May 2018 due to the exacerbation and new activity on the MRI.  REVIEW OF SYSTEMS:  Constitutional: No fevers, chills, sweats, or change in appetite.   Some fatigue and insomnia Eyes: No visual changes, double vision, eye pain Ear, nose and throat: No hearing loss, ear pain, nasal congestion, sore throat Cardiovascular: No chest pain, palpitations Respiratory:  No  shortness of breath at rest or with exertion.   No wheezes GastrointestinaI: No nausea, vomiting, diarrhea, abdominal pain, fecal incontinence Genitourinary:  No dysuria, urinary retention or frequency.  No nocturia. Musculoskeletal:  No neck pain, back pain Integumentary: No rash, pruritus, skin lesions Neurological: as above Psychiatric: No depression at this time.  No anxiety Endocrine: No palpitations, diaphoresis, change in appetite, change in weigh or increased thirst Hematologic/Lymphatic:  No anemia, purpura, petechiae. Allergic/Immunologic: No itchy/runny eyes, nasal congestion, recent allergic reactions, rashes  ALLERGIES: Allergies  Allergen Reactions   Ciprofloxacin Other (See Comments)    Dizziness and headache   Codeine Other (See Comments)    GI upset   Erythromycin Other (See Comments)    Unknown    HOME MEDICATIONS: Outpatient Medications Prior to Visit  Medication Sig Dispense Refill   albuterol (PROVENTIL HFA;VENTOLIN HFA) 108 (90 BASE) MCG/ACT inhaler Inhale 2 puffs into the lungs every 6 (six) hours as needed for wheezing. 1 Inhaler 6   aspirin 81 MG tablet Take 81 mg by mouth daily.     carbamazepine (TEGRETOL XR) 200 MG 12 hr tablet Take 1 tablet (200 mg total) by mouth 3 (three) times daily. 90 tablet 0   Cholecalciferol (VITAMIN D PO) Take 1,000 Units by mouth daily.      cycloSPORINE (RESTASIS) 0.05 % ophthalmic emulsion Place 1 drop into both eyes 2 (two) times daily.     diazepam (VALIUM) 5 MG tablet Take 1 tablet (5 mg total) by mouth every 12 (twelve) hours as needed for anxiety. 10 tablet 0   feeding supplement, ENSURE ENLIVE, (ENSURE ENLIVE) LIQD Take 237 mLs by mouth 2 (two) times daily between meals. 237 mL 12   fluticasone (FLONASE) 50 MCG/ACT nasal spray SPRAY 2 SPRAYS INTO EACH NOSTRIL EVERY DAY (Patient taking differently: Place 2 sprays into both nostrils daily as needed for allergies. ) 16 g 3   HYDROcodone-acetaminophen (NORCO)  7.5-325 MG tablet Take 1 tablet by mouth every 8 (eight) hours as needed for moderate pain. 15 tablet 0   ibuprofen (ADVIL) 400 MG tablet Take 1 tablet (400 mg total) by mouth every 6 (six) hours as needed for fever, headache or mild pain. 30 tablet 0   ipratropium (ATROVENT) 0.06 % nasal spray Place 1 spray into the nose daily as needed for rhinitis.      levothyroxine (SYNTHROID, LEVOTHROID) 50 MCG tablet Take 1 tablet daily before breakfast (Patient taking differently: Take 50 mcg by mouth daily. ) 90 tablet 1   Menthol-Methyl Salicylate (MUSCLE RUB) 10-15 % CREA Apply 1 application topically as needed for muscle pain.  0   metoprolol succinate (TOPROL-XL) 25 MG 24 hr tablet TAKE 1 TABLET BY MOUTH EVERY DAY (Patient taking differently: Take 25 mg by mouth daily. ) 90 tablet 3   Multiple Vitamin (MULTIVITAMIN) tablet Take 1 tablet by mouth daily.     TECFIDERA 240 MG CPDR TAKE ONE CAPSULE (240 MG) BY MOUTH TWICE DAILY. STORE IN ORIGINAL CONTAINER AT ROOM TEMPERATURE. (Patient taking differently: Take 240 mg by mouth 2 (two) times a day. TAKE  ONE CAPSULE (240 MG) BY MOUTH TWICE DAILY. STORE IN ORIGINAL CONTAINER AT ROOM TEMPERATURE.) 180 capsule 3   No facility-administered medications prior to visit.     PAST MEDICAL HISTORY: Past Medical History:  Diagnosis Date   Hypertension    MS (multiple sclerosis) (HCC)    Osteopenia 05/2014   T score -1.8 FRAX 16%/4.7%   Thyroid disease    Hypothyroid   Trigeminal neuralgia     PAST SURGICAL HISTORY: Past Surgical History:  Procedure Laterality Date   BACK SURGERY  2011   ELBOW SURGERY  1999   shattered elbow   feet surgery  1990   gamma knife  2005   radiofrquency rhizotomy  2002,2003   underarm surgery  1988    FAMILY HISTORY: Family History  Problem Relation Age of Onset   Heart disease Mother    Hypertension Father    Cancer Father        colon   Stroke Father    Breast cancer Maternal Aunt 2550   Breast  cancer Cousin 40    SOCIAL HISTORY:  Social History   Socioeconomic History   Marital status: Married    Spouse name: Not on file   Number of children: 2   Years of education: college   Highest education level: Not on file  Occupational History    Employer: OTHER  Social Needs   Financial resource strain: Not on file   Food insecurity    Worry: Not on file    Inability: Not on file   Transportation needs    Medical: Not on file    Non-medical: Not on file  Tobacco Use   Smoking status: Current Every Day Smoker    Packs/day: 1.00    Types: Cigarettes   Smokeless tobacco: Never Used  Substance and Sexual Activity   Alcohol use: Yes    Alcohol/week: 0.0 standard drinks    Comment: rarely   Drug use: No   Sexual activity: Yes    Birth control/protection: Post-menopausal    Comment: 1st intercourse 68 yo-Fewer than 5 partners  Lifestyle   Physical activity    Days per week: Not on file    Minutes per session: Not on file   Stress: Not on file  Relationships   Social connections    Talks on phone: Not on file    Gets together: Not on file    Attends religious service: Not on file    Active member of club or organization: Not on file    Attends meetings of clubs or organizations: Not on file    Relationship status: Not on file   Intimate partner violence    Fear of current or ex partner: Not on file    Emotionally abused: Not on file    Physically abused: Not on file    Forced sexual activity: Not on file  Other Topics Concern   Not on file  Social History Narrative   Patient lives at home alone, has 2 children   Patient is right handed   Educational level is some college   Caffeine consumption is 3 cups a day     PHYSICAL EXAM  There were no vitals filed for this visit.  There is no height or weight on file to calculate BMI.   General: The patient is well-developed and well-nourished and in no acute distress    Neurologic  Exam  Mental status: The patient is alert and oriented x 3 at the time  of the examination. The patient has apparent normal recent and remote memory, with an apparently normal attention span and concentration ability.   Speech is normal.  Cranial nerves: Extraocular movements are full.  Facial strength and sensation is normal.  Trapezius strength is strong.  Motor:   Muscle bulk and tone are normal. Strength is  5 / 5 in arms and 4+/5 in legs.   .   Sensory: She had intact sensation to touch and vibration in the arms and legs.  Coordination: Cerebellar testing shows good finger-nose-finger but mildly reduced heel-to-shin bilaterally.  Gait and station: Station is normal and gait has reduced stride and is wide.   She can take steps with unilateral support but walks much better with walker .   She is unable to tandem walk.  The Romberg is positive.  Reflexes: Deep tendon reflexes are symmetric and normal in the arms and ankles and increased at the knees.Marland Kitchen.     DIAGNOSTIC DATA (LABS, IMAGING, TESTING) - I reviewed patient records, labs, notes, testing and imaging myself where available.  Lab Results  Component Value Date   WBC 9.6 02/17/2019   HGB 11.2 (L) 02/17/2019   HCT 33.0 (L) 02/17/2019   MCV 93.2 02/17/2019   PLT 265 02/17/2019      Component Value Date/Time   NA 136 02/17/2019 0306   NA 137 08/04/2017 1359   K 3.8 02/17/2019 0306   CL 104 02/17/2019 0306   CO2 21 (L) 02/17/2019 0306   GLUCOSE 97 02/17/2019 0306   BUN 10 02/17/2019 0306   BUN 13 08/04/2017 1359   CREATININE 0.68 02/17/2019 0306   CREATININE 0.69 05/17/2018 1125   CALCIUM 8.9 02/17/2019 0306   PROT 6.9 02/16/2019 0434   PROT 7.6 08/05/2018 1327   ALBUMIN 3.2 (L) 02/16/2019 0434   ALBUMIN 4.6 08/05/2018 1327   AST 25 02/16/2019 0434   ALT 35 02/16/2019 0434   ALKPHOS 51 02/16/2019 0434   BILITOT 0.5 02/16/2019 0434   BILITOT 0.3 08/05/2018 1327   GFRNONAA >60 02/17/2019 0306   GFRNONAA 77  01/22/2017 1249   GFRAA >60 02/17/2019 0306   GFRAA 89 01/22/2017 1249     Everson Mott A. Epimenio FootSater, MD, PhD 03/22/2019, 8:41 PM Certified in Neurology, Clinical Neurophysiology, Sleep Medicine, Pain Medicine and Neuroimaging  [facility] [address], [address] [place], [place] [address] [phone]

## 2019-03-24 DIAGNOSIS — E039 Hypothyroidism, unspecified: Secondary | ICD-10-CM | POA: Diagnosis not present

## 2019-03-24 DIAGNOSIS — I10 Essential (primary) hypertension: Secondary | ICD-10-CM | POA: Diagnosis not present

## 2019-03-24 DIAGNOSIS — G35 Multiple sclerosis: Secondary | ICD-10-CM | POA: Diagnosis not present

## 2019-03-24 DIAGNOSIS — G500 Trigeminal neuralgia: Secondary | ICD-10-CM | POA: Diagnosis not present

## 2019-03-29 DIAGNOSIS — G500 Trigeminal neuralgia: Secondary | ICD-10-CM | POA: Diagnosis not present

## 2019-03-30 ENCOUNTER — Other Ambulatory Visit: Payer: Self-pay | Admitting: *Deleted

## 2019-03-30 NOTE — Patient Outreach (Signed)
Member assessed for potential [facility] Care Management needs as a benefit of her [place] Medicare.  Per PatientPing member is at [facility].   Will plan to sign off if member stays under private pay or stays long term.   Will continue to collaborate with [facility] RN and facility to confirm disposition plans and potential [facility] Care Management needs.  Marthenia Rolling, MSN-Ed, RN,BSN [place] Acute Care Coordinator [phone] [facility]) [phone]  (Toll free office)

## 2019-04-06 ENCOUNTER — Other Ambulatory Visit: Payer: Self-pay | Admitting: *Deleted

## 2019-04-06 NOTE — Patient Outreach (Signed)
Member assessed for potential [facility] Care Management needs as a benefit of [place] Medicare.  Confirmed in Patient Ping and with [facility] RN that member is under private pay at [facility].   Writer to sign off. No identifiable [facility] Care Management needs at this time.  Marthenia Rolling, MSN-Ed, RN,BSN [place] Acute Care Coordinator [phone] [facility]) [phone]  (Toll free office)

## 2019-04-11 DIAGNOSIS — R21 Rash and other nonspecific skin eruption: Secondary | ICD-10-CM | POA: Diagnosis not present

## 2019-04-11 DIAGNOSIS — L03818 Cellulitis of other sites: Secondary | ICD-10-CM | POA: Diagnosis not present

## 2019-04-12 DIAGNOSIS — I10 Essential (primary) hypertension: Secondary | ICD-10-CM | POA: Diagnosis not present

## 2019-04-12 DIAGNOSIS — G500 Trigeminal neuralgia: Secondary | ICD-10-CM | POA: Diagnosis not present

## 2019-04-12 DIAGNOSIS — M8580 Other specified disorders of bone density and structure, unspecified site: Secondary | ICD-10-CM | POA: Diagnosis not present

## 2019-04-12 DIAGNOSIS — E038 Other specified hypothyroidism: Secondary | ICD-10-CM | POA: Diagnosis not present

## 2019-04-12 DIAGNOSIS — R21 Rash and other nonspecific skin eruption: Secondary | ICD-10-CM | POA: Diagnosis not present

## 2019-04-12 DIAGNOSIS — F99 Mental disorder, not otherwise specified: Secondary | ICD-10-CM | POA: Diagnosis not present

## 2019-04-12 DIAGNOSIS — J3089 Other allergic rhinitis: Secondary | ICD-10-CM | POA: Diagnosis not present

## 2019-04-12 DIAGNOSIS — G35 Multiple sclerosis: Secondary | ICD-10-CM | POA: Diagnosis not present

## 2019-04-18 DIAGNOSIS — G500 Trigeminal neuralgia: Secondary | ICD-10-CM | POA: Diagnosis not present

## 2019-04-18 DIAGNOSIS — G35 Multiple sclerosis: Secondary | ICD-10-CM | POA: Diagnosis not present

## 2019-04-18 DIAGNOSIS — F419 Anxiety disorder, unspecified: Secondary | ICD-10-CM | POA: Diagnosis not present

## 2019-05-06 DIAGNOSIS — B349 Viral infection, unspecified: Secondary | ICD-10-CM | POA: Diagnosis not present

## 2019-05-06 DIAGNOSIS — B340 Adenovirus infection, unspecified: Secondary | ICD-10-CM | POA: Diagnosis not present

## 2019-05-09 ENCOUNTER — Other Ambulatory Visit: Payer: Self-pay

## 2019-05-19 ENCOUNTER — Telehealth: Payer: Self-pay | Admitting: Neurology

## 2019-05-19 DIAGNOSIS — L281 Prurigo nodularis: Secondary | ICD-10-CM | POA: Diagnosis not present

## 2019-05-19 DIAGNOSIS — I10 Essential (primary) hypertension: Secondary | ICD-10-CM | POA: Diagnosis not present

## 2019-05-19 DIAGNOSIS — E038 Other specified hypothyroidism: Secondary | ICD-10-CM | POA: Diagnosis not present

## 2019-05-19 DIAGNOSIS — G500 Trigeminal neuralgia: Secondary | ICD-10-CM | POA: Diagnosis not present

## 2019-05-19 DIAGNOSIS — E860 Dehydration: Secondary | ICD-10-CM | POA: Diagnosis not present

## 2019-05-19 DIAGNOSIS — F99 Mental disorder, not otherwise specified: Secondary | ICD-10-CM | POA: Diagnosis not present

## 2019-05-19 DIAGNOSIS — G35 Multiple sclerosis: Secondary | ICD-10-CM | POA: Diagnosis not present

## 2019-05-19 NOTE — Telephone Encounter (Signed)
05/19/19 LVM to schedule 6 mo f/u from June 2020

## 2019-05-23 DIAGNOSIS — Z79899 Other long term (current) drug therapy: Secondary | ICD-10-CM | POA: Diagnosis not present

## 2019-05-24 DIAGNOSIS — E876 Hypokalemia: Secondary | ICD-10-CM | POA: Diagnosis not present

## 2019-05-24 DIAGNOSIS — G35 Multiple sclerosis: Secondary | ICD-10-CM | POA: Diagnosis not present

## 2019-05-24 DIAGNOSIS — D6489 Other specified anemias: Secondary | ICD-10-CM | POA: Diagnosis not present

## 2019-05-24 DIAGNOSIS — D72828 Other elevated white blood cell count: Secondary | ICD-10-CM | POA: Diagnosis not present

## 2019-05-25 DIAGNOSIS — Z79899 Other long term (current) drug therapy: Secondary | ICD-10-CM | POA: Diagnosis not present

## 2019-05-30 DIAGNOSIS — G35 Multiple sclerosis: Secondary | ICD-10-CM | POA: Diagnosis not present

## 2019-05-30 DIAGNOSIS — G500 Trigeminal neuralgia: Secondary | ICD-10-CM | POA: Diagnosis not present

## 2019-05-30 DIAGNOSIS — F419 Anxiety disorder, unspecified: Secondary | ICD-10-CM | POA: Diagnosis not present

## 2019-06-06 DIAGNOSIS — G35 Multiple sclerosis: Secondary | ICD-10-CM | POA: Diagnosis not present

## 2019-06-06 DIAGNOSIS — F419 Anxiety disorder, unspecified: Secondary | ICD-10-CM | POA: Diagnosis not present

## 2019-06-06 DIAGNOSIS — G500 Trigeminal neuralgia: Secondary | ICD-10-CM | POA: Diagnosis not present

## 2019-06-06 DIAGNOSIS — R630 Anorexia: Secondary | ICD-10-CM | POA: Diagnosis not present

## 2019-06-20 DIAGNOSIS — G35 Multiple sclerosis: Secondary | ICD-10-CM | POA: Diagnosis not present

## 2019-06-20 DIAGNOSIS — R630 Anorexia: Secondary | ICD-10-CM | POA: Diagnosis not present

## 2019-06-20 DIAGNOSIS — F419 Anxiety disorder, unspecified: Secondary | ICD-10-CM | POA: Diagnosis not present

## 2019-06-20 DIAGNOSIS — G500 Trigeminal neuralgia: Secondary | ICD-10-CM | POA: Diagnosis not present

## 2019-06-21 DIAGNOSIS — G35 Multiple sclerosis: Secondary | ICD-10-CM | POA: Diagnosis not present

## 2019-06-21 DIAGNOSIS — R627 Adult failure to thrive: Secondary | ICD-10-CM | POA: Diagnosis not present

## 2019-06-21 DIAGNOSIS — I10 Essential (primary) hypertension: Secondary | ICD-10-CM | POA: Diagnosis not present

## 2019-06-21 DIAGNOSIS — Z7689 Persons encountering health services in other specified circumstances: Secondary | ICD-10-CM | POA: Diagnosis not present

## 2019-06-21 DIAGNOSIS — E038 Other specified hypothyroidism: Secondary | ICD-10-CM | POA: Diagnosis not present

## 2019-06-21 DIAGNOSIS — F3489 Other specified persistent mood disorders: Secondary | ICD-10-CM | POA: Diagnosis not present

## 2019-06-22 ENCOUNTER — Telehealth: Payer: Self-pay | Admitting: *Deleted

## 2019-06-22 NOTE — Telephone Encounter (Signed)
I called patient and LVM regarding scheduling a follow-up with Dr. Felecia Shelling from her virtual visit in June. Patient needs to have follow-up in December. I requested she call back to schedule.

## 2019-06-22 NOTE — Telephone Encounter (Signed)
F/u needed

## 2019-06-27 DIAGNOSIS — B349 Viral infection, unspecified: Secondary | ICD-10-CM | POA: Diagnosis not present

## 2019-06-27 DIAGNOSIS — Z20828 Contact with and (suspected) exposure to other viral communicable diseases: Secondary | ICD-10-CM | POA: Diagnosis not present

## 2019-06-28 NOTE — Telephone Encounter (Signed)
Pt's son called back and stated that the pt is in a facility now and is actually going to be transferred to another one this Thursday so once she is settled they will call with facility information so that the pt can do a doxy visit. Please advise.

## 2019-06-29 DIAGNOSIS — F99 Mental disorder, not otherwise specified: Secondary | ICD-10-CM | POA: Diagnosis not present

## 2019-06-29 DIAGNOSIS — I10 Essential (primary) hypertension: Secondary | ICD-10-CM | POA: Diagnosis not present

## 2019-06-29 DIAGNOSIS — G500 Trigeminal neuralgia: Secondary | ICD-10-CM | POA: Diagnosis not present

## 2019-06-29 DIAGNOSIS — G35 Multiple sclerosis: Secondary | ICD-10-CM | POA: Diagnosis not present

## 2019-06-30 DIAGNOSIS — G500 Trigeminal neuralgia: Secondary | ICD-10-CM | POA: Diagnosis not present

## 2019-07-04 DIAGNOSIS — I10 Essential (primary) hypertension: Secondary | ICD-10-CM | POA: Diagnosis not present

## 2019-07-04 DIAGNOSIS — R21 Rash and other nonspecific skin eruption: Secondary | ICD-10-CM | POA: Diagnosis not present

## 2019-07-04 DIAGNOSIS — Z7982 Long term (current) use of aspirin: Secondary | ICD-10-CM | POA: Diagnosis not present

## 2019-07-04 DIAGNOSIS — G35 Multiple sclerosis: Secondary | ICD-10-CM | POA: Diagnosis not present

## 2019-07-04 DIAGNOSIS — E039 Hypothyroidism, unspecified: Secondary | ICD-10-CM | POA: Diagnosis not present

## 2019-07-06 DIAGNOSIS — I10 Essential (primary) hypertension: Secondary | ICD-10-CM | POA: Diagnosis not present

## 2019-07-06 DIAGNOSIS — G500 Trigeminal neuralgia: Secondary | ICD-10-CM | POA: Diagnosis not present

## 2019-07-06 DIAGNOSIS — G35 Multiple sclerosis: Secondary | ICD-10-CM | POA: Diagnosis not present

## 2019-07-07 DIAGNOSIS — G500 Trigeminal neuralgia: Secondary | ICD-10-CM | POA: Diagnosis not present

## 2019-07-07 DIAGNOSIS — D649 Anemia, unspecified: Secondary | ICD-10-CM | POA: Diagnosis not present

## 2019-07-07 DIAGNOSIS — E039 Hypothyroidism, unspecified: Secondary | ICD-10-CM | POA: Diagnosis not present

## 2019-07-07 DIAGNOSIS — I10 Essential (primary) hypertension: Secondary | ICD-10-CM | POA: Diagnosis not present

## 2019-07-07 DIAGNOSIS — Z9181 History of falling: Secondary | ICD-10-CM | POA: Diagnosis not present

## 2019-07-07 DIAGNOSIS — F419 Anxiety disorder, unspecified: Secondary | ICD-10-CM | POA: Diagnosis not present

## 2019-07-07 DIAGNOSIS — Z79891 Long term (current) use of opiate analgesic: Secondary | ICD-10-CM | POA: Diagnosis not present

## 2019-07-07 DIAGNOSIS — Z993 Dependence on wheelchair: Secondary | ICD-10-CM | POA: Diagnosis not present

## 2019-07-07 DIAGNOSIS — F329 Major depressive disorder, single episode, unspecified: Secondary | ICD-10-CM | POA: Diagnosis not present

## 2019-07-07 DIAGNOSIS — G35 Multiple sclerosis: Secondary | ICD-10-CM | POA: Diagnosis not present

## 2019-07-11 DIAGNOSIS — R52 Pain, unspecified: Secondary | ICD-10-CM | POA: Diagnosis not present

## 2019-07-11 DIAGNOSIS — Z79899 Other long term (current) drug therapy: Secondary | ICD-10-CM | POA: Diagnosis not present

## 2019-07-11 DIAGNOSIS — F0390 Unspecified dementia without behavioral disturbance: Secondary | ICD-10-CM | POA: Diagnosis not present

## 2019-07-11 DIAGNOSIS — G35 Multiple sclerosis: Secondary | ICD-10-CM | POA: Diagnosis not present

## 2019-07-11 DIAGNOSIS — G119 Hereditary ataxia, unspecified: Secondary | ICD-10-CM | POA: Diagnosis not present

## 2019-07-13 ENCOUNTER — Encounter: Payer: Self-pay | Admitting: Gynecology

## 2019-07-25 DIAGNOSIS — F419 Anxiety disorder, unspecified: Secondary | ICD-10-CM | POA: Diagnosis not present

## 2019-07-25 DIAGNOSIS — L299 Pruritus, unspecified: Secondary | ICD-10-CM | POA: Diagnosis not present

## 2019-07-25 DIAGNOSIS — F439 Reaction to severe stress, unspecified: Secondary | ICD-10-CM | POA: Diagnosis not present

## 2019-08-02 DIAGNOSIS — F411 Generalized anxiety disorder: Secondary | ICD-10-CM | POA: Diagnosis not present

## 2019-08-06 DIAGNOSIS — Z79891 Long term (current) use of opiate analgesic: Secondary | ICD-10-CM | POA: Diagnosis not present

## 2019-08-06 DIAGNOSIS — F419 Anxiety disorder, unspecified: Secondary | ICD-10-CM | POA: Diagnosis not present

## 2019-08-06 DIAGNOSIS — G500 Trigeminal neuralgia: Secondary | ICD-10-CM | POA: Diagnosis not present

## 2019-08-06 DIAGNOSIS — E039 Hypothyroidism, unspecified: Secondary | ICD-10-CM | POA: Diagnosis not present

## 2019-08-06 DIAGNOSIS — F329 Major depressive disorder, single episode, unspecified: Secondary | ICD-10-CM | POA: Diagnosis not present

## 2019-08-06 DIAGNOSIS — I10 Essential (primary) hypertension: Secondary | ICD-10-CM | POA: Diagnosis not present

## 2019-08-06 DIAGNOSIS — D649 Anemia, unspecified: Secondary | ICD-10-CM | POA: Diagnosis not present

## 2019-08-06 DIAGNOSIS — Z9181 History of falling: Secondary | ICD-10-CM | POA: Diagnosis not present

## 2019-08-06 DIAGNOSIS — G35 Multiple sclerosis: Secondary | ICD-10-CM | POA: Diagnosis not present

## 2019-08-06 DIAGNOSIS — Z993 Dependence on wheelchair: Secondary | ICD-10-CM | POA: Diagnosis not present

## 2019-08-08 DIAGNOSIS — F411 Generalized anxiety disorder: Secondary | ICD-10-CM | POA: Diagnosis not present

## 2019-08-08 DIAGNOSIS — G500 Trigeminal neuralgia: Secondary | ICD-10-CM | POA: Diagnosis not present

## 2019-08-08 DIAGNOSIS — G35 Multiple sclerosis: Secondary | ICD-10-CM | POA: Diagnosis not present

## 2019-08-08 DIAGNOSIS — G894 Chronic pain syndrome: Secondary | ICD-10-CM | POA: Diagnosis not present

## 2019-08-08 DIAGNOSIS — Z79899 Other long term (current) drug therapy: Secondary | ICD-10-CM | POA: Diagnosis not present

## 2019-08-09 DIAGNOSIS — F329 Major depressive disorder, single episode, unspecified: Secondary | ICD-10-CM | POA: Diagnosis not present

## 2019-08-09 DIAGNOSIS — G35 Multiple sclerosis: Secondary | ICD-10-CM | POA: Diagnosis not present

## 2019-08-09 DIAGNOSIS — D649 Anemia, unspecified: Secondary | ICD-10-CM | POA: Diagnosis not present

## 2019-08-09 DIAGNOSIS — I10 Essential (primary) hypertension: Secondary | ICD-10-CM | POA: Diagnosis not present

## 2019-08-09 DIAGNOSIS — E039 Hypothyroidism, unspecified: Secondary | ICD-10-CM | POA: Diagnosis not present

## 2019-08-09 DIAGNOSIS — G500 Trigeminal neuralgia: Secondary | ICD-10-CM | POA: Diagnosis not present

## 2019-09-04 DIAGNOSIS — Z23 Encounter for immunization: Secondary | ICD-10-CM | POA: Diagnosis not present

## 2019-09-05 ENCOUNTER — Telehealth: Payer: Self-pay | Admitting: *Deleted

## 2019-09-05 DIAGNOSIS — Z7982 Long term (current) use of aspirin: Secondary | ICD-10-CM | POA: Diagnosis not present

## 2019-09-05 DIAGNOSIS — I10 Essential (primary) hypertension: Secondary | ICD-10-CM | POA: Diagnosis not present

## 2019-09-05 DIAGNOSIS — D649 Anemia, unspecified: Secondary | ICD-10-CM | POA: Diagnosis not present

## 2019-09-05 DIAGNOSIS — G35 Multiple sclerosis: Secondary | ICD-10-CM | POA: Diagnosis not present

## 2019-09-05 DIAGNOSIS — F329 Major depressive disorder, single episode, unspecified: Secondary | ICD-10-CM | POA: Diagnosis not present

## 2019-09-05 DIAGNOSIS — E039 Hypothyroidism, unspecified: Secondary | ICD-10-CM | POA: Diagnosis not present

## 2019-09-05 DIAGNOSIS — Z993 Dependence on wheelchair: Secondary | ICD-10-CM | POA: Diagnosis not present

## 2019-09-05 DIAGNOSIS — G500 Trigeminal neuralgia: Secondary | ICD-10-CM | POA: Diagnosis not present

## 2019-09-05 DIAGNOSIS — Z79891 Long term (current) use of opiate analgesic: Secondary | ICD-10-CM | POA: Diagnosis not present

## 2019-09-05 DIAGNOSIS — F419 Anxiety disorder, unspecified: Secondary | ICD-10-CM | POA: Diagnosis not present

## 2019-09-05 DIAGNOSIS — Z9181 History of falling: Secondary | ICD-10-CM | POA: Diagnosis not present

## 2019-09-05 DIAGNOSIS — F411 Generalized anxiety disorder: Secondary | ICD-10-CM | POA: Diagnosis not present

## 2019-09-05 NOTE — Telephone Encounter (Signed)
Tried calling [phone]. Number not in service. LVM on mobile [phone] for pt to call about appt on Thursday. Dr. Felecia Shelling wanting to know if she can come at 9am instead of 1130am. He has to leave office early that day.

## 2019-09-08 ENCOUNTER — Other Ambulatory Visit: Payer: Self-pay

## 2019-09-08 ENCOUNTER — Ambulatory Visit (INDEPENDENT_AMBULATORY_CARE_PROVIDER_SITE_OTHER): Payer: Medicare Other | Admitting: Neurology

## 2019-09-08 ENCOUNTER — Encounter: Payer: Self-pay | Admitting: Neurology

## 2019-09-08 VITALS — BP 155/87 | HR 73 | Temp 98.3°F | Ht 62.0 in | Wt 127.0 lb

## 2019-09-08 DIAGNOSIS — Z79899 Other long term (current) drug therapy: Secondary | ICD-10-CM

## 2019-09-08 DIAGNOSIS — G35 Multiple sclerosis: Secondary | ICD-10-CM | POA: Diagnosis not present

## 2019-09-08 DIAGNOSIS — R269 Unspecified abnormalities of gait and mobility: Secondary | ICD-10-CM

## 2019-09-08 DIAGNOSIS — G500 Trigeminal neuralgia: Secondary | ICD-10-CM | POA: Diagnosis not present

## 2019-09-08 NOTE — Progress Notes (Addendum)
[facility]  PATIENT: Melanie Richardson DOB: [birth year]  REFERRING CLINICIAN: Lonell Grandchild [place] HISTORY FROM: Patient MS   REASON FOR VISIT: MS and trigeminal Neuralgia   HISTORICAL  CHIEF COMPLAINT:  Chief Complaint  Patient presents with  . Follow-up    RM 12 with son, Melanie Richardson. Last seen 03/22/2019.   . Multiple Sclerosis    Takes Tecfidera  . Trigeminal Neuralgia    Takes tegretol     HISTORY OF PRESENT ILLNESS:  Melanie Richardson is a 68 year old woman with multiple sclerosis.  Update 09/08/2019: She feels her MS is stable.  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    No exacerbatoin.  She uses a walker and does some PT.    Stumbles but no recent fall.   Balance is reduced.     Her trigeminal neuralgia is usually fine with Tegretol and prn hydrocodone.   She just averages one hydorcodone a day.  We discussed changing to prn.    She has insomnia.   Depression is doing well.   No new cognitive issues  I reviewed her labs from 07/05/19:  K is slighly low at 3.4.   Trig elevated at 178.   B12 normal.    Update 03/22/2019 (virtual) She is on Tecfidera and tolerates it well.  Lymphocyte counts were in the normal range last month she has not had any exacerbations.  She is noting more trouble with her gait since her hospitalization.  She is in a skilled nursing facility.  Two months ago she had a bad UTI and was hospitalized x 1 week prompting discharge to a nursing facility.   She is doing PT.  She is using a walker to go about feet.  She feels off balanced.   She has leg weakness  Her trigeminal neuralgia has flared up.  Of note, when she was hospitalized, the Lyrica and amitriptyline was discontinued.  She is sleeping well.  She has some depression.  Cognition is doing well.        Assessment and Plan: 1. MS (multiple sclerosis) ([place])   2. Trigeminal neuralgia   3. High risk medication use   4. Abnormality of gait     1.  Continue Tecfidera. 2.  We will  continue to treat the trigeminal neuralgia with just the Tegretol.  However, if it worsens consider adding back the pregabalin.  3.   She is continuing to get some physical therapy.  Should try to be as active as possible. 4.  Follow-up in 6 months or sooner if there are new or worsening neurologic symptoms.    Update 08/05/2018: She feels her MS is stable over the last 6 months.   She is on Tecfidera and she tolerates it well.   No recent definite exacerbation.    She uses a walker mostly but will use a wheelchair for longer substances.    No falls.    Leg strength is ok and balance is more a problem than strength.   She has some LBP, especially in rainy weather.  She has just had one episode of trigeminal neuralgia and was better after an hydrocodone.   She has no significant numbness or tingling.   She feels her vision is unchanged.   Her bladder is doing ok (mild stable urgency).      She notes fatigue is mild and she sleeps well.    Mood is doing well.  Cognition is stable.   AS her calcium was increased  in the past and vit D was normal she is not on vit D supplements  Update 02/02/2018: She continues on Tecfidera and generally tolerates it well.  She has not had any exacerbations since starting that medication.  She notes no new numbness or weakness and feels her gait is stable.  She has no recent falls.  She has had difficulties with balance more than strength and uses a walker.  She is hoping to get back to a cane.   The trigeminal neuralgia is doing very well with only one recent cluster earlier this year.   She is on lamictal and tegretol and amitriptyline.    She notes no new MS difficulty with vision.   She will be getting cataracts removed soon.   She has mild urinary frequency and urgency not bad enough to treat.  She notes some fatigue.  Most nights she sleeps well.  She denies any significant problem with depression and has mild anxiety. She has a recent divorce.     She notes no  difficulties with her cognition.     She has some LBP, mostly in the left LB and buttock.   Patches help.   She also notes some arthritis in her legs.        Update 08/04/2017:   She switched to Tecfidera May 2018 due to new activity on the MRI and an exacerbation earlier this year. She is tolerating it well.   She denies any new exacerbations.   She has not had any GI issues or flushing.     She notes no new numbness, weakness or gait issues.   No recent falls.   Balance is poor  (old) and she uses a walker.   Her trigeminal neuralgia has not acted up at all this year.   Her vision worsened when she had Bell's palsy and she feels they are doing better again.   Bladder function is doing well.   No significant urgency.     Fatigue has been better since the last visit.      She sleeps well at night.   She feels mood is doing ok and she denies much depression or anxiety.    She notes no significant problems with cognition.  Allergies are bothering her more and she has a lot of nasal drainage.   She gets lower back pain helped by lidocaine patches (OTC).    Sometimes she also has proximal leg pain.  _______________________________________ From 02/04/2017 MS:    Her MRI a couple weeks ago showed a few lesions not present in 2016 implying continued inflammation .   We discussed considering a different medication   Vision:   She notes some blurry vision on the left. She also feels the left eye is dry (facial weakness and reduced blinks are on the right)  About 7 weeks ago, she had what she feels was a sinus infection and felt off balanced.   She fell in a parking lot, hitting her head but did not lose consciousness.   About 2 weeks ago, her son noted the right face was drooping and she went to [facility] ED last week.  She did not note headache at the onset.   She noted mild lip numbness but the weakness was the main symptom   She notes water dribbling out of her mouth when she drank.    She noted mild change in vision  last week but back to baseline.      No change  in leg or arm numbness or weakness.  No change in gait or bladder.     In the ED, she was given Valtrex and prednisone.  She had a CT of the head.   It shows White matter changes c/w her MS and a scalp hematoma on her left.    MS:   She is on Rebif and tolerates it well.    Before current symptoms, she had no recent exacerbation.  Trigeminal neuralgia:    TN pain has done fairly well lately . She has had only occasional spasms of pain on the right side of her face..   She has received the most benefit from combination of the antiepileptics (Tegretol, Pregabalin and Lamotrigine) and Amitriptyline.   She is not needing as much hydrocodone for pain. Stress often triggers a spell.    In the past, her spells were so frequent and severe that activity like eating would trigger more severe pain.   In the past, she has had a gamma knife procedure (2005) and radiofrequency ablations 2. These procedures did not help her much at the time. Those procedures left her with reduced sensation on the right.  Gait/strength/sensation:   She feels off balanced and had the one fall.  Gait is worse the past 1/2 year in general, especially the past 1-2 months. .    She now must use a cane.  She has no major weakness, but her right leg seems weaker when she is hot.   She has no numbness in arms or legs.    Bladder:   She denies any bladder issues.      Vision/hearing:   She feels vision is about the same but notes some blurry vision last week, .   She feels the right hearing is not as good as the left.      Fatigue/sleep:   Fatigue is mild most days.  She continues to have some insomnia, worse with sleep maintenance. Ambien and amitriptyline help her insomnia.   Mood/cognition:   She notes depression and mild anxiety and has had more stress the past year.     LBP/leg pain:  She has low back pain and right leg radiculopathy pain at times. This is helped a lot by Lyrica. She notes  she limps some if temperatures are warmer.    MS History:   She had the onset of right sided facial numbness now pregnant during the 1970s. That numbness resolved over the next month or so. However, in the mid 1990s she began to experience pain in the same distribution. She had an MRI of the brain performed by Dr. Buzzy HanStiefel in 1998. It was consistent with multiple sclerosis.  She had an LP in the 1970's and the CSF was not significantly abnormal to diagnose MS.   She has been on Rebif since 2004.  She had an exacerbation of March 2018 with worsening gait, left visual changes. She also had facial weakness that could be a Bell's palsy.   She switch to Tecfidera May 2018 due to the exacerbation and new activity on the MRI.  REVIEW OF SYSTEMS:  Constitutional: No fevers, chills, sweats, or change in appetite.   Some fatigue and insomnia Eyes: No visual changes, double vision, eye pain Ear, nose and throat: No hearing loss, ear pain, nasal congestion, sore throat Cardiovascular: No chest pain, palpitations Respiratory:  No shortness of breath at rest or with exertion.   No wheezes GastrointestinaI: No nausea, vomiting, diarrhea, abdominal pain, fecal incontinence  Genitourinary:  No dysuria, urinary retention or frequency.  No nocturia. Musculoskeletal:  No neck pain, back pain Integumentary: No rash, pruritus, skin lesions Neurological: as above Psychiatric: No depression at this time.  No anxiety Endocrine: No palpitations, diaphoresis, change in appetite, change in weigh or increased thirst Hematologic/Lymphatic:  No anemia, purpura, petechiae. Allergic/Immunologic: No itchy/runny eyes, nasal congestion, recent allergic reactions, rashes  ALLERGIES: Allergies  Allergen Reactions  . Ciprofloxacin Other (See Comments)    Dizziness and headache  . Codeine Other (See Comments)    GI upset  . Erythromycin Other (See Comments)    Unknown    HOME MEDICATIONS: Outpatient Medications Prior to Visit   Medication Sig Dispense Refill  . albuterol (PROVENTIL HFA;VENTOLIN HFA) 108 (90 BASE) MCG/ACT inhaler Inhale 2 puffs into the lungs every 6 (six) hours as needed for wheezing. 1 Inhaler 6  . atorvastatin (LIPITOR) 10 MG tablet Take 10 mg by mouth daily.    . carbamazepine (TEGRETOL XR) 200 MG 12 hr tablet Take 1 tablet (200 mg total) by mouth 3 (three) times daily. 90 tablet 0  . cycloSPORINE (RESTASIS) 0.05 % ophthalmic emulsion Place 1 drop into both eyes 2 (two) times daily.    . feeding supplement, ENSURE ENLIVE, (ENSURE ENLIVE) LIQD Take 237 mLs by mouth 2 (two) times daily between meals. 237 mL 12  . fluticasone (FLONASE) 50 MCG/ACT nasal spray SPRAY 2 SPRAYS INTO EACH NOSTRIL EVERY DAY (Patient taking differently: Place 2 sprays into both nostrils daily as needed for allergies. ) 16 g 3  . HYDROcodone-acetaminophen (NORCO) 7.5-325 MG tablet Take 1 tablet by mouth every 8 (eight) hours as needed for moderate pain. 15 tablet 0  . hydrocortisone cream 1 % Apply 1 application topically 2 (two) times daily.    . hydrOXYzine (ATARAX/VISTARIL) 25 MG tablet Take 25 mg by mouth 3 (three) times daily as needed.    Marland Kitchen. ibuprofen (ADVIL) 400 MG tablet Take 1 tablet (400 mg total) by mouth every 6 (six) hours as needed for fever, headache or mild pain. 30 tablet 0  . ipratropium (ATROVENT) 0.06 % nasal spray Place 1 spray into the nose daily as needed for rhinitis.     Marland Kitchen. levothyroxine (SYNTHROID, LEVOTHROID) 50 MCG tablet Take 1 tablet daily before breakfast (Patient taking differently: Take 50 mcg by mouth daily. ) 90 tablet 1  . Menthol-Methyl Salicylate (MUSCLE RUB) 10-15 % CREA Apply 1 application topically as needed for muscle pain.  0  . metoprolol succinate (TOPROL-XL) 25 MG 24 hr tablet TAKE 1 TABLET BY MOUTH EVERY DAY (Patient taking differently: Take 25 mg by mouth daily. ) 90 tablet 3  . mirtazapine (REMERON) 7.5 MG tablet Take 7.5 mg by mouth at bedtime.    . Multiple Vitamin (MULTIVITAMIN)  tablet Take 1 tablet by mouth daily.    . TECFIDERA 240 MG CPDR TAKE ONE CAPSULE (240 MG) BY MOUTH TWICE DAILY. STORE IN ORIGINAL CONTAINER AT ROOM TEMPERATURE. (Patient taking differently: Take 240 mg by mouth 2 (two) times a day. TAKE ONE CAPSULE (240 MG) BY MOUTH TWICE DAILY. STORE IN ORIGINAL CONTAINER AT ROOM TEMPERATURE.) 180 capsule 3  . triamcinolone cream (KENALOG) 0.1 % Apply 1 application topically 2 (two) times daily.    Marland Kitchen. aspirin 81 MG tablet Take 81 mg by mouth daily.    . Cholecalciferol (VITAMIN D PO) Take 1,000 Units by mouth daily.     . diazepam (VALIUM) 5 MG tablet Take 1 tablet (5 mg total) by mouth  every 12 (twelve) hours as needed for anxiety. 10 tablet 0   No facility-administered medications prior to visit.     PAST MEDICAL HISTORY: Past Medical History:  Diagnosis Date  . Hypertension   . MS (multiple sclerosis) (HCC)   . Osteopenia 05/2014   T score -1.8 FRAX 16%/4.7%  . Thyroid disease    Hypothyroid  . Trigeminal neuralgia     PAST SURGICAL HISTORY: Past Surgical History:  Procedure Laterality Date  . BACK SURGERY  2011  . ELBOW SURGERY  1999   shattered elbow  . feet surgery  1990  . gamma knife  2005  . radiofrquency rhizotomy  [id number]  . underarm surgery  1988    FAMILY HISTORY: Family History  Problem Relation Age of Onset  . Heart disease Mother   . Hypertension Father   . Cancer Father        colon  . Stroke Father   . Breast cancer Maternal Aunt 50  . Breast cancer Cousin 40    SOCIAL HISTORY:  Social History   Socioeconomic History  . Marital status: Married    Spouse name: Not on file  . Number of children: 2  . Years of education: college  . Highest education level: Not on file  Occupational History    Employer: OTHER  Social Needs  . Financial resource strain: Not on file  . Food insecurity    Worry: Not on file    Inability: Not on file  . Transportation needs    Medical: Not on file    Non-medical: Not on file   Tobacco Use  . Smoking status: Current Every Day Smoker    Packs/day: 1.00    Types: Cigarettes  . Smokeless tobacco: Never Used  Substance and Sexual Activity  . Alcohol use: Yes    Alcohol/week: 0.0 standard drinks    Comment: rarely  . Drug use: No  . Sexual activity: Yes    Birth control/protection: Post-menopausal    Comment: 1st intercourse 68 yo-Fewer than 5 partners  Lifestyle  . Physical activity    Days per week: Not on file    Minutes per session: Not on file  . Stress: Not on file  Relationships  . Social Musician on phone: Not on file    Gets together: Not on file    Attends religious service: Not on file    Active member of club or organization: Not on file    Attends meetings of clubs or organizations: Not on file    Relationship status: Not on file  . Intimate partner violence    Fear of current or ex partner: Not on file    Emotionally abused: Not on file    Physically abused: Not on file    Forced sexual activity: Not on file  Other Topics Concern  . Not on file  Social History Narrative   Patient lives at home alone, has 2 children   Patient is right handed   Educational level is some college   Caffeine consumption is 3 cups a day     PHYSICAL EXAM  Vitals:   09/08/19 1127  BP: (!) 155/87  Pulse: 73  Temp: 98.3 F (36.8 C)  Weight: 127 lb (57.6 kg)  Height: 5\' 2"  (1.575 m)    Body mass index is 23.23 kg/m.   General: The patient is well-developed and well-nourished and in no acute distress    Neurologic Exam  Mental status:  The patient is alert and oriented x 3 at the time of the examination. The patient has apparent normal recent and remote memory, with an apparently normal attention span and concentration ability.   Speech is normal.  Cranial nerves: Extraocular movements are full.  Facial strength and sensation is normal.  Trapezius strength is strong.  Motor:   Muscle bulk and tone are normal. Strength is  5 / 5 in  arms and 4+/5 in legs.   .   Sensory: She had intact sensation to touch and vibration in the arms and legs.  Coordination: Cerebellar testing shows good finger-nose-finger but mildly reduced heel-to-shin bilaterally.  Gait and station: Station is normal and gait has reduced stride and is wide.   She can take steps with unilateral support but walks much better with walker .   She is unable to tandem walk.  The Romberg is positive.  Reflexes: Deep tendon reflexes are symmetric and normal in the arms and ankles and increased at the knees.Marland Kitchen     DIAGNOSTIC DATA (LABS, IMAGING, TESTING) - I reviewed patient records, labs, notes, testing and imaging myself where available.  Lab Results  Component Value Date   WBC 9.6 02/17/2019   HGB 11.2 (L) 02/17/2019   HCT 33.0 (L) 02/17/2019   MCV 93.2 02/17/2019   PLT 265 02/17/2019      Component Value Date/Time   NA 136 02/17/2019 0306   NA 137 08/04/2017 1359   K 3.8 02/17/2019 0306   CL 104 02/17/2019 0306   CO2 21 (L) 02/17/2019 0306   GLUCOSE 97 02/17/2019 0306   BUN 10 02/17/2019 0306   BUN 13 08/04/2017 1359   CREATININE 0.68 02/17/2019 0306   CREATININE 0.69 05/17/2018 1125   CALCIUM 8.9 02/17/2019 0306   PROT 6.9 02/16/2019 0434   PROT 7.6 08/05/2018 1327   ALBUMIN 3.2 (L) 02/16/2019 0434   ALBUMIN 4.6 08/05/2018 1327   AST 25 02/16/2019 0434   ALT 35 02/16/2019 0434   ALKPHOS 51 02/16/2019 0434   BILITOT 0.5 02/16/2019 0434   BILITOT 0.3 08/05/2018 1327   GFRNONAA >60 02/17/2019 0306   GFRNONAA 77 01/22/2017 1249   GFRAA >60 02/17/2019 0306   GFRAA 89 01/22/2017 1249    __________________________________________ Impression/Plan   MS (multiple sclerosis) (HCC) - Plan: Carbamazepine level, total, CMP, CBC with Differential/Platelets  Trigeminal neuralgia - Plan: Carbamazepine level, total, CMP, CBC with Differential/Platelets  High risk medication use - Plan: Carbamazepine level, total, CMP, CBC with  Differential/Platelets  Abnormality of gait    1.   Continue Tecfidera.   Check labs 2.   Continue Carbamezapine, check level and adjust if needed.,   As pain flares up most winters, we will restart the Lyrica, 3,   Stay active, use walker 4.   rtc 6 months, sooner if new or worsening issues  Richard A. Epimenio Foot, MD, PhD 09/08/2019, 12:06 PM Certified in Neurology, Clinical Neurophysiology, Sleep Medicine, Pain Medicine and Neuroimaging  [facility] [address], [address] [place], [place] [address] [phone]

## 2019-09-09 LAB — CBC WITH DIFFERENTIAL/PLATELET
Basophils Absolute: 0.1 10*3/uL (ref 0.0–0.2)
Basos: 1 %
EOS (ABSOLUTE): 0.2 10*3/uL (ref 0.0–0.4)
Eos: 3 %
Hematocrit: 39.5 % (ref 34.0–46.6)
Hemoglobin: 13.1 g/dL (ref 11.1–15.9)
Immature Grans (Abs): 0 10*3/uL (ref 0.0–0.1)
Immature Granulocytes: 0 %
Lymphocytes Absolute: 2.2 10*3/uL (ref 0.7–3.1)
Lymphs: 29 %
MCH: 30.3 pg (ref 26.6–33.0)
MCHC: 33.2 g/dL (ref 31.5–35.7)
MCV: 91 fL (ref 79–97)
Monocytes Absolute: 0.7 10*3/uL (ref 0.1–0.9)
Monocytes: 9 %
Neutrophils Absolute: 4.4 10*3/uL (ref 1.4–7.0)
Neutrophils: 58 %
Platelets: 257 10*3/uL (ref 150–450)
RBC: 4.32 x10E6/uL (ref 3.77–5.28)
RDW: 12.1 % (ref 11.7–15.4)
WBC: 7.6 10*3/uL (ref 3.4–10.8)

## 2019-09-09 LAB — COMPREHENSIVE METABOLIC PANEL
ALT: 16 IU/L (ref 0–32)
AST: 18 IU/L (ref 0–40)
Albumin/Globulin Ratio: 1.2 (ref 1.2–2.2)
Albumin: 4.2 g/dL (ref 3.8–4.8)
Alkaline Phosphatase: 100 IU/L (ref 39–117)
BUN/Creatinine Ratio: 19 (ref 12–28)
BUN: 16 mg/dL (ref 8–27)
Bilirubin Total: <0.2 mg/dL (ref 0.0–1.2)
CO2: 24 mmol/L (ref 20–29)
Calcium: 9.6 mg/dL (ref 8.7–10.3)
Chloride: 105 mmol/L (ref 96–106)
Creatinine, Ser: 0.83 mg/dL (ref 0.57–1.00)
GFR calc Af Amer: 84 mL/min/{1.73_m2} (ref >59)
GFR calc non Af Amer: 73 mL/min/{1.73_m2} (ref >59)
Globulin, Total: 3.5 g/dL (ref 1.5–4.5)
Glucose: 88 mg/dL (ref 65–99)
Potassium: 4.2 mmol/L (ref 3.5–5.2)
Sodium: 142 mmol/L (ref 134–144)
Total Protein: 7.7 g/dL (ref 6.0–8.5)

## 2019-09-09 LAB — CARBAMAZEPINE LEVEL, TOTAL: Carbamazepine (Tegretol), S: 7.3 ug/mL (ref 4.0–12.0)

## 2019-09-12 ENCOUNTER — Telehealth: Payer: Self-pay | Admitting: *Deleted

## 2019-09-12 NOTE — Telephone Encounter (Signed)
Called, LVM for pt to call office. Ok to inform son Lennette Bihari) or pt labs fine if they call. Tegretrol level was fine.

## 2019-09-12 NOTE — Telephone Encounter (Signed)
Noted, thank you

## 2019-09-12 NOTE — Telephone Encounter (Signed)
-----   Message from Britt Bottom, MD sent at 09/10/2019  8:32 PM EST ----- Please let the patient know that the lab work and tegretol level are fine

## 2019-09-12 NOTE — Telephone Encounter (Signed)
Pt son Lennette Bihari called back and message from RN Terrence Dupont was relayed with no questions

## 2019-10-24 ENCOUNTER — Telehealth: Payer: Self-pay

## 2019-10-24 DIAGNOSIS — G35 Multiple sclerosis: Secondary | ICD-10-CM

## 2019-10-24 MED ORDER — DIMETHYL FUMARATE 240 MG PO CPDR: DELAYED_RELEASE_CAPSULE | ORAL | 3 refills | Status: DC

## 2019-10-24 NOTE — Telephone Encounter (Signed)
1) Medication(s) Requested (by name): TECFIDERA 240 MG CPDR   2) [facility] [place]  3) Special Requests:  Patient son called to inform the patients insurance will no longer cover this medication but would like to know if an alternative could be prescribed that would be covered or at a lower cost.  Please follow up

## 2019-10-24 NOTE — Telephone Encounter (Signed)
Tried initiating PA on CMM but pt could not be verified. I tried calling [employer] but they are closed for the holiday today.

## 2019-10-24 NOTE — Telephone Encounter (Addendum)
Called son back. Pt at [facility] and he believes she ran out of Tecfidera but it will no longer be covered. Wanting to know alternatives. Advised we can switch her to generic but this rx would have to be sent to specialty pharmacy and the nursing home would need to order from them. Pt has Medicare/BCBS. Advised PA may be needed. [facility] phone# [phone]. Advised I will call him with update once I get things straightened out.  Called [place] and spoke with [place]. She transferred me to someone clinical. Spoke with [place]. Pt ran out of Tecfidera 10/23/2019. I relayed process of getting her switched to generic. I escribed to [facility]. Provided her their phone #. She is going to fax new orders to [place] at [phone] for [place] to sign and send back. Advised generic may require PA as well. Per Dr. Epimenio Foot, if she goes more than a week without medication, she will need to do titration again.

## 2019-10-25 NOTE — Telephone Encounter (Signed)
Called [place] at [place] to let her know I have not received fax for insurnace yet. She states she is getting busy signal on their end. She read me [facility] benefit info: ID: [id number]. RxBin:  [id number]. Grp: [id number]. PCN: [id number]. Phone number: [phone]

## 2019-10-25 NOTE — Telephone Encounter (Signed)
Submitted ugent PA via CMM. Key: [id number] - PA Case ID: [id number]. Waiting on determination from [facility] 2.

## 2019-10-25 NOTE — Telephone Encounter (Signed)
I called [employer] at [phone] and spoke with Ronaldo Miyamoto. He placed me on hold to transfer me to care management to complete PA. Their phone: [phone] opt 5. However, he came back on the line and stated pt has Medicare supplement plan in their system but member does not have a prescription drug policy.  I researched pt chart and found that PA previously done via [facility] ID: [id number]. Received the following message: " Your PA request cannot be processed for the member plan submitted. For further inquiries please contact the number on the back of the member prescription card. (Message [phone])". I called [facility] at [phone] and spoke with [place]. They have [employer]/Medicare and [employer]. She will fax me a copy to [phone].   Dx: (G35) RRMS. Pt has been on brand Tecfidera since 2018 but now it is not covered by insurance since there is generic available. She has tried/failed: rebif-ineffective.

## 2019-10-25 NOTE — Telephone Encounter (Signed)
Received determination: "Denied. This drug is not on our formulary. Your doctor gave [place] a statement (and medical records) supporting your request. This statement and our records did not show that you have tried at least two other drugs on the formulary used to treat your condition. Based on the condition provided, the other drug(s) on the formulary is/are: Betaseron, Dalfampridine, Gilenya, Glatiramer, Glatopa, and Tecfidera (All Require Prior Authorization). Tecfidera (brand product) has the same active ingredient as the generic drug."

## 2019-11-02 MED ORDER — TECFIDERA 120 & 240 MG PO MISC: ORAL | 0 refills | Status: DC

## 2019-11-02 MED ORDER — TECFIDERA 240 MG PO CPDR: DELAYED_RELEASE_CAPSULE | ORAL | 11 refills | Status: DC

## 2019-11-02 NOTE — Addendum Note (Signed)
Addended by: Hillis Range on: 11/02/2019 05:58 PM   Modules accepted: Orders

## 2019-11-02 NOTE — Telephone Encounter (Signed)
Dr. Sater- how would you like to proceed? 

## 2019-11-02 NOTE — Telephone Encounter (Addendum)
Called and spoke with son, [name]. He states [name] is who told him Brand Tecfidera no longer covered and generic needed to be called in. He was told brand name would cost 2500/month. Advised I will call [facility] to try and get some more info. If cost of brand Tecfidera is going to be too high, we will have to discuss a different medication. I will keep him updated once I know more. He verbalized understanding. I tried initiating PA for Tecfidera starter pack on CMM. UYE:[id number]. Received the following response: "PA not required". I e-scribed starter and maintenance Tecfidera rx to [facility]. I will f/u tomorrow to see if they know what the cost will be.

## 2019-11-02 NOTE — Telephone Encounter (Signed)
Olivia from [facility] called stating that they have reached the maximum of attempt of reaching out to the pt on the cell number. She states she will try the home number in the chart and if they are not able to reach the pt they will put the medication on hold until the pt reaches out to them.

## 2019-11-03 NOTE — Telephone Encounter (Signed)
Called [facility] and spoke with Milligan. She states pt copay going to be $2,495 for brand Tecfidera. She sent e-mail to their financial service department to see if there is any assistance. They usually reach out to [employer] for this. I emailed our rep, Ruthe Mannan with [employer] to see if there is anything we could do for this pt given her situation. Marked urgent, waiting on response.

## 2019-11-03 NOTE — Telephone Encounter (Addendum)
I called [name] and spoke with Melanie Richardson. Advised her that dimethyl fumarate not on formulary list. Melanie Richardson is but high copay. We are waiting on response from [employer] to see if any assistance can be given. She stated they just spoke with [facility] today who stated son was going to try and reinstate for another year. They are unsure for what. Their phone is [phone]. I called to find out more info. I spoke with rep. They spoke with JJ at [place] and they gave them info for copay assistance [facility] foundation) for son to call and see if they can get assistance. She transferred me to this department and spoke with [facility]. Phone number for copay assistance: [phone] [facility]- foundation). I called son, Melanie Richardson. He is going to call [employer] to see if they are able to offer any assistance and call back to let know.

## 2019-11-07 ENCOUNTER — Other Ambulatory Visit: Payer: Self-pay | Admitting: *Deleted

## 2019-11-07 DIAGNOSIS — G35 Multiple sclerosis: Secondary | ICD-10-CM

## 2019-11-07 MED ORDER — TECFIDERA 240 MG PO CPDR: DELAYED_RELEASE_CAPSULE | ORAL | 11 refills | Status: DC

## 2019-11-07 MED ORDER — TECFIDERA 120 & 240 MG PO MISC: ORAL | 0 refills | Status: DC

## 2019-11-07 NOTE — Telephone Encounter (Signed)
Son called earlier and requested rx's be sent to [facility]. I escribed rx's. I also received update from crystal with [employer]: "The son was transferred to [place] today to schedule the shipment."

## 2019-11-07 NOTE — Telephone Encounter (Signed)
Thank you for getting this straightened out for her.

## 2019-11-07 NOTE — Telephone Encounter (Signed)
Received response from [employer] (case manager for [employer]) that: "patient was enrolled into the free drug program on 1/28. [facility] will be the servicing pharmacy if needed, their phone number is [phone]  & fax number is [phone]."

## 2019-11-09 ENCOUNTER — Telehealth: Payer: Self-pay

## 2019-11-09 NOTE — Telephone Encounter (Signed)
[facility]anna with [facility] called to advise that they are not able to get in contact with the patient in order to fill her Tecfidera

## 2019-11-09 NOTE — Telephone Encounter (Signed)
Called back to let them know to cancel orders for Tecfidera. Pt receiving from another pharmacy. Spoke with Maddie who transferred me to [facility]. She cx rx's on file for Tecfidera. Nothing further needed.

## 2020-02-15 ENCOUNTER — Other Ambulatory Visit: Payer: Self-pay | Admitting: Gerontology

## 2020-02-15 DIAGNOSIS — Z1231 Encounter for screening mammogram for malignant neoplasm of breast: Secondary | ICD-10-CM

## 2020-03-01 ENCOUNTER — Telehealth: Payer: Self-pay | Admitting: *Deleted

## 2020-03-01 NOTE — Telephone Encounter (Signed)
Called [facility] back because it appears they faxed in the start form to [employer] instead of sending back to [place]. We received a fax from [employer] that the form was incomplete. I spoke with [place]. She placed me on hold. She states Toniann Fail has emergency she is dealing with. She took message to have them fax the vumerity start form to me at [phone]. They faxed to [employer] by mistake. She verbalized understanding.

## 2020-03-01 NOTE — Telephone Encounter (Signed)
Called, LVM for son, [name] to let him know that [employer] is no longer going to be providing free drug for Tecfidera. Unless she has met deductible or in donut hole then we will need to change medication. Dr. Felecia Shelling recommends Vumerity. This is very similar and [employer] would be able to offer free drug if need be. Asked him to call back to discuss.   I called [facility] and spoke with [place]. She transferred me to [place]. She confirmed patient is still taking brand Tecfidera and they just received a new bottle. Advised we are going to try and switch her to Vumerity since [employer] no longer offering assistance. I faxed over start form for pt to sign at [phone]. Received fax confirmation. Marked Urgent. Asked they try and fax back today. She verbalized understanding.

## 2020-03-06 NOTE — Telephone Encounter (Signed)
Initiated PA Vumerity starter pack on CMM. Key: [id number]. Waiting on questions to be available from [facility] to continue.

## 2020-03-06 NOTE — Telephone Encounter (Signed)
Submitted PA, waiting on determination from [facility].

## 2020-03-06 NOTE — Telephone Encounter (Signed)
Received signed copy back from nursing home. Faxed completed/signed Vumerity start form to [employer] at [phone]. Received fax confirmation.

## 2020-03-08 ENCOUNTER — Other Ambulatory Visit: Payer: Self-pay | Admitting: Gerontology

## 2020-03-08 ENCOUNTER — Ambulatory Visit: Payer: Medicare Other | Admitting: Neurology

## 2020-03-08 DIAGNOSIS — Z1231 Encounter for screening mammogram for malignant neoplasm of breast: Secondary | ICD-10-CM

## 2020-03-13 NOTE — Telephone Encounter (Addendum)
PA for starter and maintenance dose was approved via [facility] 03/06/20 until further notice. I had emailed Crstyal P ([employer] rep) 03/07/20 asking she reach out to [facility] to assist on ordering med, ect. I emailed Crystal to f/u with son about getting her screened for the free drug program. Waiting on response.

## 2020-03-13 NOTE — Telephone Encounter (Signed)
Pt's son [name] called in ( ok per dpr). He sts he has been in contact with [employer] and [facility] about pt's Vumerity today and was advised the pt would have a 2000$ out of pocket cost for this med.  Med shipment was scheduled for 03/15/2020 but has been placed on hold due to this price. I asked if he had heard from the free drug program and he stated no. I also informed we had submitted the PA for the medication and have not received a PA determination back as of yet.  [name] is asking for call back to discuss next steps on how to enroll in the free drug program

## 2020-03-13 NOTE — Telephone Encounter (Signed)
Received the following message back from Crystal: "Hi Kara Mead! I just called [name] & transferred him to our financial assistance team for screening. I'll provide you with an update asap"

## 2020-03-14 NOTE — Telephone Encounter (Signed)
Received the following message via email from Crystal: "Melanie Richardson was enrolled in the free drug program today. I expect to be able to schedule the shipment this week".

## 2020-03-15 ENCOUNTER — Ambulatory Visit: Payer: Medicare Other

## 2020-03-16 ENCOUNTER — Encounter: Payer: Self-pay | Admitting: Neurology

## 2020-04-05 ENCOUNTER — Telehealth: Payer: Self-pay | Admitting: Neurology

## 2020-04-05 ENCOUNTER — Ambulatory Visit: Payer: Medicare Other | Admitting: Neurology

## 2020-04-05 IMAGING — US ULTRASOUND ABDOMEN LIMITED
1 series · 14 of 25 positions shown · non-contrast
Comparison: None.

CLINICAL DATA: Abnormal LFTs x2 days.

EXAM:
ULTRASOUND ABDOMEN LIMITED RIGHT UPPER QUADRANT

[Series 1: ultrasound abdomen limited · 14 of 83 slices shown]
[im 1/83]
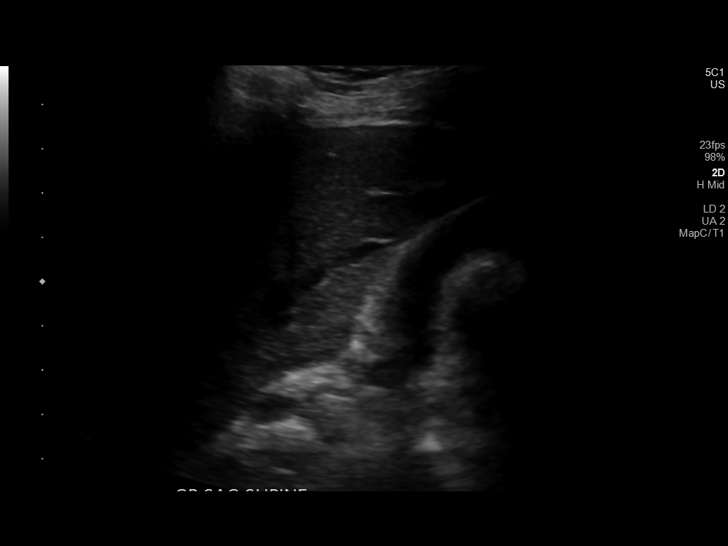
[im 7/83]
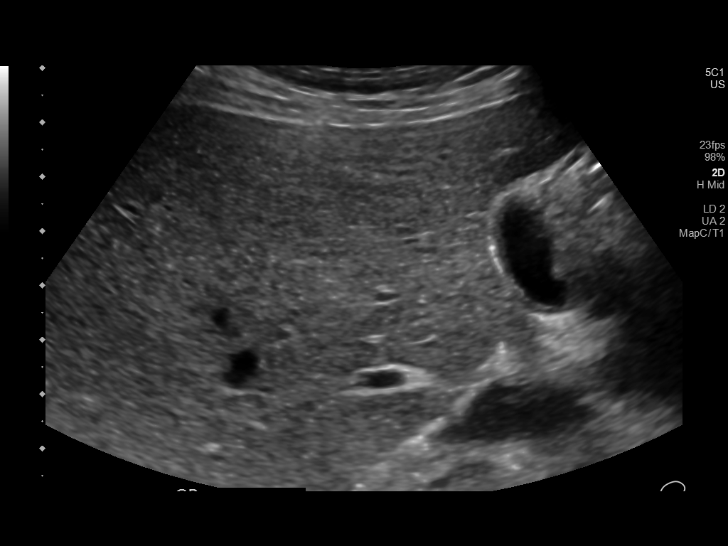
[im 14/83]
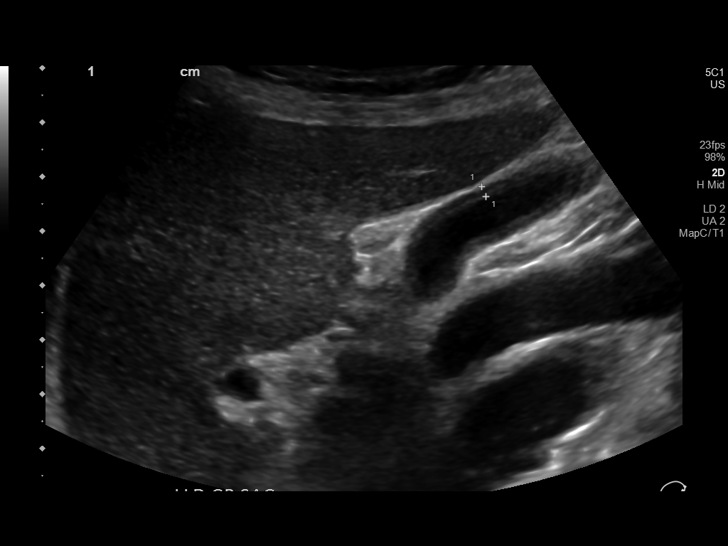
[im 21/83]
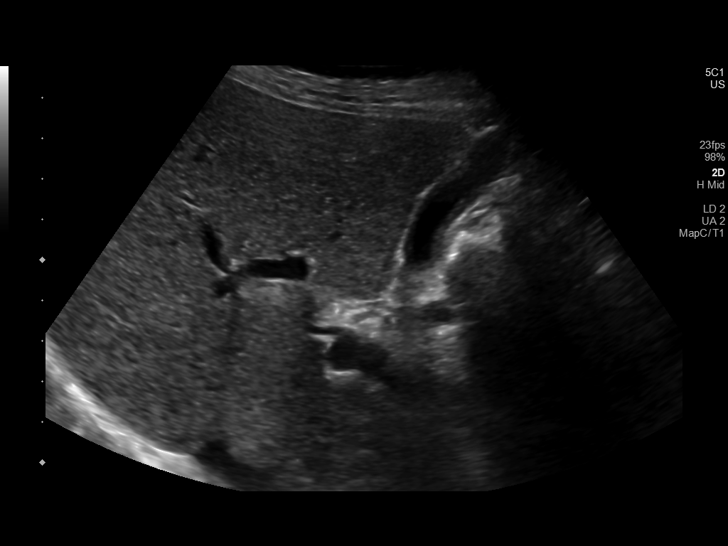
[im 28/83]
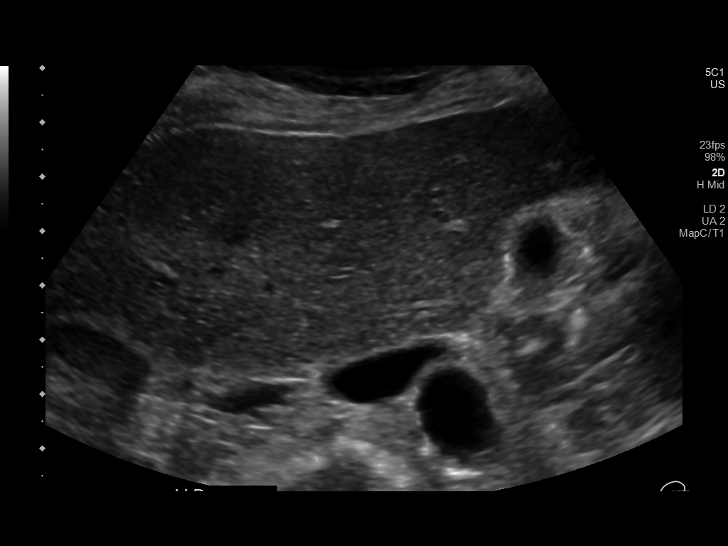
[im 31/83]
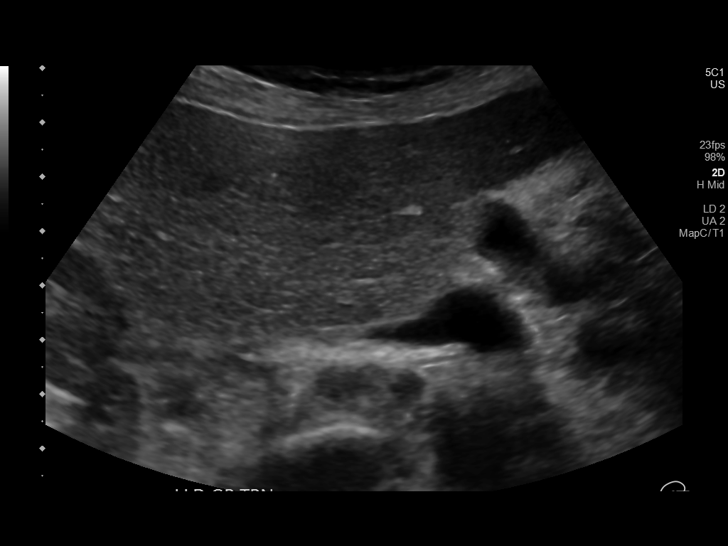
[im 38/83]
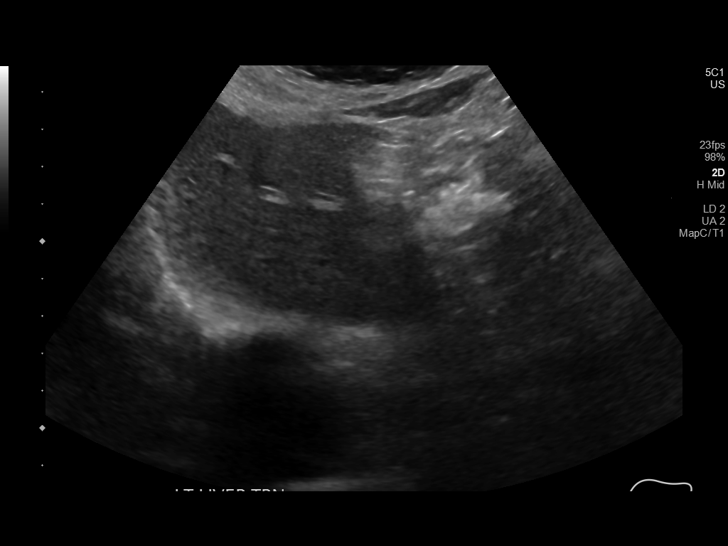
[im 45/83]
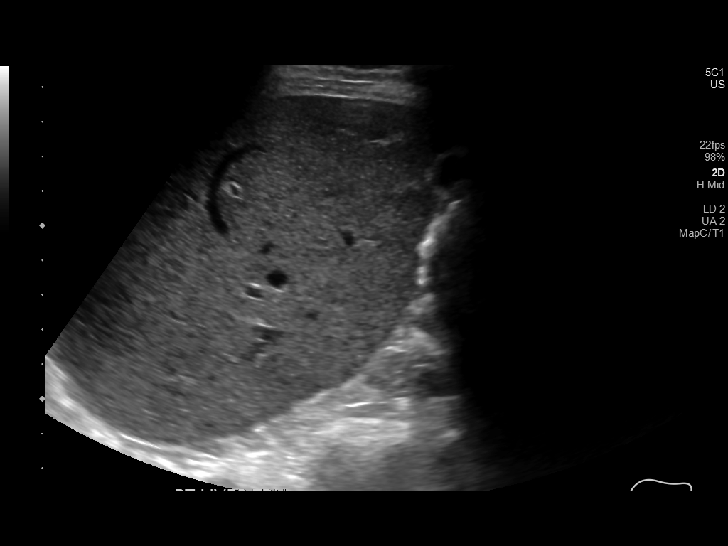
[im 52/83]
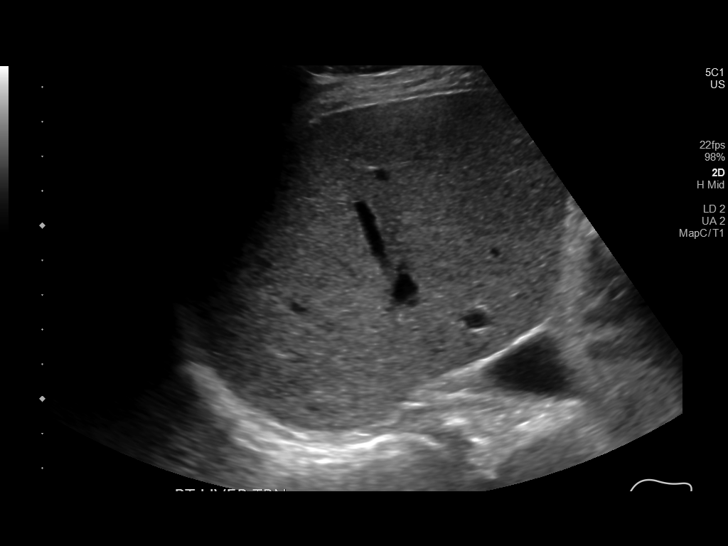
[im 55/83]
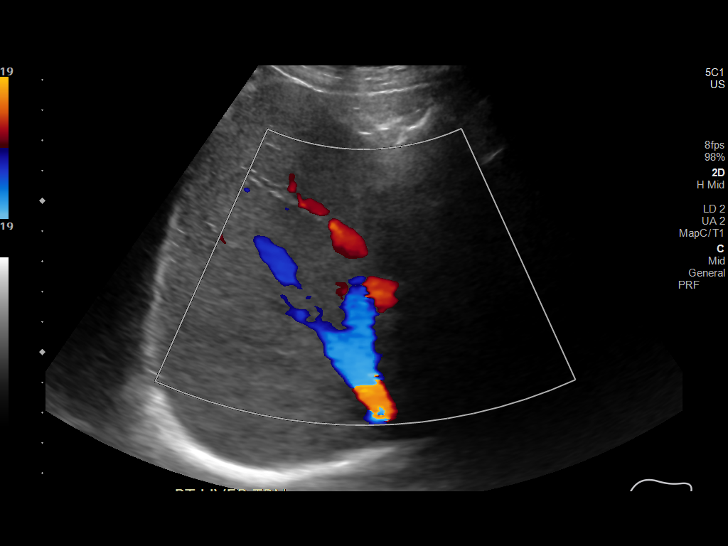
[im 62/83]
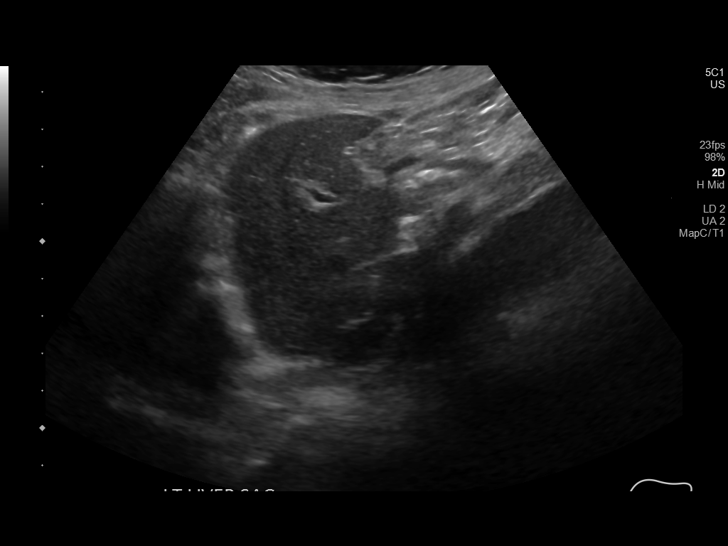
[im 69/83]
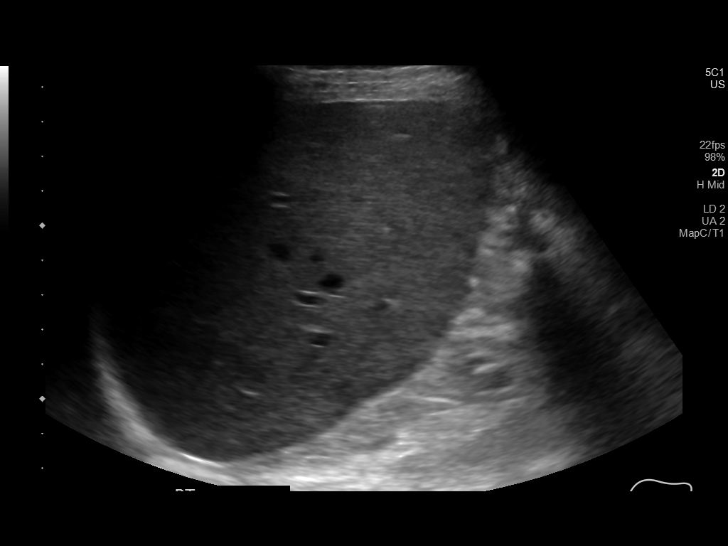
[im 76/83]
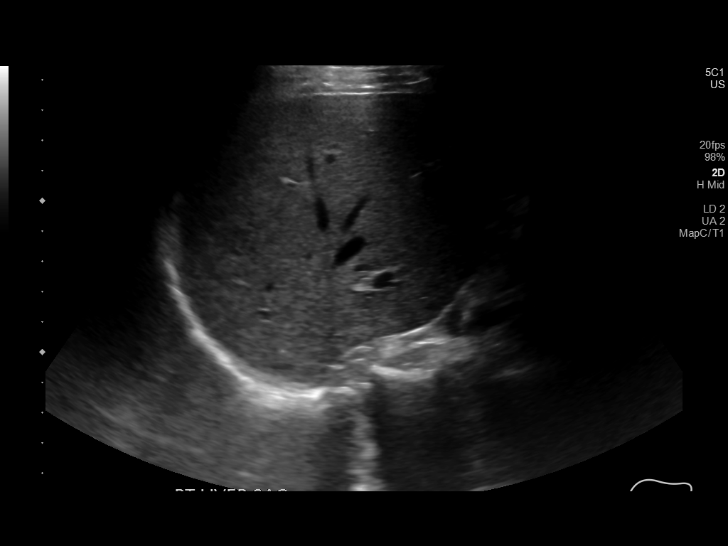
[im 83/83]
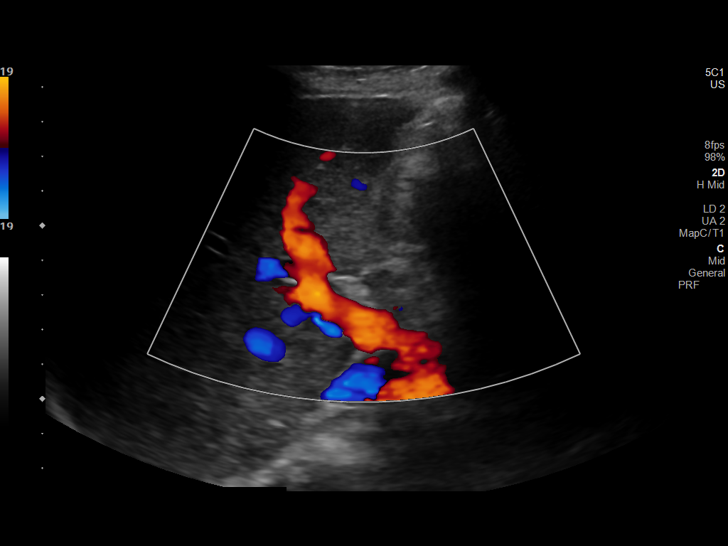

[14 of 25 positions shown; findings below may reference images not displayed]

FINDINGS: Gallbladder:

No gallstones or wall thickening visualized. No sonographic Murphy
sign noted by sonographer.

Common bile duct:

Diameter: 0.4 cm

Liver:

No focal lesion identified. Within normal limits in parenchymal
echogenicity. Portal vein is patent on color Doppler imaging with
normal direction of blood flow towards the liver.
IMPRESSION: No acute sonographic abnormality detected. No sonographic evidence
for cholelithiasis.

## 2020-04-05 NOTE — Telephone Encounter (Signed)
Melanie Richardson, pt came in today for 7/1 visit that was cancelled with her son. She is in a facility and didn't get the letter sent about reschedule. The son Karalyn would like to see if you can get her in a with a later time so he will be able to come to the appt. The later the better if possible so if you can please call him directly with the change. I updated the contact in the system to Jood for all calls. Thank you for your help

## 2020-04-05 NOTE — Telephone Encounter (Signed)
Hi Michele, Dr. Epimenio Foot has openings on 05/08/20 that you can offer. Can you please call him back and offer one of those dates/times?  Thank you

## 2020-04-10 ENCOUNTER — Ambulatory Visit: Payer: Medicare Other

## 2020-04-24 ENCOUNTER — Ambulatory Visit: Payer: Medicare Other | Admitting: Neurology

## 2020-05-08 ENCOUNTER — Ambulatory Visit (INDEPENDENT_AMBULATORY_CARE_PROVIDER_SITE_OTHER): Payer: Medicare Other | Admitting: Neurology

## 2020-05-08 ENCOUNTER — Encounter: Payer: Self-pay | Admitting: Neurology

## 2020-05-08 ENCOUNTER — Telehealth: Payer: Self-pay | Admitting: *Deleted

## 2020-05-08 VITALS — BP 130/80 | HR 60 | Ht 62.0 in | Wt 140.0 lb

## 2020-05-08 DIAGNOSIS — G35 Multiple sclerosis: Secondary | ICD-10-CM | POA: Diagnosis not present

## 2020-05-08 DIAGNOSIS — Z79899 Other long term (current) drug therapy: Secondary | ICD-10-CM | POA: Diagnosis not present

## 2020-05-08 DIAGNOSIS — G500 Trigeminal neuralgia: Secondary | ICD-10-CM

## 2020-05-08 NOTE — Progress Notes (Signed)
[facility]  PATIENT: Melanie Richardson DOB: [birth year]  REFERRING CLINICIAN: Kingsley Spittle [place] Richardson HISTORY FROM: Patient MS   REASON FOR VISIT: MS and trigeminal Neuralgia   HISTORICAL  CHIEF COMPLAINT:  Chief Complaint  Patient presents with  . Follow-up    RM 12 with son. Last seen 09/08/2019. Lives at [facility]. No new symptoms.   . Multiple Sclerosis    On Vumerity. Receives from free drug program via [employer].   . Gait Problem    In WC in office. Ambulates with cane. Able to stand to obtain weight in office.     HISTORY OF PRESENT ILLNESS:  Melanie Richardson is a 69 year old woman with multiple sclerosis.  Update 05/08/2020: Her MS has been stable.   She is in [place] in the Assisted Living section.   She is able to transfer in and out of her wheelchair and slef propels in a manual wheelchair mostly.  She uses a Licensed conveyancer and can some as well.    She tries to exercise some.    She gets her meals in the cafeteria.    Her right leg is slightly weaker than the left.  She has some spasticity in the legs.  She has some lower back pain.   Trigeminal neuralgia has done well with Tegretol and prn hydrocodone.   She has urinary urgency.  She feels worse with weather changes.     She is sleeping well most nights.    She takes night time medications. at 8 pm and goes to sleep a little later.   She gets woken up around 5 am.   Mood is doing well.   No new cognitive issues.  She continues to note some reduced focus/attention.  She has friends in the assisted living.     She has had the covid-19 vaccination.     MS History:   She had the onset of right sided facial numbness now pregnant during the 1970s. That numbness resolved over the next month or so. However, in the mid 1990s she began to experience pain in the same distribution. She had an MRI of the brain performed by Dr. Buzzy Richardson in 1998. It was consistent with multiple sclerosis.  She had an LP in the 1970's and the  CSF was not significantly abnormal to diagnose MS.   She has been on Rebif since 2004.  She had an exacerbation of March 2018 with worsening gait, left visual changes. She also had facial weakness that could be a Bell's palsy.   She switch to Tecfidera May 2018 due to the exacerbation and new activity on the MRI.  MRI 01/15/2017 shows multiple T2/FLAIR hyperintense foci in the periventricular, juxtacortical and deep white matter and also in the pons and right middle cerebral peduncle.  None of the foci enhance.   REVIEW OF SYSTEMS:  Constitutional: No fevers, chills, sweats, or change in appetite.   Some fatigue and insomnia Eyes: No visual changes, double vision, eye pain Ear, nose and throat: No hearing loss, ear pain, nasal congestion, sore throat Cardiovascular: No chest pain, palpitations Respiratory:  No shortness of breath at rest or with exertion.   No wheezes GastrointestinaI: No nausea, vomiting, diarrhea, abdominal pain, fecal incontinence Genitourinary:  No dysuria, urinary retention or frequency.  No nocturia. Musculoskeletal:  No neck pain, back pain Integumentary: No rash, pruritus, skin lesions Neurological: as above Psychiatric: No depression at this time.  No anxiety Endocrine: No palpitations, diaphoresis, change in appetite, change in weigh  or increased thirst Hematologic/Lymphatic:  No anemia, purpura, petechiae. Allergic/Immunologic: No itchy/runny eyes, nasal congestion, recent allergic reactions, rashes  ALLERGIES: Allergies  Allergen Reactions  . Ciprofloxacin Other (See Comments)    Dizziness and headache  . Codeine Other (See Comments)    GI upset  . Erythromycin Other (See Comments)    Unknown    HOME MEDICATIONS: Outpatient Medications Prior to Visit  Medication Sig Dispense Refill  . albuterol (PROVENTIL HFA;VENTOLIN HFA) 108 (90 BASE) MCG/ACT inhaler Inhale 2 puffs into the lungs every 6 (six) hours as needed for wheezing. 1 Inhaler 6  . aspirin 81 MG  tablet Take 81 mg by mouth daily.    Marland Kitchen atorvastatin (LIPITOR) 10 MG tablet Take 10 mg by mouth daily.    . carbamazepine (TEGRETOL XR) 200 MG 12 hr tablet Take 1 tablet (200 mg total) by mouth 3 (three) times daily. 90 tablet 0  . Cholecalciferol (VITAMIN D PO) Take 1,000 Units by mouth daily.     . cycloSPORINE (RESTASIS) 0.05 % ophthalmic emulsion Place 1 drop into both eyes 2 (two) times daily.    . diazepam (VALIUM) 5 MG tablet Take 1 tablet (5 mg total) by mouth every 12 (twelve) hours as needed for anxiety. 10 tablet 0  . feeding supplement, ENSURE ENLIVE, (ENSURE ENLIVE) LIQD Take 237 mLs by mouth 2 (two) times daily between meals. 237 mL 12  . fluticasone (FLONASE) 50 MCG/ACT nasal spray SPRAY 2 SPRAYS INTO EACH NOSTRIL EVERY DAY (Patient taking differently: Place 2 sprays into both nostrils daily as needed for allergies. ) 16 g 3  . HYDROcodone-acetaminophen (NORCO) 7.5-325 MG tablet Take 1 tablet by mouth every 8 (eight) hours as needed for moderate pain. 15 tablet 0  . hydrocortisone cream 1 % Apply 1 application topically 2 (two) times daily.    . hydrOXYzine (ATARAX/VISTARIL) 25 MG tablet Take 25 mg by mouth 3 (three) times daily as needed.    Marland Kitchen ibuprofen (ADVIL) 400 MG tablet Take 1 tablet (400 mg total) by mouth every 6 (six) hours as needed for fever, headache or mild pain. 30 tablet 0  . ipratropium (ATROVENT) 0.06 % nasal spray Place 1 spray into the nose daily as needed for rhinitis.     Marland Kitchen levothyroxine (SYNTHROID, LEVOTHROID) 50 MCG tablet Take 1 tablet daily before breakfast (Patient taking differently: Take 50 mcg by mouth daily. ) 90 tablet 1  . Menthol-Methyl Salicylate (MUSCLE RUB) 10-15 % CREA Apply 1 application topically as needed for muscle pain.  0  . metoprolol succinate (TOPROL-XL) 25 MG 24 hr tablet TAKE 1 TABLET BY MOUTH EVERY DAY (Patient taking differently: Take 25 mg by mouth daily. ) 90 tablet 3  . mirtazapine (REMERON) 7.5 MG tablet Take 7.5 mg by mouth at  bedtime.    . Multiple Vitamin (MULTIVITAMIN) tablet Take 1 tablet by mouth daily.    . TECFIDERA 120 & 240 MG MISC Take 120mg  by mouth twice daily x7 days then 240mg  twice daily thereafter 60 each 0  . TECFIDERA 240 MG CPDR Take 1 capsule by mouth twice daily 60 capsule 11  . triamcinolone cream (KENALOG) 0.1 % Apply 1 application topically 2 (two) times daily.     No facility-administered medications prior to visit.    PAST MEDICAL HISTORY: Past Medical History:  Diagnosis Date  . Hypertension   . MS (multiple sclerosis) (HCC)   . Osteopenia 05/2014   T score -1.8 FRAX 16%/4.7%  . Thyroid disease  Hypothyroid  . Trigeminal neuralgia     PAST SURGICAL HISTORY: Past Surgical History:  Procedure Laterality Date  . BACK SURGERY  2011  . ELBOW SURGERY  1999   shattered elbow  . feet surgery  1990  . gamma knife  2005  . radiofrquency rhizotomy  [id number]  . underarm surgery  1988    FAMILY HISTORY: Family History  Problem Relation Age of Onset  . Heart disease Mother   . Hypertension Father   . Cancer Father        colon  . Stroke Father   . Breast cancer Maternal Aunt 50  . Breast cancer Cousin 40    SOCIAL HISTORY:  Social History   Socioeconomic History  . Marital status: Married    Spouse name: Not on file  . Number of children: 2  . Years of education: college  . Highest education level: Not on file  Occupational History    Employer: OTHER  Tobacco Use  . Smoking status: Current Every Day Smoker    Packs/day: 1.00    Types: Cigarettes  . Smokeless tobacco: Never Used  Substance and Sexual Activity  . Alcohol use: Yes    Alcohol/week: 0.0 standard drinks    Comment: rarely  . Drug use: No  . Sexual activity: Yes    Birth control/protection: Post-menopausal    Comment: 1st intercourse 69 yo-Fewer than 5 partners  Other Topics Concern  . Not on file  Social History Narrative   Patient lives at home alone, has 2 children   Patient is right  handed   Educational level is some college   Caffeine consumption is 3 cups a day   Social Determinants of Health   Financial Resource Strain:   . Difficulty of Paying Living Expenses:   Food Insecurity:   . Worried About Programme researcher, broadcasting/film/video in the Last Year:   . Barista in the Last Year:   Transportation Needs:   . Freight forwarder (Medical):   Marland Kitchen Lack of Transportation (Non-Medical):   Physical Activity:   . Days of Exercise per Week:   . Minutes of Exercise per Session:   Stress:   . Feeling of Stress :   Social Connections:   . Frequency of Communication with Friends and Family:   . Frequency of Social Gatherings with Friends and Family:   . Attends Religious Services:   . Active Member of Clubs or Organizations:   . Attends Banker Meetings:   Marland Kitchen Marital Status:   Intimate Partner Violence:   . Fear of Current or Ex-Partner:   . Emotionally Abused:   Marland Kitchen Physically Abused:   . Sexually Abused:      PHYSICAL EXAM  Vitals:   05/08/20 1548  BP: 130/80  Pulse: 60  SpO2: 98%  Weight: 140 lb (63.5 kg)  Height:  (1.575 m)    Body mass index is 25.61 kg/m.   General: The patient is well-developed and well-nourished and in no acute distress    Neurologic Exam  Mental status: The patient is alert and oriented x 3 at the time of the examination. The patient has apparent normal recent and remote memory, with an apparently normal attention span and concentration ability.   Speech is normal.  Cranial nerves: Extraocular movements are full.  Facial strength and sensation is normal.  Trapezius strength is strong.  Motor:   Muscle bulk and tone are normal. Strength is  5 /  5 in arms and 4+/5 in legs.   .   Sensory: Intact sensation to touch and vibration in the arms and legs.  Coordination: Cerebellar testing shows good finger-nose-finger but mildly reduced heel-to-shin bilaterally.  Gait and station: Station is normal and gait has  reduced stride and is wide.   She can take steps with unilateral support but walks much better with walker .   She is unable to tandem walk.  The Romberg is positive.  Reflexes: Deep tendon reflexes are symmetric and normal in the arms and ankles and increased at the knees.Marland Kitchen     DIAGNOSTIC DATA (LABS, IMAGING, TESTING) - I reviewed patient records, labs, notes, testing and imaging myself where available.  Lab Results  Component Value Date   WBC 7.6 09/08/2019   HGB 13.1 09/08/2019   HCT 39.5 09/08/2019   MCV 91 09/08/2019   PLT 257 09/08/2019      Component Value Date/Time   NA 142 09/08/2019 1207   K 4.2 09/08/2019 1207   CL 105 09/08/2019 1207   CO2 24 09/08/2019 1207   GLUCOSE 88 09/08/2019 1207   GLUCOSE 97 02/17/2019 0306   BUN 16 09/08/2019 1207   CREATININE 0.83 09/08/2019 1207   CREATININE 0.69 05/17/2018 1125   CALCIUM 9.6 09/08/2019 1207   PROT 7.7 09/08/2019 1207   ALBUMIN 4.2 09/08/2019 1207   AST 18 09/08/2019 1207   ALT 16 09/08/2019 1207   ALKPHOS 100 09/08/2019 1207   BILITOT <0.2 09/08/2019 1207   GFRNONAA 73 09/08/2019 1207   GFRNONAA 77 01/22/2017 1249   GFRAA 84 09/08/2019 1207   GFRAA 89 01/22/2017 1249    __________________________________________ Impression/Plan   MS (multiple sclerosis) (HCC) - Plan: Carbamazepine level, total, Comprehensive metabolic panel, CBC with Differential/Platelet  Trigeminal neuralgia - Plan: Carbamazepine level, total, Comprehensive metabolic panel, CBC with Differential/Platelet  High risk medication use - Plan: Carbamazepine level, total, Comprehensive metabolic panel, CBC with Differential/Platelet   1.   Continue Vumerity.   Check labs 2.   Continue Carbamezapine, check level and adjust if needed.,   We discussed that if pain flares up again we can add Lyrica back. 3,   Stay active, use walker 4.   rtc 6 months, sooner if new or worsening issues  Latravion Graves A. Epimenio Foot, MD, PhD 05/08/2020, 8:54 PM Certified in  Neurology, Clinical Neurophysiology, Sleep Medicine, Pain Medicine and Neuroimaging  [facility] [address], [address] [place], [place] [address] [phone]

## 2020-05-08 NOTE — Telephone Encounter (Signed)
[facility] at [phone]. Spoke with Ramos. She transferred me to [facility]. She will print med list and fax to [place] at [phone] [facility]. Aware pt was sent with no paperwork.

## 2020-05-09 ENCOUNTER — Telehealth: Payer: Self-pay | Admitting: *Deleted

## 2020-05-09 LAB — COMPREHENSIVE METABOLIC PANEL
ALT: 17 IU/L (ref 0–32)
AST: 17 IU/L (ref 0–40)
Albumin/Globulin Ratio: 1.3 (ref 1.2–2.2)
Albumin: 4.4 g/dL (ref 3.8–4.8)
Alkaline Phosphatase: 123 IU/L — ABNORMAL HIGH (ref 48–121)
BUN/Creatinine Ratio: 21 (ref 12–28)
BUN: 16 mg/dL (ref 8–27)
Bilirubin Total: <0.2 mg/dL (ref 0.0–1.2)
CO2: 23 mmol/L (ref 20–29)
Calcium: 9.4 mg/dL (ref 8.7–10.3)
Chloride: 100 mmol/L (ref 96–106)
Creatinine, Ser: 0.77 mg/dL (ref 0.57–1.00)
GFR calc Af Amer: 91 mL/min/{1.73_m2} (ref >59)
GFR calc non Af Amer: 79 mL/min/{1.73_m2} (ref >59)
Globulin, Total: 3.4 g/dL (ref 1.5–4.5)
Glucose: 101 mg/dL — ABNORMAL HIGH (ref 65–99)
Potassium: 4.5 mmol/L (ref 3.5–5.2)
Sodium: 136 mmol/L (ref 134–144)
Total Protein: 7.8 g/dL (ref 6.0–8.5)

## 2020-05-09 LAB — CBC WITH DIFFERENTIAL/PLATELET
Basophils Absolute: 0.2 10*3/uL (ref 0.0–0.2)
Basos: 2 %
EOS (ABSOLUTE): 1.1 10*3/uL — ABNORMAL HIGH (ref 0.0–0.4)
Eos: 11 %
Hematocrit: 38.7 % (ref 34.0–46.6)
Hemoglobin: 13.1 g/dL (ref 11.1–15.9)
Immature Grans (Abs): 0 10*3/uL (ref 0.0–0.1)
Immature Granulocytes: 0 %
Lymphocytes Absolute: 2.6 10*3/uL (ref 0.7–3.1)
Lymphs: 25 %
MCH: 30.7 pg (ref 26.6–33.0)
MCHC: 33.9 g/dL (ref 31.5–35.7)
MCV: 91 fL (ref 79–97)
Monocytes Absolute: 1 10*3/uL — ABNORMAL HIGH (ref 0.1–0.9)
Monocytes: 10 %
Neutrophils Absolute: 5.3 10*3/uL (ref 1.4–7.0)
Neutrophils: 52 %
Platelets: 353 10*3/uL (ref 150–450)
RBC: 4.27 x10E6/uL (ref 3.77–5.28)
RDW: 12.6 % (ref 11.7–15.4)
WBC: 10.2 10*3/uL (ref 3.4–10.8)

## 2020-05-09 LAB — CARBAMAZEPINE LEVEL, TOTAL: Carbamazepine (Tegretol), S: 9.9 ug/mL (ref 4.0–12.0)

## 2020-05-09 NOTE — Telephone Encounter (Signed)
-----   Message from Asa Lente, MD sent at 05/09/2020  3:36 PM EDT ----- Please let the patient know that the lab work is fine.

## 2020-05-09 NOTE — Telephone Encounter (Signed)
[facility] at [phone] and spoke with [place]. She transferred me to [place] asked that lab results be faxed to them at [phone] [facility]. I faxed and received confirmation.

## 2020-11-07 ENCOUNTER — Telehealth: Payer: Self-pay | Admitting: Neurology

## 2020-11-07 DIAGNOSIS — G35 Multiple sclerosis: Secondary | ICD-10-CM

## 2020-11-07 MED ORDER — VUMERITY 231 MG PO CPDR: 2.0000 | DELAYED_RELEASE_CAPSULE | Freq: Two times a day (BID) | ORAL | 11 refills | Status: DC

## 2020-11-07 NOTE — Telephone Encounter (Signed)
Noted, new rx sent to [facility].

## 2020-11-07 NOTE — Telephone Encounter (Signed)
Pt's son, Kimiye Strathman called, would like to change the pharmacy for [facility] to [facility]. She has an appt 2/3 wanted to let you know about the change of pharmacy.

## 2020-11-08 ENCOUNTER — Encounter: Payer: Self-pay | Admitting: Family Medicine

## 2020-11-08 ENCOUNTER — Ambulatory Visit (INDEPENDENT_AMBULATORY_CARE_PROVIDER_SITE_OTHER): Payer: Medicare Other | Admitting: Family Medicine

## 2020-11-08 ENCOUNTER — Other Ambulatory Visit: Payer: Self-pay

## 2020-11-08 VITALS — BP 157/95 | HR 77 | Ht 65.5 in | Wt 135.0 lb

## 2020-11-08 DIAGNOSIS — G500 Trigeminal neuralgia: Secondary | ICD-10-CM | POA: Diagnosis not present

## 2020-11-08 DIAGNOSIS — R269 Unspecified abnormalities of gait and mobility: Secondary | ICD-10-CM | POA: Diagnosis not present

## 2020-11-08 DIAGNOSIS — G35 Multiple sclerosis: Secondary | ICD-10-CM | POA: Diagnosis not present

## 2020-11-08 DIAGNOSIS — G35D Multiple sclerosis, unspecified: Secondary | ICD-10-CM

## 2020-11-08 DIAGNOSIS — Z79899 Other long term (current) drug therapy: Secondary | ICD-10-CM

## 2020-11-08 NOTE — Progress Notes (Signed)
Chief Complaint  Patient presents with  . Follow-up    "just a follow up, no problems" Room 1, son Caryn Bee in room     HISTORY OF PRESENT ILLNESS: 11/08/20  Rika D Pongratz is a 70 y.o. female here today for follow up for RRMS. She continues Vumerity. Last CT head 02/2019 was stable.   She feels symptoms are fairly stable. No new or exacerbating symptoms. She is able to transfer in and out of wheelchair. She does not walk much. She does have a walker in her room if needed.   Trigeminal neuralgia controlled on carbamazepine and hydrocodone, prescribed by PCP. Mood is good. She uses Valium 5mg  up to BID as needed for anxiety.     HISTORY (copied from previous note)  Miryah Goodloe is a 69 year old woman with multiple sclerosis.  Update 05/08/2020: Her MS has been stable.   She is in [place] in the Assisted Living section.   She is able to transfer in and out of her wheelchair and slef propels in a manual wheelchair mostly.  She uses a [place] and can some as well.    She tries to exercise some.    She gets her meals in the cafeteria.    Her right leg is slightly weaker than the left.  She has some spasticity in the legs.  She has some lower back pain.   Trigeminal neuralgia has done well with Tegretol and prn hydrocodone.   She has urinary urgency.  She feels worse with weather changes.     She is sleeping well most nights.    She takes night time medications. at 8 pm and goes to sleep a little later.   She gets woken up around 5 am.   Mood is doing well.   No new cognitive issues.  She continues to note some reduced focus/attention.  She has friends in the assisted living.     She has had the covid-19 vaccination.     MS History:   She had the onset of right sided facial numbness now pregnant during the 1970s. That numbness resolved over the next month or so. However, in the mid 1990s she began to experience pain in the same distribution. She had an MRI of the brain performed by  Dr. 08-25-1975 in 1998. It was consistent with multiple sclerosis.  She had an LP in the 1970's and the CSF was not significantly abnormal to diagnose MS.   She has been on Rebif since 2004.  She had an exacerbation of March 2018 with worsening gait, left visual changes. She also had facial weakness that could be a Bell's palsy.   She switch to Tecfidera May 2018 due to the exacerbation and new activity on the MRI.  MRI 01/15/2017 shows multiple T2/FLAIR hyperintense foci in the periventricular, juxtacortical and deep white matter and also in the pons and right middle cerebral peduncle.  None of the foci enhance.     REVIEW OF SYSTEMS: Out of a complete 14 system review of symptoms, the patient complains only of the following symptoms, lower extremity weakness and all other reviewed systems are negative.    ALLERGIES: Allergies  Allergen Reactions  . Ciprofloxacin Other (See Comments)    Dizziness and headache  . Codeine Other (See Comments)    GI upset  . Erythromycin Other (See Comments)    Unknown     HOME MEDICATIONS: Outpatient Medications Prior to Visit  Medication Sig Dispense Refill  . albuterol (PROVENTIL  HFA;VENTOLIN HFA) 108 (90 BASE) MCG/ACT inhaler Inhale 2 puffs into the lungs every 6 (six) hours as needed for wheezing. 1 Inhaler 6  . aspirin 81 MG tablet Take 81 mg by mouth daily.    Marland Kitchen atorvastatin (LIPITOR) 10 MG tablet Take 10 mg by mouth daily.    . carbamazepine (TEGRETOL XR) 200 MG 12 hr tablet Take 1 tablet (200 mg total) by mouth 3 (three) times daily. 90 tablet 0  . Cholecalciferol (VITAMIN D PO) Take 1,000 Units by mouth daily.     . cycloSPORINE (RESTASIS) 0.05 % ophthalmic emulsion Place 1 drop into both eyes 2 (two) times daily.    . diazepam (VALIUM) 5 MG tablet Take 1 tablet (5 mg total) by mouth every 12 (twelve) hours as needed for anxiety. 10 tablet 0  . Diroximel Fumarate (VUMERITY) 231 MG CPDR Take 2 capsules by mouth 2 (two) times daily. 120 capsule 11   . feeding supplement, ENSURE ENLIVE, (ENSURE ENLIVE) LIQD Take 237 mLs by mouth 2 (two) times daily between meals. 237 mL 12  . fluticasone (FLONASE) 50 MCG/ACT nasal spray SPRAY 2 SPRAYS INTO EACH NOSTRIL EVERY DAY (Patient taking differently: Place 2 sprays into both nostrils daily as needed for allergies.) 16 g 3  . HYDROcodone-acetaminophen (NORCO) 7.5-325 MG tablet Take 1 tablet by mouth every 8 (eight) hours as needed for moderate pain. 15 tablet 0  . hydrocortisone cream 1 % Apply 1 application topically 2 (two) times daily.    . hydrOXYzine (ATARAX/VISTARIL) 25 MG tablet Take 25 mg by mouth 3 (three) times daily as needed.    Marland Kitchen ibuprofen (ADVIL) 400 MG tablet Take 1 tablet (400 mg total) by mouth every 6 (six) hours as needed for fever, headache or mild pain. 30 tablet 0  . ipratropium (ATROVENT) 0.06 % nasal spray Place 1 spray into the nose daily as needed for rhinitis.     Marland Kitchen levothyroxine (SYNTHROID, LEVOTHROID) 50 MCG tablet Take 1 tablet daily before breakfast (Patient taking differently: Take 50 mcg by mouth daily.) 90 tablet 1  . Menthol-Methyl Salicylate (MUSCLE RUB) 10-15 % CREA Apply 1 application topically as needed for muscle pain.  0  . metoprolol succinate (TOPROL-XL) 25 MG 24 hr tablet TAKE 1 TABLET BY MOUTH EVERY DAY (Patient taking differently: Take 25 mg by mouth daily.) 90 tablet 3  . mirtazapine (REMERON) 7.5 MG tablet Take 7.5 mg by mouth at bedtime.    . Multiple Vitamin (MULTIVITAMIN) tablet Take 1 tablet by mouth daily.    Marland Kitchen triamcinolone cream (KENALOG) 0.1 % Apply 1 application topically 2 (two) times daily.     No facility-administered medications prior to visit.     PAST MEDICAL HISTORY: Past Medical History:  Diagnosis Date  . Hypertension   . MS (multiple sclerosis) (HCC)   . Osteopenia 05/2014   T score -1.8 FRAX 16%/4.7%  . Thyroid disease    Hypothyroid  . Trigeminal neuralgia      PAST SURGICAL HISTORY: Past Surgical History:  Procedure  Laterality Date  . BACK SURGERY  2011  . ELBOW SURGERY  1999   shattered elbow  . feet surgery  1990  . gamma knife  2005  . radiofrquency rhizotomy  [id number]  . underarm surgery  1988     FAMILY HISTORY: Family History  Problem Relation Age of Onset  . Heart disease Mother   . Hypertension Father   . Cancer Father        colon  .  Stroke Father   . Breast cancer Maternal Aunt 50  . Breast cancer Cousin 40     SOCIAL HISTORY: Social History   Socioeconomic History  . Marital status: Married    Spouse name: Not on file  . Number of children: 2  . Years of education: college  . Highest education level: Not on file  Occupational History    Employer: OTHER  Tobacco Use  . Smoking status: Current Every Day Smoker    Packs/day: 1.00    Types: Cigarettes  . Smokeless tobacco: Never Used  Substance and Sexual Activity  . Alcohol use: Yes    Alcohol/week: 0.0 standard drinks    Comment: rarely  . Drug use: No  . Sexual activity: Yes    Birth control/protection: Post-menopausal    Comment: 1st intercourse 70 yo-Fewer than 5 partners  Other Topics Concern  . Not on file  Social History Narrative   Patient lives in [facility]   Patient is right handed   Caffeine consumption is 3 cups a day   Social Determinants of Health   Financial Resource Strain: Not on file  Food Insecurity: Not on file  Transportation Needs: Not on file  Physical Activity: Not on file  Stress: Not on file  Social Connections: Not on file  Intimate Partner Violence: Not on file      PHYSICAL EXAM  Vitals:   11/08/20 1505  BP: (!) 157/95  Pulse: 77  Weight: 135 lb (61.2 kg)  Height: 5' 5.5" (1.664 m)   Body mass index is 22.12 kg/m.   Generalized: Well developed, in no acute distress  Cardiology: normal rate and rhythm, no murmur auscultated  Respiratory: clear to auscultation bilaterally    Neurological examination  Mentation: Alert oriented to  time, place, history taking. Follows all commands speech and language fluent Cranial nerve II-XII: Pupils were equal round reactive to light. Extraocular movements were full, visual field were full on confrontational test. Facial sensation and strength were normal. Head turning and shoulder shrug  were normal and symmetric. Motor: The motor testing reveals 5 over 5 strength of all 4 extremities. Good symmetric motor tone is noted throughout.  Sensory: Sensory testing is intact to soft touch on all 4 extremities. No evidence of extinction is noted.  Coordination: Cerebellar testing reveals good finger-nose-finger and heel-to-shin bilaterally.  Gait and station: Gait is unsteady, only took a couple of steps today, did not feel comfortable walking. Tandem not attempted.  Reflexes: Deep tendon reflexes are brisk but symmetric bilaterally.     DIAGNOSTIC DATA (LABS, IMAGING, TESTING) - I reviewed patient records, labs, notes, testing and imaging myself where available.  Lab Results  Component Value Date   WBC 10.2 05/08/2020   HGB 13.1 05/08/2020   HCT 38.7 05/08/2020   MCV 91 05/08/2020   PLT 353 05/08/2020      Component Value Date/Time   NA 136 05/08/2020 1646   K 4.5 05/08/2020 1646   CL 100 05/08/2020 1646   CO2 23 05/08/2020 1646   GLUCOSE 101 (H) 05/08/2020 1646   GLUCOSE 97 02/17/2019 0306   BUN 16 05/08/2020 1646   CREATININE 0.77 05/08/2020 1646   CREATININE 0.69 05/17/2018 1125   CALCIUM 9.4 05/08/2020 1646   PROT 7.8 05/08/2020 1646   ALBUMIN 4.4 05/08/2020 1646   AST 17 05/08/2020 1646   ALT 17 05/08/2020 1646   ALKPHOS 123 (H) 05/08/2020 1646   BILITOT <0.2 05/08/2020 1646  GFRNONAA 79 05/08/2020 1646   GFRNONAA 77 01/22/2017 1249   GFRAA 91 05/08/2020 1646   GFRAA 89 01/22/2017 1249   Lab Results  Component Value Date   CHOL 212 (H) 07/31/2015   HDL 75 07/31/2015   LDLCALC 97 07/31/2015   TRIG 201 (H) 07/31/2015   CHOLHDL 2.8 07/31/2015   Lab Results   Component Value Date   HGBA1C 4.9 02/12/2019   Lab Results  Component Value Date   VITAMINB12 849 02/14/2019   Lab Results  Component Value Date   TSH 4.762 (H) 02/11/2019    No flowsheet data found.   No flowsheet data found.   ASSESSMENT AND PLAN  70 y.o. year old female  has a past medical history of Hypertension, MS (multiple sclerosis) (HCC), Osteopenia (05/2014), Thyroid disease, and Trigeminal neuralgia. here with   MS (multiple sclerosis) (HCC) - Plan: CBC with Differential/Platelets, CMP, Carbamazepine level, total  High risk medication use - Plan: CBC with Differential/Platelets, CMP, Carbamazepine level, total  Trigeminal neuralgia  Abnormality of gait  Kymia continues to do very well.  No new or exacerbating symptoms.  She will continue Vumerity twice daily every day.  She request that I make note that it is better to take medication every 12 hours.  Medication is administered through assisted living.  She will continue carbamazepine for trigeminal neuralgia.  She is taking hydrocodone every day prescribed through her primary care.  She is tolerating this well.  We will update labs today.  I have encouraged her to continue regular physical activity.  She has great strength in the office today.  I have encouraged her to try to stand up and walk with assistance when she feels stable.  She will continue close follow-up with primary care.  I will have her follow-up in 6 months with Dr. Epimenio Foot, sooner if needed.  She verbalizes understanding and agreement with this plan.   Orders Placed This Encounter  Procedures  . CBC with Differential/Platelets  . CMP  . Carbamazepine level, total     No orders of the defined types were placed in this encounter.     I spent 20 minutes of face-to-face and non-face-to-face time with patient.  This included previsit chart review, lab review, study review, order entry, electronic health record documentation, patient  education.    Shawnie Dapper, MSN, FNP-C 11/08/2020, 4:43 PM  [facility] [address] [place], [place] [address] [phone]

## 2020-11-08 NOTE — Patient Instructions (Addendum)
Below is our plan:  We will continue current treatment plan. Please continue Vumerity every 12 hours. Medication works best if given consistently each day (ie. 7am and 7pm every day).   Please make sure you are staying well hydrated. I recommend 50-60 ounces daily. Well balanced diet and regular exercise encouraged.    Please continue follow up with care team as directed.   Follow up with [name] in 6 months   You may receive a survey regarding today's visit. I encourage you to leave honest feed back as I do use this information to improve patient care. Thank you for seeing me today!      Multiple Sclerosis Multiple sclerosis (MS) is a disease of the brain, spinal cord, and optic nerves (central nervous system). It causes the body's disease-fighting (immune) system to destroy the protective covering (myelin sheath) around nerves in the brain. When this happens, signals (nerve impulses) going to and from the brain and spinal cord do not get sent properly or may not get sent at all. There are several types of MS:  Relapsing-remitting MS. This is the most common type. This causes sudden attacks of symptoms. After an attack, you may recover completely until the next attack, or some symptoms may remain permanently.  Secondary progressive MS. This usually develops after the onset of relapsing-remitting MS. Similar to relapsing-remitting MS, this type also causes sudden attacks of symptoms. Attacks may be less frequent, but symptoms slowly get worse (progress) over time.  Primary progressive MS. This causes symptoms that steadily progress over time. This type of MS does not cause sudden attacks of symptoms. The age of onset of MS varies, but it often develops between 15-26 years of age. MS is a lifelong (chronic) condition. There is no cure, but treatment can help slow down the progression of the disease. What are the causes? The cause of this condition is not known. What increases the  risk? You are more likely to develop this condition if:  You are a woman.  You have a relative with MS. However, the condition is not passed from parent to child (inherited).  You have a lack (deficiency) of vitamin D.  You smoke. MS is more common in the [place] than in the [place]. What are the signs or symptoms? Relapsing-remitting and secondary progressive MS cause symptoms to occur in episodes or attacks that may last weeks to months. There may be long periods between attacks in which there are almost no symptoms. Primary progressive MS causes symptoms to steadily progress after they develop. Symptoms of MS vary because of the many different ways it affects the central nervous system. The main symptoms include:  Vision problems and eye pain.  Numbness and weakness.  Inability to move your arms, hands, feet, or legs (paralysis).  Balance problems.  Shaking that you cannot control (tremors).  Muscle spasms.  Problems with thinking (cognitive changes). MS can also cause symptoms that are associated with the disease, but are not always the direct result of an MS attack. They may include:  Inability to control urination or bowel movements (incontinence).  Headaches.  Fatigue.  Inability to tolerate heat.  Emotional changes.  Depression.  Pain. How is this diagnosed? This condition is diagnosed based on:  Your symptoms.  A neurological exam. This involves checking central nervous system function, such as nerve function, reflexes, and coordination.  MRIs of the brain and spinal cord.  Lab tests, including a lumbar puncture that tests the  fluid that surrounds the brain and spinal cord (cerebrospinal fluid).  Tests to measure the electrical activity of the brain in response to stimulation (evoked potentials). How is this treated? There is no cure for MS, but medicines can help decrease the number and frequency of attacks and help  relieve nuisance symptoms. Treatment options may include:  Medicines that reduce the frequency of attacks. These medicines may be given by injection, by mouth (orally), or through an IV.  Medicines that reduce inflammation (steroids). These may provide short-term relief of symptoms.  Medicines to help control pain, depression, fatigue, or incontinence.  Nutritional counseling. Vitamin D supplements, if you have a deficiency.  Using devices to help you move around (assistive devices), such as braces, a cane, or a walker.  Physical therapy to strengthen and stretch your muscles.  Occupational therapy to help you with everyday tasks.  Alternative or complementary treatments such as exercise, massage, or acupuncture.   Follow these instructions at home:  Take over-the-counter and prescription medicines only as told by your health care provider.  Do not drive or use heavy machinery while taking prescription pain medicine.  Use assistive devices as recommended by your physical therapist or your health care provider.  Exercise as directed by your health care provider.  Eating healthy can help manage MS symptoms.  Return to your normal activities as told by your health care provider. Ask your health care provider what activities are safe for you.  Reach out for support. Share your feelings with friends, family, or a support group.  Keep all follow-up visits as told by your health care provider and therapists. This is important. Where to find more information  National Multiple Sclerosis Society: https://www.nationalmssociety.org  [employer] of Neurological Disorders and Stroke: https://johnson-smith.net/  [employer] for Complementary and Integrative Health: http://miller-hamilton.net/ Contact a health care provider if:  You feel depressed.  You develop new pain or numbness.  You have tremors.  You have problems with sexual function. Get help right away if:  You  develop paralysis.  You develop numbness.  You have problems with your bladder or bowel function.  You develop double vision.  You lose vision in one or both eyes.  You develop suicidal thoughts.  You develop severe confusion. If you ever feel like you may hurt yourself or others, or have thoughts about taking your own life, get help right away. You can go to your nearest emergency department or call:  Your local emergency services (911 in the [place]  A suicide crisis helpline, such as the National Suicide Prevention Lifeline at 732-518-8829. This is open 24 hours a day. Summary  Multiple sclerosis (MS) is a disease of the central nervous system that causes the body's immune system to destroy the protective covering (myelin sheath) around nerves in the brain.  There are 3 types of MS: relapsing-remitting, secondary progressive, and primary progressive. Relapsing-remitting and secondary progressive MS cause symptoms to occur in episodes or attacks that may last weeks to months. Primary progressive MS causes symptoms to steadily progress after they develop.  There is no cure for MS, but medicines can help decrease the number and frequency of attacks and help relieve nuisance symptoms. Treatment may also include physical or occupational therapy.  If you develop numbness, paralysis, vision problems, or other neurological symptoms, get help right away. This information is not intended to replace advice given to you by your health care provider. Make sure you discuss any questions you have with your health care provider.  Document Revised: 07/03/2020 Document Reviewed: 07/03/2020 Elsevier Patient Education  2021 [employer].

## 2020-11-08 NOTE — Progress Notes (Signed)
I have read the note, and I agree with the clinical assessment and plan.  Lenola Lockner A. Thomas Mabry, MD, PhD, FAAN Certified in Neurology, Clinical Neurophysiology, Sleep Medicine, Pain Medicine and Neuroimaging  [facility] [address], [address] [place], [place] [address] [phone]  

## 2020-11-09 LAB — COMPREHENSIVE METABOLIC PANEL
ALT: 14 IU/L (ref 0–32)
AST: 18 IU/L (ref 0–40)
Albumin/Globulin Ratio: 1.5 (ref 1.2–2.2)
Albumin: 4.8 g/dL (ref 3.8–4.8)
Alkaline Phosphatase: 108 IU/L (ref 44–121)
BUN/Creatinine Ratio: 21 (ref 12–28)
BUN: 18 mg/dL (ref 8–27)
Bilirubin Total: <0.2 mg/dL (ref 0.0–1.2)
CO2: 23 mmol/L (ref 20–29)
Calcium: 9.8 mg/dL (ref 8.7–10.3)
Chloride: 99 mmol/L (ref 96–106)
Creatinine, Ser: 0.86 mg/dL (ref 0.57–1.00)
GFR calc Af Amer: 80 mL/min/{1.73_m2} (ref >59)
GFR calc non Af Amer: 69 mL/min/{1.73_m2} (ref >59)
Globulin, Total: 3.2 g/dL (ref 1.5–4.5)
Glucose: 96 mg/dL (ref 65–99)
Potassium: 5.1 mmol/L (ref 3.5–5.2)
Sodium: 138 mmol/L (ref 134–144)
Total Protein: 8 g/dL (ref 6.0–8.5)

## 2020-11-09 LAB — CBC WITH DIFFERENTIAL/PLATELET
Basophils Absolute: 0.1 10*3/uL (ref 0.0–0.2)
Basos: 1 %
EOS (ABSOLUTE): 0.2 10*3/uL (ref 0.0–0.4)
Eos: 3 %
Hematocrit: 39.6 % (ref 34.0–46.6)
Hemoglobin: 13.3 g/dL (ref 11.1–15.9)
Immature Grans (Abs): 0 10*3/uL (ref 0.0–0.1)
Immature Granulocytes: 0 %
Lymphocytes Absolute: 1.5 10*3/uL (ref 0.7–3.1)
Lymphs: 21 %
MCH: 29.8 pg (ref 26.6–33.0)
MCHC: 33.6 g/dL (ref 31.5–35.7)
MCV: 89 fL (ref 79–97)
Monocytes Absolute: 0.8 10*3/uL (ref 0.1–0.9)
Monocytes: 11 %
Neutrophils Absolute: 4.5 10*3/uL (ref 1.4–7.0)
Neutrophils: 64 %
Platelets: 307 10*3/uL (ref 150–450)
RBC: 4.46 x10E6/uL (ref 3.77–5.28)
RDW: 12.8 % (ref 11.7–15.4)
WBC: 7.1 10*3/uL (ref 3.4–10.8)

## 2020-11-09 LAB — CARBAMAZEPINE LEVEL, TOTAL: Carbamazepine (Tegretol), S: 8.5 ug/mL (ref 4.0–12.0)

## 2020-11-14 ENCOUNTER — Telehealth: Payer: Self-pay

## 2020-11-14 NOTE — Telephone Encounter (Signed)
Attempted to call pt, LVM for results

## 2020-11-14 NOTE — Telephone Encounter (Signed)
Pt verified by name and DOB,  normal results given to son per provider, son voiced understanding all question answered.

## 2020-12-14 ENCOUNTER — Encounter (HOSPITAL_COMMUNITY): Payer: Self-pay

## 2020-12-14 ENCOUNTER — Emergency Department (HOSPITAL_COMMUNITY): Payer: Medicare Other

## 2020-12-14 ENCOUNTER — Other Ambulatory Visit: Payer: Self-pay

## 2020-12-14 ENCOUNTER — Telehealth: Payer: Self-pay | Admitting: Family Medicine

## 2020-12-14 ENCOUNTER — Emergency Department (HOSPITAL_COMMUNITY)
Admission: EM | Admit: 2020-12-14 | Discharge: 2020-12-15 | Disposition: A | Discharge status: Home / Self Care | Payer: Medicare Other | Attending: Emergency Medicine | Admitting: Emergency Medicine

## 2020-12-14 DIAGNOSIS — I10 Essential (primary) hypertension: Secondary | ICD-10-CM | POA: Insufficient documentation

## 2020-12-14 DIAGNOSIS — G500 Trigeminal neuralgia: Secondary | ICD-10-CM | POA: Diagnosis not present

## 2020-12-14 DIAGNOSIS — F17210 Nicotine dependence, cigarettes, uncomplicated: Secondary | ICD-10-CM | POA: Insufficient documentation

## 2020-12-14 DIAGNOSIS — E039 Hypothyroidism, unspecified: Secondary | ICD-10-CM | POA: Diagnosis not present

## 2020-12-14 DIAGNOSIS — Z79899 Other long term (current) drug therapy: Secondary | ICD-10-CM | POA: Diagnosis not present

## 2020-12-14 DIAGNOSIS — G35 Multiple sclerosis: Secondary | ICD-10-CM | POA: Diagnosis present

## 2020-12-14 DIAGNOSIS — Z7982 Long term (current) use of aspirin: Secondary | ICD-10-CM | POA: Insufficient documentation

## 2020-12-14 LAB — BASIC METABOLIC PANEL
Anion gap: 12 (ref 5–15)
BUN: 11 mg/dL (ref 8–23)
CO2: 21 mmol/L — ABNORMAL LOW (ref 22–32)
Calcium: 9.4 mg/dL (ref 8.9–10.3)
Chloride: 104 mmol/L (ref 98–111)
Creatinine, Ser: 0.74 mg/dL (ref 0.44–1.00)
GFR, Estimated: >60 mL/min (ref >60)
Glucose, Bld: 95 mg/dL (ref 70–99)
Potassium: 3.3 mmol/L — ABNORMAL LOW (ref 3.5–5.1)
Sodium: 137 mmol/L (ref 135–145)

## 2020-12-14 LAB — CBC WITH DIFFERENTIAL/PLATELET
Abs Immature Granulocytes: 0.06 K/uL (ref 0.00–0.07)
Basophils Absolute: 0.1 K/uL (ref 0.0–0.1)
Basophils Relative: 1 %
Eosinophils Absolute: 0.1 K/uL (ref 0.0–0.5)
Eosinophils Relative: 2 %
HCT: 38.1 % (ref 36.0–46.0)
Hemoglobin: 12.5 g/dL (ref 12.0–15.0)
Immature Granulocytes: 1 %
Lymphocytes Relative: 15 %
Lymphs Abs: 1.2 K/uL (ref 0.7–4.0)
MCH: 29.9 pg (ref 26.0–34.0)
MCHC: 32.8 g/dL (ref 30.0–36.0)
MCV: 91.1 fL (ref 80.0–100.0)
Monocytes Absolute: 0.8 K/uL (ref 0.1–1.0)
Monocytes Relative: 10 %
Neutro Abs: 5.8 K/uL (ref 1.7–7.7)
Neutrophils Relative %: 71 %
Platelets: 247 K/uL (ref 150–400)
RBC: 4.18 MIL/uL (ref 3.87–5.11)
RDW: 12.8 % (ref 11.5–15.5)
WBC: 8.1 K/uL (ref 4.0–10.5)
nRBC: 0 % (ref 0.0–0.2)

## 2020-12-14 LAB — CARBAMAZEPINE LEVEL, TOTAL: Carbamazepine Lvl: 6.4 ug/mL (ref 4.0–12.0)

## 2020-12-14 MED ORDER — LORAZEPAM 2 MG/ML IJ SOLN
1.0000 mg | Freq: Once | INTRAMUSCULAR | Status: AC
  Administered 2020-12-14: 1 mg via INTRAVENOUS
  Filled 2020-12-14: qty 1

## 2020-12-14 MED ORDER — MORPHINE SULFATE (PF) 4 MG/ML IV SOLN
4.0000 mg | Freq: Once | INTRAVENOUS | Status: DC
Start: 2020-12-14 — End: 2020-12-15

## 2020-12-14 MED ORDER — GADOBUTROL 1 MMOL/ML IV SOLN
6.0000 mL | Freq: Once | INTRAVENOUS | Status: AC | PRN
  Administered 2020-12-14: 6 mL via INTRAVENOUS

## 2020-12-14 MED ORDER — SODIUM CHLORIDE 0.9 % IV SOLN
500.0000 mg | Freq: Once | INTRAVENOUS | Status: AC
  Administered 2020-12-14: 500 mg via INTRAVENOUS
  Filled 2020-12-14: qty 10

## 2020-12-14 NOTE — ED Provider Notes (Signed)
70 year old female presents to the ED due to worsening right-sided, shooting facial pain that has worsened over the past few days. Patient has a history of MS and trigeminal neuralgia and is currently on Vumerity for MS and carbamazepine and hydrocone for trigeminal neuralgia. Patient is followed by Dr. Epimenio Foot with neurology. Last visit 11/08/2020. Patient notes this is typical for a MS flare. No other complaints. Denies weakness, speech changes, visual changes, and dizziness.   Physical exam reassuring. Normal neurological exam. No tenderness with palpation of right side of face. EOMs intact. No weakness on exam.   MSE was initiated and I personally evaluated the patient and placed orders (if any) at  2:35 PM on December 14, 2020.  CBC and BMP ordered. Paged Dr. Bonnita Hollow office; however, received phone call requesting [place] to page inpatient neurology for further recommendations. Discussed case with Dr. Iver Nestle with neurology who recommends MRI brain with and without contrast with thin cuts through brainstem. Order placed.   The patient appears stable so that the remainder of the MSE may be completed by another provider.   Mannie Stabile, PA-C 12/14/20 1509    Arby Barrette, MD 12/14/20 [phone]

## 2020-12-14 NOTE — Discharge Instructions (Addendum)
Follow up with Dr. Epimenio Foot.   Take Care,   Dr. Rachael Darby

## 2020-12-14 NOTE — ED Notes (Signed)
Called [facility] for patient to go back to [facility]. They state that it will be a while, there are 15 people ahead of her.

## 2020-12-14 NOTE — Telephone Encounter (Signed)
[id number] [place]) called, believe patient is having a MS flare up. Administerd medication at 7:15a. Patient is still in pain. Want to send patient to ER for evaluation. Patient refuse to go unless neurologist tells her go to the ER. Spoke with patient's sons and they approve of her going to ER.  Contact info: [phone]

## 2020-12-14 NOTE — ED Triage Notes (Signed)
Pt from [facility] with ems for MS flare up, reports pain to the right side of her face. Pt a.o, complaint with medications

## 2020-12-14 NOTE — ED Notes (Signed)
Pt to MRI

## 2020-12-14 NOTE — Telephone Encounter (Signed)
[facility] nurse stating the patient is in pain and screaming, twitching, butot confused- they agreed she needs to be seen in ED and that this may not be an MS related episode at all- CD Son was also informed of these steps.

## 2020-12-14 NOTE — ED Provider Notes (Signed)
[facility] EMERGENCY DEPARTMENT Provider Note   CSN: [id number] Arrival date & time: 12/14/20  1123     History Chief Complaint  Patient presents with  . Multiple Sclerosis    Melanie Richardson is a 70 y.o. female.   HPI  Pt has hx of relapsing and remitting MS and trigeminal neuralgia who presented to the ED after 2-3 weeks of sharp pains radiating through the right side of her face. She states she feels like she is having an MS flare. Pain has been worsening over the last few days as the shocks are coming more frequently. She follows with Dr. Epimenio Foot at [facility] and was last seen in early February. Denies blurry vision or vision changes, headache, extremity weakness, recent illness. Has a rash on right chest and back. Uses a wheelchair at home. Lives at [place].        Past Medical History:  Diagnosis Date  . Hypertension   . MS (multiple sclerosis) (HCC)   . Osteopenia 05/2014   T score -1.8 FRAX 16%/4.7%  . Thyroid disease    Hypothyroid  . Trigeminal neuralgia     Patient Active Problem List   Diagnosis Date Noted  . Somnolence   . Normocytic anemia 02/12/2019  . UTI (urinary tract infection) 02/11/2019  . Hypokalemia 02/11/2019  . Hyperphosphatemia 02/11/2019  . Abnormal LFTs 02/11/2019  . Leukocytosis 02/11/2019  . Hyperglycemia 02/11/2019  . Dehydration 02/10/2019  . 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 High risk medication use 02/04/2017  . Right-sided Bell's palsy 12/29/2016  . Allergic rhinitis 08/07/2015  . Hypertension 08/06/2015  . Hypertriglyceridemia 08/06/2015  . Anxiety state 07/25/2015  . Dysesthesia 10/26/2014  . Abnormality of gait 10/26/2014  . Weakness 10/26/2014  . Sinusitis, acute 07/17/2013  . Wheezing 07/17/2013  . Hypothyroidism 07/17/2013  . MS (multiple sclerosis) (HCC)   . Trigeminal neuralgia     Past Surgical History:  Procedure Laterality Date  . BACK SURGERY   2011  . ELBOW SURGERY  1999   shattered elbow  . feet surgery  1990  . gamma knife  2005  . radiofrquency rhizotomy  [id number]  . underarm surgery  1988     OB History    Gravida  2   Para  2   Term      Preterm      AB      Living  2     SAB      IAB      Ectopic      Multiple      Live Births              Family History  Problem Relation Age of Onset  . Heart disease Mother   . Hypertension Father   . Cancer Father        colon  . Stroke Father   . Breast cancer Maternal Aunt 50  . Breast cancer Cousin 50    Social History   Tobacco Use  . Smoking status: Current Every Day Smoker    Packs/day: 1.00    Types: Cigarettes  . Smokeless tobacco: Never Used  Substance Use Topics  . Alcohol use: Yes    Alcohol/week: 0.0 standard drinks    Comment: rarely  . Drug use: No    Home Medications Prior to Admission medications   Medication Sig Start Date End Date Taking? Authorizing Provider  albuterol (PROVENTIL HFA;VENTOLIN HFA) 108 (90 BASE) MCG/ACT inhaler  Inhale 2 puffs into the lungs every 6 (six) hours as needed for wheezing. 08/06/15   Salley Scarlet, MD  aspirin 81 MG tablet Take 81 mg by mouth daily.    [provider]  atorvastatin (LIPITOR) 10 MG tablet Take 10 mg by mouth daily.    [provider]  carbamazepine (TEGRETOL XR) 200 MG 12 hr tablet Take 1 tablet (200 mg total) by mouth 3 (three) times daily. 02/19/19   Rodolph Bong, MD  Cholecalciferol (VITAMIN D PO) Take 1,000 Units by mouth daily.     [provider]  cycloSPORINE (RESTASIS) 0.05 % ophthalmic emulsion Place 1 drop into both eyes 2 (two) times daily.    [provider]  diazepam (VALIUM) 5 MG tablet Take 1 tablet (5 mg total) by mouth every 12 (twelve) hours as needed for anxiety. 02/19/19   Rodolph Bong, MD  Diroximel Fumarate (VUMERITY) 231 MG CPDR Take 2 capsules by mouth 2 (two) times daily. 11/07/20   Sater, Pearletha Furl, MD   feeding supplement, ENSURE ENLIVE, (ENSURE ENLIVE) LIQD Take 237 mLs by mouth 2 (two) times daily between meals. 02/19/19   Rodolph Bong, MD  fluticasone (FLONASE) 50 MCG/ACT nasal spray SPRAY 2 SPRAYS INTO EACH NOSTRIL EVERY DAY Patient taking differently: Place 2 sprays into both nostrils daily as needed for allergies. 06/25/18   Wildwood, Velna Hatchet, MD  HYDROcodone-acetaminophen (NORCO) 7.5-325 MG tablet Take 1 tablet by mouth every 8 (eight) hours as needed for moderate pain. 02/19/19   Rodolph Bong, MD  hydrocortisone cream 1 % Apply 1 application topically 2 (two) times daily.    [provider]  hydrOXYzine (ATARAX/VISTARIL) 25 MG tablet Take 25 mg by mouth 3 (three) times daily as needed.    [provider]  ibuprofen (ADVIL) 400 MG tablet Take 1 tablet (400 mg total) by mouth every 6 (six) hours as needed for fever, headache or mild pain. 02/19/19   Rodolph Bong, MD  ipratropium (ATROVENT) 0.06 % nasal spray Place 1 spray into the nose daily as needed for rhinitis.  12/02/16   [provider]  levothyroxine (SYNTHROID, LEVOTHROID) 50 MCG tablet Take 1 tablet daily before breakfast Patient taking differently: Take 50 mcg by mouth daily. 11/15/18   Sater, Pearletha Furl, MD  Menthol-Methyl Salicylate (MUSCLE RUB) 10-15 % CREA Apply 1 application topically as needed for muscle pain. 02/19/19   Rodolph Bong, MD  metoprolol succinate (TOPROL-XL) 25 MG 24 hr tablet TAKE 1 TABLET BY MOUTH EVERY DAY Patient taking differently: Take 25 mg by mouth daily. 05/26/18   Salley Scarlet, MD  mirtazapine (REMERON) 7.5 MG tablet Take 7.5 mg by mouth at bedtime.    [provider]  Multiple Vitamin (MULTIVITAMIN) tablet Take 1 tablet by mouth daily.    [provider]  triamcinolone cream (KENALOG) 0.1 % Apply 1 application topically 2 (two) times daily.    [provider]    Allergies    Ciprofloxacin, Codeine, and Erythromycin  Review  of Systems   Review of Systems  Constitutional: Negative for fever.  HENT: Negative for congestion and sore throat.   Eyes: Negative for visual disturbance.  Respiratory: Positive for cough. Negative for shortness of breath.   Cardiovascular: Negative for chest pain.  Gastrointestinal: Negative for abdominal pain and vomiting.  Genitourinary: Negative for dysuria.  Musculoskeletal: Negative for back pain and neck pain.  Skin: Positive for rash.  Neurological: Negative for weakness and headaches.  Psychiatric/Behavioral: Negative for confusion.  All other systems reviewed and are negative.   Physical Exam Updated Vital Signs BP (!) 152/81   Pulse 92   Temp 98.3 F (36.8 C)   Resp 18   LMP 10/06/1996   SpO2 98%   Physical Exam Vitals and nursing note reviewed.  Constitutional:      Appearance: Normal appearance.  HENT:     Head: Normocephalic and atraumatic.     Nose: Nose normal. No rhinorrhea.  Eyes:     Extraocular Movements: Extraocular movements intact.     Conjunctiva/sclera: Conjunctivae normal.  Cardiovascular:     Rate and Rhythm: Normal rate and regular rhythm.     Pulses: Normal pulses.  Pulmonary:     Effort: Pulmonary effort is normal. No respiratory distress.  Abdominal:     Palpations: Abdomen is soft.     Tenderness: There is no abdominal tenderness. There is no guarding or rebound.  Musculoskeletal:     Comments: Walking cane present  Skin:    General: Skin is warm and dry.     Capillary Refill: Capillary refill takes less than 2 seconds.     Findings: Lesion (left ankle 3 abrasions) present.     Comments: Anterior right chest wall extending to right upper back rash (see images in media) c/w shingles rash  Neurological:     Mental Status: She is alert. Mental status is at baseline.     Comments: Alert, oriented to person and place, CN 2-12 grossly intact, strength 5/5 bilateral upper and lower extremities, gross sensation intact in all extremities,  having multiple intermittent twitches of her head during exam 2/2 right facial pain, good muscle tone, no abnormal coordination   Psychiatric:        Mood and Affect: Mood normal.        Behavior: Behavior normal.     ED Results / Procedures / Treatments   Labs (all labs ordered are listed, but only abnormal results are displayed) Labs Reviewed  BASIC METABOLIC PANEL - Abnormal; Notable for the following components:      Result Value   Potassium 3.3 (*)    CO2 21 (*)    All other components within normal limits  CBC WITH DIFFERENTIAL/PLATELET  CARBAMAZEPINE LEVEL, TOTAL    EKG None  Radiology MR Brain W and Wo Contrast  Result Date: 12/14/2020 CLINICAL DATA:  Multiple sclerosis.  Right-sided facial pain. EXAM: MRI HEAD WITHOUT AND WITH CONTRAST TECHNIQUE: Multiplanar, multiecho pulse sequences of the brain and surrounding structures were obtained without and with intravenous contrast. CONTRAST:  64mL GADAVIST GADOBUTROL 1 MMOL/ML IV SOLN COMPARISON:  Head CT 02/14/2019 and MRI 01/13/2017 FINDINGS: Due to patient confusion and inability to follow commands given by the technologist, the study is moderately motion degraded and incomplete with coronal T1 postcontrast and sagittal FLAIR sequences not obtained. Brain: There is no evidence of an acute infarct, intracranial hemorrhage, mass, midline shift, or extra-axial fluid collection. There is moderately advanced central predominant cerebral and cerebellar atrophy. T2 hyperintensities in the cerebral white matter bilaterally are similar to the prior MRI with greatest involvement of the periventricular white matter including lesions oriented perpendicularly to the lateral ventricles. There is also involvement of the corpus callosum which is atrophic, and there are unchanged T2 hyperintensities in the pons and right middle cerebellar peduncle. No abnormal enhancement is identified. Vascular: Major intracranial vascular flow voids are preserved.  Skull and upper cervical spine: Unremarkable bone marrow signal. Sinuses/Orbits: Paranasal sinuses and  mastoid air cells are clear. Other: None. IMPRESSION: 1. Motion degraded, incomplete examination. 2. Chronic white matter disease consistent with multiple sclerosis. No gross progression from 2018 or evidence of active demyelination or other acute intracranial abnormality. Electronically Signed   By: Sebastian Ache M.D.   On: 12/14/2020 20:55    Procedures Procedures   Medications Ordered in ED Medications  morphine 4 MG/ML injection 4 mg (has no administration in time range)  fosPHENYtoin (CEREBYX) 500 mg PE in sodium chloride 0.9 % 25 mL IVPB (has no administration in time range)  LORazepam (ATIVAN) injection 1 mg (1 mg Intravenous Given 12/14/20 1611)  LORazepam (ATIVAN) injection 1 mg (1 mg Intravenous Given 12/14/20 1808)  gadobutrol (GADAVIST) 1 MMOL/ML injection 6 mL (6 mLs Intravenous Contrast Given 12/14/20 2042)    ED Course  I have reviewed the triage vital signs and the nursing notes.  Pertinent labs & imaging results that were available during my care of the patient were reviewed by me and considered in my medical decision making (see chart for details).  6:13 PM  Patient denies pain related to Shingles rash. Pt waiting for MRI.     8:20 PM  Pt resting comfortably in bed.   10:45 PM  Spoke with Dr. Otelia Limes, neurologist, regarding pt's symptoms. He recommended 500 mg fosphenytoin given slowly. Pt to follow up outpatient.        MDM Rules/Calculators/A&P                          Pt is a 70 yo female with hx of relapsing remitting MS, trigeminal neurologia, HTN, hypothyroidism here for acute trigeminal episode. PA, [place] Citizen spoke with on call neurologist who recommended MR Brain. Carbamazepine level obtained. BMP with mild hypokalemia and CBC grossly unremarkable. Pt has shingles on right chest wall and back.   Neurology recommended fosphenytoin for trigeminal neuralgia.  Fosphenytoin ordered to given slowly. Plan for discharge and pt to follow up outpatient with her neurologist.    Final Clinical Impression(s) / ED Diagnoses Final diagnoses:  Trigeminal neuralgia of right side of face  MS (multiple sclerosis) [facility])    Rx / DC Orders ED Discharge Orders    None       Katha Cabal, DO 12/14/20 2253    Charlynne Pander, MD 12/17/20 1705

## 2020-12-15 DIAGNOSIS — G35 Multiple sclerosis: Secondary | ICD-10-CM | POA: Diagnosis not present

## 2020-12-15 NOTE — ED Notes (Signed)
[facility] at bedside to transport patient back to facility.  

## 2020-12-15 NOTE — ED Notes (Signed)
Patient resting eyes closed, respirations even and unlabored, no distress.

## 2020-12-15 NOTE — ED Notes (Signed)
RN attempted to call report to RN at [facility]. No answer, this RN left a voicemail requesting to be called back for report on their resident.

## 2020-12-17 ENCOUNTER — Telehealth: Payer: Self-pay

## 2020-12-17 NOTE — Telephone Encounter (Signed)
Looks like PCP as been managing her TN on carbamazepine and hydrocodone. If she is better, no follow up needed. If symptoms worsen, please let me know.

## 2020-12-17 NOTE — Telephone Encounter (Signed)
Transition Care Management Unsuccessful Follow-up Telephone Call  Date of discharge and from where:  12/15/2020 from   Attempts:  1st Attempt  Reason for unsuccessful TCM follow-up call:  Left voice message     

## 2020-12-18 NOTE — Telephone Encounter (Signed)
Pt son called back and stated that patient is living at [facility].

## 2021-04-19 ENCOUNTER — Other Ambulatory Visit: Payer: Self-pay | Admitting: Neurology

## 2021-04-19 DIAGNOSIS — G35 Multiple sclerosis: Secondary | ICD-10-CM

## 2021-04-22 NOTE — Telephone Encounter (Signed)
Vumerity RX sent to [facility].

## 2021-05-08 ENCOUNTER — Ambulatory Visit (INDEPENDENT_AMBULATORY_CARE_PROVIDER_SITE_OTHER): Payer: Medicare Other | Admitting: Neurology

## 2021-05-08 ENCOUNTER — Encounter: Payer: Self-pay | Admitting: Neurology

## 2021-05-08 VITALS — BP 175/98 | HR 62

## 2021-05-08 DIAGNOSIS — Z79899 Other long term (current) drug therapy: Secondary | ICD-10-CM | POA: Diagnosis not present

## 2021-05-08 DIAGNOSIS — G500 Trigeminal neuralgia: Secondary | ICD-10-CM | POA: Diagnosis not present

## 2021-05-08 DIAGNOSIS — G35 Multiple sclerosis: Secondary | ICD-10-CM

## 2021-05-08 DIAGNOSIS — R269 Unspecified abnormalities of gait and mobility: Secondary | ICD-10-CM

## 2021-05-08 NOTE — Progress Notes (Signed)
[facility]  PATIENT: Melanie Richardson DOB: [birth year]  REFERRING CLINICIAN: Kingsley Spittle [place] HISTORY FROM: Patient MS     REASON FOR VISIT: MS and trigeminal Neuralgia   HISTORICAL  CHIEF COMPLAINT:  Chief Complaint  Patient presents with   Follow-up    Pt with Melanie Richardson, son. Rm 2. Presents today for follow up.    HISTORY OF PRESENT ILLNESS:  Melanie Richardson is a 70 year old woman with multiple sclerosis.  Update 05/08/2021: Her MS has been stable. No exacerbations.   She is on Vumerity and tolerates it well.    She is in [place] in the Assisted Living section.     She has not been able to use a walker recently.  Her right leg is slightly weaker than the left.  She has some spasticity in the legs.  She has some lower back pain.     She  is able to transfer independently.   She self-propels in a manual wheelchair mostly.   She tries to exercise some.    She gets her meals in the cafeteria.    She has trigeminal neuralgia is doing well with just rare short flare ups.  It  has done well with Tegretol and prn hydrocodone.   She has urinary urgency.  She feels worse with weather changes.     She gets nausea at times, especially if hot.     She is sleeping well most nights.    Mood is doing well.   No new cognitive issues.  She continues to note some reduced focus/attention.  She has friends in the assisted living.   She tries to stay cognitively active.     MS History:   She had the onset of right sided facial numbness now pregnant during the 1970s. That numbness resolved over the next month or so. However, in the mid 1990s she began to experience pain in the same distribution. She had an MRI of the brain performed by Dr. Buzzy Han in 1998. It was consistent with multiple sclerosis.  She had an LP in the 1970's and the CSF was not significantly abnormal to diagnose MS.   She has been on Rebif since 2004.  She had an exacerbation of March 2018 with worsening gait, left  visual changes. She also had facial weakness that could be a Bell's palsy.   She switch to Tecfidera May 2018 due to the exacerbation and new activity on the MRI.  MRI 01/15/2017 shows multiple T2/FLAIR hyperintense foci in the periventricular, juxtacortical and deep white matter and also in the pons and right middle cerebral peduncle.  None of the foci enhance.   REVIEW OF SYSTEMS:  Constitutional: No fevers, chills, sweats, or change in appetite.   Some fatigue and insomnia Eyes: No visual changes, double vision, eye pain Ear, nose and throat: No hearing loss, ear pain, nasal congestion, sore throat Cardiovascular: No chest pain, palpitations Respiratory:  No shortness of breath at rest or with exertion.   No wheezes GastrointestinaI: No nausea, vomiting, diarrhea, abdominal pain, fecal incontinence Genitourinary:  No dysuria, urinary retention or frequency.  No nocturia. Musculoskeletal:  No neck pain, back pain Integumentary: No rash, pruritus, skin lesions Neurological: as above Psychiatric: No depression at this time.  No anxiety Endocrine: No palpitations, diaphoresis, change in appetite, change in weigh or increased thirst Hematologic/Lymphatic:  No anemia, purpura, petechiae. Allergic/Immunologic: No itchy/runny eyes, nasal congestion, recent allergic reactions, rashes  ALLERGIES: Allergies  Allergen Reactions   Ciprofloxacin Other (See Comments)  Dizziness and headache   Codeine Other (See Comments)    GI upset   Erythromycin Other (See Comments)    Unknown    HOME MEDICATIONS: Outpatient Medications Prior to Visit  Medication Sig Dispense Refill   albuterol (PROVENTIL HFA;VENTOLIN HFA) 108 (90 BASE) MCG/ACT inhaler Inhale 2 puffs into the lungs every 6 (six) hours as needed for wheezing. 1 Inhaler 6   aspirin 81 MG tablet Take 81 mg by mouth daily.     atorvastatin (LIPITOR) 10 MG tablet Take 10 mg by mouth daily.     busPIRone (BUSPAR) 10 MG tablet Take 10 mg by  mouth 2 (two) times daily.     carbamazepine (TEGRETOL XR) 200 MG 12 hr tablet Take 1 tablet (200 mg total) by mouth 3 (three) times daily. 90 tablet 0   Cholecalciferol (VITAMIN D PO) Take 1,000 Units by mouth daily.      cycloSPORINE (RESTASIS) 0.05 % ophthalmic emulsion Place 1 drop into both eyes 2 (two) times daily.     feeding supplement, ENSURE ENLIVE, (ENSURE ENLIVE) LIQD Take 237 mLs by mouth 2 (two) times daily between meals. 237 mL 12   fluticasone (FLONASE) 50 MCG/ACT nasal spray SPRAY 2 SPRAYS INTO EACH NOSTRIL EVERY DAY (Patient taking differently: Place 2 sprays into both nostrils daily as needed for allergies.) 16 g 3   HYDROcodone-acetaminophen (NORCO) 7.5-325 MG tablet Take 1 tablet by mouth every 8 (eight) hours as needed for moderate pain. 15 tablet 0   hydrocortisone cream 1 % Apply 1 application topically 2 (two) times daily.     hydrOXYzine (ATARAX/VISTARIL) 25 MG tablet Take 25 mg by mouth 3 (three) times daily as needed.     ibuprofen (ADVIL) 400 MG tablet Take 1 tablet (400 mg total) by mouth every 6 (six) hours as needed for fever, headache or mild pain. 30 tablet 0   ipratropium (ATROVENT) 0.06 % nasal spray Place 1 spray into the nose daily as needed for rhinitis.      levothyroxine (SYNTHROID) 75 MCG tablet Take 75 mcg by mouth daily before breakfast.     metoprolol succinate (TOPROL-XL) 25 MG 24 hr tablet TAKE 1 TABLET BY MOUTH EVERY DAY (Patient taking differently: Take 25 mg by mouth daily.) 90 tablet 3   mirtazapine (REMERON) 7.5 MG tablet Take 7.5 mg by mouth at bedtime.     Multiple Vitamin (MULTIVITAMIN) tablet Take 1 tablet by mouth daily.     VUMERITY 231 MG CPDR TAKE 2 CAPSULES (462MG ) BY MOUTH TWICE DAILY. 360 capsule 0   diazepam (VALIUM) 5 MG tablet Take 1 tablet (5 mg total) by mouth every 12 (twelve) hours as needed for anxiety. 10 tablet 0   levothyroxine (SYNTHROID, LEVOTHROID) 50 MCG tablet Take 1 tablet daily before breakfast (Patient taking  differently: No sig reported) 90 tablet 1   Menthol-Methyl Salicylate (MUSCLE RUB) 10-15 % CREA Apply 1 application topically as needed for muscle pain.  0   triamcinolone cream (KENALOG) 0.1 % Apply 1 application topically 2 (two) times daily.     No facility-administered medications prior to visit.    PAST MEDICAL HISTORY: Past Medical History:  Diagnosis Date   Hypertension    MS (multiple sclerosis) (HCC)    Osteopenia 05/2014   T score -1.8 FRAX 16%/4.7%   Thyroid disease    Hypothyroid   Trigeminal neuralgia     PAST SURGICAL HISTORY: Past Surgical History:  Procedure Laterality Date   BACK SURGERY  2011   ELBOW  SURGERY  1999   shattered elbow   feet surgery  1990   gamma knife  2005   radiofrquency rhizotomy  2002,2003   underarm surgery  1988    FAMILY HISTORY: Family History  Problem Relation Age of Onset   Heart disease Mother    Hypertension Father    Cancer Father        colon   Stroke Father    Breast cancer Maternal Aunt 16   Breast cancer Cousin 26    SOCIAL HISTORY:  Social History   Socioeconomic History   Marital status: Married    Spouse name: Not on file   Number of children: 2   Years of education: college   Highest education level: Not on file  Occupational History    Employer: OTHER  Tobacco Use   Smoking status: Every Day    Packs/day: 1.00    Types: Cigarettes   Smokeless tobacco: Never  Substance and Sexual Activity   Alcohol use: Yes    Alcohol/week: 0.0 standard drinks    Comment: rarely   Drug use: No   Sexual activity: Yes    Birth control/protection: Post-menopausal    Comment: 1st intercourse 70 yo-Fewer than 5 partners  Other Topics Concern   Not on file  Social History Narrative   Patient lives in [facility]   Patient is right handed   Caffeine consumption is 3 cups a day   Social Determinants of Health   Financial Resource Strain: Not on file  Food Insecurity: Not on file   Transportation Needs: Not on file  Physical Activity: Not on file  Stress: Not on file  Social Connections: Not on file  Intimate Partner Violence: Not on file     PHYSICAL EXAM  Vitals:   05/08/21 1615  BP: (!) 175/98  Pulse: 62    There is no height or weight on file to calculate BMI.   General: The patient is well-developed and well-nourished and in no acute distress    Neurologic Exam  Mental status: The patient is alert and oriented x 3 at the time of the examination. The patient has apparent normal recent and remote memory, with an apparently normal attention span and concentration ability.   Speech is normal.  Cranial nerves: Extraocular movements are full.  Facial strength and sensation is normal.  Trapezius strength is strong.  Motor:   Muscle bulk and tone are normal. Strength is  5 / 5 in arms and 4+/5 in legs.   .   Sensory: Intact sensation to touch and vibration in the arms and legs.  Coordination: Cerebellar testing shows good finger-nose-finger but mildly reduced heel-to-shin bilaterally.  Gait and station: Station is normal and gait has reduced stride and is wide.   She needs support to stand up.  She cannot take steps.  The Romberg is positive.  Reflexes: Deep tendon reflexes are symmetric and normal in the arms and ankles and increased at the knees.Marland Kitchen     DIAGNOSTIC DATA (LABS, IMAGING, TESTING) - I reviewed patient records, labs, notes, testing and imaging myself where available.  Lab Results  Component Value Date   WBC 8.1 12/14/2020   HGB 12.5 12/14/2020   HCT 38.1 12/14/2020   MCV 91.1 12/14/2020   PLT 247 12/14/2020      Component Value Date/Time   NA 137 12/14/2020 1449   NA 138 11/08/2020 1544   K 3.3 (L) 12/14/2020 1449   CL 104  12/14/2020 1449   CO2 21 (L) 12/14/2020 1449   GLUCOSE 95 12/14/2020 1449   BUN 11 12/14/2020 1449   BUN 18 11/08/2020 1544   CREATININE 0.74 12/14/2020 1449   CREATININE 0.69 05/17/2018 1125   CALCIUM  9.4 12/14/2020 1449   PROT 8.0 11/08/2020 1544   ALBUMIN 4.8 11/08/2020 1544   AST 18 11/08/2020 1544   ALT 14 11/08/2020 1544   ALKPHOS 108 11/08/2020 1544   BILITOT <0.2 11/08/2020 1544   GFRNONAA >60 12/14/2020 1449   GFRNONAA 77 01/22/2017 1249   GFRAA 80 11/08/2020 1544   GFRAA 89 01/22/2017 1249    __________________________________________ Impression/Plan   MS (multiple sclerosis) (HCC) - Plan: CBC with Differential/Platelet, Comprehensive metabolic panel, CANCELED: Hepatic function panel  High risk medication use - Plan: CBC with Differential/Platelet, Carbamazepine level, total, Comprehensive metabolic panel, CANCELED: Hepatic function panel  Trigeminal neuralgia - Plan: Carbamazepine level, total, Comprehensive metabolic panel  Abnormality of gait   1.   Continue Vumerity.   Check labs 2.   Continue Carbamezapine.  I will check a level today and adjust the dose if needed.  If she has a flareup we can always add Lyrica back.   . 3,   Stay active, use walker 4.   rtc 6 months, sooner if new or worsening issues  Angeldejesus Callaham A. Epimenio Foot, MD, PhD 05/08/2021, 4:31 PM Certified in Neurology, Clinical Neurophysiology, Sleep Medicine, Pain Medicine and Neuroimaging  [facility] [address] [place], [place] [address] [phone]

## 2021-05-09 ENCOUNTER — Telehealth: Payer: Self-pay | Admitting: *Deleted

## 2021-05-09 LAB — COMPREHENSIVE METABOLIC PANEL
ALT: 13 IU/L (ref 0–32)
AST: 19 IU/L (ref 0–40)
Albumin/Globulin Ratio: 1.6 (ref 1.2–2.2)
Albumin: 4.7 g/dL (ref 3.8–4.8)
Alkaline Phosphatase: 93 IU/L (ref 44–121)
BUN/Creatinine Ratio: 20 (ref 12–28)
BUN: 14 mg/dL (ref 8–27)
Bilirubin Total: 0.2 mg/dL (ref 0.0–1.2)
CO2: 24 mmol/L (ref 20–29)
Calcium: 9.4 mg/dL (ref 8.7–10.3)
Chloride: 96 mmol/L (ref 96–106)
Creatinine, Ser: 0.69 mg/dL (ref 0.57–1.00)
Globulin, Total: 2.9 g/dL (ref 1.5–4.5)
Glucose: 116 mg/dL — ABNORMAL HIGH (ref 65–99)
Potassium: 4.2 mmol/L (ref 3.5–5.2)
Sodium: 133 mmol/L — ABNORMAL LOW (ref 134–144)
Total Protein: 7.6 g/dL (ref 6.0–8.5)
eGFR: 93 mL/min/{1.73_m2} (ref >59)

## 2021-05-09 LAB — CBC WITH DIFFERENTIAL/PLATELET
Basophils Absolute: 0.1 10*3/uL (ref 0.0–0.2)
Basos: 1 %
EOS (ABSOLUTE): 0.3 10*3/uL (ref 0.0–0.4)
Eos: 4 %
Hematocrit: 37.4 % (ref 34.0–46.6)
Hemoglobin: 12.8 g/dL (ref 11.1–15.9)
Immature Grans (Abs): 0 10*3/uL (ref 0.0–0.1)
Immature Granulocytes: 0 %
Lymphocytes Absolute: 1.6 10*3/uL (ref 0.7–3.1)
Lymphs: 22 %
MCH: 30.1 pg (ref 26.6–33.0)
MCHC: 34.2 g/dL (ref 31.5–35.7)
MCV: 88 fL (ref 79–97)
Monocytes Absolute: 0.8 10*3/uL (ref 0.1–0.9)
Monocytes: 11 %
Neutrophils Absolute: 4.6 10*3/uL (ref 1.4–7.0)
Neutrophils: 62 %
Platelets: 261 10*3/uL (ref 150–450)
RBC: 4.25 x10E6/uL (ref 3.77–5.28)
RDW: 12.6 % (ref 11.7–15.4)
WBC: 7.4 10*3/uL (ref 3.4–10.8)

## 2021-05-09 LAB — CARBAMAZEPINE LEVEL, TOTAL: Carbamazepine (Tegretol), S: 10.5 ug/mL (ref 4.0–12.0)

## 2021-05-09 NOTE — Telephone Encounter (Signed)
-----   Message from Richard A Sater, MD sent at 05/09/2021 12:38 PM EDT ----- Please let the patient know that the lab work is fine.  

## 2021-05-09 NOTE — Telephone Encounter (Signed)
LVM for pt about lab results. Advised she did not have to call back unless she had further questions.

## 2021-07-04 ENCOUNTER — Other Ambulatory Visit: Payer: Self-pay | Admitting: Neurology

## 2021-07-04 DIAGNOSIS — G35 Multiple sclerosis: Secondary | ICD-10-CM

## 2021-11-21 ENCOUNTER — Encounter: Payer: Self-pay | Admitting: Neurology

## 2021-11-21 ENCOUNTER — Ambulatory Visit (INDEPENDENT_AMBULATORY_CARE_PROVIDER_SITE_OTHER): Payer: Medicare Other | Admitting: Neurology

## 2021-11-21 VITALS — BP 162/101 | HR 69 | Ht 65.5 in

## 2021-11-21 DIAGNOSIS — G500 Trigeminal neuralgia: Secondary | ICD-10-CM | POA: Diagnosis not present

## 2021-11-21 DIAGNOSIS — G35 Multiple sclerosis: Secondary | ICD-10-CM

## 2021-11-21 DIAGNOSIS — R208 Other disturbances of skin sensation: Secondary | ICD-10-CM

## 2021-11-21 DIAGNOSIS — Z79899 Other long term (current) drug therapy: Secondary | ICD-10-CM

## 2021-11-21 DIAGNOSIS — R269 Unspecified abnormalities of gait and mobility: Secondary | ICD-10-CM

## 2021-11-21 NOTE — Progress Notes (Signed)
[facility]  PATIENT: Melanie Richardson DOB: 01-19-1951  REFERRING CLINICIAN: Lonell Grandchild [place] HISTORY FROM: Patient MS     REASON FOR VISIT: MS and trigeminal Neuralgia   HISTORICAL  CHIEF COMPLAINT:  Chief Complaint  Patient presents with   Follow-up    Pt rm 16, MS follow up. On Vumerity. Overall stable and states she is doing well.     HISTORY OF PRESENT ILLNESS:  Melanie Richardson is a 71 year old woman with multiple sclerosis.  Update 11/21/2021: Her MS has been stable. No exacerbations.   She is on Vumerity and tolerates it well.    She is in [place] in the Assisted Living section.    She has had some issues with Med Tech's not renewing her med's on time.   At other times.  also insist she takes the hydrocodone 3 times a day    She has not been able to use a walker so is mostly in the wheelchair  during the day   She transfers independently and has no falls while transferring.   She has mild right > left leg weakness and poor balance..  She has some spasticity in the legs.  She has some lower back pain.     She self-propels in a manual wheelchair mostly.   She tries to exercise some.    She gets her meals in the cafeteria.    She has trigeminal neuralgia.  She was without hydrocodone x 3 days and pain flared up for a few days.   It  has done well with Tegretol and hydrocodone.   She has urinary urgency.  She feels worse with weather changes.     She gets nausea at times, especially if hot.     She is sleeping well most nights.    Mood is doing well.   No new cognitive issues.  She continues to note some reduced focus/attention.  She has friends in the assisted living.   She tries to stay cognitively active.     MS History:   She had the onset of right sided facial numbness now pregnant during the 1970s. That numbness resolved over the next month or so. However, in the mid 1990s she began to experience pain in the same distribution. She had an MRI of the  brain performed by Dr. Morrell Riddle in 1998. It was consistent with multiple sclerosis.  She had an LP in the 1970's and the CSF was not significantly abnormal to diagnose MS.   She has been on Rebif since 2004.  She had an exacerbation of March 2018 with worsening gait, left visual changes. She also had facial weakness that could be a Bell's palsy.   She switch to Tecfidera May 2018 due to the exacerbation and new activity on the MRI.  MRI 01/15/2017 shows multiple T2/FLAIR hyperintense foci in the periventricular, juxtacortical and deep white matter and also in the pons and right middle cerebral peduncle.  None of the foci enhance.   REVIEW OF SYSTEMS:  Constitutional: No fevers, chills, sweats, or change in appetite.   Some fatigue and insomnia Eyes: No visual changes, double vision, eye pain Ear, nose and throat: No hearing loss, ear pain, nasal congestion, sore throat Cardiovascular: No chest pain, palpitations Respiratory:  No shortness of breath at rest or with exertion.   No wheezes GastrointestinaI: No nausea, vomiting, diarrhea, abdominal pain, fecal incontinence Genitourinary:  No dysuria, urinary retention or frequency.  No nocturia. Musculoskeletal:  No neck pain, back pain  Integumentary: No rash, pruritus, skin lesions Neurological: as above Psychiatric: No depression at this time.  No anxiety Endocrine: No palpitations, diaphoresis, change in appetite, change in weigh or increased thirst Hematologic/Lymphatic:  No anemia, purpura, petechiae. Allergic/Immunologic: No itchy/runny eyes, nasal congestion, recent allergic reactions, rashes  ALLERGIES: Allergies  Allergen Reactions   Ciprofloxacin Other (See Comments)    Dizziness and headache   Codeine Other (See Comments)    GI upset   Erythromycin Other (See Comments)    Unknown    HOME MEDICATIONS: Outpatient Medications Prior to Visit  Medication Sig Dispense Refill   albuterol (PROVENTIL HFA;VENTOLIN HFA) 108 (90 BASE)  MCG/ACT inhaler Inhale 2 puffs into the lungs every 6 (six) hours as needed for wheezing. 1 Inhaler 6   aspirin 81 MG tablet Take 81 mg by mouth daily.     atorvastatin (LIPITOR) 10 MG tablet Take 10 mg by mouth daily.     busPIRone (BUSPAR) 10 MG tablet Take 10 mg by mouth 2 (two) times daily.     carbamazepine (TEGRETOL XR) 200 MG 12 hr tablet Take 1 tablet (200 mg total) by mouth 3 (three) times daily. 90 tablet 0   Cholecalciferol (VITAMIN D PO) Take 1,000 Units by mouth daily.      cycloSPORINE (RESTASIS) 0.05 % ophthalmic emulsion Place 1 drop into both eyes 2 (two) times daily.     feeding supplement, ENSURE ENLIVE, (ENSURE ENLIVE) LIQD Take 237 mLs by mouth 2 (two) times daily between meals. 237 mL 12   fluticasone (FLONASE) 50 MCG/ACT nasal spray SPRAY 2 SPRAYS INTO EACH NOSTRIL EVERY DAY (Patient taking differently: Place 2 sprays into both nostrils daily as needed for allergies.) 16 g 3   HYDROcodone-acetaminophen (NORCO) 7.5-325 MG tablet Take 1 tablet by mouth every 8 (eight) hours as needed for moderate pain. 15 tablet 0   hydrocortisone cream 1 % Apply 1 application topically 2 (two) times daily.     hydrOXYzine (ATARAX/VISTARIL) 25 MG tablet Take 25 mg by mouth 3 (three) times daily as needed.     ibuprofen (ADVIL) 400 MG tablet Take 1 tablet (400 mg total) by mouth every 6 (six) hours as needed for fever, headache or mild pain. 30 tablet 0   ipratropium (ATROVENT) 0.06 % nasal spray Place 1 spray into the nose daily as needed for rhinitis.      levothyroxine (SYNTHROID) 75 MCG tablet Take 75 mcg by mouth daily before breakfast.     metoprolol succinate (TOPROL-XL) 25 MG 24 hr tablet TAKE 1 TABLET BY MOUTH EVERY DAY (Patient taking differently: Take 25 mg by mouth daily.) 90 tablet 3   mirtazapine (REMERON) 7.5 MG tablet Take 7.5 mg by mouth at bedtime.     Multiple Vitamin (MULTIVITAMIN) tablet Take 1 tablet by mouth daily.     VUMERITY 231 MG CPDR TAKE 2 CAPSULES (462MG ) BY MOUTH  TWICE DAILY. 360 capsule 3   No facility-administered medications prior to visit.    PAST MEDICAL HISTORY: Past Medical History:  Diagnosis Date   Hypertension    MS (multiple sclerosis) ([place])    Osteopenia 05/2014   T score -1.8 FRAX 16%/4.7%   Thyroid disease    Hypothyroid   Trigeminal neuralgia     PAST SURGICAL HISTORY: Past Surgical History:  Procedure Laterality Date   BACK SURGERY  2011   [place]   shattered elbow   feet surgery  1990   gamma knife  2005   radiofrquency rhizotomy  [id number]   underarm surgery  1988    FAMILY HISTORY: Family History  Problem Relation Age of Onset   Heart disease Mother    Hypertension Father    Cancer Father        colon   Stroke Father    Breast cancer Maternal Aunt 38   Breast cancer Cousin 34    SOCIAL HISTORY:  Social History   Socioeconomic History   Marital status: Married    Spouse name: Not on file   Number of children: 2   Years of education: college   Highest education level: Not on file  Occupational History    Employer: OTHER  Tobacco Use   Smoking status: Every Day    Packs/day: 1.00    Types: Cigarettes   Smokeless tobacco: Never  Substance and Sexual Activity   Alcohol use: Yes    Alcohol/week: 0.0 standard drinks    Comment: rarely   Drug use: No   Sexual activity: Yes    Birth control/protection: Post-menopausal    Comment: 1st intercourse 71 yo-Fewer than 5 partners  Other Topics Concern   Not on file  Social History Narrative   Patient lives in [facility] assisted living[address]   Patient is right handed   Caffeine consumption is 3 cups a day   Social Determinants of Health   Financial Resource Strain: Not on file  Food Insecurity: Not on file  Transportation Needs: Not on file  Physical Activity: Not on file  Stress: Not on file  Social Connections: Not on file  Intimate Partner Violence: Not on file     PHYSICAL EXAM  Vitals:   11/21/21 1525  BP: (!)  162/101  Pulse: 69  Height: 5' 5.5" (1.664 m)    Body mass index is 22.12 kg/m.   General: The patient is well-developed and well-nourished and in no acute distress    Neurologic Exam  Mental status: The patient is alert and oriented x 3 at the time of the examination. The patient has apparent normal recent and remote memory, with an apparently normal attention span and concentration ability.   Speech is normal.  Cranial nerves: Extraocular movements are full.  Facial strength and sensation is normal.  Trapezius strength is strong.  Motor:   Muscle bulk and tone are normal. Strength is  5 / 5 in arms and 4+/5 in legs fairly symmetric   .   Sensory: Intact sensation to touch and vibration in the arms and legs.  Coordination: Cerebellar testing shows good finger-nose-finger but mildly reduced heel-to-shin bilaterally.  Gait and station:   With bilateral support she is able to stand up but can only take a couple steps.  The Romberg is positive.  Reflexes: Deep tendon reflexes are symmetric and normal in the arms and ankles and increased at the knees .Marland Kitchen     DIAGNOSTIC DATA (LABS, IMAGING, TESTING) - I reviewed patient records, labs, notes, testing and imaging myself where available.  Lab Results  Component Value Date   WBC 7.4 05/08/2021   HGB 12.8 05/08/2021   HCT 37.4 05/08/2021   MCV 88 05/08/2021   PLT 261 05/08/2021      Component Value Date/Time   NA 133 (L) 05/08/2021 1633   K 4.2 05/08/2021 1633   CL 96 05/08/2021 1633   CO2 24 05/08/2021 1633   GLUCOSE 116 (H) 05/08/2021 1633   GLUCOSE 95 12/14/2020 1449   BUN 14 05/08/2021 1633   CREATININE 0.69 05/08/2021  1633   CREATININE 0.69 05/17/2018 1125   CALCIUM 9.4 05/08/2021 1633   PROT 7.6 05/08/2021 1633   ALBUMIN 4.7 05/08/2021 1633   AST 19 05/08/2021 1633   ALT 13 05/08/2021 1633   ALKPHOS 93 05/08/2021 1633   BILITOT 0.2 05/08/2021 1633   GFRNONAA >60 12/14/2020 1449   GFRNONAA 77 01/22/2017 1249    GFRAA 80 11/08/2020 1544   GFRAA 89 01/22/2017 1249    __________________________________________ Impression/Plan   MS (multiple sclerosis) ([address]) - Plan: Carbamazepine level, total, CBC with Differential/Platelet, Comprehensive metabolic panel  Trigeminal neuralgia - Plan: Carbamazepine level, total, CBC with Differential/Platelet, Comprehensive metabolic panel  High risk medication use - Plan: Carbamazepine level, total, CBC with Differential/Platelet, Comprehensive metabolic panel  Dysesthesia  Abnormality of gait   1.   She will continue Vumerity.  I will check lab work today. 2.   Continue Carbamezapine.  The level will be checked today and the dose adjusted as needed.  If the trigeminal neuralgia greatly worsens consider adding Lyrica back 3,   Stay active, due to weakness, she should only try using the walker if somebody is in the room with her 4.   rtc 6 months, sooner if new or worsening issues  Millissa Deese A. Felecia Shelling, MD, PhD [id number], 0000000 PM Certified in Neurology, Clinical Neurophysiology, Sleep Medicine, Pain Medicine and Neuroimaging  [facility] [address] [place], [place] [address] [phone]

## 2021-11-22 LAB — CBC WITH DIFFERENTIAL/PLATELET
Basophils Absolute: 0.1 10*3/uL (ref 0.0–0.2)
Basos: 1 %
EOS (ABSOLUTE): 0.2 10*3/uL (ref 0.0–0.4)
Eos: 3 %
Hematocrit: 37.9 % (ref 34.0–46.6)
Hemoglobin: 13 g/dL (ref 11.1–15.9)
Immature Grans (Abs): 0 10*3/uL (ref 0.0–0.1)
Immature Granulocytes: 0 %
Lymphocytes Absolute: 1.2 10*3/uL (ref 0.7–3.1)
Lymphs: 21 %
MCH: 30.4 pg (ref 26.6–33.0)
MCHC: 34.3 g/dL (ref 31.5–35.7)
MCV: 89 fL (ref 79–97)
Monocytes Absolute: 0.7 10*3/uL (ref 0.1–0.9)
Monocytes: 12 %
Neutrophils Absolute: 3.8 10*3/uL (ref 1.4–7.0)
Neutrophils: 63 %
Platelets: 266 10*3/uL (ref 150–450)
RBC: 4.28 x10E6/uL (ref 3.77–5.28)
RDW: 12.1 % (ref 11.7–15.4)
WBC: 5.9 10*3/uL (ref 3.4–10.8)

## 2021-11-22 LAB — COMPREHENSIVE METABOLIC PANEL
ALT: 16 IU/L (ref 0–32)
AST: 15 IU/L (ref 0–40)
Albumin/Globulin Ratio: 1.5 (ref 1.2–2.2)
Albumin: 4.8 g/dL (ref 3.8–4.8)
Alkaline Phosphatase: 85 IU/L (ref 44–121)
BUN/Creatinine Ratio: 19 (ref 12–28)
BUN: 13 mg/dL (ref 8–27)
Bilirubin Total: <0.2 mg/dL (ref 0.0–1.2)
CO2: 23 mmol/L (ref 20–29)
Calcium: 9.6 mg/dL (ref 8.7–10.3)
Chloride: 97 mmol/L (ref 96–106)
Creatinine, Ser: 0.69 mg/dL (ref 0.57–1.00)
Globulin, Total: 3.1 g/dL (ref 1.5–4.5)
Glucose: 113 mg/dL — ABNORMAL HIGH (ref 70–99)
Potassium: 4.3 mmol/L (ref 3.5–5.2)
Sodium: 133 mmol/L — ABNORMAL LOW (ref 134–144)
Total Protein: 7.9 g/dL (ref 6.0–8.5)
eGFR: 93 mL/min/{1.73_m2} (ref >59)

## 2021-11-22 LAB — CARBAMAZEPINE LEVEL, TOTAL: Carbamazepine (Tegretol), S: 9.2 ug/mL (ref 4.0–12.0)

## 2021-11-25 ENCOUNTER — Telehealth: Payer: Self-pay

## 2021-11-25 NOTE — Telephone Encounter (Signed)
-----   Message from Asa Lente, MD sent at 11/22/2021  1:08 PM EST ----- Please let her know that the lab work looks okay.  The Tegretol level is good.

## 2021-11-25 NOTE — Telephone Encounter (Signed)
I called patient. I spoke with [name], son, per DPR. I discussed her labs. Patient's son verbalized understanding and had no questions or concerns at this time.

## 2022-02-06 IMAGING — MR MR HEAD WO/W CM
11 of 13 series · 38 of 48 positions shown · IV contrast (Gadavist)
Comparison: Head CT 02/14/2019 and MRI 01/13/2017

CLINICAL DATA: Multiple sclerosis.  Right-sided facial pain.

EXAM:
MRI HEAD WITHOUT AND WITH CONTRAST
TECHNIQUE: Multiplanar, multiecho pulse sequences of the brain and surrounding
structures were obtained without and with intravenous contrast.
CONTRAST:  6mL GADAVIST GADOBUTROL 1 MMOL/ML IV SOLN

[Series 5: DWI · axial · 3.0mm · 0.88mm/px · z∈[-76,+68]mm · 7 of 100 slices shown (1 of 4)]
[im 1/100]
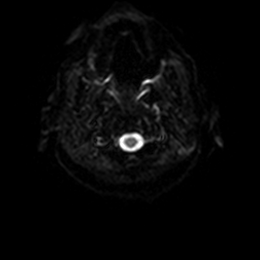
[im 17/100]
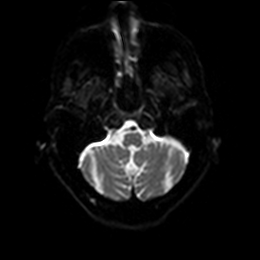
[im 34/100]
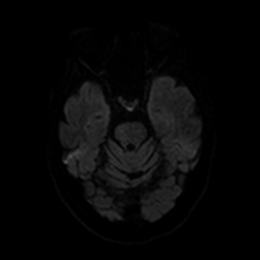
[im 50/100]
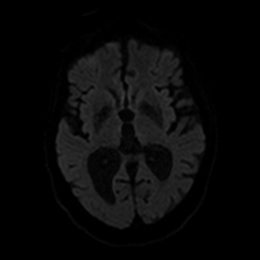
[im 67/100]
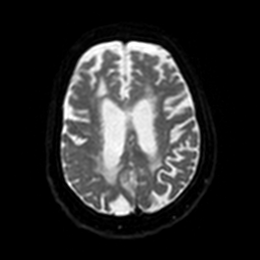
[im 83/100]
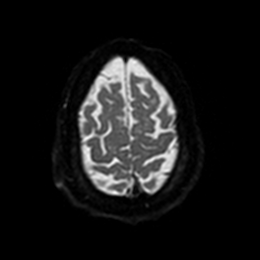
[im 100/100]
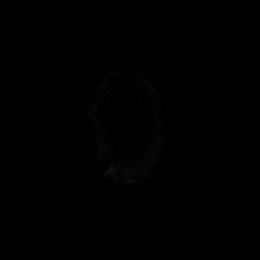

[Series 6: DWI · axial · 3.0mm · 0.88mm/px · z∈[-76,+68]mm · 4 of 50 slices shown (2 of 4)]
[im 1/50]
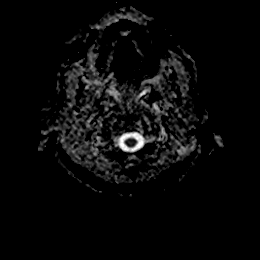
[im 17/50]
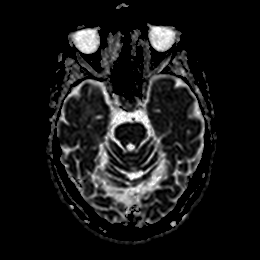
[im 33/50]
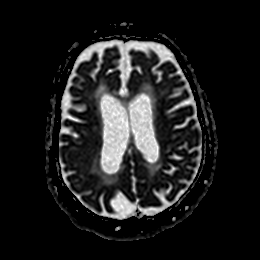
[im 50/50]
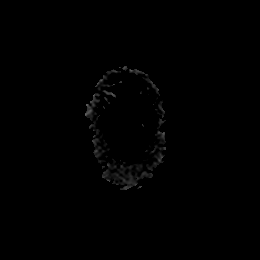

[Series 7: DWI · coronal · 4.0mm · 0.88mm/px · 5 of 66 slices shown (3 of 4)]
[im 1/66]
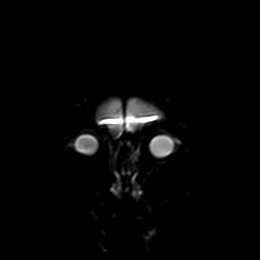
[im 17/66]
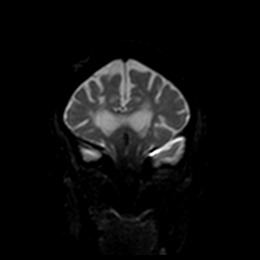
[im 33/66]
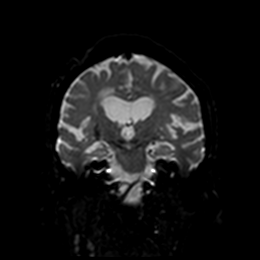
[im 49/66]
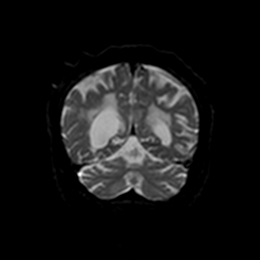
[im 66/66]
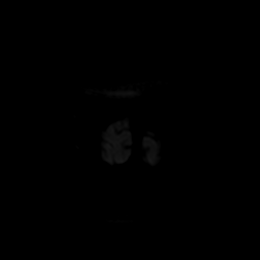

[Series 8: DWI · coronal · 4.0mm · 0.88mm/px · 3 of 33 slices shown (4 of 4)]
[im 1/33]
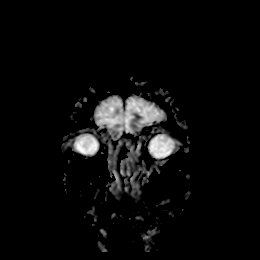
[im 17/33]
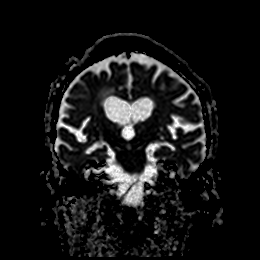
[im 33/33]
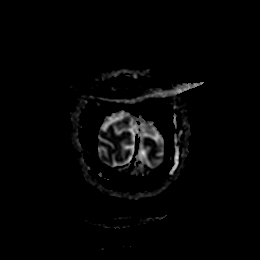

[Series 9: T1 · sagittal · 5.0mm · 0.78mm/px · 2 of 23 slices shown (1 of 2)]
[im 1/23]
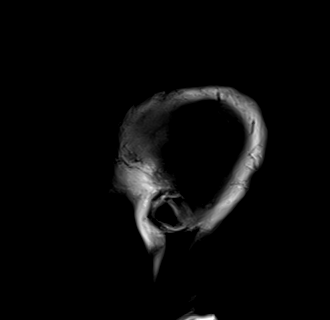
[im 23/23]
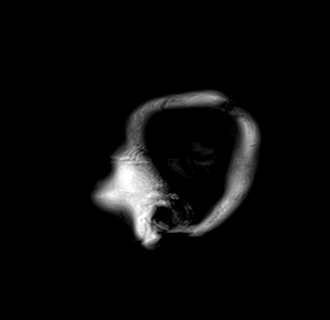

[Series 10: T2 · axial · 5.0mm · 0.72mm/px · 1 of 13 slices shown]
[im 1/13]
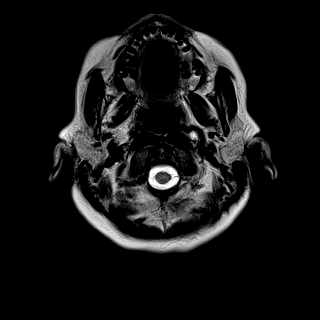

[Series 11: FLAIR · axial · 5.0mm · 0.45mm/px · z∈[-74,+67]mm · 2 of 25 slices shown]
[im 1/25]
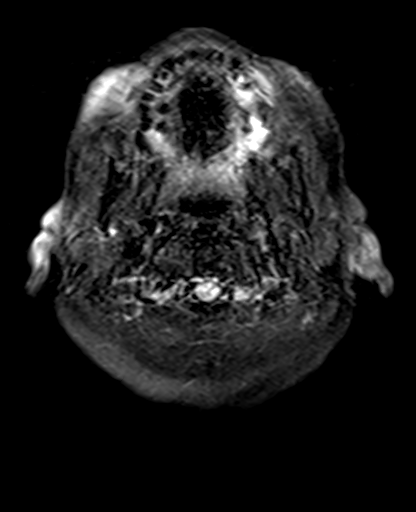
[im 25/25]
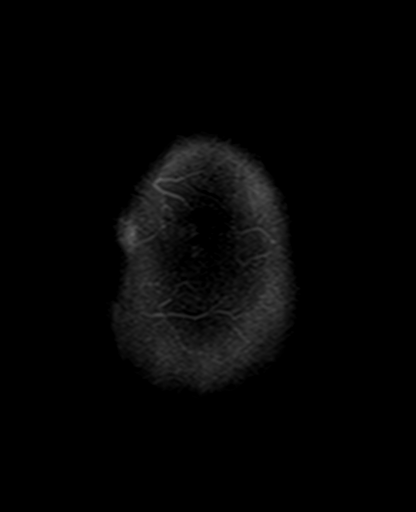

[Series 12: T1 · sagittal · 5.0mm · 0.78mm/px · 2 of 23 slices shown (2 of 2)]
[im 1/23]
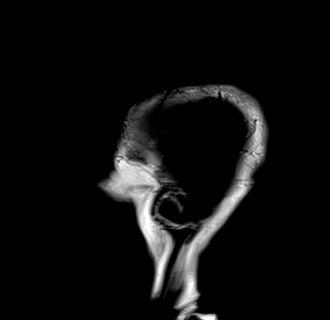
[im 23/23]
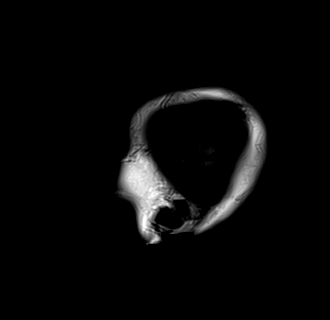

[Series 14: pha_images · axial · 3.0mm · 0.90mm/px · z∈[-93,+68]mm · 5 of 56 slices shown]
[im 1/56]
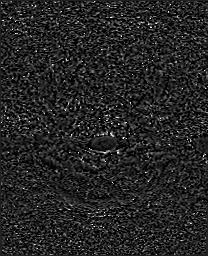
[im 14/56]
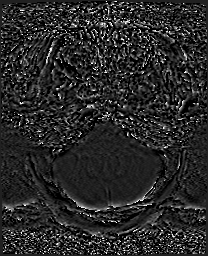
[im 28/56]
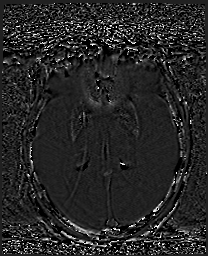
[im 42/56]
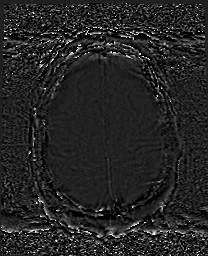
[im 56/56]
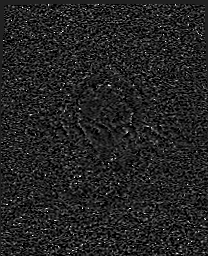

[Series 15: swi_images · axial · 3.0mm · 0.90mm/px · z∈[-96,+74]mm · 5 of 60 slices shown]
[im 1/60]
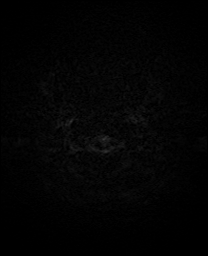
[im 15/60]
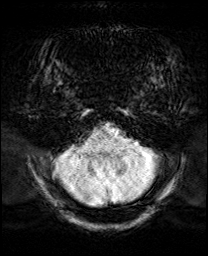
[im 30/60]
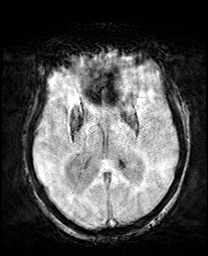
[im 45/60]
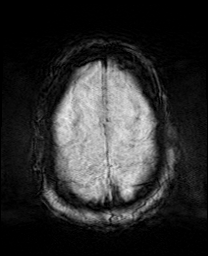
[im 60/60]
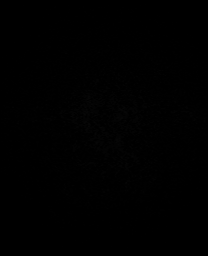

[Series 22: T2 post-contrast · coronal · 5.0mm · 0.72mm/px · 2 of 28 slices shown]
[im 1/28]
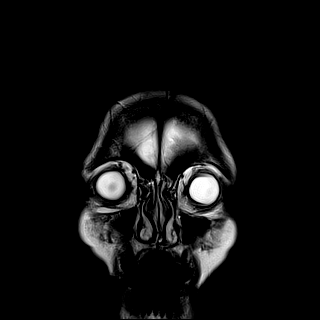
[im 28/28]
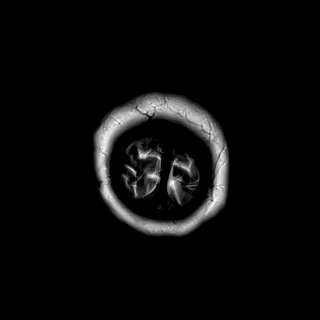

[38 of 48 positions shown; findings below may reference images not displayed]

FINDINGS: Due to patient confusion and inability to follow commands given by
the technologist, the study is moderately motion degraded and
incomplete with coronal T1 postcontrast and sagittal FLAIR sequences
not obtained.

Brain: There is no evidence of an acute infarct, intracranial
hemorrhage, mass, midline shift, or extra-axial fluid collection.
There is moderately advanced central predominant cerebral and
cerebellar atrophy. T2 hyperintensities in the cerebral white matter
bilaterally are similar to the prior MRI with greatest involvement
of the periventricular white matter including lesions oriented
perpendicularly to the lateral ventricles. There is also involvement
of the corpus callosum which is atrophic, and there are unchanged T2
hyperintensities in the pons and right middle cerebellar peduncle.
No abnormal enhancement is identified.

Vascular: Major intracranial vascular flow voids are preserved.

Skull and upper cervical spine: Unremarkable bone marrow signal.

Sinuses/Orbits: Paranasal sinuses and mastoid air cells are clear.

Other: None.
IMPRESSION: 1. Motion degraded, incomplete examination.
2. Chronic white matter disease consistent with multiple sclerosis.
No gross progression from 3387 or evidence of active demyelination
or other acute intracranial abnormality.

## 2022-05-22 ENCOUNTER — Encounter: Payer: Self-pay | Admitting: Neurology

## 2022-05-22 ENCOUNTER — Ambulatory Visit (INDEPENDENT_AMBULATORY_CARE_PROVIDER_SITE_OTHER): Payer: Medicare Other | Admitting: Neurology

## 2022-05-22 VITALS — BP 170/105 | HR 72 | Ht 65.0 in | Wt 130.0 lb

## 2022-05-22 DIAGNOSIS — G35 Multiple sclerosis: Secondary | ICD-10-CM | POA: Diagnosis not present

## 2022-05-22 DIAGNOSIS — R208 Other disturbances of skin sensation: Secondary | ICD-10-CM | POA: Diagnosis not present

## 2022-05-22 DIAGNOSIS — G500 Trigeminal neuralgia: Secondary | ICD-10-CM | POA: Diagnosis not present

## 2022-05-22 DIAGNOSIS — Z79899 Other long term (current) drug therapy: Secondary | ICD-10-CM

## 2022-05-22 DIAGNOSIS — R269 Unspecified abnormalities of gait and mobility: Secondary | ICD-10-CM

## 2022-05-22 NOTE — Progress Notes (Signed)
[facility]  PATIENT: Melanie Richardson DOB: 08-29-1951  REFERRING CLINICIAN: Kingsley Spittle [place] HISTORY FROM: Patient MS     REASON FOR VISIT: MS and trigeminal Neuralgia   HISTORICAL  CHIEF COMPLAINT:  Chief Complaint  Patient presents with   Follow-up    Rm 2, w son. Here for 6 month f/u for MS, on Vumerity and tolerating well. MS stable. No new or worsening in sx.     HISTORY OF PRESENT ILLNESS:  Melanie Richardson is a 71 year old woman with multiple sclerosis.  Update 05/22/2022: Her MS is stable. No exacerbations (had a definite one 2018).   She is on Vumerity and tolerates it well.    She is in [facility] in the Assisted Living section.       She spends most of the time in a wheelchair and now rarely uses the walker.   She can self propel the wheelchair.    She csn transfer to a toilet without help..     She has no falls while transferring.   She has mild right > left leg weakness and poor balance..  She has some spasticity in the legs.  She has some lower back pain.     She self-propels in a manual wheelchair mostly.   She tries to exercise some.    She gets her meals in the cafeteria.    She has trigeminal neuralgia but has done very well recently.   .  She is on carbamazepine and hydrocodone     She has urinary urgency.  She feels worse with weather changes.     She gets nausea at times, especially if hot.     She is sleeping well most nights and has mild delayed phase falling asleep around 12-1 am and getting getting up by 700-730 am.  .    Mood is doing well.   She has mild reduced focus/attention but no major cognitive issues.  She has friends in the assisted living.   She tries to stay cognitively active.     MS History:   She had the onset of right sided facial numbness now pregnant during the 1970s. That numbness resolved over the next month or so. However, in the mid 1990s she began to experience pain in the same distribution. She had an MRI of the  brain performed by Dr. Buzzy Han in 1998. It was consistent with multiple sclerosis.  She had an LP in the 1970's and the CSF was not significantly abnormal to diagnose MS.   She has been on Rebif since 2004.  She had an exacerbation of March 2018 with worsening gait, left visual changes. She also had facial weakness that could be a Bell's palsy.   She switch to Tecfidera May 2018 due to the exacerbation and new activity on the MRI.  MRI 01/15/2017 shows multiple T2/FLAIR hyperintense foci in the periventricular, juxtacortical and deep white matter and also in the pons and right middle cerebral peduncle.  None of the foci enhance.   REVIEW OF SYSTEMS:  Constitutional: No fevers, chills, sweats, or change in appetite.   Some fatigue and insomnia Eyes: No visual changes, double vision, eye pain Ear, nose and throat: No hearing loss, ear pain, nasal congestion, sore throat Cardiovascular: No chest pain, palpitations Respiratory:  No shortness of breath at rest or with exertion.   No wheezes GastrointestinaI: No nausea, vomiting, diarrhea, abdominal pain, fecal incontinence Genitourinary:  No dysuria, urinary retention or frequency.  No nocturia. Musculoskeletal:  No  neck pain, back pain Integumentary: No rash, pruritus, skin lesions Neurological: as above Psychiatric: No depression at this time.  No anxiety Endocrine: No palpitations, diaphoresis, change in appetite, change in weigh or increased thirst Hematologic/Lymphatic:  No anemia, purpura, petechiae. Allergic/Immunologic: No itchy/runny eyes, nasal congestion, recent allergic reactions, rashes  ALLERGIES: Allergies  Allergen Reactions   Ciprofloxacin Other (See Comments)    Dizziness and headache   Codeine Other (See Comments)    GI upset   Erythromycin Other (See Comments)    Unknown    HOME MEDICATIONS: Outpatient Medications Prior to Visit  Medication Sig Dispense Refill   albuterol (PROVENTIL HFA;VENTOLIN HFA) 108 (90 BASE)  MCG/ACT inhaler Inhale 2 puffs into the lungs every 6 (six) hours as needed for wheezing. 1 Inhaler 6   aspirin 81 MG tablet Take 81 mg by mouth daily.     atorvastatin (LIPITOR) 10 MG tablet Take 10 mg by mouth daily.     busPIRone (BUSPAR) 10 MG tablet Take 10 mg by mouth 2 (two) times daily.     carbamazepine (TEGRETOL XR) 200 MG 12 hr tablet Take 1 tablet (200 mg total) by mouth 3 (three) times daily. 90 tablet 0   Cholecalciferol (VITAMIN D PO) Take 1,000 Units by mouth daily.      cycloSPORINE (RESTASIS) 0.05 % ophthalmic emulsion Place 1 drop into both eyes 2 (two) times daily.     feeding supplement, ENSURE ENLIVE, (ENSURE ENLIVE) LIQD Take 237 mLs by mouth 2 (two) times daily between meals. 237 mL 12   fluticasone (FLONASE) 50 MCG/ACT nasal spray SPRAY 2 SPRAYS INTO EACH NOSTRIL EVERY DAY (Patient taking differently: Place 2 sprays into both nostrils daily as needed for allergies.) 16 g 3   HYDROcodone-acetaminophen (NORCO) 7.5-325 MG tablet Take 1 tablet by mouth every 8 (eight) hours as needed for moderate pain. 15 tablet 0   hydrocortisone cream 1 % Apply 1 application topically 2 (two) times daily.     hydrOXYzine (ATARAX/VISTARIL) 25 MG tablet Take 25 mg by mouth 3 (three) times daily as needed.     ibuprofen (ADVIL) 400 MG tablet Take 1 tablet (400 mg total) by mouth every 6 (six) hours as needed for fever, headache or mild pain. 30 tablet 0   ipratropium (ATROVENT) 0.06 % nasal spray Place 1 spray into the nose daily as needed for rhinitis.      levothyroxine (SYNTHROID) 75 MCG tablet Take 75 mcg by mouth daily before breakfast.     metoprolol succinate (TOPROL-XL) 25 MG 24 hr tablet TAKE 1 TABLET BY MOUTH EVERY DAY (Patient taking differently: Take 25 mg by mouth daily.) 90 tablet 3   mirtazapine (REMERON) 7.5 MG tablet Take 7.5 mg by mouth at bedtime.     Multiple Vitamin (MULTIVITAMIN) tablet Take 1 tablet by mouth daily.     VUMERITY 231 MG CPDR TAKE 2 CAPSULES (462MG ) BY MOUTH  TWICE DAILY. 360 capsule 3   No facility-administered medications prior to visit.    PAST MEDICAL HISTORY: Past Medical History:  Diagnosis Date   Hypertension    MS (multiple sclerosis) (HCC)    Osteopenia 05/2014   T score -1.8 FRAX 16%/4.7%   Thyroid disease    Hypothyroid   Trigeminal neuralgia     PAST SURGICAL HISTORY: Past Surgical History:  Procedure Laterality Date   BACK SURGERY  2011   ELBOW SURGERY  1999   shattered elbow   feet surgery  1990   gamma knife  2005  radiofrquency rhizotomy  [id number]   underarm surgery  1988    FAMILY HISTORY: Family History  Problem Relation Age of Onset   Heart disease Mother    Hypertension Father    Cancer Father        colon   Stroke Father    Breast cancer Maternal Aunt 31   Breast cancer Cousin 30    SOCIAL HISTORY:  Social History   Socioeconomic History   Marital status: Married    Spouse name: Not on file   Number of children: 2   Years of education: college   Highest education level: Not on file  Occupational History    Employer: OTHER  Tobacco Use   Smoking status: Every Day    Packs/day: 1.00    Types: Cigarettes   Smokeless tobacco: Never  Substance and Sexual Activity   Alcohol use: Yes    Alcohol/week: 0.0 standard drinks of alcohol    Comment: rarely   Drug use: No   Sexual activity: Yes    Birth control/protection: Post-menopausal    Comment: 1st intercourse 71 yo-Fewer than 5 partners  Other Topics Concern   Not on file  Social History Narrative   Patient [place] in [facility]   Patient is right handed   Caffeine consumption is 3 cups a day   Social Determinants of Health   Financial Resource Strain: Not on file  Food Insecurity: Not on file  Transportation Needs: Not on file  Physical Activity: Not on file  Stress: Not on file  Social Connections: Not on file  Intimate Partner Violence: Not on file     PHYSICAL EXAM  Vitals:   05/22/22 1515   BP: (!) 170/105  Pulse: 72  Weight: 130 lb (59 kg)  Height: 5\' 5"  (1.651 m)    Body mass index is 21.63 kg/m.   General: The patient is well-developed and well-nourished and in no acute distress    Neurologic Exam  Mental status: The patient is alert and oriented x 3 at the time of the examination. The patient has apparent normal recent and remote memory, with an apparently normal attention span and concentration ability.   Speech is normal.  Cranial nerves: Extraocular movements are full.  Facial strength and sensation is normal.  Trapezius strength is strong.  Motor:   Muscle bulk and tone are normal. Strength is  5 / 5 in arms and 4+/5 in legs fairly symmetric   .   Sensory: Intact sensation to touch and vibration in the arms and legs.  Coordination: Cerebellar testing shows good finger-nose-finger but mildly reduced heel-to-shin bilaterally.  Gait and station:   With bilateral support she is able to stand up but can only take a couple steps.  The Romberg is positive.  Reflexes: Deep tendon reflexes are symmetric and normal in the arms and ankles and increased at the knees .     DIAGNOSTIC DATA (LABS, IMAGING, TESTING) - I reviewed patient records, labs, notes, testing and imaging myself where available.  Lab Results  Component Value Date   WBC 5.9 11/21/2021   HGB 13.0 11/21/2021   HCT 37.9 11/21/2021   MCV 89 11/21/2021   PLT 266 11/21/2021      Component Value Date/Time   NA 133 (L) 11/21/2021 1611   K 4.3 11/21/2021 1611   CL 97 11/21/2021 1611   CO2 23 11/21/2021 1611   GLUCOSE 113 (H) 11/21/2021 1611   GLUCOSE 95 12/14/2020 1449  BUN 13 11/21/2021 1611   CREATININE 0.69 11/21/2021 1611   CREATININE 0.69 05/17/2018 1125   CALCIUM 9.6 11/21/2021 1611   PROT 7.9 11/21/2021 1611   ALBUMIN 4.8 11/21/2021 1611   AST 15 11/21/2021 1611   ALT 16 11/21/2021 1611   ALKPHOS 85 11/21/2021 1611   BILITOT <0.2 11/21/2021 1611   GFRNONAA >60 12/14/2020 1449    GFRNONAA 77 01/22/2017 1249   GFRAA 80 11/08/2020 1544   GFRAA 89 01/22/2017 1249    __________________________________________ Impression/Plan   MS (multiple sclerosis) (HCC)  Trigeminal neuralgia  High risk medication use  Dysesthesia  Abnormality of gait   1.   She will continue Vumerity.  Labwork fine earlier this year 2.   Continue Carbamezapine.    If the trigeminal neuralgia greatly worsens consider adding Lyrica back 3,   Stay active, due to weakness, she should only try using the walker if somebody is in the room with her 4.   rtc 6 months, sooner if new or worsening issues  Latonya Knight A. Epimenio Foot, MD, PhD 05/22/2022, 9:01 PM Certified in Neurology, Clinical Neurophysiology, Sleep Medicine, Pain Medicine and Neuroimaging  [facility] [address] [place], [place] [address] [phone]

## 2022-06-05 ENCOUNTER — Other Ambulatory Visit: Payer: Self-pay | Admitting: Neurology

## 2022-06-05 DIAGNOSIS — G35 Multiple sclerosis: Secondary | ICD-10-CM

## 2022-07-22 ENCOUNTER — Telehealth: Payer: Self-pay | Admitting: Neurology

## 2022-07-22 NOTE — Telephone Encounter (Signed)
Called the number back which went to [facility] answering service. She tried to connect me to a clinical person to speak with and she was unavailable. She said that she would have them call back.   **If they call back, I do not see where Dr Felecia Shelling orders a cream for the patient nor is a cream discussed in the office notes. This likely would need to come from primary care MD. If you can get more information on what cream she is asking for and why she needs it. We typically don't order skin cream.

## 2022-07-22 NOTE — Telephone Encounter (Signed)
Pt is calling. Stated she needs a prescription for some cream for skin. Pt said Nanine Means will not give her the cream without a prescription.

## 2022-10-19 ENCOUNTER — Emergency Department (HOSPITAL_COMMUNITY): Payer: Medicare Other

## 2022-10-19 ENCOUNTER — Inpatient Hospital Stay (HOSPITAL_COMMUNITY)
Admission: EM | Admit: 2022-10-19 | Discharge: 2022-11-06 | DRG: 853 | Disposition: E | Payer: Medicare Other | Source: Skilled Nursing Facility | Attending: Internal Medicine | Admitting: Internal Medicine

## 2022-10-19 ENCOUNTER — Other Ambulatory Visit: Payer: Self-pay

## 2022-10-19 DIAGNOSIS — L89159 Pressure ulcer of sacral region, unspecified stage: Secondary | ICD-10-CM | POA: Diagnosis present

## 2022-10-19 DIAGNOSIS — R410 Disorientation, unspecified: Secondary | ICD-10-CM | POA: Diagnosis not present

## 2022-10-19 DIAGNOSIS — E440 Moderate protein-calorie malnutrition: Secondary | ICD-10-CM | POA: Insufficient documentation

## 2022-10-19 DIAGNOSIS — M7989 Other specified soft tissue disorders: Secondary | ICD-10-CM

## 2022-10-19 DIAGNOSIS — I10 Essential (primary) hypertension: Secondary | ICD-10-CM | POA: Diagnosis not present

## 2022-10-19 DIAGNOSIS — L03116 Cellulitis of left lower limb: Secondary | ICD-10-CM | POA: Diagnosis present

## 2022-10-19 DIAGNOSIS — G9341 Metabolic encephalopathy: Secondary | ICD-10-CM | POA: Diagnosis present

## 2022-10-19 DIAGNOSIS — Z1152 Encounter for screening for COVID-19: Secondary | ICD-10-CM

## 2022-10-19 DIAGNOSIS — Z881 Allergy status to other antibiotic agents status: Secondary | ICD-10-CM

## 2022-10-19 DIAGNOSIS — Z515 Encounter for palliative care: Secondary | ICD-10-CM

## 2022-10-19 DIAGNOSIS — L538 Other specified erythematous conditions: Secondary | ICD-10-CM

## 2022-10-19 DIAGNOSIS — Z79890 Hormone replacement therapy (postmenopausal): Secondary | ICD-10-CM

## 2022-10-19 DIAGNOSIS — A419 Sepsis, unspecified organism: Principal | ICD-10-CM | POA: Insufficient documentation

## 2022-10-19 DIAGNOSIS — F17210 Nicotine dependence, cigarettes, uncomplicated: Secondary | ICD-10-CM | POA: Diagnosis present

## 2022-10-19 DIAGNOSIS — R451 Restlessness and agitation: Secondary | ICD-10-CM

## 2022-10-19 DIAGNOSIS — L03032 Cellulitis of left toe: Secondary | ICD-10-CM | POA: Diagnosis not present

## 2022-10-19 DIAGNOSIS — Z885 Allergy status to narcotic agent status: Secondary | ICD-10-CM

## 2022-10-19 DIAGNOSIS — F0390 Unspecified dementia without behavioral disturbance: Secondary | ICD-10-CM | POA: Diagnosis present

## 2022-10-19 DIAGNOSIS — Z7982 Long term (current) use of aspirin: Secondary | ICD-10-CM

## 2022-10-19 DIAGNOSIS — E039 Hypothyroidism, unspecified: Secondary | ICD-10-CM | POA: Diagnosis present

## 2022-10-19 DIAGNOSIS — Z993 Dependence on wheelchair: Secondary | ICD-10-CM

## 2022-10-19 DIAGNOSIS — Z66 Do not resuscitate: Secondary | ICD-10-CM | POA: Diagnosis present

## 2022-10-19 DIAGNOSIS — R531 Weakness: Secondary | ICD-10-CM

## 2022-10-19 DIAGNOSIS — G35 Multiple sclerosis: Secondary | ICD-10-CM | POA: Diagnosis present

## 2022-10-19 DIAGNOSIS — R4182 Altered mental status, unspecified: Principal | ICD-10-CM

## 2022-10-19 DIAGNOSIS — I872 Venous insufficiency (chronic) (peripheral): Secondary | ICD-10-CM | POA: Diagnosis present

## 2022-10-19 DIAGNOSIS — G510 Bell's palsy: Secondary | ICD-10-CM | POA: Diagnosis present

## 2022-10-19 DIAGNOSIS — L89152 Pressure ulcer of sacral region, stage 2: Secondary | ICD-10-CM | POA: Diagnosis present

## 2022-10-19 DIAGNOSIS — E876 Hypokalemia: Secondary | ICD-10-CM | POA: Diagnosis not present

## 2022-10-19 DIAGNOSIS — L97522 Non-pressure chronic ulcer of other part of left foot with fat layer exposed: Secondary | ICD-10-CM | POA: Diagnosis present

## 2022-10-19 DIAGNOSIS — R5381 Other malaise: Secondary | ICD-10-CM | POA: Diagnosis not present

## 2022-10-19 DIAGNOSIS — M86672 Other chronic osteomyelitis, left ankle and foot: Secondary | ICD-10-CM | POA: Diagnosis present

## 2022-10-19 DIAGNOSIS — Z6821 Body mass index (BMI) 21.0-21.9, adult: Secondary | ICD-10-CM

## 2022-10-19 DIAGNOSIS — L89151 Pressure ulcer of sacral region, stage 1: Secondary | ICD-10-CM | POA: Diagnosis not present

## 2022-10-19 DIAGNOSIS — Z79899 Other long term (current) drug therapy: Secondary | ICD-10-CM

## 2022-10-19 LAB — COMPREHENSIVE METABOLIC PANEL
ALT: 18 U/L (ref 0–44)
AST: 28 U/L (ref 15–41)
Albumin: 3.7 g/dL (ref 3.5–5.0)
Alkaline Phosphatase: 56 U/L (ref 38–126)
Anion gap: 10 (ref 5–15)
BUN: 13 mg/dL (ref 8–23)
CO2: 26 mmol/L (ref 22–32)
Calcium: 8.6 mg/dL — ABNORMAL LOW (ref 8.9–10.3)
Chloride: 101 mmol/L (ref 98–111)
Creatinine, Ser: 0.66 mg/dL (ref 0.44–1.00)
GFR, Estimated: >60 mL/min (ref >60)
Glucose, Bld: 99 mg/dL (ref 70–99)
Potassium: 3.2 mmol/L — ABNORMAL LOW (ref 3.5–5.1)
Sodium: 137 mmol/L (ref 135–145)
Total Bilirubin: 0.5 mg/dL (ref 0.3–1.2)
Total Protein: 8.1 g/dL (ref 6.5–8.1)

## 2022-10-19 LAB — CBC WITH DIFFERENTIAL/PLATELET
Abs Immature Granulocytes: 0.06 10*3/uL (ref 0.00–0.07)
Basophils Absolute: 0.1 10*3/uL (ref 0.0–0.1)
Basophils Relative: 1 %
Eosinophils Absolute: 0.1 10*3/uL (ref 0.0–0.5)
Eosinophils Relative: 1 %
HCT: 34.5 % — ABNORMAL LOW (ref 36.0–46.0)
Hemoglobin: 11.6 g/dL — ABNORMAL LOW (ref 12.0–15.0)
Immature Granulocytes: 1 %
Lymphocytes Relative: 10 %
Lymphs Abs: 1.2 10*3/uL (ref 0.7–4.0)
MCH: 30.8 pg (ref 26.0–34.0)
MCHC: 33.6 g/dL (ref 30.0–36.0)
MCV: 91.5 fL (ref 80.0–100.0)
Monocytes Absolute: 1 10*3/uL (ref 0.1–1.0)
Monocytes Relative: 8 %
Neutro Abs: 10 10*3/uL — ABNORMAL HIGH (ref 1.7–7.7)
Neutrophils Relative %: 79 %
Platelets: ADEQUATE 10*3/uL (ref 150–400)
RBC: 3.77 MIL/uL — ABNORMAL LOW (ref 3.87–5.11)
RDW: 13.1 % (ref 11.5–15.5)
WBC: 12.5 10*3/uL — ABNORMAL HIGH (ref 4.0–10.5)
nRBC: 0 % (ref 0.0–0.2)

## 2022-10-19 LAB — RESP PANEL BY RT-PCR (RSV, FLU A&B, COVID)  RVPGX2
Influenza A by PCR: NEGATIVE
Influenza B by PCR: NEGATIVE
Resp Syncytial Virus by PCR: NEGATIVE
SARS Coronavirus 2 by RT PCR: NEGATIVE

## 2022-10-19 LAB — URINALYSIS, ROUTINE W REFLEX MICROSCOPIC
Bilirubin Urine: NEGATIVE
Glucose, UA: NEGATIVE mg/dL
Hgb urine dipstick: NEGATIVE
Ketones, ur: 5 mg/dL — AB
Leukocytes,Ua: NEGATIVE
Nitrite: NEGATIVE
Protein, ur: NEGATIVE mg/dL
Specific Gravity, Urine: 1.017 (ref 1.005–1.030)
pH: 6 (ref 5.0–8.0)

## 2022-10-19 LAB — PROTIME-INR
INR: 1 (ref 0.8–1.2)
Prothrombin Time: 13.3 seconds (ref 11.4–15.2)

## 2022-10-19 LAB — APTT: aPTT: 27 seconds (ref 24–36)

## 2022-10-19 LAB — LACTIC ACID, PLASMA: Lactic Acid, Venous: 1.5 mmol/L (ref 0.5–1.9)

## 2022-10-19 MED ORDER — SODIUM CHLORIDE 0.9 % IV BOLUS
1000.0000 mL | Freq: Once | INTRAVENOUS | Status: AC
  Administered 2022-10-19: 1000 mL via INTRAVENOUS

## 2022-10-19 MED ORDER — FLUTICASONE PROPIONATE 50 MCG/ACT NA SUSP: 2.0000 | Freq: Every day | NASAL | Status: DC | PRN

## 2022-10-19 MED ORDER — ONDANSETRON HCL 4 MG/2ML IJ SOLN
4.0000 mg | Freq: Four times a day (QID) | INTRAMUSCULAR | Status: DC | PRN
  Administered 2022-10-23: 4 mg via INTRAVENOUS

## 2022-10-19 MED ORDER — SODIUM CHLORIDE 0.9 % IV SOLN
2.0000 g | Freq: Two times a day (BID) | INTRAVENOUS | Status: DC
  Administered 2022-10-20: 2 g via INTRAVENOUS
  Filled 2022-10-19: qty 12.5

## 2022-10-19 MED ORDER — ALBUTEROL SULFATE HFA 108 (90 BASE) MCG/ACT IN AERS: 2.0000 | INHALATION_SPRAY | Freq: Four times a day (QID) | RESPIRATORY_TRACT | Status: DC | PRN

## 2022-10-19 MED ORDER — ACETAMINOPHEN 650 MG RE SUPP
650.0000 mg | Freq: Four times a day (QID) | RECTAL | Status: DC | PRN
  Administered 2022-10-19 – 2022-10-25 (×6): 650 mg via RECTAL
  Filled 2022-10-19 (×8): qty 1

## 2022-10-19 MED ORDER — ALBUTEROL SULFATE (2.5 MG/3ML) 0.083% IN NEBU
2.5000 mg | INHALATION_SOLUTION | Freq: Four times a day (QID) | RESPIRATORY_TRACT | Status: DC | PRN
  Administered 2022-10-22 (×2): 2.5 mg via RESPIRATORY_TRACT
  Filled 2022-10-19 (×2): qty 3

## 2022-10-19 MED ORDER — HYDRALAZINE HCL 20 MG/ML IJ SOLN: 5.0000 mg | Freq: Four times a day (QID) | INTRAMUSCULAR | Status: DC | PRN

## 2022-10-19 MED ORDER — ACETAMINOPHEN 325 MG PO TABS
650.0000 mg | ORAL_TABLET | Freq: Four times a day (QID) | ORAL | Status: DC | PRN
  Administered 2022-10-22 – 2022-10-27 (×3): 650 mg via ORAL
  Filled 2022-10-19 (×4): qty 2

## 2022-10-19 MED ORDER — CYCLOSPORINE 0.05 % OP EMUL
1.0000 [drp] | Freq: Two times a day (BID) | OPHTHALMIC | Status: DC
  Administered 2022-10-19 – 2022-10-30 (×19): 1 [drp] via OPHTHALMIC
  Filled 2022-10-19 (×28): qty 30

## 2022-10-19 MED ORDER — LORAZEPAM 2 MG/ML IJ SOLN
1.0000 mg | Freq: Once | INTRAMUSCULAR | Status: AC
  Administered 2022-10-19: 1 mg via INTRAVENOUS
  Filled 2022-10-19: qty 1

## 2022-10-19 MED ORDER — VANCOMYCIN HCL IN DEXTROSE 1-5 GM/200ML-% IV SOLN
1000.0000 mg | INTRAVENOUS | Status: AC
  Administered 2022-10-20 – 2022-10-26 (×7): 1000 mg via INTRAVENOUS
  Filled 2022-10-19 (×7): qty 200

## 2022-10-19 MED ORDER — POTASSIUM CHLORIDE CRYS ER 20 MEQ PO TBCR
30.0000 meq | EXTENDED_RELEASE_TABLET | Freq: Once | ORAL | Status: AC
  Administered 2022-10-19: 30 meq via ORAL
  Filled 2022-10-19: qty 1

## 2022-10-19 MED ORDER — VANCOMYCIN HCL IN DEXTROSE 1-5 GM/200ML-% IV SOLN
1000.0000 mg | Freq: Once | INTRAVENOUS | Status: AC
  Administered 2022-10-19: 1000 mg via INTRAVENOUS
  Filled 2022-10-19: qty 200

## 2022-10-19 MED ORDER — SODIUM CHLORIDE 0.9 % IV SOLN
1.0000 g | Freq: Once | INTRAVENOUS | Status: AC
  Administered 2022-10-19: 1 g via INTRAVENOUS
  Filled 2022-10-19: qty 10

## 2022-10-19 MED ORDER — LORAZEPAM 2 MG/ML IJ SOLN
0.5000 mg | Freq: Once | INTRAMUSCULAR | Status: AC
  Administered 2022-10-19: 0.5 mg via INTRAVENOUS
  Filled 2022-10-19: qty 1

## 2022-10-19 MED ORDER — KETOROLAC TROMETHAMINE 15 MG/ML IJ SOLN
15.0000 mg | Freq: Once | INTRAMUSCULAR | Status: AC
  Administered 2022-10-19: 15 mg via INTRAVENOUS
  Filled 2022-10-19: qty 1

## 2022-10-19 MED ORDER — KCL IN DEXTROSE-NACL 10-5-0.45 MEQ/L-%-% IV SOLN
INTRAVENOUS | Status: DC
  Filled 2022-10-19 (×7): qty 1000

## 2022-10-19 MED ORDER — IOHEXOL 300 MG/ML  SOLN
100.0000 mL | Freq: Once | INTRAMUSCULAR | Status: AC | PRN
  Administered 2022-10-19: 100 mL via INTRAVENOUS

## 2022-10-19 MED ORDER — ONDANSETRON HCL 4 MG PO TABS: 4.0000 mg | ORAL_TABLET | Freq: Four times a day (QID) | ORAL | Status: DC | PRN

## 2022-10-19 MED ORDER — ENOXAPARIN SODIUM 40 MG/0.4ML IJ SOSY
40.0000 mg | PREFILLED_SYRINGE | INTRAMUSCULAR | Status: DC
  Administered 2022-10-19 – 2022-10-27 (×8): 40 mg via SUBCUTANEOUS
  Filled 2022-10-19 (×9): qty 0.4

## 2022-10-19 MED ORDER — SODIUM CHLORIDE 0.9 % IV SOLN
2.0000 g | Freq: Once | INTRAVENOUS | Status: AC
  Administered 2022-10-19: 2 g via INTRAVENOUS
  Filled 2022-10-19: qty 12.5

## 2022-10-19 NOTE — Progress Notes (Signed)
BLE venous duplex has been completed.  Limited due to patient condition.  Preliminary results given to Dr. Kathrynn Humble.   Results can be found under chart review under CV PROC. 10-21-2022 5:38 PM Madison Albea RVT, RDMS

## 2022-10-19 NOTE — H&P (Signed)
[facility] History and Physical  ASTOU LADA HCW:[id number] DOB: 08-Mar-1951 DOA: 10/17/2022  Referring physician: ED  PCP: Patient, No Pcp Per   Patient is coming from: Skilled nursing facility  Chief Complaint: Altered mental status  HPI: Patient is a 72 years old female with history of some cognitive impairment, multiple sclerosis, hypertension, hypothyroidism presented to hospital with confusion, disorientation, mild agitation at the facility.  Unable to obtain information from the patient.  Facility stated that she did have a UTI but had not been treated at the end of December.  There was no report of any fevers, chills or rigor but was noted to have low-grade fever in the ED.  Patient did not have any nausea or vomiting.  Patient however had some erythema induration and tenderness of the bilateral lower extremities with an ulceration on the left fifth toe which was new for the last 2 weeks or so.  In the ED, patient was moderately combative.  She was noted to have new ulceration on the foot.  Small sacral ulcer was also noted.  Vitals was notable for temperature of 99.9 F.  Laboratory data showed WBC elevated at 12.5 with hemoglobin of 11.6.  BMP was notable for mild hypokalemia at 3.2.  Lactate was 1.5.  COVID and influenza was negative.  Urinalysis was negative for any infection.  Patient received Rocephin IV in the ED, urine culture and blood culture was sent from the ED.  Was then considered for admission to hospital for further evaluation and treatment  Assessment and Plan  Principal Problem:   Altered mental status Active Problems:   MS (multiple sclerosis) (HCC)   Hypothyroidism   Weakness   Hypertension   Cellulitis of fifth toe of left foot   Sacral decubitus ulcer   Altered mental status, likely metabolic encephalopathy with underlying cognitive dysfunction- unclear etiology but low-grade fever and leukocytosis.  Urinalysis clear.  Chest x-ray without any  obvious infiltrate.  Patient does have new ulcer in the foot with surrounding cellulitis with some purulence which could be the etiology..  Will put the patient on IV antibiotics with vancomycin and cefepime., closely monitor.  Follow blood cultures urine culture.  Patient might need MRI of the foot/podiatry evaluation in AM.  Cognitive dysfunction.  At baseline she is able to recognize her family members.  Many issues and has been having some decline since Christmas.  Bilateral lower extremity erythema, left little toe ulcer with cellulitis and mild purulence.  X-ray negative for osteomyelitis but some cortical defect suggestive of possible fracture.  MRI might be able to characterize better due to overlying significant infection.  Will check C-reactive protein ESR first.  Might need podiatry evaluation in AM.  Cellulitis template utilized.  New sacral ulcer with erythema.  Stage I pressure ulceration.  CT scan of the abdomen pelvis without any acute findings.  No evidence of local soft tissue abscess.  Will get wound care evaluation.  Hypokalemia.  Mild. Will replenish.  Check levels in AM.  Will continue with IV fluids.  History of multiple sclerosis with mild cognitive dysfunction currently at the assisted nursing facility on wheelchair with limited use of legs.  Will get PT OT evaluation.  Essential hypertension Will closely monitor blood pressure. On metoprolol  Hypothyroidism Check TSH. On synthroid will continue   Debility weakness.  Patient is wheelchair-bound at the assisted living facility.  DVT Prophylaxis: Lovenox subcu  Review of Systems:  Review of system was limited due to patient's mental  condition. Spoke with the son for some information.   Past Medical History:  Diagnosis Date   Hypertension    MS (multiple sclerosis) ([place])    Osteopenia 05/2014   T score -1.8 FRAX 16%/4.7%   Thyroid disease    Hypothyroid   Trigeminal neuralgia    Past Surgical History:   Procedure Laterality Date   BACK SURGERY  2011   [place]   shattered elbow   feet surgery  1990   gamma knife  2005   radiofrquency rhizotomy  2002,2003   underarm surgery  1988    Social History:  reports that she has been smoking cigarettes. She has been smoking an average of 1 pack per day. She has never used smokeless tobacco. She reports current alcohol use. She reports that she does not use drugs.  Allergies  Allergen Reactions   Ciprofloxacin Other (See Comments)    Dizziness and headache   Codeine Other (See Comments)    GI upset   Erythromycin Other (See Comments)    Unknown    Family History  Problem Relation Age of Onset   Heart disease Mother    Hypertension Father    Cancer Father        colon   Stroke Father    Breast cancer Maternal Aunt 1   Breast cancer Cousin 40     Prior to Admission medications   Medication Sig Start Date End Date Taking? Authorizing Provider  albuterol (PROVENTIL HFA;VENTOLIN HFA) 108 (90 BASE) MCG/ACT inhaler Inhale 2 puffs into the lungs every 6 (six) hours as needed for wheezing. 08/06/15   Alycia Rossetti, MD  aspirin 81 MG tablet Take 81 mg by mouth daily.    [provider]  atorvastatin (LIPITOR) 10 MG tablet Take 10 mg by mouth daily.    [provider]  busPIRone (BUSPAR) 10 MG tablet Take 10 mg by mouth 2 (two) times daily. 04/15/21   [provider]  carbamazepine (TEGRETOL XR) 200 MG 12 hr tablet Take 1 tablet (200 mg total) by mouth 3 (three) times daily. 02/19/19   Eugenie Filler, MD  Cholecalciferol (VITAMIN D PO) Take 1,000 Units by mouth daily.     [provider]  cycloSPORINE (RESTASIS) 0.05 % ophthalmic emulsion Place 1 drop into both eyes 2 (two) times daily.    [provider]  feeding supplement, ENSURE ENLIVE, (ENSURE ENLIVE) LIQD Take 237 mLs by mouth 2 (two) times daily between meals. 02/19/19   Eugenie Filler, MD  fluticasone (FLONASE) 50  MCG/ACT nasal spray SPRAY 2 SPRAYS INTO EACH NOSTRIL EVERY DAY Patient taking differently: Place 2 sprays into both nostrils daily as needed for allergies. 06/25/18   [place], Modena Nunnery, MD  HYDROcodone-acetaminophen (NORCO) 7.5-325 MG tablet Take 1 tablet by mouth every 8 (eight) hours as needed for moderate pain. 02/19/19   Eugenie Filler, MD  hydrocortisone cream 1 % Apply 1 application topically 2 (two) times daily.    [provider]  hydrOXYzine (ATARAX/VISTARIL) 25 MG tablet Take 25 mg by mouth 3 (three) times daily as needed.    [provider]  ibuprofen (ADVIL) 400 MG tablet Take 1 tablet (400 mg total) by mouth every 6 (six) hours as needed for fever, headache or mild pain. 02/19/19   Eugenie Filler, MD  ipratropium (ATROVENT) 0.06 % nasal spray Place 1 spray into the nose daily as needed for rhinitis.  12/02/16   [provider]  levothyroxine (SYNTHROID) 75 MCG tablet Take 75 mcg by mouth daily before breakfast.    [provider]  metoprolol succinate (TOPROL-XL) 25 MG 24 hr tablet TAKE 1 TABLET BY MOUTH EVERY DAY Patient taking differently: Take 25 mg by mouth daily. 05/26/18   Salley Scarlet, MD  mirtazapine (REMERON) 7.5 MG tablet Take 7.5 mg by mouth at bedtime.    [provider]  Multiple Vitamin (MULTIVITAMIN) tablet Take 1 tablet by mouth daily.    [provider]  VUMERITY 231 MG CPDR TAKE 2 CAPSULES (462MG ) BY MOUTH TWICE DAILY. 06/05/22   Asa Lente, MD    Physical Exam: Vitals:   11-10-22 1227 2022/11/10 1229 November 10, 2022 1234 11/10/22 1434  BP:  (!) 113/58  107/63  Pulse:  96  87  Resp:  20  12  Temp:  99.9 F (37.7 C)    TempSrc:  Rectal    SpO2: 98% 100%  99%  Weight:   59 kg   Height:   5\' 2"  (1.575 m)    Wt Readings from Last 3 Encounters:  2022-11-10 59 kg  05/22/22 59 kg  11/08/20 61.2 kg   Body mass index is 23.78 kg/m.  General:  Average built, not in obvious distress, flexed posture,  elderly female, HENT: Normocephalic, No scleral pallor or icterus noted. Oral mucosa is dry Chest:  Clear breath sounds.. No crackles or wheezes.  CVS: S1 &S2 heard. No murmur.  Regular rate and rhythm. Abdomen: Soft, nontender, nondistended.  Bowel sounds are heard. No abdominal mass palpated Extremities: Bilateral lower extremity with erythema, increased warmth and thickened skin with some tenderness, with ulceration over the dorsal aspect of the left little toe with some purulence. Psych: Alert, awake, disoriented and confused. Minimally verbal at this time.  CNS: Confused and disoriented, occasionally agitated, moving all extremities, Skin: Warm and dry.  Stage I pressure ulceration-diffuse erythema over the bilateral buttocks with small superficial scabbed lesion.  Back  Left foot on 11/10/2022  Labs on Admission:   CBC: Recent Labs  Lab 10-Nov-2022 1233  WBC 12.5*  NEUTROABS 10.0*  HGB 11.6*  HCT 34.5*  MCV 91.5  PLT PLATELET CLUMPS NOTED ON SMEAR, COUNT APPEARS ADEQUATE    Basic Metabolic Panel: Recent Labs  Lab Nov 10, 2022 1233  NA 137  K 3.2*  CL 101  CO2 26  GLUCOSE 99  BUN 13  CREATININE 0.66  CALCIUM 8.6*    Liver Function Tests: Recent Labs  Lab 10-Nov-2022 1233  AST 28  ALT 18  ALKPHOS 56  BILITOT 0.5  PROT 8.1  ALBUMIN 3.7   No results for input(s): "LIPASE", "AMYLASE" in the last 168 hours. No results for input(s): "AMMONIA" in the last 168 hours.  Cardiac Enzymes: No results for input(s): "CKTOTAL", "CKMB", "CKMBINDEX", "TROPONINI" in the last 168 hours.  BNP (last 3 results) No results for input(s): "BNP" in the last 8760 hours.  ProBNP (last 3 results) No results for input(s): "PROBNP" in the last 8760 hours.  CBG: No results for input(s): "GLUCAP" in the last 168 hours.  Lipase  No results found for: "LIPASE"   Urinalysis    Component Value Date/Time   COLORURINE YELLOW November 10, 2022 1233   APPEARANCEUR CLEAR 2022/11/10 1233   LABSPEC  1.017 November 10, 2022 1233   PHURINE 6.0 November 10, 2022 1233   GLUCOSEU NEGATIVE Nov 10, 2022 1233   HGBUR NEGATIVE 2022/11/10 1233   BILIRUBINUR NEGATIVE 2022/11/10 1233   KETONESUR 5 (A) 11-10-22 1233   PROTEINUR NEGATIVE 2022-11-10 1233  UROBILINOGEN 0.2 09/15/2013 1605   NITRITE NEGATIVE November 05, 2022 1233   LEUKOCYTESUR NEGATIVE 11-05-2022 1233     Drugs of Abuse  No results found for: "LABOPIA", "COCAINSCRNUR", "LABBENZ", "AMPHETMU", "THCU", "LABBARB"    Radiological Exams on Admission: CT ABDOMEN PELVIS W CONTRAST  Result Date: 2022-11-05 CLINICAL DATA:  Sacral wound EXAM: CT ABDOMEN AND PELVIS WITH CONTRAST TECHNIQUE: Multidetector CT imaging of the abdomen and pelvis was performed using the standard protocol following bolus administration of intravenous contrast. RADIATION DOSE REDUCTION: This exam was performed according to the departmental dose-optimization program which includes automated exposure control, adjustment of the mA and/or kV according to patient size and/or use of iterative reconstruction technique. CONTRAST:  OMNIPAQUE IOHEXOL 300 MG/ML  SOLN COMPARISON:  None Available. FINDINGS: Lower chest: No acute abnormality Hepatobiliary: No focal hepatic abnormality. Gallbladder unremarkable. Pancreas: No focal abnormality or ductal dilatation. Spleen: No focal abnormality.  Normal size. Adrenals/Urinary Tract: No adrenal abnormality. No focal renal abnormality. No stones or hydronephrosis. Urinary bladder is unremarkable. Stomach/Bowel: Stomach, large and small bowel grossly unremarkable. Vascular/Lymphatic: Aortic atherosclerosis. No evidence of aneurysm or adenopathy. Reproductive: Uterus and adnexa unremarkable.  No mass. Other: No free fluid or free air. Musculoskeletal: Postoperative changes in the lower lumbar spine. No acute bony abnormality. No bone destruction in the sacrum/coccyx to suggest osteomyelitis. Subcutaneous calcifications within the gluteal regions bilaterally,  presumably related to chronic pressure or injection granulomas. No focal fluid collections to suggest abscess. IMPRESSION: No evidence of focal fluid collection or sacral/coccygeal osteo myelitis. No acute findings in the abdomen or pelvis. Electronically Signed   By: Charlett Nose M.D.   On: 11/05/2022 17:31   CT Head Wo Contrast  Result Date: 05-Nov-2022 CLINICAL DATA:  Mental status change, unknown cause EXAM: CT HEAD WITHOUT CONTRAST TECHNIQUE: Contiguous axial images were obtained from the base of the skull through the vertex without intravenous contrast. RADIATION DOSE REDUCTION: This exam was performed according to the departmental dose-optimization program which includes automated exposure control, adjustment of the mA and/or kV according to patient size and/or use of iterative reconstruction technique. COMPARISON:  02/14/2019 FINDINGS: Brain: There is atrophy and chronic small vessel disease changes. No acute intracranial abnormality. Specifically, no hemorrhage, hydrocephalus, mass lesion, acute infarction, or significant intracranial injury. Vascular: No hyperdense vessel or unexpected calcification. Skull: No acute calvarial abnormality. Sinuses/Orbits: No acute findings Other: None IMPRESSION: Atrophy, chronic microvascular disease. No acute intracranial abnormality. Electronically Signed   By: Charlett Nose M.D.   On: 05-Nov-2022 17:27   VAS [place] LOWER EXTREMITY VENOUS (DVT) (ONLY [facility] & [facility])  Result Date: 11/05/2022  Lower Venous DVT Study Patient Name:  Melanie Richardson  Date of Exam:   2022/11/05 Medical Rec #: [id number]         Accession #:    [id number] Date of Birth: 1951/02/10          Patient Gender: F Patient Age:   74 years Exam Location:  [facility] Procedure:      VAS [place] LOWER EXTREMITY VENOUS (DVT) Referring Phys: [facility] [place] --------------------------------------------------------------------------------  Indications: Swelling, and Erythema.  Limitations: Quality of study limited  due to patient's innability to cooperate (movement and poor positioning) due to altered mental status. Comparison Study: No previous exams Performing Technologist: Jody Hill RVT, RDMS  Examination Guidelines: A complete evaluation includes B-mode imaging, spectral Doppler, color Doppler, and power Doppler as needed of all accessible portions of each vessel. Bilateral testing is considered an integral part of a complete examination.  Limited examinations for reoccurring indications may be performed as noted. The reflux portion of the exam is performed with the patient in reverse Trendelenburg.  +---------+---------------+---------+-----------+----------+--------------+ RIGHT    CompressibilityPhasicitySpontaneityPropertiesThrombus Aging +---------+---------------+---------+-----------+----------+--------------+ CFV      Full           Yes      Yes                                 +---------+---------------+---------+-----------+----------+--------------+ SFJ      Full                                                        +---------+---------------+---------+-----------+----------+--------------+ FV Prox  Full           Yes      Yes                                 +---------+---------------+---------+-----------+----------+--------------+ FV Mid   Full           Yes      Yes                                 +---------+---------------+---------+-----------+----------+--------------+ FV DistalFull           Yes      Yes                                 +---------+---------------+---------+-----------+----------+--------------+ PFV      Full                                                        +---------+---------------+---------+-----------+----------+--------------+ POP      Full           Yes      Yes                                 +---------+---------------+---------+-----------+----------+--------------+ PTV      Full                                                         +---------+---------------+---------+-----------+----------+--------------+ PERO     Full                                                        +---------+---------------+---------+-----------+----------+--------------+   +---------+---------------+---------+-----------+----------+--------------+ LEFT     CompressibilityPhasicitySpontaneityPropertiesThrombus Aging +---------+---------------+---------+-----------+----------+--------------+ CFV      Full           Yes      Yes                                 +---------+---------------+---------+-----------+----------+--------------+  SFJ      Full                                                        +---------+---------------+---------+-----------+----------+--------------+ FV Prox  Full           Yes      Yes                                 +---------+---------------+---------+-----------+----------+--------------+ FV Mid   Full           Yes      Yes                                 +---------+---------------+---------+-----------+----------+--------------+ FV DistalFull           Yes      Yes                                 +---------+---------------+---------+-----------+----------+--------------+ PFV      Full                                                        +---------+---------------+---------+-----------+----------+--------------+ POP      Full           Yes      Yes                                 +---------+---------------+---------+-----------+----------+--------------+ PTV                                                   not visualized +---------+---------------+---------+-----------+----------+--------------+ PERO                                                  not visualized +---------+---------------+---------+-----------+----------+--------------+   Left Technical Findings: Not visualized segments include Peroneal and posterior tibial veins.    Summary: BILATERAL: -No evidence of popliteal cyst, bilaterally. RIGHT: - There is no evidence of deep vein thrombosis in the lower extremity.  - Ultrasound characteristics of enlarged lymph nodes are noted in the groin.  LEFT: - There is no evidence of deep vein thrombosis in the lower extremity. However, portions of this examination were limited- see technologist comments above.  - Ultrasound characteristics of enlarged lymph nodes noted in the groin.  *See table(s) above for measurements and observations.    Preliminary    DG Foot Complete Left  Result Date: 10/14/2022 CLINICAL DATA:  ulceration left little toe EXAM: LEFT FOOT - COMPLETE 3+ VIEW COMPARISON:  None Available. FINDINGS: Evaluation is limited by osteopenia. Query cortical offset of the head of the fifth metatarsal. Otherwise no  definitive acute fracture or dislocation. Degenerative changes throughout the toes. No area of erosion or osseous destruction. No unexpected radiopaque foreign body. Soft tissue edema. Vascular calcifications. IMPRESSION: 1. Query cortical offset of the head of the fifth metatarsal. This may reflect a minimally displaced fracture. Recommend correlation with point tenderness. 2.  No definitive radiographic evidence of osteomyelitis. Electronically Signed   By: Valentino Saxon M.D.   On: 11-16-22 13:23   DG Chest Port 1 View  Result Date: 11/16/2022 CLINICAL DATA:  Questionable sepsis - evaluate for abnormality EXAM: PORTABLE CHEST 1 VIEW COMPARISON:  Feb 10, 2017 FINDINGS: Evaluation is limited by patient positioning. Incomplete assessment of the LEFT apex laterally. The cardiomediastinal silhouette is grossly similar in contour given patient rotation. No pleural effusion. No pneumothorax. No acute pleuroparenchymal abnormality. Bilateral soft tissue calcifications of the arms IMPRESSION: 1. No acute cardiopulmonary abnormality within the limitations of this rotated exam. If there is clinical concern for mediastinal  pathology, recommend repeat PA and lateral chest radiograph. 2. Bilateral tissue calcifications of the arms, possibly sequela of prior trauma. Recommend correlation with physical exam. Electronically Signed   By: Valentino Saxon M.D.   On: 11-16-2022 13:18    EKG: Personally reviewed by me which shows normal sinus rhythm   Consultant: None  Code Status: DNR, I spoke with the patient's son about this.  Microbiology blood culture and urine culture sent from the ED  Antibiotics: Vancomycin and cefepime for purulent cellulitis.  Family Communication:  Patients' condition and plan of care including tests being ordered have been discussed with the patient and the patient's son in detail who indicate understanding and agree with the plan.   Status is: Observation The patient remains OBS appropriate and will d/c before 2 midnights.   Severity of Illness: The appropriate patient status for this patient is OBSERVATION. Observation status is judged to be reasonable and necessary in order to provide the required intensity of service to ensure the patient's safety. The patient's presenting symptoms, physical exam findings, and initial radiographic and laboratory data in the context of their medical condition is felt to place them at decreased risk for further clinical deterioration. Furthermore, it is anticipated that the patient will be medically stable for discharge from the hospital within 2 midnights of admission.   Signed, Flora Lipps, MD [facility] Nov 16, 2022

## 2022-10-19 NOTE — ED Provider Notes (Signed)
Accepted handoff at shift change from [facility]. Please see prior provider note for more detail.   Briefly: Patient is 72 y.o.   DDX: concern for 72yo female, hx of MS, some cognitive impairment. Dx with UTI around [place], was not treated 2/2 abx availability. Moderately combative.  Patient without complaints, ulcer on left foot is new. Stasis dermatitis bilaterally. Pending [place] of BIL LE, UA, inflammatory markers, imaging. Small sacral ulcer also noted. Likely admit for AMS regardless of source, suspicious for urine however  Plan: Independently interpreted urinalysis which shows no evidence of acute urinary infection.  I independently interpreted imaging including bilateral lower extremity vascular ultrasound, CT abdomen pelvis, CT head without contrast which shows no evidence of acute abnormality throughout, no evidence of acute DVT. I agree with the radiologist interpretation.  Patient with on my physical exam some clear signs of cellulitis secondary to foot ulcer, with concern for possible developing osteomyelitis.  Overall clinical picture shows acute altered mental status, developing fever, with concern for lower extremity wounds.  I consulted with the hospitalist, spoke with Dr. Louanne Belton who after reviewing patient's clinical presentation and lab work agrees to admission at this time.      RISR  EDTHIS    Anselmo Pickler, PA-C 2022/10/31 1811    Varney Biles, MD 10/20/22 1615

## 2022-10-19 NOTE — ED Notes (Signed)
Attempted to change pt, pt refused to be changed properly. Pt also refused to let us change their gown.

## 2022-10-19 NOTE — ED Notes (Signed)
Hospitalist notified of pt. Rectal temp of 102.7

## 2022-10-19 NOTE — ED Triage Notes (Signed)
Patient brought in by EMS from [place] from [place]. Per EMS Patient was DX with UTI and has not started on ABX. She has new wound to L small toe.  146/66 80 98% RA CBG:119

## 2022-10-19 NOTE — ED Notes (Signed)
Ice packs applied under pt's arms and on pt's back to lower body temp.

## 2022-10-19 NOTE — ED Notes (Signed)
Nurse obtained patient temp and got 100.9 Axillary and 102.0 Rectally MD aware

## 2022-10-19 NOTE — ED Provider Notes (Cosign Needed)
[facility]-EMERGENCY DEPT Provider Note   CSN: [id number] Arrival date & time: 2022/11/16  1219     History  Chief Complaint  Patient presents with   Altered Mental Status    Melanie Richardson is a 72 y.o. female hypertension with past medical history hypertension, hypothyroidism, MS, unspecified cognitive impairment per EMS who presents to the ED today from her nursing facility for increasing altered mental status.  Patient unable to provide any history secondary to clinical condition and history of cognitive impairment so entirety of history obtained from EMS.  They state that per facility patient was diagnosed with a UTI around Christmas of 2023, however, they were unable to obtain the antibiotics prescribed and thus patient has not been treated for this UTI.  No reported fever, nausea, vomiting.  Vital signs stable by EMS.  Patient reports that she has no complaints including abdominal pain, chest pain, nausea, vomiting, diarrhea, weakness, headache, or dysuria.  EMS does state that patient's skin changes and swelling to both lower extremities in addition to an ulceration to the left fifth toe are new per facility staff and have been ongoing for approximately 2 weeks now. No more history able to be obtained at this time.       Home Medications Prior to Admission medications   Medication Sig Start Date End Date Taking? Authorizing Provider  albuterol (PROVENTIL HFA;VENTOLIN HFA) 108 (90 BASE) MCG/ACT inhaler Inhale 2 puffs into the lungs every 6 (six) hours as needed for wheezing. 08/06/15   Salley Scarlet, MD  aspirin 81 MG tablet Take 81 mg by mouth daily.    [provider]  atorvastatin (LIPITOR) 10 MG tablet Take 10 mg by mouth daily.    [provider]  busPIRone (BUSPAR) 10 MG tablet Take 10 mg by mouth 2 (two) times daily. 04/15/21   [provider]  carbamazepine (TEGRETOL XR) 200 MG 12 hr tablet Take 1 tablet (200 mg total) by  mouth 3 (three) times daily. 02/19/19   Rodolph Bong, MD  Cholecalciferol (VITAMIN D PO) Take 1,000 Units by mouth daily.     [provider]  cycloSPORINE (RESTASIS) 0.05 % ophthalmic emulsion Place 1 drop into both eyes 2 (two) times daily.    [provider]  feeding supplement, ENSURE ENLIVE, (ENSURE ENLIVE) LIQD Take 237 mLs by mouth 2 (two) times daily between meals. 02/19/19   Rodolph Bong, MD  fluticasone (FLONASE) 50 MCG/ACT nasal spray SPRAY 2 SPRAYS INTO EACH NOSTRIL EVERY DAY Patient taking differently: Place 2 sprays into both nostrils daily as needed for allergies. 06/25/18   [place], Velna Hatchet, MD  HYDROcodone-acetaminophen (NORCO) 7.5-325 MG tablet Take 1 tablet by mouth every 8 (eight) hours as needed for moderate pain. 02/19/19   Rodolph Bong, MD  hydrocortisone cream 1 % Apply 1 application topically 2 (two) times daily.    [provider]  hydrOXYzine (ATARAX/VISTARIL) 25 MG tablet Take 25 mg by mouth 3 (three) times daily as needed.    [provider]  ibuprofen (ADVIL) 400 MG tablet Take 1 tablet (400 mg total) by mouth every 6 (six) hours as needed for fever, headache or mild pain. 02/19/19   Rodolph Bong, MD  ipratropium (ATROVENT) 0.06 % nasal spray Place 1 spray into the nose daily as needed for rhinitis.  12/02/16   [provider]  levothyroxine (SYNTHROID) 75 MCG tablet Take 75 mcg by mouth daily before breakfast.    [provider]  metoprolol succinate (TOPROL-XL) 25 MG 24 hr tablet TAKE 1 TABLET BY MOUTH EVERY DAY Patient taking differently: Take 25 mg by mouth daily. 05/26/18   Alycia Rossetti, MD  mirtazapine (REMERON) 7.5 MG tablet Take 7.5 mg by mouth at bedtime.    [provider]  Multiple Vitamin (MULTIVITAMIN) tablet Take 1 tablet by mouth daily.    [provider]  VUMERITY 231 MG CPDR TAKE 2 CAPSULES (462MG ) BY MOUTH TWICE DAILY. 06/05/22   Sater, Nanine Means, MD       Allergies    Ciprofloxacin, Codeine, and Erythromycin    Review of Systems   Review of Systems  Unable to perform ROS: Mental status change   Please see HPI for ROS able to be obtained.   Physical Exam Updated Vital Signs BP 107/63 (BP Location: Right Arm)   Pulse 87   Temp 99.9 F (37.7 C) (Rectal)   Resp 12   Ht 5\' 2"  (1.575 m)   Wt 59 kg   LMP 10/06/1996   SpO2 99%   BMI 23.78 kg/m  Physical Exam Vitals and nursing note reviewed. Exam conducted with a chaperone present.  Constitutional:      General: She is not in acute distress.    Appearance: She is not toxic-appearing or diaphoretic.  HENT:     Head: Normocephalic and atraumatic.     Nose: Nose normal.     Mouth/Throat:     Mouth: Mucous membranes are moist.  Eyes:     General: No scleral icterus.    Extraocular Movements: Extraocular movements intact.     Conjunctiva/sclera: Conjunctivae normal.     Pupils: Pupils are equal, round, and reactive to light.  Cardiovascular:     Rate and Rhythm: Normal rate and regular rhythm.     Heart sounds: Normal heart sounds. No murmur heard. Pulmonary:     Effort: Pulmonary effort is normal. No respiratory distress.     Breath sounds: Normal breath sounds. No stridor. No wheezing, rhonchi or rales.  Chest:     Chest wall: No tenderness.  Abdominal:     General: Abdomen is flat. There is no distension.     Palpations: Abdomen is soft.     Tenderness: There is no abdominal tenderness. There is no guarding or rebound.  Musculoskeletal:        General: Normal range of motion.     Cervical back: Normal range of motion and neck supple. No rigidity or tenderness.     Comments: Bilateral lower extremities with erythema, increased warmth, and thickened skin up to approximately 2 inches below level of the knee on both sides most likely consistent with stasis dermatitis, no calf or tib-fib tenderness, 1+ DP and PT pulse bilaterally, non-pitting edema bilaterally but worse on the  left, scabbed over ulceration to dorsal aspect of left little toe, non-tender, no active drainage  Skin:    General: Skin is warm and dry.     Capillary Refill: Capillary refill takes less than 2 seconds.     Comments: Diffuse erythema over bilateral buttocks, small scabbed over ulceration over sacrum that appears well-healed, soft and non-tender with palpation, no drainage or increased warmth  Neurological:     General: No focal deficit present.     Mental Status: She is disoriented and confused.     GCS: GCS eye subscore is 4. GCS verbal subscore is 5. GCS motor subscore is 6.     Cranial Nerves: Cranial nerves 2-12  are intact.     Sensory: Sensation is intact.     Motor: No weakness or abnormal muscle tone.  Psychiatric:        Mood and Affect: Affect is blunt.        Speech: Speech normal. Speech is not slurred.        Behavior: Behavior is agitated.     ED Results / Procedures / Treatments   Labs (all labs ordered are listed, but only abnormal results are displayed) Labs Reviewed  COMPREHENSIVE METABOLIC PANEL - Abnormal; Notable for the following components:      Result Value   Potassium 3.2 (*)    Calcium 8.6 (*)    All other components within normal limits  CBC WITH DIFFERENTIAL/PLATELET - Abnormal; Notable for the following components:   WBC 12.5 (*)    RBC 3.77 (*)    Hemoglobin 11.6 (*)    HCT 34.5 (*)    Neutro Abs 10.0 (*)    All other components within normal limits  CULTURE, BLOOD (ROUTINE X 2)  CULTURE, BLOOD (ROUTINE X 2)  URINE CULTURE  RESP PANEL BY RT-PCR (RSV, FLU A&B, COVID)  RVPGX2  LACTIC ACID, PLASMA  PROTIME-INR  APTT  LACTIC ACID, PLASMA  URINALYSIS, ROUTINE W REFLEX MICROSCOPIC  SEDIMENTATION RATE  C-REACTIVE PROTEIN    EKG EKG Interpretation  Date/Time:  10-29-22 14:16:40 EST Ventricular Rate:  86 PR Interval:  175 QRS Duration: 92 QT Interval:  384 QTC Calculation: 460 R Axis:   82 Text Interpretation: Sinus rhythm  Borderline right axis deviation Nonspecific repolarization abnormalities No significant change since last tracing Confirmed by [place] ([id number]) on 2022/10/29 2:44:20 PM  Radiology DG Foot Complete Left  Result Date: 29-Oct-2022 CLINICAL DATA:  ulceration left little toe EXAM: LEFT FOOT - COMPLETE 3+ VIEW COMPARISON:  None Available. FINDINGS: Evaluation is limited by osteopenia. Query cortical offset of the head of the fifth metatarsal. Otherwise no definitive acute fracture or dislocation. Degenerative changes throughout the toes. No area of erosion or osseous destruction. No unexpected radiopaque foreign body. Soft tissue edema. Vascular calcifications. IMPRESSION: 1. Query cortical offset of the head of the fifth metatarsal. This may reflect a minimally displaced fracture. Recommend correlation with point tenderness. 2.  No definitive radiographic evidence of osteomyelitis. Electronically Signed   By: Valentino Saxon M.D.   On: 2022/10/29 13:23   DG Chest Port 1 View  Result Date: 2022/10/29 CLINICAL DATA:  Questionable sepsis - evaluate for abnormality EXAM: PORTABLE CHEST 1 VIEW COMPARISON:  Feb 10, 2017 FINDINGS: Evaluation is limited by patient positioning. Incomplete assessment of the LEFT apex laterally. The cardiomediastinal silhouette is grossly similar in contour given patient rotation. No pleural effusion. No pneumothorax. No acute pleuroparenchymal abnormality. Bilateral soft tissue calcifications of the arms IMPRESSION: 1. No acute cardiopulmonary abnormality within the limitations of this rotated exam. If there is clinical concern for mediastinal pathology, recommend repeat PA and lateral chest radiograph. 2. Bilateral tissue calcifications of the arms, possibly sequela of prior trauma. Recommend correlation with physical exam. Electronically Signed   By: Valentino Saxon M.D.   On: 2022-10-29 13:18    Procedures Procedures    Medications Ordered in ED Medications  potassium  chloride (KLOR-CON M) CR tablet 30 mEq (30 mEq Oral Given 10-29-22 1357)  LORazepam (ATIVAN) injection 0.5 mg (0.5 mg Intravenous Given 29-Oct-2022 1506)    ED Course/ Medical Decision Making/ A&P  Medical Decision Making Amount and/or Complexity of Data Reviewed Labs: ordered. Decision-making details documented in ED Course. Radiology: ordered. Decision-making details documented in ED Course. ECG/medicine tests: ordered. Decision-making details documented in ED Course.  Risk Prescription drug management. Decision regarding hospitalization.  1305 -- Patient too agitated to undergo CT scan. She has a non-focal neurological exam and last normal at a minimum several days ago but likely weeks. With AMS, will not proceed with sedating medications at this time and continue with remainder of workup to look for underlying source of infection that may be attributing to pt's AMS.  1337 -- Resting comfortably, agitation resolved. Vital signs stable. Re-examined foot with indeterminate displaced fracture of fifth metatarsal of left foot. Digit and area surrounding ulceration remain non-tender. Pt denies any recent fall or injury to foot.   1500: Second attempt at CT scan unsuccessful due to combativeness. With pt's AMS, will add small dose of Ativan in attempt to get scan and IO cath for UA evaluation.  This is a 72 year old female presenting to ED for altered mental status. Per EMS report, pt has been worsening since around Christmas when she was diagnosed with UTI but living facility was unable to provide her with antibiotics recommended. Pt disoriented on exam, intermittently combative, and denies having any complaints. With untreated UTI and AMS, proceeded with sepsis workup with suspicion for urinary source. Currently afebrile, no increase in respiratory requirement, and one episode of heart rate of 96 but with mild leukocytosis does meet SIRS criteria. Did not start on empiric  antibiotic therapy while awaiting source confirmation as pt had only one episode of HR > 90 and otherwise only met criterion is the leukocytosis at 12.5 and not entirely clear what we are treating at this time. Mild hypokalemia that was repleted. Pt does have new ulceration to left fifth toe with stasis dermatitis changes to lower extremities, lower suspicion for cellulitis but should remain on differential if no other source identified. Low suspicion for acute DVT but extremities are unequal in size and pt unreliable historian so vascular ultrasound ordered for further evaluation. Osteomyelitis in differential as well. Left foot XR did not show this. Concern for acute left fifth toe dislocation though recommended correlation with exam and pt has no point tenderness and denies any acute trauma. Pending inflammatory markers but if no other source identified would consider further workup for osteomyelitis should these return elevated. Also has well-appearing, healing sacral ulcer. Non-tender on exam with no palpable pocket to suspect underlying abscess but should another source not be identified this is a consideration. With pt unable to sit for CT scan x 2, small dose of Ativan ordered to help obtain imaging. Added CT AP for further evaluation due to pt's combativeness in hopes she will only need to sit for scan once and will not require re-dosing of sedative medications in setting of AMS. Inflammatory markers, UA, CT pending at shift change so care transitioned to oncoming PA, [employer], pending remainder of workup and disposition with high suspicion pt will require inpatient admission. Stable at time of transfer of care.         Final Clinical Impression(s) / ED Diagnoses Final diagnoses:  Hypokalemia  Altered mental status, unspecified altered mental status type  Agitation    Rx / DC Orders ED Discharge Orders     None         Tonette Lederer, PA-C 10/26/2022 1531    Katja Blue,  Quinta Eimer L, PA-C 10/20/22 1230  Glendora Score, MD 10/20/22 [phone]

## 2022-10-19 NOTE — Progress Notes (Signed)
Pharmacy Antibiotic Note  Melanie Richardson is a 73 y.o. female admitted on 10/27/2022 with cellulitis.  Pharmacy has been consulted for cefepime and vancomycin dosing for 7 days.  In ED: cefepime 2 g IV and vancomycin 1 g IV administered  Plan: Continue cefepime 2 g IV every 12 hours x 7 days Continue vancomycin 1000 mg IV every 24 hours (Goal AUC 400-550, eAUC 503.6, SCr used: 0.8)  x 7 days Monitor clinical progress, renal function, vancomycin levels as indicated F/U C&S, abx deescalation / LOT   Height: 5\' 2"  (157.5 cm) Weight: 59 kg (130 lb) IBW/kg (Calculated) : 50.1  Temp (24hrs), Avg:99.9 F (37.7 C), Min:99.9 F (37.7 C), Max:99.9 F (37.7 C)  Recent Labs  Lab 10/14/2022 1233  WBC 12.5*  CREATININE 0.66  LATICACIDVEN 1.5    Estimated Creatinine Clearance: 51 mL/min (by C-G formula based on SCr of 0.66 mg/dL).    Allergies  Allergen Reactions   Ciprofloxacin Other (See Comments)    Dizziness and headache   Codeine Other (See Comments)    GI upset   Erythromycin Other (See Comments)    Unknown    Antimicrobials this admission: 1/14 Rocephin x 1 1/14 Cefepime >>  1/14 Vancomycin >>  Microbiology results: 1/14 BCx: sent 1/14 UCx: sent    Thank you for allowing pharmacy to be a part of this patient's care.  Royetta Asal, PharmD, BCPS Clinical Pharmacist [facility] Please utilize Amion for appropriate phone number to reach the unit pharmacist ([place]) 10/29/2022 6:36 PM

## 2022-10-20 DIAGNOSIS — A419 Sepsis, unspecified organism: Secondary | ICD-10-CM | POA: Insufficient documentation

## 2022-10-20 DIAGNOSIS — R451 Restlessness and agitation: Secondary | ICD-10-CM | POA: Diagnosis present

## 2022-10-20 DIAGNOSIS — Z515 Encounter for palliative care: Secondary | ICD-10-CM | POA: Diagnosis not present

## 2022-10-20 DIAGNOSIS — R5381 Other malaise: Secondary | ICD-10-CM | POA: Diagnosis not present

## 2022-10-20 DIAGNOSIS — Z993 Dependence on wheelchair: Secondary | ICD-10-CM | POA: Diagnosis not present

## 2022-10-20 DIAGNOSIS — R531 Weakness: Secondary | ICD-10-CM

## 2022-10-20 DIAGNOSIS — F17210 Nicotine dependence, cigarettes, uncomplicated: Secondary | ICD-10-CM | POA: Diagnosis present

## 2022-10-20 DIAGNOSIS — L03032 Cellulitis of left toe: Secondary | ICD-10-CM | POA: Diagnosis not present

## 2022-10-20 DIAGNOSIS — G510 Bell's palsy: Secondary | ICD-10-CM | POA: Diagnosis present

## 2022-10-20 DIAGNOSIS — G9341 Metabolic encephalopathy: Secondary | ICD-10-CM | POA: Diagnosis present

## 2022-10-20 DIAGNOSIS — M86672 Other chronic osteomyelitis, left ankle and foot: Secondary | ICD-10-CM | POA: Diagnosis present

## 2022-10-20 DIAGNOSIS — L97522 Non-pressure chronic ulcer of other part of left foot with fat layer exposed: Secondary | ICD-10-CM | POA: Diagnosis present

## 2022-10-20 DIAGNOSIS — I872 Venous insufficiency (chronic) (peripheral): Secondary | ICD-10-CM | POA: Diagnosis present

## 2022-10-20 DIAGNOSIS — L03116 Cellulitis of left lower limb: Secondary | ICD-10-CM | POA: Diagnosis not present

## 2022-10-20 DIAGNOSIS — Z7189 Other specified counseling: Secondary | ICD-10-CM | POA: Diagnosis not present

## 2022-10-20 DIAGNOSIS — M869 Osteomyelitis, unspecified: Secondary | ICD-10-CM | POA: Diagnosis not present

## 2022-10-20 DIAGNOSIS — L89151 Pressure ulcer of sacral region, stage 1: Secondary | ICD-10-CM

## 2022-10-20 DIAGNOSIS — F419 Anxiety disorder, unspecified: Secondary | ICD-10-CM | POA: Diagnosis not present

## 2022-10-20 DIAGNOSIS — G35 Multiple sclerosis: Secondary | ICD-10-CM

## 2022-10-20 DIAGNOSIS — I10 Essential (primary) hypertension: Secondary | ICD-10-CM | POA: Diagnosis present

## 2022-10-20 DIAGNOSIS — E440 Moderate protein-calorie malnutrition: Secondary | ICD-10-CM | POA: Diagnosis present

## 2022-10-20 DIAGNOSIS — Z7982 Long term (current) use of aspirin: Secondary | ICD-10-CM | POA: Diagnosis not present

## 2022-10-20 DIAGNOSIS — Z1152 Encounter for screening for COVID-19: Secondary | ICD-10-CM | POA: Diagnosis not present

## 2022-10-20 DIAGNOSIS — E876 Hypokalemia: Secondary | ICD-10-CM | POA: Diagnosis present

## 2022-10-20 DIAGNOSIS — F0390 Unspecified dementia without behavioral disturbance: Secondary | ICD-10-CM | POA: Diagnosis present

## 2022-10-20 DIAGNOSIS — M86172 Other acute osteomyelitis, left ankle and foot: Secondary | ICD-10-CM | POA: Diagnosis not present

## 2022-10-20 DIAGNOSIS — E039 Hypothyroidism, unspecified: Secondary | ICD-10-CM | POA: Diagnosis present

## 2022-10-20 DIAGNOSIS — Z66 Do not resuscitate: Secondary | ICD-10-CM | POA: Diagnosis present

## 2022-10-20 DIAGNOSIS — L89152 Pressure ulcer of sacral region, stage 2: Secondary | ICD-10-CM | POA: Diagnosis present

## 2022-10-20 LAB — CBC
HCT: 30.3 % — ABNORMAL LOW (ref 36.0–46.0)
Hemoglobin: 10.3 g/dL — ABNORMAL LOW (ref 12.0–15.0)
MCH: 30.7 pg (ref 26.0–34.0)
MCHC: 34 g/dL (ref 30.0–36.0)
MCV: 90.4 fL (ref 80.0–100.0)
Platelets: 288 10*3/uL (ref 150–400)
RBC: 3.35 MIL/uL — ABNORMAL LOW (ref 3.87–5.11)
RDW: 12.9 % (ref 11.5–15.5)
WBC: 8.3 10*3/uL (ref 4.0–10.5)
nRBC: 0 % (ref 0.0–0.2)

## 2022-10-20 LAB — COMPREHENSIVE METABOLIC PANEL
ALT: 15 U/L (ref 0–44)
AST: 23 U/L (ref 15–41)
Albumin: 2.8 g/dL — ABNORMAL LOW (ref 3.5–5.0)
Alkaline Phosphatase: 40 U/L (ref 38–126)
Anion gap: 10 (ref 5–15)
BUN: 7 mg/dL — ABNORMAL LOW (ref 8–23)
CO2: 22 mmol/L (ref 22–32)
Calcium: 7.5 mg/dL — ABNORMAL LOW (ref 8.9–10.3)
Chloride: 100 mmol/L (ref 98–111)
Creatinine, Ser: 0.58 mg/dL (ref 0.44–1.00)
GFR, Estimated: >60 mL/min (ref >60)
Glucose, Bld: 114 mg/dL — ABNORMAL HIGH (ref 70–99)
Potassium: 2.5 mmol/L — CL (ref 3.5–5.1)
Sodium: 132 mmol/L — ABNORMAL LOW (ref 135–145)
Total Bilirubin: 0.9 mg/dL (ref 0.3–1.2)
Total Protein: 6.2 g/dL — ABNORMAL LOW (ref 6.5–8.1)

## 2022-10-20 LAB — URINE CULTURE: Culture: NO GROWTH

## 2022-10-20 LAB — SEDIMENTATION RATE: Sed Rate: 18 mm/hr (ref 0–22)

## 2022-10-20 LAB — MAGNESIUM: Magnesium: 1.1 mg/dL — ABNORMAL LOW (ref 1.7–2.4)

## 2022-10-20 LAB — TSH: TSH: 5.589 u[IU]/mL — ABNORMAL HIGH (ref 0.350–4.500)

## 2022-10-20 MED ORDER — HALOPERIDOL LACTATE 5 MG/ML IJ SOLN
1.0000 mg | Freq: Four times a day (QID) | INTRAMUSCULAR | Status: DC | PRN
  Administered 2022-10-20 – 2022-10-22 (×3): 1 mg via INTRAVENOUS
  Filled 2022-10-20 (×3): qty 1

## 2022-10-20 MED ORDER — HALOPERIDOL LACTATE 5 MG/ML IJ SOLN: 1.0000 mg | Freq: Once | INTRAMUSCULAR | Status: DC | PRN

## 2022-10-20 MED ORDER — METOPROLOL TARTRATE 5 MG/5ML IV SOLN
2.5000 mg | INTRAVENOUS | Status: DC | PRN
  Administered 2022-10-23: 2.5 mg via INTRAVENOUS
  Filled 2022-10-20: qty 5

## 2022-10-20 MED ORDER — POTASSIUM CHLORIDE CRYS ER 20 MEQ PO TBCR
40.0000 meq | EXTENDED_RELEASE_TABLET | Freq: Once | ORAL | Status: DC
  Filled 2022-10-20: qty 2

## 2022-10-20 MED ORDER — SODIUM CHLORIDE 0.9 % IV SOLN
2.0000 g | INTRAVENOUS | Status: DC
  Administered 2022-10-20 – 2022-10-26 (×7): 2 g via INTRAVENOUS
  Filled 2022-10-20 (×7): qty 20

## 2022-10-20 MED ORDER — MEDIHONEY WOUND/BURN DRESSING EX PSTE
1.0000 | PASTE | Freq: Every day | CUTANEOUS | Status: DC
  Administered 2022-10-20 – 2022-10-27 (×7): 1 via TOPICAL
  Filled 2022-10-20 (×2): qty 44

## 2022-10-20 MED ORDER — MAGNESIUM SULFATE 4 GM/100ML IV SOLN
4.0000 g | Freq: Once | INTRAVENOUS | Status: AC
  Administered 2022-10-20: 4 g via INTRAVENOUS
  Filled 2022-10-20: qty 100

## 2022-10-20 MED ORDER — POTASSIUM CHLORIDE 10 MEQ/100ML IV SOLN
10.0000 meq | INTRAVENOUS | Status: AC
  Administered 2022-10-20 (×6): 10 meq via INTRAVENOUS
  Filled 2022-10-20 (×6): qty 100

## 2022-10-20 NOTE — Progress Notes (Signed)
PT Cancellation Note  Patient Details Name: Melanie Richardson MRN: [id number] DOB: 1951/04/01   Cancelled Treatment:    Reason Eval/Treat Not Completed:  Per chart review/staff note, pt is not currently cooperating with attempts at treatment. Will cancel PT today and check back another day-hopefully pt will become more participatory.    [facility]  Office: [phone]

## 2022-10-20 NOTE — ED Notes (Signed)
Pt self removed iv. Linen and brief changed, new placed. Mittens applied.

## 2022-10-20 NOTE — Progress Notes (Signed)
OT Cancellation Note  Patient Details Name: BRITTA LOUTH MRN: [id number] DOB: 1951-08-17   Cancelled Treatment:    Reason Eval/Treat Not Completed: Patient not medically ready Patient noted to have critical potassium level 2.5 and patient noted to no be cooperative with nursing per chart review. OT to continue to follow and check back as schedule will allow.  Rennie Plowman, [facility] Office# [phone]  10/20/2022, 9:52 AM

## 2022-10-20 NOTE — Progress Notes (Signed)
PROGRESS NOTE    Melanie Richardson  JEH:[id number] DOB: 12/22/1950 DOA: 2022-11-11 PCP: Patient, No Pcp Per    Brief Narrative:  Patient is a 72 years old female with history of some cognitive impairment, multiple sclerosis, hypertension, hypothyroidism, wheelchair-bound status, presented to hospital from assisted living facility with confusion, disorientation, mild agitation at the facility.  There was some mention of redness swelling and tenderness of the bilateral lower extremities with an ulceration on the left fifth toe which was new for the last 2 weeks or so.  In the ED, patient was moderately combative.  Had low-grade fever.  She was noted to have new ulceration on the foot.  Small sacral ulcer was also noted.  Vitals was notable for temperature of 99.9 F.  Laboratory data showed WBC elevated at 12.5 with hemoglobin of 11.6.  BMP was notable for mild hypokalemia at 3.2.  Lactate was 1.5.  COVID and influenza was negative.  Urinalysis was negative for any infection.  Patient received Rocephin IV in the ED, urine culture and blood culture was sent from the ED. patient was then considered for admission to hospital for further evaluation and treatment   Assessment and Plan:  Altered mental status, likely metabolic encephalopathy with underlying cognitive dysfunction-likely secondary to foot infection.  Not much improvement today.  Chest x-ray without any obvious infiltrate.  Continue antibiotics with vancomycin and change cefepime to ceftriaxone., closely monitor.  Follow blood cultures, urine culture.  Unsafe for swallowing.  Speech therapy has been consulted.   Sepsis secondary to bilateral lower extremity erythema, left little toe ulcer with cellulitis and mild purulence.  Patient has exhibited signs of sepsis including fever, tachycardia tachypnea with foot infection.  She does have encephalopathy as well.  X-ray foot suggestive of some cortical defect suggestive of possible fracture.  Spoke  with podiatry for consultation.  Will get MRI of the foot for better characterization.  C-reactive protein ESR pending.  Temperature max of 102.7 F with tachycardia.  Cognitive dysfunction.  At baseline she is able to recognize her family members.  Mostly wheelchair-bound.  Recent decline since Christmas.   Hypomagnesemia.  Magnesium of 1.1 today.  Will replace with 4 g of IV magnesium sulfate.  New sacral ulcer with erythema stage I with Stage 2 pressure ulceration.   CT scan of the abdomen pelvis without any acute findings.  No evidence of local soft tissue abscess on the sacral area.  Wound care on board.  Continue local wound care.   Hypokalemia.  Potassium today at 2.5.  Will continue to replenish through IV and oral.  Check levels in AM.  Will check magnesium level as well.  History of multiple sclerosis with mild cognitive dysfunction currently at the assisted nursing facility on wheelchair with limited use of legs.  Will get PT OT evaluation when clinically more stable..  Carbamazepine on hold will restart when p.o. okay.   Essential hypertension Will closely monitor blood pressure. On metoprolol as outpatient.  Unable to do p.o.'s so we will put the patient on IV metoprolol for now .Marland Kitchen    Hypothyroidism TSH elevated at 5.5..  On Synthroid at home.  Currently on hold.   Debility weakness.  Patient is wheelchair-bound at the assisted living facility.  Will obtain physical therapy evaluation when appropriate.    DVT prophylaxis: enoxaparin (LOVENOX) injection 40 mg Start: 11-11-22 2200   Code Status:     Code Status: DNR  Disposition: Assisted living facility/skilled nursing facility.  Status is:  Observation  The patient will require care spanning > 2 midnights and should be moved to inpatient because: Sepsis secondary to foot infection, metabolic encephalopathy, IV antibiotic, podiatry evaluation and possible intervention.   Family Communication: Spoke with the patient's son  on the phone 10/17/2022  Consultants:  Podiatry  Procedures:  None yet  Antimicrobials:  Vancomycin and Rocephin.  Anti-infectives (From admission, onward)    Start     Dose/Rate Route Frequency Ordered Stop   10/20/22 1800  vancomycin (VANCOCIN) IVPB 1000 mg/200 mL premix        1,000 mg 200 mL/hr over 60 Minutes Intravenous Every 24 hours 10/06/2022 1839 10/27/22 1759   10/20/22 1800  cefTRIAXone (ROCEPHIN) 2 g in sodium chloride 0.9 % 100 mL IVPB        2 g 200 mL/hr over 30 Minutes Intravenous Every 24 hours 10/20/22 1104     10/20/22 0600  ceFEPIme (MAXIPIME) 2 g in sodium chloride 0.9 % 100 mL IVPB  Status:  Discontinued        2 g 200 mL/hr over 30 Minutes Intravenous Every 12 hours 11/04/2022 1839 10/20/22 1104   10/14/2022 1830  vancomycin (VANCOCIN) IVPB 1000 mg/200 mL premix        1,000 mg 200 mL/hr over 60 Minutes Intravenous  Once 10/15/2022 1828 10/26/2022 1941   10/31/2022 1830  ceFEPIme (MAXIPIME) 2 g in sodium chloride 0.9 % 100 mL IVPB        2 g 200 mL/hr over 30 Minutes Intravenous  Once 10/18/2022 1828 10/27/2022 1857   11/02/2022 1745  cefTRIAXone (ROCEPHIN) 1 g in sodium chloride 0.9 % 100 mL IVPB        1 g 200 mL/hr over 30 Minutes Intravenous  Once 10/16/2022 1736 10/14/2022 1833      Subjective: Today, patient was seen and examined at bedside.  Patient appears to be altered with few words only.  Lying on one side of the bed in flexed posture.  Does not interact much.  Objective: Vitals:   10/20/22 0459 10/20/22 0702 10/20/22 0921 10/20/22 1027  BP:  (!) 141/66  (!) 143/72  Pulse:  (!) 101  98  Resp:  19  16  Temp: (!) 100.9 F (38.3 C)  99.9 F (37.7 C)   TempSrc: Rectal  Axillary   SpO2:  99%  96%  Weight:      Height:        Intake/Output Summary (Last 24 hours) at 10/20/2022 1119 Last data filed at 10/31/2022 1857 Gross per 24 hour  Intake 200 ml  Output --  Net 200 ml   Filed Weights   10/11/2022 1234  Weight: 59 kg    Physical Examination: Body  mass index is 23.78 kg/m.  General:  Average built, not in obvious distress elderly female, noninteractive, lying flexed posture. HENT:   No scleral pallor or icterus noted. Oral mucosa is dry. Chest:    Diminished breath sounds bilaterally. No crackles or wheezes.  CVS: S1 &S2 heard. No murmur.  Regular rate and rhythm. Abdomen: Soft, nontender, nondistended.  Bowel sounds are heard.   Extremities: Left foot with gross cellulitis and ulceration, Psych: Somnolent, confused and disoriented, less interactive, CNS: Moves extremities, less interactive, Skin: Warm and dry.  Sacral stage I and stage II ulceration present on admission.  Left foot cellulitis with ulcer.  Data Reviewed:   CBC: Recent Labs  Lab 10/18/2022 1233 10/20/22 0507  WBC 12.5* 8.3  NEUTROABS 10.0*  --  HGB 11.6* 10.3*  HCT 34.5* 30.3*  MCV 91.5 90.4  PLT PLATELET CLUMPS NOTED ON SMEAR, COUNT APPEARS ADEQUATE 288    Basic Metabolic Panel: Recent Labs  Lab Nov 13, 2022 1233 10/20/22 0507  NA 137 132*  K 3.2* 2.5*  CL 101 100  CO2 26 22  GLUCOSE 99 114*  BUN 13 7*  CREATININE 0.66 0.58  CALCIUM 8.6* 7.5*  MG  --  1.1*    Liver Function Tests: Recent Labs  Lab Nov 13, 2022 1233 10/20/22 0507  AST 28 23  ALT 18 15  ALKPHOS 56 40  BILITOT 0.5 0.9  PROT 8.1 6.2*  ALBUMIN 3.7 2.8*     Radiology Studies: CT ABDOMEN PELVIS W CONTRAST  Result Date: 11-13-22 CLINICAL DATA:  Sacral wound EXAM: CT ABDOMEN AND PELVIS WITH CONTRAST TECHNIQUE: Multidetector CT imaging of the abdomen and pelvis was performed using the standard protocol following bolus administration of intravenous contrast. RADIATION DOSE REDUCTION: This exam was performed according to the departmental dose-optimization program which includes automated exposure control, adjustment of the mA and/or kV according to patient size and/or use of iterative reconstruction technique. CONTRAST:  OMNIPAQUE IOHEXOL 300 MG/ML  SOLN COMPARISON:  None  Available. FINDINGS: Lower chest: No acute abnormality Hepatobiliary: No focal hepatic abnormality. Gallbladder unremarkable. Pancreas: No focal abnormality or ductal dilatation. Spleen: No focal abnormality.  Normal size. Adrenals/Urinary Tract: No adrenal abnormality. No focal renal abnormality. No stones or hydronephrosis. Urinary bladder is unremarkable. Stomach/Bowel: Stomach, large and small bowel grossly unremarkable. Vascular/Lymphatic: Aortic atherosclerosis. No evidence of aneurysm or adenopathy. Reproductive: Uterus and adnexa unremarkable.  No mass. Other: No free fluid or free air. Musculoskeletal: Postoperative changes in the lower lumbar spine. No acute bony abnormality. No bone destruction in the sacrum/coccyx to suggest osteomyelitis. Subcutaneous calcifications within the gluteal regions bilaterally, presumably related to chronic pressure or injection granulomas. No focal fluid collections to suggest abscess. IMPRESSION: No evidence of focal fluid collection or sacral/coccygeal osteo myelitis. No acute findings in the abdomen or pelvis. Electronically Signed   By: Charlett Nose M.D.   On: 11-13-22 17:31   CT Head Wo Contrast  Result Date: 2022-11-13 CLINICAL DATA:  Mental status change, unknown cause EXAM: CT HEAD WITHOUT CONTRAST TECHNIQUE: Contiguous axial images were obtained from the base of the skull through the vertex without intravenous contrast. RADIATION DOSE REDUCTION: This exam was performed according to the departmental dose-optimization program which includes automated exposure control, adjustment of the mA and/or kV according to patient size and/or use of iterative reconstruction technique. COMPARISON:  02/14/2019 FINDINGS: Brain: There is atrophy and chronic small vessel disease changes. No acute intracranial abnormality. Specifically, no hemorrhage, hydrocephalus, mass lesion, acute infarction, or significant intracranial injury. Vascular: No hyperdense vessel or unexpected  calcification. Skull: No acute calvarial abnormality. Sinuses/Orbits: No acute findings Other: None IMPRESSION: Atrophy, chronic microvascular disease. No acute intracranial abnormality. Electronically Signed   By: Charlett Nose M.D.   On: 11-13-22 17:27   VAS [place] LOWER EXTREMITY VENOUS (DVT) (ONLY [facility] & [facility])  Result Date: 2022/11/13  Lower Venous DVT Study Patient Name:  LALITA EBEL  Date of Exam:   11/13/22 Medical Rec #: [id number]         Accession #:    [id number] Date of Birth: [birth year]          Patient Gender: F Patient Age:   63 years Exam Location:  [facility] Procedure:      VAS [place] LOWER EXTREMITY VENOUS (DVT) Referring  Phys: MARIAH GOWENS --------------------------------------------------------------------------------  Indications: Swelling, and Erythema.  Limitations: Quality of study limited due to patient's innability to cooperate (movement and poor positioning) due to altered mental status. Comparison Study: No previous exams Performing Technologist: Jody Hill RVT, RDMS  Examination Guidelines: A complete evaluation includes B-mode imaging, spectral Doppler, color Doppler, and power Doppler as needed of all accessible portions of each vessel. Bilateral testing is considered an integral part of a complete examination. Limited examinations for reoccurring indications may be performed as noted. The reflux portion of the exam is performed with the patient in reverse Trendelenburg.  +---------+---------------+---------+-----------+----------+--------------+ RIGHT    CompressibilityPhasicitySpontaneityPropertiesThrombus Aging +---------+---------------+---------+-----------+----------+--------------+ CFV      Full           Yes      Yes                                 +---------+---------------+---------+-----------+----------+--------------+ SFJ      Full                                                         +---------+---------------+---------+-----------+----------+--------------+ FV Prox  Full           Yes      Yes                                 +---------+---------------+---------+-----------+----------+--------------+ FV Mid   Full           Yes      Yes                                 +---------+---------------+---------+-----------+----------+--------------+ FV DistalFull           Yes      Yes                                 +---------+---------------+---------+-----------+----------+--------------+ PFV      Full                                                        +---------+---------------+---------+-----------+----------+--------------+ POP      Full           Yes      Yes                                 +---------+---------------+---------+-----------+----------+--------------+ PTV      Full                                                        +---------+---------------+---------+-----------+----------+--------------+ PERO     Full                                                        +---------+---------------+---------+-----------+----------+--------------+   +---------+---------------+---------+-----------+----------+--------------+  LEFT     CompressibilityPhasicitySpontaneityPropertiesThrombus Aging +---------+---------------+---------+-----------+----------+--------------+ CFV      Full           Yes      Yes                                 +---------+---------------+---------+-----------+----------+--------------+ SFJ      Full                                                        +---------+---------------+---------+-----------+----------+--------------+ FV Prox  Full           Yes      Yes                                 +---------+---------------+---------+-----------+----------+--------------+ FV Mid   Full           Yes      Yes                                  +---------+---------------+---------+-----------+----------+--------------+ FV DistalFull           Yes      Yes                                 +---------+---------------+---------+-----------+----------+--------------+ PFV      Full                                                        +---------+---------------+---------+-----------+----------+--------------+ POP      Full           Yes      Yes                                 +---------+---------------+---------+-----------+----------+--------------+ PTV                                                   not visualized +---------+---------------+---------+-----------+----------+--------------+ PERO                                                  not visualized +---------+---------------+---------+-----------+----------+--------------+   Left Technical Findings: Not visualized segments include Peroneal and posterior tibial veins.   Summary: BILATERAL: -No evidence of popliteal cyst, bilaterally. RIGHT: - There is no evidence of deep vein thrombosis in the lower extremity.  - Ultrasound characteristics of enlarged lymph nodes are noted in the groin.  LEFT: - There is no evidence of deep vein thrombosis in the lower extremity. However, portions of this examination were limited- see technologist comments above.  - Ultrasound characteristics of enlarged  lymph nodes noted in the groin.  *See table(s) above for measurements and observations.    Preliminary    DG Foot Complete Left  Result Date: 10/09/2022 CLINICAL DATA:  ulceration left little toe EXAM: LEFT FOOT - COMPLETE 3+ VIEW COMPARISON:  None Available. FINDINGS: Evaluation is limited by osteopenia. Query cortical offset of the head of the fifth metatarsal. Otherwise no definitive acute fracture or dislocation. Degenerative changes throughout the toes. No area of erosion or osseous destruction. No unexpected radiopaque foreign body. Soft tissue edema. Vascular  calcifications. IMPRESSION: 1. Query cortical offset of the head of the fifth metatarsal. This may reflect a minimally displaced fracture. Recommend correlation with point tenderness. 2.  No definitive radiographic evidence of osteomyelitis. Electronically Signed   By: Meda Klinefelter M.D.   On: 11/02/2022 13:23   DG Chest Port 1 View  Result Date: 10/14/2022 CLINICAL DATA:  Questionable sepsis - evaluate for abnormality EXAM: PORTABLE CHEST 1 VIEW COMPARISON:  Feb 10, 2017 FINDINGS: Evaluation is limited by patient positioning. Incomplete assessment of the LEFT apex laterally. The cardiomediastinal silhouette is grossly similar in contour given patient rotation. No pleural effusion. No pneumothorax. No acute pleuroparenchymal abnormality. Bilateral soft tissue calcifications of the arms IMPRESSION: 1. No acute cardiopulmonary abnormality within the limitations of this rotated exam. If there is clinical concern for mediastinal pathology, recommend repeat PA and lateral chest radiograph. 2. Bilateral tissue calcifications of the arms, possibly sequela of prior trauma. Recommend correlation with physical exam. Electronically Signed   By: Meda Klinefelter M.D.   On: 10/20/2022 13:18      LOS: 0 days    Joycelyn Das, MD [facility] Available via Epic secure chat 7am-7pm After these hours, please refer to coverage provider listed on [email or web address] 10/20/2022, 11:19 AM

## 2022-10-20 NOTE — Consult Note (Signed)
PODIATRY CONSULTATION  NAME Melanie Richardson MRN [id number] DOB 1950/12/29 DOA 2022-10-25   Reason for consult:  Chief Complaint  Patient presents with   Altered Mental Status    Attending/Consulting physician: Pokhrel MD  History of present illness: 72 y.o. female with dementia / non verbal at basline, wheelchair bound, MS, HTN, hypothyroid. Comes in from assisted living with concern for confusion agitation disorientation. New ulcer left foot with redness for 2 weeks. On broad spectrum abx. Unable to get any history from pt on the wound.   Past Medical History:  Diagnosis Date   Hypertension    MS (multiple sclerosis) ([place])    Osteopenia 05/2014   T score -1.8 FRAX 16%/4.7%   Thyroid disease    Hypothyroid   Trigeminal neuralgia        Latest Ref Rng & Units 10/20/2022    5:07 AM 10/25/22   12:33 PM 11/21/2021    4:11 PM  CBC  WBC 4.0 - 10.5 K/uL 8.3  12.5  5.9   Hemoglobin 12.0 - 15.0 g/dL 10.3  11.6  13.0   Hematocrit 36.0 - 46.0 % 30.3  34.5  37.9   Platelets 150 - 400 K/uL 288  PLATELET CLUMPS NOTED ON SMEAR, COUNT APPEARS ADEQUATE  266        Latest Ref Rng & Units 10/20/2022    5:07 AM Oct 25, 2022   12:33 PM 11/21/2021    4:11 PM  BMP  Glucose 70 - 99 mg/dL 114  99  113   BUN 8 - 23 mg/dL 7  13  13    Creatinine 0.44 - 1.00 mg/dL 0.58  0.66  0.69   BUN/Creat Ratio 12 - 28   19   Sodium 135 - 145 mmol/L 132  137  133   Potassium 3.5 - 5.1 mmol/L 2.5  3.2  4.3   Chloride 98 - 111 mmol/L 100  101  97   CO2 22 - 32 mmol/L 22  26  23    Calcium 8.9 - 10.3 mg/dL 7.5  8.6  9.6       Physical Exam: Lower Extremity Exam Vasc: R - PT palpable, DP palpable. Cap refill < 3 sec to digits  L - PT palpable, DP palpable. Cap refill <3 sec to digits  Derm: R - Normal temp/texture/turgor with no open lesion or clinical signs of infection   L - Ulceration with fibrotic and necrotic tissue, mal odor seropurulent drainage dorsal aspect L 5th met head. Surrounding erythema  and edema of the left forefoot.       MSK:  R -  No gross deformities. Compartments soft, non-tender, compressible  L -  No gross deformities. Compartments soft, non-tender, compressible. Pain with left forefoot palpation.  Neuro: R - Gross sensation inact. Gross motor function intact   L - Gross sensation intact. Gross motor function intact    ASSESSMENT/PLAN OF CARE 72 y.o. female with PMHx significant for  cognitive impairment / dementia, hypothyroid, wheelchair boud with left dorsal lateral forefoot ucleration overlying 5th met head and cellulitis left foot, concern for undelrying osteomyelitis.   Tmax 102.7 now AF, Tachycardia 101  WBC 12.5  XR L foot: Possible OM L 5th met head  - Recommend MRI of the Left foot to evaluate extent of infection / OM in 5th met head/ if 5th toe involved. If not able to tolerate MRI, rec CT w contrast as alternative.  - Expect pt will require I&D with likely 5th met head  resection vs partial ray amputation this admission. Timing TBD likely next 1-2 days pending discussion with pts sons.  - Continue IV abx broad spectrum pending further culture data - Anticoagulation: per primary - Wound care: daily betadine and non adherent gauze dressing to be applied to left foot wound - WB status: pt non ambulatory - Will continue to follow   Thank you for the consult.  Please contact me directly with any questions or concerns.           Everitt Amber, DPM [facility]    [address], [place] [address]                Office [phone]  Fax [phone]

## 2022-10-20 NOTE — Consult Note (Signed)
WOC Nurse Consult Note: Reason for Consult:Full thickness wound to left foot, 5th digit with nonviable wound bed, Stage 2 pressure injury to sacrum. Photos provided yesterday to EMR appreciated. Wound type:trauma vs neuropathic, pressure Pressure Injury POA: Yes Measurement:To be obtained by Bedside RN with next dressing change today and documented on Nursing Flow Sheet Wound bed: black, soft nonviable tissue in wound bed of full thickness ulceration at left foot, dorsal aspect of 5th digit Drainage (amount, consistency, odor) small Periwound:intact Dressing procedure/placement/frequency:I have provided guidance for the topical care of these lesions using a silicone foam to the Stage 2 sacral wound and an antimicrobial wound gel (Medihoney, leptospermum Manuka honey) to the foot wound. Both are to be changed daily. Turning and repositioning with time in the supine position limited is recommended.  [facility] nursing team will not follow, but will remain available to this patient, the nursing and medical teams.  Please re-consult if needed.  Thank you for inviting [place] to participate in this patient's Plan of Care.  Maudie Flakes, MSN, RN, CNS, [place], Serita Grammes, [employer], [employer] phone:  [phone]

## 2022-10-20 NOTE — Progress Notes (Signed)
Patient is a poor historian,altered mental status. Unable to do admission.

## 2022-10-20 NOTE — ED Notes (Addendum)
Called [facility] to give report. Was told I would get a call back to give report.

## 2022-10-20 NOTE — Progress Notes (Signed)
SLP Cancellation Note  Patient Details Name: Melanie Richardson MRN: [id number] DOB: 1951-06-01   Cancelled treatment:       Reason Eval/Treat Not Completed: Patient declined, no reason specified. Patient not interacting or participating despite SLP and MD attempts. Per RN, she has been resistant/refusing most interventions. SLP will follow for readiness.   Sonia Baller, MA, CCC-SLP Speech Therapy

## 2022-10-20 NOTE — ED Notes (Signed)
ED TO INPATIENT HANDOFF REPORT  ED Nurse Name and Phone #: Sylvan Cheese Name/Age/Gender Melanie Richardson 72 y.o. female Room/Bed: RESA/RESA  Code Status   Code Status: DNR  Home/SNF/Other Nursing Home Patient oriented to: self Is this baseline?  No, normally AAOx2  Triage Complete: Triage complete  Chief Complaint Altered mental status [R41.82] Sepsis ([place]) [A41.9]  Triage Note Patient brought in by EMS from [place] from [place]. Per EMS Patient was DX with UTI and has not started on ABX. She has new wound to L small toe.  146/66 80 98% RA CBG:119   Allergies Allergies  Allergen Reactions   Ciprofloxacin Other (See Comments)    Dizziness and headache   Codeine Other (See Comments)    GI upset   Erythromycin Other (See Comments)    Unknown    Level of Care/Admitting Diagnosis ED Disposition     ED Disposition  Admit   Condition  --   Comment  Hospital Area: [facility] [id number]  Level of Care: Telemetry [5]  Admit to tele based on following criteria: Other see comments  Comments: sepsis  May admit patient to [facility] or [facility] if equivalent level of care is available:: No  Covid Evaluation: Asymptomatic - no recent exposure (last 10 days) testing not required  Diagnosis: Sepsis [facility]) [id number]  Admitting Physician: Flora Lipps [id number]  Attending Physician: Flora Lipps [id number]  Certification:: I certify this patient will need inpatient services for at least 2 midnights  Estimated Length of Stay: 3          B Medical/Surgery History Past Medical History:  Diagnosis Date   Hypertension    MS (multiple sclerosis) ([place])    Osteopenia 05/2014   T score -1.8 FRAX 16%/4.7%   Thyroid disease    Hypothyroid   Trigeminal neuralgia    Past Surgical History:  Procedure Laterality Date   BACK SURGERY  2011   [place]   shattered elbow   feet surgery  1990   gamma knife  2005   radiofrquency rhizotomy   2002,2003   underarm surgery  1988     A IV Location/Drains/Wounds Patient Lines/Drains/Airways Status     Active Line/Drains/Airways     Name Placement date Placement time Site Days   Peripheral IV 10/20/22 Left;Posterior Forearm 10/20/22  1434  Forearm  less than 1            Intake/Output Last 24 hours  Intake/Output Summary (Last 24 hours) at 10/20/2022 1539 Last data filed at 10/27/2022 1857 Gross per 24 hour  Intake 200 ml  Output --  Net 200 ml    Labs/Imaging Results for orders placed or performed during the hospital encounter of 10/15/2022 (from the past 48 hour(s))  Lactic acid, plasma     Status: None   Collection Time: 10/10/2022 12:33 PM  Result Value Ref Range   Lactic Acid, Venous 1.5 0.5 - 1.9 mmol/L    Comment: Performed at [facility], [place] [address] [place], [place] [address]  Comprehensive metabolic panel     Status: Abnormal   Collection Time: 10/30/2022 12:33 PM  Result Value Ref Range   Sodium 137 135 - 145 mmol/L   Potassium 3.2 (L) 3.5 - 5.1 mmol/L   Chloride 101 98 - 111 mmol/L   CO2 26 22 - 32 mmol/L   Glucose, Bld 99 70 - 99 mg/dL    Comment: Glucose reference range applies only to samples  taken after fasting for at least 8 hours.   BUN 13 8 - 23 mg/dL   Creatinine, Ser 0.66 0.44 - 1.00 mg/dL   Calcium 8.6 (L) 8.9 - 10.3 mg/dL   Total Protein 8.1 6.5 - 8.1 g/dL   Albumin 3.7 3.5 - 5.0 g/dL   AST 28 15 - 41 U/L   ALT 18 0 - 44 U/L   Alkaline Phosphatase 56 38 - 126 U/L   Total Bilirubin 0.5 0.3 - 1.2 mg/dL   GFR, Estimated >60 >60 mL/min    Comment: (NOTE) Calculated using the CKD-EPI Creatinine Equation (2021)    Anion gap 10 5 - 15    Comment: Performed at [facility], [place] [address], [place], [place] [address]  CBC with Differential     Status: Abnormal   Collection Time: 10/16/2022 12:33 PM  Result Value Ref Range   WBC 12.5 (H) 4.0 - 10.5 K/uL    Comment: WHITE COUNT CONFIRMED ON SMEAR    RBC 3.77 (L) 3.87 - 5.11 MIL/uL   Hemoglobin 11.6 (L) 12.0 - 15.0 g/dL   HCT 34.5 (L) 36.0 - 46.0 %   MCV 91.5 80.0 - 100.0 fL   MCH 30.8 26.0 - 34.0 pg   MCHC 33.6 30.0 - 36.0 g/dL   RDW 13.1 11.5 - 15.5 %   Platelets  150 - 400 K/uL    PLATELET CLUMPS NOTED ON SMEAR, COUNT APPEARS ADEQUATE   nRBC 0.0 0.0 - 0.2 %   Neutrophils Relative % 79 %   Neutro Abs 10.0 (H) 1.7 - 7.7 K/uL   Lymphocytes Relative 10 %   Lymphs Abs 1.2 0.7 - 4.0 K/uL   Monocytes Relative 8 %   Monocytes Absolute 1.0 0.1 - 1.0 K/uL   Eosinophils Relative 1 %   Eosinophils Absolute 0.1 0.0 - 0.5 K/uL   Basophils Relative 1 %   Basophils Absolute 0.1 0.0 - 0.1 K/uL   Immature Granulocytes 1 %   Abs Immature Granulocytes 0.06 0.00 - 0.07 K/uL    Comment: Performed at [facility], [place] [address] [place], [place] [address]  Protime-INR     Status: None   Collection Time: 10/09/2022 12:33 PM  Result Value Ref Range   Prothrombin Time 13.3 11.4 - 15.2 seconds   INR 1.0 0.8 - 1.2    Comment: (NOTE) INR goal varies based on device and disease states. Performed at [facility], [place] [address], [place], [place] [address]   APTT     Status: None   Collection Time: 10/09/2022 12:33 PM  Result Value Ref Range   aPTT 27 24 - 36 seconds    Comment: Performed at [facility], [place] [address] [place], [place] [address]  Blood Culture (routine x 2)     Status: None (Preliminary result)   Collection Time: 10/30/2022 12:33 PM   Specimen: BLOOD  Result Value Ref Range   Specimen Description      BLOOD RIGHT ANTECUBITAL Performed at [facility] [address], [place], [place] [address]    Special Requests      BOTTLES DRAWN AEROBIC AND ANAEROBIC Blood Culture results may not be optimal due to an excessive volume of blood received in culture bottles Performed at [facility] [address], [place], [place] [address]     Culture      NO GROWTH < 24 HOURS Performed at [facility]  [address], [place], [place] [address]    Report Status PENDING   Urinalysis, Routine w reflex microscopic Urine, In & Out Cath     Status: Abnormal   Collection Time: 11/03/2022 12:33 PM  Result Value Ref Range   Color, Urine YELLOW YELLOW   APPearance CLEAR CLEAR   Specific Gravity, Urine 1.017 1.005 - 1.030   pH 6.0 5.0 - 8.0   Glucose, UA NEGATIVE NEGATIVE mg/dL   Hgb urine dipstick NEGATIVE NEGATIVE   Bilirubin Urine NEGATIVE NEGATIVE   Ketones, ur 5 (A) NEGATIVE mg/dL   Protein, ur NEGATIVE NEGATIVE mg/dL   Nitrite NEGATIVE NEGATIVE   Leukocytes,Ua NEGATIVE NEGATIVE    Comment: Performed at [facility] [address], [place], [place] [address]  Resp panel by RT-PCR (RSV, Flu A&B, Covid) Anterior Nasal Swab     Status: None   Collection Time: 11/05/2022  2:50 PM   Specimen: Anterior Nasal Swab  Result Value Ref Range   SARS Coronavirus 2 by RT PCR NEGATIVE NEGATIVE    Comment: (NOTE) SARS-CoV-2 target nucleic acids are NOT DETECTED.  The SARS-CoV-2 RNA is generally detectable in upper respiratory specimens during the acute phase of infection. The lowest concentration of SARS-CoV-2 viral copies this assay can detect is 138 copies/mL. A negative result does not preclude SARS-Cov-2 infection and should not be used as the sole basis for treatment or other patient management decisions. A negative result may occur with  improper specimen collection/handling, submission of specimen other than nasopharyngeal swab, presence of viral mutation(s) within the areas targeted by this assay, and inadequate number of viral copies(<138 copies/mL). A negative result must be combined with clinical observations, patient history, and epidemiological information. The expected result is Negative.  Fact Sheet for Patients:  EntrepreneurPulse.com.au  Fact Sheet for Healthcare Providers:   IncredibleEmployment.be  This test is no t yet approved or cleared by the [place] FDA and  has been authorized for detection and/or diagnosis of SARS-CoV-2 by FDA under an Emergency Use Authorization (EUA). This EUA will remain  in effect (meaning this test can be used) for the duration of the COVID-19 declaration under Section 564(b)(1) of the Act, 21 U.S.C.section 360bbb-3(b)(1), unless the authorization is terminated  or revoked sooner.       Influenza A by PCR NEGATIVE NEGATIVE   Influenza B by PCR NEGATIVE NEGATIVE    Comment: (NOTE) The Xpert Xpress SARS-CoV-2/FLU/RSV plus assay is intended as an aid in the diagnosis of influenza from Nasopharyngeal swab specimens and should not be used as a sole basis for treatment. Nasal washings and aspirates are unacceptable for Xpert Xpress SARS-CoV-2/FLU/RSV testing.  Fact Sheet for Patients: EntrepreneurPulse.com.au  Fact Sheet for Healthcare Providers: IncredibleEmployment.be  This test is not yet approved or cleared by the [place] FDA and has been authorized for detection and/or diagnosis of SARS-CoV-2 by FDA under an Emergency Use Authorization (EUA). This EUA will remain in effect (meaning this test can be used) for the duration of the COVID-19 declaration under Section 564(b)(1) of the Act, 21 U.S.C. section 360bbb-3(b)(1), unless the authorization is terminated or revoked.     Resp Syncytial Virus by PCR NEGATIVE NEGATIVE    Comment: (NOTE) Fact Sheet for Patients: EntrepreneurPulse.com.au  Fact Sheet for Healthcare Providers: IncredibleEmployment.be  This test is not yet approved or cleared by the [place] FDA and has been authorized for detection and/or diagnosis of SARS-CoV-2 by FDA under an Emergency Use Authorization (EUA). This EUA will remain in effect (  meaning this test can be used) for the duration of  the COVID-19 declaration under Section 564(b)(1) of the Act, 21 U.S.C. section 360bbb-3(b)(1), unless the authorization is terminated or revoked.  Performed at [facility], [place] [address] [place], [place] [address]   Sedimentation rate     Status: None   Collection Time: 10/20/22  5:07 AM  Result Value Ref Range   Sed Rate 18 0 - 22 mm/hr    Comment: Performed at [facility], [place] [address], [place], [place] [address]  TSH     Status: Abnormal   Collection Time: 10/20/22  5:07 AM  Result Value Ref Range   TSH 5.589 (H) 0.350 - 4.500 uIU/mL    Comment: Performed by a 3rd Generation assay with a functional sensitivity of <=0.01 uIU/mL. Performed at [facility], [place] [address], [place], [place] [address]   Comprehensive metabolic panel     Status: Abnormal   Collection Time: 10/20/22  5:07 AM  Result Value Ref Range   Sodium 132 (L) 135 - 145 mmol/L   Potassium 2.5 (LL) 3.5 - 5.1 mmol/L    Comment: CRITICAL RESULT CALLED TO, READ BACK BY AND VERIFIED WITH GRAY,S. RN AT [id number] 10/20/22 MULLINS,T    Chloride 100 98 - 111 mmol/L   CO2 22 22 - 32 mmol/L   Glucose, Bld 114 (H) 70 - 99 mg/dL    Comment: Glucose reference range applies only to samples taken after fasting for at least 8 hours.   BUN 7 (L) 8 - 23 mg/dL   Creatinine, Ser 0.58 0.44 - 1.00 mg/dL   Calcium 7.5 (L) 8.9 - 10.3 mg/dL   Total Protein 6.2 (L) 6.5 - 8.1 g/dL   Albumin 2.8 (L) 3.5 - 5.0 g/dL   AST 23 15 - 41 U/L   ALT 15 0 - 44 U/L   Alkaline Phosphatase 40 38 - 126 U/L   Total Bilirubin 0.9 0.3 - 1.2 mg/dL   GFR, Estimated >60 >60 mL/min    Comment: (NOTE) Calculated using the CKD-EPI Creatinine Equation (2021)    Anion gap 10 5 - 15    Comment: Performed at [facility], [place] [address] [place], [place] [address]  CBC     Status: Abnormal   Collection Time: 10/20/22  5:07 AM  Result Value Ref Range   WBC 8.3 4.0 - 10.5 K/uL    RBC 3.35 (L) 3.87 - 5.11 MIL/uL   Hemoglobin 10.3 (L) 12.0 - 15.0 g/dL   HCT 30.3 (L) 36.0 - 46.0 %   MCV 90.4 80.0 - 100.0 fL   MCH 30.7 26.0 - 34.0 pg   MCHC 34.0 30.0 - 36.0 g/dL   RDW 12.9 11.5 - 15.5 %   Platelets 288 150 - 400 K/uL   nRBC 0.0 0.0 - 0.2 %    Comment: Performed at [facility] , [place] [address] [place], [place] [address]  Magnesium     Status: Abnormal   Collection Time: 10/20/22  5:07 AM  Result Value Ref Range   Magnesium 1.1 (L) 1.7 - 2.4 mg/dL    Comment: Performed at [facility], [place] [address] [place],  [address]   CT ABDOMEN PELVIS W CONTRAST  Result Date: 10/15/2022 CLINICAL DATA:  Sacral wound EXAM: CT ABDOMEN AND PELVIS WITH CONTRAST TECHNIQUE: Multidetector CT imaging of the abdomen and pelvis was performed using the standard protocol following bolus administration of intravenous contrast.  RADIATION DOSE REDUCTION: This exam was performed according to the departmental dose-optimization program which includes automated exposure control, adjustment of the mA and/or kV according to patient size and/or use of iterative reconstruction technique. CONTRAST:  136mL OMNIPAQUE IOHEXOL 300 MG/ML  SOLN COMPARISON:  None Available. FINDINGS: Lower chest: No acute abnormality Hepatobiliary: No focal hepatic abnormality. Gallbladder unremarkable. Pancreas: No focal abnormality or ductal dilatation. Spleen: No focal abnormality.  Normal size. Adrenals/Urinary Tract: No adrenal abnormality. No focal renal abnormality. No stones or hydronephrosis. Urinary bladder is unremarkable. Stomach/Bowel: Stomach, large and small bowel grossly unremarkable. Vascular/Lymphatic: Aortic atherosclerosis. No evidence of aneurysm or adenopathy. Reproductive: Uterus and adnexa unremarkable.  No mass. Other: No free fluid or free air. Musculoskeletal: Postoperative changes in the lower lumbar spine. No acute bony abnormality. No bone destruction in the  sacrum/coccyx to suggest osteomyelitis. Subcutaneous calcifications within the gluteal regions bilaterally, presumably related to chronic pressure or injection granulomas. No focal fluid collections to suggest abscess. IMPRESSION: No evidence of focal fluid collection or sacral/coccygeal osteo myelitis. No acute findings in the abdomen or pelvis. Electronically Signed   By: Rolm Baptise M.D.   On: 10/27/2022 17:31   CT Head Wo Contrast  Result Date: 10/17/2022 CLINICAL DATA:  Mental status change, unknown cause EXAM: CT HEAD WITHOUT CONTRAST TECHNIQUE: Contiguous axial images were obtained from the base of the skull through the vertex without intravenous contrast. RADIATION DOSE REDUCTION: This exam was performed according to the departmental dose-optimization program which includes automated exposure control, adjustment of the mA and/or kV according to patient size and/or use of iterative reconstruction technique. COMPARISON:  02/14/2019 FINDINGS: Brain: There is atrophy and chronic small vessel disease changes. No acute intracranial abnormality. Specifically, no hemorrhage, hydrocephalus, mass lesion, acute infarction, or significant intracranial injury. Vascular: No hyperdense vessel or unexpected calcification. Skull: No acute calvarial abnormality. Sinuses/Orbits: No acute findings Other: None IMPRESSION: Atrophy, chronic microvascular disease. No acute intracranial abnormality. Electronically Signed   By: Rolm Baptise M.D.   On: 10/11/2022 17:27   VAS [place] LOWER EXTREMITY VENOUS (DVT) (ONLY [facility] & [facility])  Result Date: 10/13/2022  Lower Venous DVT Study Patient Name:  JAYLNN DARLING  Date of Exam:   10/09/2022 Medical Rec #: [id number]         Accession #:    [id number] Date of Birth: 03-Nov-1950          Patient Gender: F Patient Age:   67 years Exam Location:  [facility] Procedure:      VAS [place] LOWER EXTREMITY VENOUS (DVT) Referring Phys: [facility] [place]OW[place]  --------------------------------------------------------------------------------  Indications: Swelling, and Erythema.  Limitations: Quality of study limited due to patient's innability to cooperate (movement and poor positioning) due to altered mental status. Comparison Study: No previous exams Performing Technologist: Jody Hill RVT, RDMS  Examination Guidelines: A complete evaluation includes B-mode imaging, spectral Doppler, color Doppler, and power Doppler as needed of all accessible portions of each vessel. Bilateral testing is considered an integral part of a complete examination. Limited examinations for reoccurring indications may be performed as noted. The reflux portion of the exam is performed with the patient in reverse Trendelenburg.  +---------+---------------+---------+-----------+----------+--------------+ RIGHT    CompressibilityPhasicitySpontaneityPropertiesThrombus Aging +---------+---------------+---------+-----------+----------+--------------+ CFV      Full           Yes      Yes                                 +---------+---------------+---------+-----------+----------+--------------+  SFJ      Full                                                        +---------+---------------+---------+-----------+----------+--------------+ FV Prox  Full           Yes      Yes                                 +---------+---------------+---------+-----------+----------+--------------+ FV Mid   Full           Yes      Yes                                 +---------+---------------+---------+-----------+----------+--------------+ FV DistalFull           Yes      Yes                                 +---------+---------------+---------+-----------+----------+--------------+ PFV      Full                                                        +---------+---------------+---------+-----------+----------+--------------+ POP      Full           Yes      Yes                                  +---------+---------------+---------+-----------+----------+--------------+ PTV      Full                                                        +---------+---------------+---------+-----------+----------+--------------+ PERO     Full                                                        +---------+---------------+---------+-----------+----------+--------------+   +---------+---------------+---------+-----------+----------+--------------+ LEFT     CompressibilityPhasicitySpontaneityPropertiesThrombus Aging +---------+---------------+---------+-----------+----------+--------------+ CFV      Full           Yes      Yes                                 +---------+---------------+---------+-----------+----------+--------------+ SFJ      Full                                                        +---------+---------------+---------+-----------+----------+--------------+  FV Prox  Full           Yes      Yes                                 +---------+---------------+---------+-----------+----------+--------------+ FV Mid   Full           Yes      Yes                                 +---------+---------------+---------+-----------+----------+--------------+ FV DistalFull           Yes      Yes                                 +---------+---------------+---------+-----------+----------+--------------+ PFV      Full                                                        +---------+---------------+---------+-----------+----------+--------------+ POP      Full           Yes      Yes                                 +---------+---------------+---------+-----------+----------+--------------+ PTV                                                   not visualized +---------+---------------+---------+-----------+----------+--------------+ PERO                                                  not visualized  +---------+---------------+---------+-----------+----------+--------------+   Left Technical Findings: Not visualized segments include Peroneal and posterior tibial veins.   Summary: BILATERAL: -No evidence of popliteal cyst, bilaterally. RIGHT: - There is no evidence of deep vein thrombosis in the lower extremity.  - Ultrasound characteristics of enlarged lymph nodes are noted in the groin.  LEFT: - There is no evidence of deep vein thrombosis in the lower extremity. However, portions of this examination were limited- see technologist comments above.  - Ultrasound characteristics of enlarged lymph nodes noted in the groin.  *See table(s) above for measurements and observations.    Preliminary    DG Foot Complete Left  Result Date: 10/12/2022 CLINICAL DATA:  ulceration left little toe EXAM: LEFT FOOT - COMPLETE 3+ VIEW COMPARISON:  None Available. FINDINGS: Evaluation is limited by osteopenia. Query cortical offset of the head of the fifth metatarsal. Otherwise no definitive acute fracture or dislocation. Degenerative changes throughout the toes. No area of erosion or osseous destruction. No unexpected radiopaque foreign body. Soft tissue edema. Vascular calcifications. IMPRESSION: 1. Query cortical offset of the head of the fifth metatarsal. This may reflect a minimally displaced fracture. Recommend correlation with point tenderness. 2.  No definitive radiographic evidence of osteomyelitis. Electronically Signed  By: Valentino Saxon M.D.   On: Nov 14, 2022 13:23   DG Chest Port 1 View  Result Date: 2022-11-14 CLINICAL DATA:  Questionable sepsis - evaluate for abnormality EXAM: PORTABLE CHEST 1 VIEW COMPARISON:  Feb 10, 2017 FINDINGS: Evaluation is limited by patient positioning. Incomplete assessment of the LEFT apex laterally. The cardiomediastinal silhouette is grossly similar in contour given patient rotation. No pleural effusion. No pneumothorax. No acute pleuroparenchymal abnormality. Bilateral soft  tissue calcifications of the arms IMPRESSION: 1. No acute cardiopulmonary abnormality within the limitations of this rotated exam. If there is clinical concern for mediastinal pathology, recommend repeat PA and lateral chest radiograph. 2. Bilateral tissue calcifications of the arms, possibly sequela of prior trauma. Recommend correlation with physical exam. Electronically Signed   By: Valentino Saxon M.D.   On: 11/14/2022 13:18    Pending Labs Unresulted Labs (From admission, onward)     Start     Ordered   10/21/22 3716  Basic metabolic panel  Tomorrow morning,   R        10/20/22 1119   10/21/22 0500  Magnesium  Tomorrow morning,   R        10/20/22 1119   10/21/22 0500  Comprehensive metabolic panel  Tomorrow morning,   R        10/20/22 1119   14-Nov-2022 1247  C-reactive protein  Once,   URGENT        11/14/2022 1246   11/14/22 1233  Lactic acid, plasma  (Undifferentiated presentation (screening labs and basic nursing orders))  STAT Now then every 2 hours,   R (with STAT occurrences)      2022-11-14 1235   November 14, 2022 1233  Blood Culture (routine x 2)  (Undifferentiated presentation (screening labs and basic nursing orders))  BLOOD CULTURE X 2,   STAT      11/14/22 1235   11/14/2022 1233  Urine Culture  (Undifferentiated presentation (screening labs and basic nursing orders))  ONCE - URGENT,   URGENT       Question Answer Comment  Indication Sepsis   Release to patient Immediate      11/14/22 1235            Vitals/Pain Today's Vitals   10/20/22 0921 10/20/22 1027 10/20/22 1329 10/20/22 1501  BP:  (!) 143/72  (!) 128/90  Pulse:  98  93  Resp:  16  16  Temp: 99.9 F (37.7 C)  99.9 F (37.7 C)   TempSrc: Axillary  Axillary   SpO2:  96%  96%  Weight:      Height:      PainSc:        Isolation Precautions No active isolations  Medications Medications  fluticasone (FLONASE) 50 MCG/ACT nasal spray 2 spray (has no administration in time range)  cycloSPORINE (RESTASIS) 0.05  % ophthalmic emulsion 1 drop (1 drop Both Eyes Given 10/20/22 1014)  enoxaparin (LOVENOX) injection 40 mg (40 mg Subcutaneous Given 11/14/22 2126)  dextrose 5 % and 0.45 % NaCl with KCl 10 mEq/L infusion ( Intravenous Rate/Dose Verify 10/20/22 0744)  acetaminophen (TYLENOL) tablet 650 mg ( Oral See Alternative 10/20/22 0256)    Or  acetaminophen (TYLENOL) suppository 650 mg (650 mg Rectal Given 10/20/22 0256)  ondansetron (ZOFRAN) tablet 4 mg (has no administration in time range)    Or  ondansetron (ZOFRAN) injection 4 mg (has no administration in time range)  hydrALAZINE (APRESOLINE) injection 5 mg (has no administration in time range)  vancomycin (VANCOCIN) IVPB  1000 mg/200 mL premix (has no administration in time range)  albuterol (PROVENTIL) (2.5 MG/3ML) 0.083% nebulizer solution 2.5 mg (has no administration in time range)  leptospermum manuka honey (MEDIHONEY) paste 1 Application (1 Application Topical Given 10/20/22 1016)  potassium chloride SA (KLOR-CON M) CR tablet 40 mEq (40 mEq Oral Not Given 10/20/22 0838)  cefTRIAXone (ROCEPHIN) 2 g in sodium chloride 0.9 % 100 mL IVPB (has no administration in time range)  metoprolol tartrate (LOPRESSOR) injection 2.5 mg (has no administration in time range)  potassium chloride (KLOR-CON M) CR tablet 30 mEq (30 mEq Oral Given 07-Nov-2022 1357)  LORazepam (ATIVAN) injection 0.5 mg (0.5 mg Intravenous Given 07-Nov-2022 1506)  LORazepam (ATIVAN) injection 1 mg (1 mg Intravenous Given Nov 07, 2022 1635)  iohexol (OMNIPAQUE) 300 MG/ML solution 100 mL (100 mLs Intravenous Contrast Given Nov 07, 2022 1659)  cefTRIAXone (ROCEPHIN) 1 g in sodium chloride 0.9 % 100 mL IVPB (0 g Intravenous Stopped 11-07-22 1833)  sodium chloride 0.9 % bolus 1,000 mL (1,000 mLs Intravenous New Bag/Given 2022-11-07 1759)  vancomycin (VANCOCIN) IVPB 1000 mg/200 mL premix (0 mg Intravenous Stopped 07-Nov-2022 1941)  ceFEPIme (MAXIPIME) 2 g in sodium chloride 0.9 % 100 mL IVPB (0 g Intravenous Stopped 2022/11/07  1857)  ketorolac (TORADOL) 15 MG/ML injection 15 mg (15 mg Intravenous Given 11-07-22 2313)  potassium chloride 10 mEq in 100 mL IVPB (0 mEq Intravenous Stopped 10/20/22 1515)  magnesium sulfate IVPB 4 g 100 mL (0 g Intravenous Stopped 10/20/22 1515)    Mobility non-ambulatory High fall risk   Focused Assessments N/A   R Recommendations: See Admitting Provider Note  Report given to:   Additional Notes: Pt arrived via EMS, for altered mental status and UTI. Spoke to [place] CNA at [facility], she stated pt is nonambulatory, AAOx2 and slightly aggressive.

## 2022-10-21 ENCOUNTER — Inpatient Hospital Stay (HOSPITAL_COMMUNITY): Payer: Medicare Other

## 2022-10-21 DIAGNOSIS — G9341 Metabolic encephalopathy: Secondary | ICD-10-CM | POA: Diagnosis not present

## 2022-10-21 DIAGNOSIS — G35 Multiple sclerosis: Secondary | ICD-10-CM | POA: Diagnosis not present

## 2022-10-21 DIAGNOSIS — L03032 Cellulitis of left toe: Secondary | ICD-10-CM | POA: Diagnosis not present

## 2022-10-21 DIAGNOSIS — I10 Essential (primary) hypertension: Secondary | ICD-10-CM | POA: Diagnosis not present

## 2022-10-21 DIAGNOSIS — M86172 Other acute osteomyelitis, left ankle and foot: Secondary | ICD-10-CM

## 2022-10-21 LAB — COMPREHENSIVE METABOLIC PANEL
ALT: 19 U/L (ref 0–44)
AST: 37 U/L (ref 15–41)
Albumin: 2.9 g/dL — ABNORMAL LOW (ref 3.5–5.0)
Alkaline Phosphatase: 41 U/L (ref 38–126)
Anion gap: 10 (ref 5–15)
BUN: <5 mg/dL — ABNORMAL LOW (ref 8–23)
CO2: 22 mmol/L (ref 22–32)
Calcium: 7.2 mg/dL — ABNORMAL LOW (ref 8.9–10.3)
Chloride: 101 mmol/L (ref 98–111)
Creatinine, Ser: 0.54 mg/dL (ref 0.44–1.00)
GFR, Estimated: >60 mL/min (ref >60)
Glucose, Bld: 122 mg/dL — ABNORMAL HIGH (ref 70–99)
Potassium: 2.6 mmol/L — CL (ref 3.5–5.1)
Sodium: 133 mmol/L — ABNORMAL LOW (ref 135–145)
Total Bilirubin: 0.5 mg/dL (ref 0.3–1.2)
Total Protein: 6.3 g/dL — ABNORMAL LOW (ref 6.5–8.1)

## 2022-10-21 LAB — MAGNESIUM: Magnesium: 1.4 mg/dL — ABNORMAL LOW (ref 1.7–2.4)

## 2022-10-21 MED ORDER — POTASSIUM CHLORIDE 2 MEQ/ML IV SOLN
INTRAVENOUS | Status: AC
  Filled 2022-10-21 (×6): qty 1000

## 2022-10-21 MED ORDER — BISACODYL 10 MG RE SUPP
10.0000 mg | Freq: Once | RECTAL | Status: AC
  Administered 2022-10-21: 10 mg via RECTAL
  Filled 2022-10-21: qty 1

## 2022-10-21 MED ORDER — HYDRALAZINE HCL 20 MG/ML IJ SOLN
10.0000 mg | Freq: Four times a day (QID) | INTRAMUSCULAR | Status: DC | PRN
  Administered 2022-10-27: 10 mg via INTRAVENOUS
  Filled 2022-10-21: qty 1

## 2022-10-21 MED ORDER — MAGNESIUM SULFATE 2 GM/50ML IV SOLN
2.0000 g | Freq: Once | INTRAVENOUS | Status: AC
  Administered 2022-10-21: 2 g via INTRAVENOUS
  Filled 2022-10-21: qty 50

## 2022-10-21 MED ORDER — LORAZEPAM 2 MG/ML PO CONC: 1.0000 mg | Freq: Once | ORAL | Status: DC

## 2022-10-21 MED ORDER — LORAZEPAM 2 MG/ML IJ SOLN
1.0000 mg | Freq: Once | INTRAMUSCULAR | Status: AC
  Administered 2022-10-21: 1 mg via INTRAVENOUS
  Filled 2022-10-21: qty 1

## 2022-10-21 MED ORDER — POTASSIUM CHLORIDE 10 MEQ/100ML IV SOLN
10.0000 meq | INTRAVENOUS | Status: AC
  Administered 2022-10-21 (×6): 10 meq via INTRAVENOUS
  Filled 2022-10-21 (×6): qty 100

## 2022-10-21 MED ORDER — IOHEXOL 300 MG/ML  SOLN
100.0000 mL | Freq: Once | INTRAMUSCULAR | Status: AC | PRN
  Administered 2022-10-21: 100 mL via INTRAVENOUS

## 2022-10-21 NOTE — Evaluation (Signed)
Occupational Therapy Evaluation Patient Details Name: Melanie Richardson MRN: [id number] DOB: 11/07/1950 Today's Date: 10/21/2022   History of Present Illness Patient is a 72 years old female with history of some cognitive impairment, multiple sclerosis, hypertension, hypothyroidism, wheelchair-bound status, presented to hospital 2022/11/18  from assisted living facility with confusion, disorientation, mild agitation at the facility.  There was some mention of redness swelling and tenderness of the bilateral lower extremities with an ulceration on the left fifth toe   Clinical Impression   This 72 yo female admitted with above presents to acute OT with PLOF of being able to transfer to/from surfaces on her own via W/C, toilet herself, and do her bathing and dressing with cues/S. Pt currently is total A for all basic ADLs and mobility. She will continue to benefit from acute OT with follow up recommended at SNF.      Recommendations for follow up therapy are one component of a multi-disciplinary discharge planning process, led by the attending physician.  Recommendations may be updated based on patient status, additional functional criteria and insurance authorization.   Follow Up Recommendations  Skilled nursing-short term rehab (<3 hours/day)     Assistance Recommended at Discharge Frequent or constant Supervision/Assistance  Patient can return home with the following Two people to help with walking and/or transfers;Two people to help with bathing/dressing/bathroom;Assistance with cooking/housework;Assistance with feeding;Assist for transportation;Direct supervision/assist for financial management;Direct supervision/assist for medications management    Functional Status Assessment  Patient has had a recent decline in their functional status and demonstrates the ability to make significant improvements in function in a reasonable and predictable amount of time.  Equipment Recommendations  Other  (comment) (TBD Next venue)       Precautions / Restrictions Precautions Precautions: Fall Precaution Comments: can be  combative Restrictions Weight Bearing Restrictions: No      Mobility Bed Mobility Overal bed mobility: Needs Assistance Bed Mobility: Rolling Rolling: Total assist, Min guard         General bed mobility comments: total A to roll to left with pt screaming and stating "roll be back"; min guard A to roll back to right side            ADL either performed or assessed with clinical judgement   ADL                                         General ADL Comments: total A bed level     Vision Baseline Vision/History:  (unknown and pt unable to tell us due to confusion)              Pertinent Vitals/Pain Pain Assessment Pain Assessment: PAINAD Breathing: normal Negative Vocalization: occasional moan/groan, low speech, negative/disapproving quality Facial Expression: facial grimacing Body Language: tense, distressed pacing, fidgeting Consolability: distracted or reassured by voice/touch PAINAD Score: 5 Pain Intervention(s): Limited activity within patient's tolerance, Repositioned, Monitored during session     Hand Dominance Right   Extremity/Trunk Assessment Upper Extremity Assessment Upper Extremity Assessment: Generalized weakness   Lower Extremity Assessment Lower Extremity Assessment: Difficult to assess due to impaired cognition;RLE deficits/detail;LLE deficits/detail RLE Deficits / Details: did partially lift foot off of bed  when asked to lift foot to place sheet, keeps knee/hip flexed, fetal position LLE Deficits / Details: same as right, dressing on the forefoot.   Cervical / Trunk Assessment Cervical / Trunk Assessment:  (rolled  on side in fetal position,  more agitated when placed on back during roll)   Communication Communication Communication: No difficulties   Cognition Arousal/Alertness: Awake/alert Behavior  During Therapy: Restless Overall Cognitive Status: No family/caregiver present to determine baseline cognitive functioning Area of Impairment: Following commands, Awareness                       Following Commands: Follows one step commands inconsistently   Awareness: Intellectual                    Home Living Family/patient expects to be discharged to:: Assisted living                             Home Equipment: Shower seat;Grab bars - tub/shower;Grab bars - toilet;Wheelchair - manual          Prior Functioning/Environment Prior Level of Function : Needs assist  Cognitive Assist : ADLs (cognitive)   ADLs (Cognitive): Intermittent cues       Mobility Comments: Per staff Gabriel Rung) at [place] on [facility] pt is able to transfer in and out of her W/C<>bed on her own and propel her own W/C with her feet. Pt has regular bed no rails ADLs Comments: Per staff Gabriel Rung) at [place] on [facility] pt is able to transfer W/C to toilet on her own using Grab bar, do her own toileting (clothing and hygiene), can bath and dress herself with S and coaxing, can feed herself        OT Problem List: Decreased strength;Decreased range of motion;Decreased activity tolerance;Decreased cognition;Impaired UE functional use;Pain      OT Treatment/Interventions: Self-care/ADL training;DME and/or AE instruction;Patient/family education;Balance training;Therapeutic activities    OT Goals(Current goals can be found in the care plan section) Acute Rehab OT Goals Patient Stated Goal: unable to state due to confusion and minmal verbalizations OT Goal Formulation: Patient unable to participate in goal setting Time For Goal Achievement: 11/04/22 Potential to Achieve Goals: Fair  OT Frequency: Min 2X/week    Co-evaluation PT/OT/SLP Co-Evaluation/Treatment: Yes Reason for Co-Treatment: Necessary to address cognition/behavior during functional activity;For  patient/therapist safety;To address functional/ADL transfers PT goals addressed during session: Mobility/safety with mobility;Strengthening/ROM OT goals addressed during session: ADL's and self-care;Strengthening/ROM      AM-PAC OT "6 Clicks" Daily Activity     Outcome Measure Help from another person eating meals?: Total Help from another person taking care of personal grooming?: Total Help from another person toileting, which includes using toliet, bedpan, or urinal?: Total Help from another person bathing (including washing, rinsing, drying)?: Total Help from another person to put on and taking off regular upper body clothing?: Total Help from another person to put on and taking off regular lower body clothing?: Total 6 Click Score: 6   End of Session Nurse Communication:  (IV was out and laying in bed upon our arrival; bed wet/soiled as well as mepilex on sacrum and left hip. Therapy cleaned pt up and changed out bed linen. RN replaced mepilex on left hip.)  Activity Tolerance: Treatment limited secondary to agitation Patient left: in bed;with call bell/phone within reach;with bed alarm set  OT Visit Diagnosis: Other abnormalities of gait and mobility (R26.89);Muscle weakness (generalized) (M62.81);Other symptoms and signs involving cognitive function;Pain Pain - part of body:  (unknown, but pt screaming out when we rolled her to the left  (question pain v. fearv. both))  Time: 7290-2111 OT Time Calculation (min): 19 min Charges:  OT General Charges $OT Visit: 1 Visit OT Evaluation $OT Eval Moderate Complexity: [place], OTR/L Acute [employer] [facility] [phone] Office [phone]    Almon Register 10/21/2022, 4:13 PM

## 2022-10-21 NOTE — Plan of Care (Signed)
Potassium lab 2.6, provider notified.

## 2022-10-21 NOTE — Progress Notes (Signed)
SLP Cancellation Note  Patient Details Name: Melanie Richardson MRN: [id number] DOB: 06/22/1951   Cancelled treatment:       Reason Eval/Treat Not Completed: Patient declined, no reason specified  Pt awake and responding to questions. Refused all PO offered stating "I just don't want anything right now".  Jadie Allington B. Quentin Ore, [facility], [place] Speech Language Pathologist Office: [phone]  Shonna Chock 10/21/2022, 9:26 AM

## 2022-10-21 NOTE — Evaluation (Signed)
Physical Therapy Evaluation Patient Details Name: Melanie Richardson MRN: [id number] DOB: 07-27-1951 Today's Date: 10/21/2022  History of Present Illness  Patient is a 72 years old female with history of some cognitive impairment, multiple sclerosis, hypertension, hypothyroidism, wheelchair-bound status, presented to hospital 11/03/2022  from assisted living facility with confusion, disorientation, mild agitation at the facility.  There was some mention of redness swelling and tenderness of the bilateral lower extremities with an ulceration on the left fifth toe  Clinical Impression  Pt admitted with above diagnosis. Pt currently with functional limitations due to the deficits listed below (see PT Problem List). Pt will benefit from skilled PT to increase their independence and safety with mobility to allow discharge to the venue listed below.      The  patient is resting in bed, curled in fetal position. Patient assisted to roll  and  change bed linens, somewhat resistive at times.  Per report, patient was able to transfer and feed and dress self at ALF. Continue PT for evaluation of functional mobility.   Recommendations for follow up therapy are one component of a multi-disciplinary discharge planning process, led by the attending physician.  Recommendations may be updated based on patient status, additional functional criteria and insurance authorization.  Follow Up Recommendations Skilled nursing-short term rehab (<3 hours/day) Can patient physically be transported by private vehicle: No    Assistance Recommended at Discharge Frequent or constant Supervision/Assistance  Patient can return home with the following  Two people to help with walking and/or transfers;Two people to help with bathing/dressing/bathroom    Equipment Recommendations None recommended by PT  Recommendations for Other Services       Functional Status Assessment Patient has had a recent decline in their functional status  and demonstrates the ability to make significant improvements in function in a reasonable and predictable amount of time.     Precautions / Restrictions Precautions Precautions: Fall Precaution Comments: can be  combative Restrictions Weight Bearing Restrictions: No      Mobility  Bed Mobility Overal bed mobility: Needs Assistance             General bed mobility comments: total A to roll to left with pt screaming and stating "roll be back"; min guard A to roll back to right side    Transfers                        Ambulation/Gait                  Stairs            Wheelchair Mobility    Modified Rankin (Stroke Patients Only)       Balance                                             Pertinent Vitals/Pain Pain Assessment Breathing: normal Negative Vocalization: occasional moan/groan, low speech, negative/disapproving quality Facial Expression: facial grimacing Body Language: tense, distressed pacing, fidgeting Consolability: distracted or reassured by voice/touch PAINAD Score: 5    Home Living Family/patient expects to be discharged to:: Assisted living                 Home Equipment: Shower seat;Grab bars - tub/shower;Grab bars - toilet;Wheelchair - manual      Prior Function Prior Level of Function : Needs assist  Cognitive Assist : ADLs (cognitive)   ADLs (Cognitive): Intermittent cues       Mobility Comments: Per staff Beckie Busing) at [place] on [address] pt is able to transfer in and out of her W/C<>bed on her own and propel her own W/C with her feet. Pt has regular bed no rails ADLs Comments: Per staff Beckie Busing) at [place] on [address] [place] pt is able to transfer W/C to toilet on her own using Grab bar, do her own toileting (clothing and hygiene), can bath and dress herself with S and coaxing, can feed herself     Hand Dominance   Dominant Hand: Right    Extremity/Trunk  Assessment   Upper Extremity Assessment Upper Extremity Assessment: Generalized weakness    Lower Extremity Assessment Lower Extremity Assessment: Difficult to assess due to impaired cognition;RLE deficits/detail;LLE deficits/detail RLE Deficits / Details: did partially lift foot off of bed  when asked to lift foot to place sheet, keeps knee/hip flexed, fetal position LLE Deficits / Details: same as right, dressing on the forefoot.    Cervical / Trunk Assessment Cervical / Trunk Assessment:  (rolled on side in fetal position,  more agitated when placed on back during roll)  Communication   Communication: No difficulties  Cognition Arousal/Alertness: Awake/alert Behavior During Therapy: Restless Overall Cognitive Status: No family/caregiver present to determine baseline cognitive functioning Area of Impairment: Following commands, Awareness                       Following Commands: Follows one step commands inconsistently   Awareness: Intellectual   General Comments: became more agitated with rolling in bed to change linens and get cleaned up.        General Comments General comments (skin integrity, edema, etc.): NT    Exercises     Assessment/Plan    PT Assessment Patient needs continued PT services  PT Problem List Decreased activity tolerance;Decreased cognition;Decreased mobility;Decreased safety awareness;Decreased skin integrity;Decreased knowledge of precautions;Decreased knowledge of use of DME       PT Treatment Interventions      PT Goals (Current goals can be found in the Care Plan section)  Acute Rehab PT Goals PT Goal Formulation: Patient unable to participate in goal setting Time For Goal Achievement: 11/04/22 Potential to Achieve Goals: Fair    Frequency Min 2X/week     Co-evaluation PT/OT/SLP Co-Evaluation/Treatment: Yes Reason for Co-Treatment: Necessary to address cognition/behavior during functional activity;For patient/therapist  safety;To address functional/ADL transfers PT goals addressed during session: Mobility/safety with mobility;Strengthening/ROM OT goals addressed during session: ADL's and self-care;Strengthening/ROM       AM-PAC PT "6 Clicks" Mobility  Outcome Measure                  End of Session   Activity Tolerance: Treatment limited secondary to agitation Patient left: in bed;with bed alarm set;with nursing/sitter in room;Other (comment) (mittens) Nurse Communication: Mobility status PT Visit Diagnosis: Unsteadiness on feet (R26.81);Difficulty in walking, not elsewhere classified (R26.2)    Time: 1456-1530 PT Time Calculation (min) (ACUTE ONLY): 34 min   Charges:   PT Evaluation $PT Eval Low Complexity: [place] Office [phone] Weekend [phone]   Claretha Cooper 10/21/2022, 4:19 PM

## 2022-10-21 NOTE — Progress Notes (Signed)
PROGRESS NOTE    Melanie Richardson  GYI:[id number] DOB: 07-27-1951 DOA: November 15, 2022 PCP: Patient, No Pcp Per    Brief Narrative:  Patient is a 72 years old female with history of some cognitive impairment, multiple sclerosis, hypertension, hypothyroidism, wheelchair-bound status, presented to hospital from assisted living facility with confusion, disorientation, mild agitation at the facility.  There was some mention of redness swelling and tenderness of the bilateral lower extremities with an ulceration on the left fifth toe which was new for the last 2 weeks or so.  In the ED, patient was moderately combative.  Had low-grade fever.  She was noted to have new ulceration on the foot.  Small sacral ulcer was also noted.  Vitals was notable for temperature of 99.9 F.  Laboratory data showed WBC elevated at 12.5 with hemoglobin of 11.6.  BMP was notable for mild hypokalemia at 3.2.  Lactate was 1.5.  COVID and influenza was negative.  Urinalysis was negative for any infection.  Patient received Rocephin IV in the ED, urine culture and blood culture was sent from the ED. Patient was then considered for admission to hospital for further evaluation and treatment.  After hospitalization, patient still remains encephalopathic, podiatry has been consulted with plans for MRI of the foot which is pending.  On broad-spectrum antibiotic with vancomycin and ceftriaxone..  Will likely need debridement but will need to rule out osteomyelitis.   Assessment and Plan:  Altered mental status, likely metabolic encephalopathy with underlying cognitive dysfunction  likely secondary to foot infection.    Chest x-ray without any obvious infiltrate.  Continue antibiotics with vancomycin and  ceftriaxone., closely monitor.  Blood cultures negative in 2 days.  Urine culture negative so far.Marland Kitchen  Unsafe for swallowing.  Speech therapy has been consulted but patient has refused evaluation today.  Still NPO..   Sepsis secondary to  bilateral lower extremity erythema, left little toe ulcer with cellulitis and mild purulence.  Patient  exhibited signs of sepsis including fever, tachycardia tachypnea with foot infection with encephalopathy on presentation.  X-ray foot suggestive of some cortical defect suggestive of possible fracture.   MRI of the foot has been ordered still pending.  ESR of 18.  CRP pending.  Temperature max of 102.4 F over the last 24 hours.  Temperature this morning at 98.6 F.Podiatry on board, follow recommendations..   Cognitive dysfunction.  At baseline she is able to recognize her family members.  Mostly wheelchair-bound.  Recent decline since Christmas as per the family.Marland Kitchen   Hypomagnesemia.  Magnesium of 1.4 today.  Will replace with 2 g of IV magnesium sulfate.  Check magnesium level in AM.  New sacral ulcer with erythema stage I with Stage 2 pressure ulceration.   CT scan of the abdomen pelvis without any acute findings.  No evidence of local soft tissue abscess on the sacral area.  Wound care on board.  Continue local wound care.   Hypokalemia.  Potassium today at 2.6 despite aggressive replacement.  Will continue with a 60 mill equivalents of IV, patient is already on D5 normal saline with potassium in the fluid.  Will continue to replenish aggressively.  History of multiple sclerosis with mild cognitive dysfunction currently at the assisted nursing facility on wheelchair with limited use of legs.  PT OT evaluation pending.   Essential hypertension Will closely monitor blood pressure. On metoprolol as outpatient.  Currently on IV metoprolol due to limited p.o. intake.  Hypothyroidism TSH elevated at 5.5..  On Synthroid at  home.  Currently on hold.  Restart when p.o. able.   Debility weakness.  Patient is wheelchair-bound at the assisted living facility.  Will obtain physical therapy evaluation when appropriate.   DVT prophylaxis: enoxaparin (LOVENOX) injection 40 mg Start: 10/06/2022 2200   Code  Status:     Code Status: DNR  Disposition: Assisted living facility/skilled nursing facility likely in 2 to 3 days..  Status is: Inpatient  The is inpatient because: Sepsis secondary to foot infection, metabolic encephalopathy, IV antibiotic, podiatry evaluation and possible intervention.   Family Communication: Spoke with the patient's son on the phone 10/07/2022  Consultants:  Podiatry  Procedures:  None yet  Antimicrobials:  Vancomycin and Rocephin 1/14>  Anti-infectives (From admission, onward)    Start     Dose/Rate Route Frequency Ordered Stop   10/20/22 1800  vancomycin (VANCOCIN) IVPB 1000 mg/200 mL premix        1,000 mg 200 mL/hr over 60 Minutes Intravenous Every 24 hours 10/11/2022 1839 10/27/22 1759   10/20/22 1800  cefTRIAXone (ROCEPHIN) 2 g in sodium chloride 0.9 % 100 mL IVPB        2 g 200 mL/hr over 30 Minutes Intravenous Every 24 hours 10/20/22 1104     10/20/22 0600  ceFEPIme (MAXIPIME) 2 g in sodium chloride 0.9 % 100 mL IVPB  Status:  Discontinued        2 g 200 mL/hr over 30 Minutes Intravenous Every 12 hours 10/25/2022 1839 10/20/22 1104   11/03/2022 1830  vancomycin (VANCOCIN) IVPB 1000 mg/200 mL premix        1,000 mg 200 mL/hr over 60 Minutes Intravenous  Once 11/05/2022 1828 10/22/2022 1941   10/06/2022 1830  ceFEPIme (MAXIPIME) 2 g in sodium chloride 0.9 % 100 mL IVPB        2 g 200 mL/hr over 30 Minutes Intravenous  Once 10/30/2022 1828 10/21/2022 1857   10/31/2022 1745  cefTRIAXone (ROCEPHIN) 1 g in sodium chloride 0.9 % 100 mL IVPB        1 g 200 mL/hr over 30 Minutes Intravenous  Once 11/04/2022 1736 10/29/2022 1833      Subjective: Today, patient was seen and examined at bedside.  Stating no to few questions.  Little more alert and awake.  On mittens.  Secondary school teacher at bedside.    Objective: Vitals:   10/20/22 2000 10/20/22 2250 10/21/22 0312 10/21/22 0654  BP:  (!) 152/94 (!) 179/89 (!) 189/99  Pulse:  97 100 100  Resp:  20 20 20   Temp:  98.6 F (37  C) 98 F (36.7 C) 98.6 F (37 C)  TempSrc:  Axillary Axillary Axillary  SpO2: 95% 95% 95% 95%  Weight:      Height:        Intake/Output Summary (Last 24 hours) at 10/21/2022 1426 Last data filed at 10/21/2022 1039 Gross per 24 hour  Intake 2500 ml  Output --  Net 2500 ml    Filed Weights   10/27/2022 1234  Weight: 59 kg    Physical Examination: Body mass index is 23.78 kg/m.   General:  alert and awake, saying no to different questions, lying in flexed posture, appears chronically ill and deconditioned. HENT:   No scleral pallor or icterus noted. Oral mucosa is dry. Chest:    Diminished breath sounds bilaterally.  Mild wheezing noted. CVS: S1 &S2 heard. No murmur.  Regular rate and rhythm. Abdomen: Soft, nontender, nondistended.  Bowel sounds are heard.   Extremities: Left foot  with close edema erythema with ulceration and yellowish exudate covered with dressing. Psych: More alert awake today with mild interaction  CNS: More alert awake, mildly Communicative, moves extremities. Skin: Sacral pressure ulceration on admission..  Left foot cellulitis with ulcer.  Data Reviewed:   CBC: Recent Labs  Lab 10/08/2022 1233 10/20/22 0507  WBC 12.5* 8.3  NEUTROABS 10.0*  --   HGB 11.6* 10.3*  HCT 34.5* 30.3*  MCV 91.5 90.4  PLT PLATELET CLUMPS NOTED ON SMEAR, COUNT APPEARS ADEQUATE 288     Basic Metabolic Panel: Recent Labs  Lab 10/09/2022 1233 10/20/22 0507 10/21/22 0554  NA 137 132* 133*  K 3.2* 2.5* 2.6*  CL 101 100 101  CO2 26 22 22   GLUCOSE 99 114* 122*  BUN 13 7* <5*  CREATININE 0.66 0.58 0.54  CALCIUM 8.6* 7.5* 7.2*  MG  --  1.1* 1.4*     Liver Function Tests: Recent Labs  Lab 10/21/2022 1233 10/20/22 0507 10/21/22 0554  AST 28 23 37  ALT 18 15 19   ALKPHOS 56 40 41  BILITOT 0.5 0.9 0.5  PROT 8.1 6.2* 6.3*  ALBUMIN 3.7 2.8* 2.9*      Radiology Studies: VAS 10/16/2022 LOWER EXTREMITY VENOUS (DVT) (ONLY [facility] & [facility])  Result Date: 10/20/2022  Lower Venous  DVT Study Patient Name:  KITANA GAGE  Date of Exam:   10/09/2022 Medical Rec #: Chiquita Loth         Accession #:    10/21/2022 Date of Birth: 08/30/1951          Patient Gender: F Patient Age:   89 years Exam Location:  [facility] Procedure:      VAS 62 LOWER EXTREMITY VENOUS (DVT) Referring Phys: [facility] --------------------------------------------------------------------------------  Indications: Swelling, and Erythema.  Limitations: Quality of study limited due to patient's innability to cooperate (movement and poor positioning) due to altered mental status. Comparison Study: No previous exams Performing Technologist: Jody Hill RVT, RDMS  Examination Guidelines: A complete evaluation includes B-mode imaging, spectral Doppler, color Doppler, and power Doppler as needed of all accessible portions of each vessel. Bilateral testing is considered an integral part of a complete examination. Limited examinations for reoccurring indications may be performed as noted. The reflux portion of the exam is performed with the patient in reverse Trendelenburg.  +---------+---------------+---------+-----------+----------+--------------+ RIGHT    CompressibilityPhasicitySpontaneityPropertiesThrombus Aging +---------+---------------+---------+-----------+----------+--------------+ CFV      Full           Yes      Yes                                 +---------+---------------+---------+-----------+----------+--------------+ SFJ      Full                                                        +---------+---------------+---------+-----------+----------+--------------+ FV Prox  Full           Yes      Yes                                 +---------+---------------+---------+-----------+----------+--------------+ FV Mid   Full           Yes  Yes                                 +---------+---------------+---------+-----------+----------+--------------+ FV DistalFull           Yes       Yes                                 +---------+---------------+---------+-----------+----------+--------------+ PFV      Full                                                        +---------+---------------+---------+-----------+----------+--------------+ POP      Full           Yes      Yes                                 +---------+---------------+---------+-----------+----------+--------------+ PTV      Full                                                        +---------+---------------+---------+-----------+----------+--------------+ PERO     Full                                                        +---------+---------------+---------+-----------+----------+--------------+   +---------+---------------+---------+-----------+----------+--------------+ LEFT     CompressibilityPhasicitySpontaneityPropertiesThrombus Aging +---------+---------------+---------+-----------+----------+--------------+ CFV      Full           Yes      Yes                                 +---------+---------------+---------+-----------+----------+--------------+ SFJ      Full                                                        +---------+---------------+---------+-----------+----------+--------------+ FV Prox  Full           Yes      Yes                                 +---------+---------------+---------+-----------+----------+--------------+ FV Mid   Full           Yes      Yes                                 +---------+---------------+---------+-----------+----------+--------------+ FV DistalFull           Yes      Yes                                 +---------+---------------+---------+-----------+----------+--------------+  PFV      Full                                                        +---------+---------------+---------+-----------+----------+--------------+ POP      Full           Yes      Yes                                  +---------+---------------+---------+-----------+----------+--------------+ PTV                                                   not visualized +---------+---------------+---------+-----------+----------+--------------+ PERO                                                  not visualized +---------+---------------+---------+-----------+----------+--------------+   Left Technical Findings: Not visualized segments include Peroneal and posterior tibial veins.   Summary: BILATERAL: -No evidence of popliteal cyst, bilaterally. RIGHT: - There is no evidence of deep vein thrombosis in the lower extremity.  - Ultrasound characteristics of enlarged lymph nodes are noted in the groin.  LEFT: - There is no evidence of deep vein thrombosis in the lower extremity. However, portions of this examination were limited- see technologist comments above.  - Ultrasound characteristics of enlarged lymph nodes noted in the groin.  *See table(s) above for measurements and observations. Electronically signed by Orlie Pollen on 10/20/2022 at 6:46:01 PM.    Final    CT ABDOMEN PELVIS W CONTRAST  Result Date: 2022-10-24 CLINICAL DATA:  Sacral wound EXAM: CT ABDOMEN AND PELVIS WITH CONTRAST TECHNIQUE: Multidetector CT imaging of the abdomen and pelvis was performed using the standard protocol following bolus administration of intravenous contrast. RADIATION DOSE REDUCTION: This exam was performed according to the departmental dose-optimization program which includes automated exposure control, adjustment of the mA and/or kV according to patient size and/or use of iterative reconstruction technique. CONTRAST:  126mL OMNIPAQUE IOHEXOL 300 MG/ML  SOLN COMPARISON:  None Available. FINDINGS: Lower chest: No acute abnormality Hepatobiliary: No focal hepatic abnormality. Gallbladder unremarkable. Pancreas: No focal abnormality or ductal dilatation. Spleen: No focal abnormality.  Normal size. Adrenals/Urinary Tract: No adrenal  abnormality. No focal renal abnormality. No stones or hydronephrosis. Urinary bladder is unremarkable. Stomach/Bowel: Stomach, large and small bowel grossly unremarkable. Vascular/Lymphatic: Aortic atherosclerosis. No evidence of aneurysm or adenopathy. Reproductive: Uterus and adnexa unremarkable.  No mass. Other: No free fluid or free air. Musculoskeletal: Postoperative changes in the lower lumbar spine. No acute bony abnormality. No bone destruction in the sacrum/coccyx to suggest osteomyelitis. Subcutaneous calcifications within the gluteal regions bilaterally, presumably related to chronic pressure or injection granulomas. No focal fluid collections to suggest abscess. IMPRESSION: No evidence of focal fluid collection or sacral/coccygeal osteo myelitis. No acute findings in the abdomen or pelvis. Electronically Signed   By: Rolm Baptise M.D.   On: October 24, 2022 17:31   CT Head Wo Contrast  Result Date: 10/24/22 CLINICAL DATA:  Mental status change,  unknown cause EXAM: CT HEAD WITHOUT CONTRAST TECHNIQUE: Contiguous axial images were obtained from the base of the skull through the vertex without intravenous contrast. RADIATION DOSE REDUCTION: This exam was performed according to the departmental dose-optimization program which includes automated exposure control, adjustment of the mA and/or kV according to patient size and/or use of iterative reconstruction technique. COMPARISON:  02/14/2019 FINDINGS: Brain: There is atrophy and chronic small vessel disease changes. No acute intracranial abnormality. Specifically, no hemorrhage, hydrocephalus, mass lesion, acute infarction, or significant intracranial injury. Vascular: No hyperdense vessel or unexpected calcification. Skull: No acute calvarial abnormality. Sinuses/Orbits: No acute findings Other: None IMPRESSION: Atrophy, chronic microvascular disease. No acute intracranial abnormality. Electronically Signed   By: Rolm Baptise M.D.   On: 10/18/2022 17:27       LOS: 1 day    Flora Lipps, MD [facility] Available via Epic secure chat 7am-7pm After these hours, please refer to coverage provider listed on [email or web address] 10/21/2022, 2:26 PM

## 2022-10-21 NOTE — Progress Notes (Signed)
OT Cancellation Note  Patient Details Name: Melanie Richardson MRN: [id number] DOB: December 28, 1950   Cancelled Treatment:    Reason Eval/Treat Not Completed: Other (comment). Pt with critical K+ yesterday K+ repleted but level only came up by 0.1 since yesterday. Pt currently getting more K+--will await until all of this is finished today and attempt eval after this time.  [place] Circle, OTR/L [facility] [phone] Office [phone]   Almon Register 10/21/2022, 10:51 AM

## 2022-10-21 NOTE — TOC Initial Note (Signed)
Transition of Care [facility]) - Initial/Assessment Note    Patient Details  Name: Melanie Richardson MRN: [id number] Date of Birth: 1951/08/16  [facility] CM/SW Contact:    Angelita Ingles, RN Phone Number:[phone]  10/21/2022, 8:51 AM  Clinical Narrative:                 [facility] acknowledges consult for SNF placement for patient from ALF. CM awaiting PT evaluation in order to initiate SNF workup. TOC following for recommendations.         Patient Goals and CMS Choice            Expected Discharge Plan and Services                                              Prior Living Arrangements/Services                       Activities of Daily Living      Permission Sought/Granted                  Emotional Assessment              Admission diagnosis:  Hypokalemia [E87.6] Agitation [R45.1] Altered mental status [R41.82] Sepsis ([place]) [A41.9] Altered mental status, unspecified altered mental status type [I29.79] Acute metabolic encephalopathy [G92.11] Patient Active Problem List   Diagnosis Date Noted   Sepsis ([place]) 10/20/2022   Hypomagnesemia 94/17/4081   Acute metabolic encephalopathy 44/81/8563   Cellulitis of fifth toe of left foot 10/12/2022   Sacral decubitus ulcer 11/03/2022   Somnolence    Normocytic anemia 02/12/2019   UTI (urinary tract infection) 02/11/2019   Hypokalemia 02/11/2019   Hyperphosphatemia 02/11/2019   Abnormal LFTs 02/11/2019   Leukocytosis 02/11/2019   Hyperglycemia 02/11/2019   Dehydration 02/10/2019   False positive QuantiFERON-TB Gold test 02/07/2017   High risk medication use 02/04/2017   Right-sided Bell's palsy 12/29/2016   Allergic rhinitis 08/07/2015   Hypertension 08/06/2015   Hypertriglyceridemia 08/06/2015   Anxiety state 07/25/2015   Dysesthesia 10/26/2014   Abnormality of gait 10/26/2014   Weakness 10/26/2014   Sinusitis, acute 07/17/2013   Wheezing 07/17/2013   Hypothyroidism  07/17/2013   MS (multiple sclerosis) ([place])    Trigeminal neuralgia    PCP:  Patient, No Pcp Per Pharmacy:   [facility] [place], [place] [address] [place] [address] Phone: [phone] Fax: [phone]     Social Determinants of Health (SDOH) Social History: SDOH Screenings   Tobacco Use: High Risk (05/22/2022)   SDOH Interventions:     Readmission Risk Interventions     No data to display

## 2022-10-21 NOTE — Progress Notes (Signed)
   PODIATRY PROGRESS NOTE  NAME ARDENE REMLEY MRN [id number] DOB [birth year] DOA 05-Nov-2022   Reason for consult:  Chief Complaint  Patient presents with   Altered Mental Status     History of present illness: 72 y.o. female admitted for altered mental status.  Found to have a pulse on the left foot with redness for the last 2 weeks.  Upon admission x-rays were obtained and she was started on broad-spectrum antibiotics.  Currently she denies any pain.  Vitals:   10/21/22 0312 10/21/22 0654  BP: (!) 179/89 (!) 189/99  Pulse: 100 100  Resp: 20 20  Temp: 98 F (36.7 C) 98.6 F (37 C)  SpO2: 95% 95%       Latest Ref Rng & Units 10/20/2022    5:07 AM 05-Nov-2022   12:33 PM 11/21/2021    4:11 PM  CBC  WBC 4.0 - 10.5 K/uL 8.3  12.5  5.9   Hemoglobin 12.0 - 15.0 g/dL 10.3  11.6  13.0   Hematocrit 36.0 - 46.0 % 30.3  34.5  37.9   Platelets 150 - 400 K/uL 288  PLATELET CLUMPS NOTED ON SMEAR, COUNT APPEARS ADEQUATE  266        Latest Ref Rng & Units 10/21/2022    5:54 AM 10/20/2022    5:07 AM 2022-11-05   12:33 PM  BMP  Glucose 70 - 99 mg/dL 122  114  99   BUN 8 - 23 mg/dL 5  7  13    Creatinine 0.44 - 1.00 mg/dL 0.54  0.58  0.66   Sodium 135 - 145 mmol/L 133  132  137   Potassium 3.5 - 5.1 mmol/L 2.6  2.5  3.2   Chloride 98 - 111 mmol/L 101  100  101   CO2 22 - 32 mmol/L 22  22  26    Calcium 8.9 - 10.3 mg/dL 7.2  7.5  8.6       Physical Exam: General: NAD   Dermatology: Full-thickness ulceration noted to the dorsal aspect the left fifth MPJ with some granulation tissue but there is some purulent drainage noted.  There is no exposed tendon or bone.  There is surrounding edema and erythema that appears to be somewhat improved.  There is no fluctuance or crepitation.   Vascular: Palpable DP pulse  Neurological: Sensation decreased  Musculoskeletal Exam: No significant pain on exam    ASSESSMENT/PLAN OF CARE  Left foot ulceration, cellulitis, concern for  osteomyelitis  -Dressing was changed today.  Patient is alert and able to verbalize no pain.   -Awaiting MRI. -Continue broad-spectrum antibiotics -Offloading. -WB status: Patient is nonambulatory -She has palpable pulses so we will hold off on arterial studies for now.  -I called and updated the patient's son, [name].     Please contact me directly with any questions or concerns.     Celesta Gentile, DPM [facility]  Dr. Bonna Gains. [address], [address] [place], [place] [address]                Office [phone]  Fax [phone]

## 2022-10-21 NOTE — Plan of Care (Signed)
Patient AOX2, disoriented to date and situation.  Confused at baseline and tries to pull out PIV.  New PIV placed and Tele observer called out several times to pt to stop pulling lines, pt re-oriented and educated on not pulling lines.  Finally provider notified and restraint orders placed around 1am.  Pt remains NPO, all meds given on time as ordered.  VSS throughout shift.  Wheezes auscultated, IS encouraged.  Pt had multiple incontinence episodes of bowel and bladder.  Full linen changed.  POC maintained, will continue to monitor.  Problem: Education: Goal: Knowledge of General Education information will improve Description: Including pain rating scale, medication(s)/side effects and non-pharmacologic comfort measures Outcome: Progressing   Problem: Health Behavior/Discharge Planning: Goal: Ability to manage health-related needs will improve Outcome: Progressing   Problem: Clinical Measurements: Goal: Ability to maintain clinical measurements within normal limits will improve Outcome: Progressing Goal: Will remain free from infection Outcome: Progressing Goal: Diagnostic test results will improve Outcome: Progressing Goal: Respiratory complications will improve Outcome: Progressing Goal: Cardiovascular complication will be avoided Outcome: Progressing   Problem: Activity: Goal: Risk for activity intolerance will decrease Outcome: Progressing   Problem: Nutrition: Goal: Adequate nutrition will be maintained Outcome: Progressing   Problem: Coping: Goal: Level of anxiety will decrease Outcome: Progressing   Problem: Elimination: Goal: Will not experience complications related to bowel motility Outcome: Progressing Goal: Will not experience complications related to urinary retention Outcome: Progressing   Problem: Pain Managment: Goal: General experience of comfort will improve Outcome: Progressing   Problem: Safety: Goal: Ability to remain free from injury will  improve Outcome: Progressing   Problem: Skin Integrity: Goal: Risk for impaired skin integrity will decrease Outcome: Progressing   Problem: Safety: Goal: Non-violent Restraint(s) Outcome: Progressing

## 2022-10-21 NOTE — TOC Initial Note (Signed)
Transition of Care [facility]) - Initial/Assessment Note    Patient Details  Name: Melanie Richardson MRN: [id number] Date of Birth: 08/11/1951  Transition [facility]) CM/SW Contact:    Angelita Ingles, RN Phone Number:[phone]  10/21/2022, 2:37 PM  Clinical Narrative:                 [facility] acknowledges consult to monitor patient admitted from ALF. CM spoke with son Lennette Bihari who confirms that patient comes from [facility] on [address]. Currently there are no therapy evaluations. TOC will to monitor for disposition planning.         Patient Goals and CMS Choice            Expected Discharge Plan and Services                                              Prior Living Arrangements/Services                       Activities of Daily Living      Permission Sought/Granted                  Emotional Assessment              Admission diagnosis:  Hypokalemia [E87.6] Agitation [R45.1] Altered mental status [R41.82] Sepsis ([place]) [A41.9] Altered mental status, unspecified altered mental status type [T41.96] Acute metabolic encephalopathy [Q22.97] Patient Active Problem List   Diagnosis Date Noted   Sepsis ([place]) 10/20/2022   Hypomagnesemia 98/92/1194   Acute metabolic encephalopathy 17/40/8144   Cellulitis of fifth toe of left foot 10/13/2022   Sacral decubitus ulcer 10/09/2022   Somnolence    Normocytic anemia 02/12/2019   UTI (urinary tract infection) 02/11/2019   Hypokalemia 02/11/2019   Hyperphosphatemia 02/11/2019   Abnormal LFTs 02/11/2019   Leukocytosis 02/11/2019   Hyperglycemia 02/11/2019   Dehydration 02/10/2019   False positive QuantiFERON-TB Gold test 02/07/2017   High risk medication use 02/04/2017   Right-sided Bell's palsy 12/29/2016   Allergic rhinitis 08/07/2015   Hypertension 08/06/2015   Hypertriglyceridemia 08/06/2015   Anxiety state 07/25/2015   Dysesthesia 10/26/2014   Abnormality of gait 10/26/2014    Weakness 10/26/2014   Sinusitis, acute 07/17/2013   Wheezing 07/17/2013   Hypothyroidism 07/17/2013   MS (multiple sclerosis) ([place])    Trigeminal neuralgia    PCP:  Patient, No Pcp Per Pharmacy:   [facility] [place], [place] [address] [place] [address] Phone: [phone] Fax: [phone]     Social Determinants of Health (SDOH) Social History: SDOH Screenings   Tobacco Use: High Risk (05/22/2022)   SDOH Interventions:     Readmission Risk Interventions     No data to display

## 2022-10-22 DIAGNOSIS — I10 Essential (primary) hypertension: Secondary | ICD-10-CM | POA: Diagnosis not present

## 2022-10-22 DIAGNOSIS — G35 Multiple sclerosis: Secondary | ICD-10-CM | POA: Diagnosis not present

## 2022-10-22 DIAGNOSIS — G9341 Metabolic encephalopathy: Secondary | ICD-10-CM | POA: Diagnosis not present

## 2022-10-22 DIAGNOSIS — L03032 Cellulitis of left toe: Secondary | ICD-10-CM | POA: Diagnosis not present

## 2022-10-22 LAB — BASIC METABOLIC PANEL
Anion gap: 7 (ref 5–15)
BUN: <5 mg/dL — ABNORMAL LOW (ref 8–23)
CO2: 23 mmol/L (ref 22–32)
Calcium: 7.3 mg/dL — ABNORMAL LOW (ref 8.9–10.3)
Chloride: 102 mmol/L (ref 98–111)
Creatinine, Ser: 0.58 mg/dL (ref 0.44–1.00)
GFR, Estimated: >60 mL/min (ref >60)
Glucose, Bld: 121 mg/dL — ABNORMAL HIGH (ref 70–99)
Potassium: 2.6 mmol/L — CL (ref 3.5–5.1)
Sodium: 132 mmol/L — ABNORMAL LOW (ref 135–145)

## 2022-10-22 LAB — CBC
HCT: 30.6 % — ABNORMAL LOW (ref 36.0–46.0)
Hemoglobin: 10.6 g/dL — ABNORMAL LOW (ref 12.0–15.0)
MCH: 30.2 pg (ref 26.0–34.0)
MCHC: 34.6 g/dL (ref 30.0–36.0)
MCV: 87.2 fL (ref 80.0–100.0)
Platelets: 296 10*3/uL (ref 150–400)
RBC: 3.51 MIL/uL — ABNORMAL LOW (ref 3.87–5.11)
RDW: 12.5 % (ref 11.5–15.5)
WBC: 9 10*3/uL (ref 4.0–10.5)
nRBC: 0 % (ref 0.0–0.2)

## 2022-10-22 LAB — MAGNESIUM: Magnesium: 1.6 mg/dL — ABNORMAL LOW (ref 1.7–2.4)

## 2022-10-22 MED ORDER — KCL IN DEXTROSE-NACL 20-5-0.45 MEQ/L-%-% IV SOLN
INTRAVENOUS | Status: DC
  Filled 2022-10-22 (×4): qty 1000

## 2022-10-22 MED ORDER — POTASSIUM CHLORIDE 20 MEQ PO PACK
40.0000 meq | PACK | Freq: Two times a day (BID) | ORAL | Status: DC
  Administered 2022-10-22: 40 meq via ORAL
  Filled 2022-10-22 (×2): qty 2

## 2022-10-22 MED ORDER — CARBAMAZEPINE ER 200 MG PO TB12
200.0000 mg | ORAL_TABLET | Freq: Three times a day (TID) | ORAL | Status: DC
  Administered 2022-10-24: 200 mg via ORAL
  Filled 2022-10-22 (×4): qty 1

## 2022-10-22 MED ORDER — METOPROLOL SUCCINATE ER 25 MG PO TB24
25.0000 mg | ORAL_TABLET | Freq: Every day | ORAL | Status: DC
  Administered 2022-10-22 – 2022-10-24 (×2): 25 mg via ORAL
  Filled 2022-10-22 (×3): qty 1

## 2022-10-22 MED ORDER — MAGNESIUM SULFATE 2 GM/50ML IV SOLN
2.0000 g | Freq: Once | INTRAVENOUS | Status: AC
  Administered 2022-10-22: 2 g via INTRAVENOUS
  Filled 2022-10-22: qty 50

## 2022-10-22 MED ORDER — POTASSIUM CHLORIDE 10 MEQ/100ML IV SOLN
10.0000 meq | INTRAVENOUS | Status: AC
  Administered 2022-10-22 (×4): 10 meq via INTRAVENOUS
  Filled 2022-10-22 (×4): qty 100

## 2022-10-22 MED ORDER — LEVOTHYROXINE SODIUM 50 MCG PO TABS
75.0000 ug | ORAL_TABLET | Freq: Every day | ORAL | Status: DC
  Administered 2022-10-24: 75 ug via ORAL
  Filled 2022-10-22 (×3): qty 1

## 2022-10-22 NOTE — Progress Notes (Signed)
SLP Cancellation Note  Patient Details Name: Melanie Richardson MRN: [id number] DOB: 1951-02-17   Cancelled treatment:       Reason Eval/Treat Not Completed: Despite RN and SLP working together to encourage patient to participate, she did not cooperate with any part of the evaluation. Oral care was allowed only briefly. Pt declined ice chips and sips of water via straw. Pt held her hands up, covering her face throughout this session. Pt slowly became agitated, swatting at SLP. She would intermittently answer yes/no questions, but did not verbalize.  Unable to recommend a safe PO diet at this time, due to pt refusal to participate in swallow evaluation. Will continue efforts. Recommend consideration of non-oral feeding method if pt continues to refuse PO intake.  Damaris Abeln B. Quentin Ore, [facility] [place] Speech Language Pathologist Office: [phone]  Shonna Chock 10/22/2022, 12:05 PM

## 2022-10-22 NOTE — H&P (View-Only) (Signed)
   PODIATRY PROGRESS NOTE  NAME Melanie Richardson MRN [id number] DOB 05-21-1951 DOA 2022-11-18   Spoke with patient's son today via telephone, Melanie Richardson, regarding CT findings of osteomyelitis.  Recommend fifth toe and partial ray amputation of the left foot.  Patient's son agrees and is comfortable to proceed.  LT foot 10/21/2022  CT FOOT LT W CONTRAST 10/21/2022 IMPRESSION: Soft tissue ulceration laterally in the foot overlying the 5th metatarsal head where there is cortical/bony destruction concerning for osteomyelitis.   ASSESSMENT/PLAN OF CARE Osteomyelitis fifth metatarsal left foot -Will plan for partial fifth ray amputation left foot with possible bone biopsy. Planning for surgery Friday afternoon. -Preoperative orders placed.  NPO after midnight beginning 10/24/2021  -Continue daily dressing changes.  -Will follow   Please contact me directly via secure chat with any questions or concerns.     Edrick Kins, DPM [facility]  Dr. Edrick Kins, DPM    [address], [place] [address]                Office [phone]  Fax [phone]

## 2022-10-22 NOTE — TOC Progression Note (Addendum)
Transition of Care [facility]) - Progression Note    Patient Details  Name: Melanie Richardson MRN: [id number] Date of Birth: Feb 14, 1951  Transition [facility] Care [facility] CM/SW [place], RN Phone Number:[phone]  10/22/2022, 10:42 AM  Clinical Narrative:    CM called son Melanie Richardson to discuss PT recommendations for SNF. Per son patient has not been mobile for for some time now. Son states patients baseline is wheelchair bound. Son would like for patient to return to ALF if at all possible. CM attempted to follow up with facility for assessment to see if patient is able to return to ALF. Voicemail has been left. Will await return call.   Klemme received return call from Melanie Richardson who states that she is the nurse from [place]. Per Melanie Richardson patients baseline is wheelchair bound due to [place]. Melanie Richardson states that patient is fine to return to [facility]  where she is functional in her wheelchair.         Expected Discharge Plan and Services                                               Social Determinants of Health (SDOH) Interventions SDOH Screenings   Tobacco Use: High Risk (05/22/2022)    Readmission Risk Interventions     No data to display

## 2022-10-22 NOTE — Progress Notes (Signed)
Patient found pulling her hair out and hitting herself in the head. Mitts reapplied to deter self harm. Patient reoriented encouraged to not hurt herself.

## 2022-10-22 NOTE — Plan of Care (Signed)
Patient AOX2, disoriented to date and situation. Confused at baseline and tries to pull out PIV. Tele observer called out several times to pt to stop pulling lines, pt re-oriented and educated on not pulling lines. Pt remains NPO, all meds given on time as ordered. VSS throughout shift. Wheezes auscultated, IS encouraged and PRN albuterol given. Pt had multiple incontinence episodes of bowel and bladder. Full linen changed several times. POC maintained, will continue to monitor.   Problem: Education: Goal: Knowledge of General Education information will improve Description: Including pain rating scale, medication(s)/side effects and non-pharmacologic comfort measures Outcome: Progressing   Problem: Health Behavior/Discharge Planning: Goal: Ability to manage health-related needs will improve Outcome: Progressing   Problem: Clinical Measurements: Goal: Ability to maintain clinical measurements within normal limits will improve Outcome: Progressing Goal: Will remain free from infection Outcome: Progressing Goal: Diagnostic test results will improve Outcome: Progressing Goal: Respiratory complications will improve Outcome: Progressing Goal: Cardiovascular complication will be avoided Outcome: Progressing   Problem: Activity: Goal: Risk for activity intolerance will decrease Outcome: Progressing   Problem: Nutrition: Goal: Adequate nutrition will be maintained Outcome: Progressing   Problem: Coping: Goal: Level of anxiety will decrease Outcome: Progressing   Problem: Elimination: Goal: Will not experience complications related to bowel motility Outcome: Progressing Goal: Will not experience complications related to urinary retention Outcome: Progressing   Problem: Pain Managment: Goal: General experience of comfort will improve Outcome: Progressing   Problem: Safety: Goal: Ability to remain free from injury will improve Outcome: Progressing   Problem: Skin  Integrity: Goal: Risk for impaired skin integrity will decrease Outcome: Progressing   Problem: Safety: Goal: Non-violent Restraint(s) Outcome: Progressing

## 2022-10-22 NOTE — Progress Notes (Signed)
Critical result called from lab on patient.  Potassium 2.6.  MD notified

## 2022-10-22 NOTE — Progress Notes (Signed)
PROGRESS NOTE    Melanie Richardson  YNW:[id number] DOB: Feb 06, 1951 DOA: 10/14/2022 PCP: Patient, No Pcp Per    Brief Narrative:  Patient is a 72 years old female with history of some cognitive impairment, multiple sclerosis, hypertension, hypothyroidism, wheelchair-bound status, presented to hospital from assisted living facility with confusion, disorientation, mild agitation at the facility.  There was some mention of redness, swelling and tenderness of the bilateral lower extremities with an ulceration on the left fifth toe which was new for the last 2 weeks or so.  In the ED, patient was moderately combative.  Had low-grade fever.  She was noted to have new ulceration on the foot.  Small sacral ulcer was also noted.  Vitals was notable for temperature of 99.9 F.  Laboratory data showed WBC elevated at 12.5 with hemoglobin of 11.6.  BMP was notable for mild hypokalemia at 3.2.  Lactate was 1.5.  COVID and influenza was negative.  Urinalysis was negative for any infection.  Patient received Rocephin IV in the ED, urine culture and blood culture was sent from the ED. Patient was then considered for admission to hospital for further evaluation and treatment.  After hospitalization, patient was encephalopathic, podiatry was consulted with CT scan of the foot with evidence of osteomyelitis.  Podiatry to discuss with the family regarding further steps..  On broad-spectrum antibiotic with vancomycin and ceftriaxone.   Assessment and Plan:  Altered mental status, likely metabolic encephalopathy with underlying cognitive dysfunction  likely secondary to foot infection.  Slightly improved today.  Patient is more alert awake and Communicative.  Chest x-ray without any obvious infiltrate.  Continue antibiotics with vancomycin and  ceftriaxone.  Blood cultures negative in 2 days.  Urine culture negative so far. Speech therapy has been consulted for oral safety.  Restart carbamazepine from home.   Sepsis secondary   left foot  cellulitis and fifth metatarsal osteomyelitis.   ESR of 18.  CT of the foot with fifth metatarsal osteomyelitis.  Temperature has improved.  Cognitive dysfunction.  At baseline she is able to recognize her family members.  Mostly wheelchair-bound.  Recent decline since Christmas as per the family.Marland Kitchen   Hypomagnesemia.  Magnesium of 1.6 today.  Will replace with 2 g of IV magnesium sulfate.  Check magnesium level in AM.   Hypokalemia.  Potassium today at 2.6 despite aggressive replacement.  Continue IV KCl, oral KCl and 2 g of magnesium sulfate today.  Check levels in AM.  History of multiple sclerosis with mild cognitive dysfunction currently at the assisted nursing facility on wheelchair with limited use of legs.  PT/OT recommends skilled nursing facility at this time.   Essential hypertension Will closely monitor blood pressure. On metoprolol as outpatient.  Currently on IV metoprolol due to limited p.o. intake.  Will resume p.o. metoprolol when p.o. okay.  Blood pressure seems to be controlled.  Latest blood pressure at 112/64.  Hypothyroidism TSH elevated at 5.5.  On Synthroid at home.  Will resume when p.o. okay.  stage I with Stage 2 chronic pressure ulceration.  Present on admission.  CT scan of the abdomen pelvis without any acute findings.  No evidence of local soft tissue abscess on the sacral area.  Wound care on board.  Continue local wound care.   Debility weakness.  Patient is wheelchair-bound at the assisted living facility.  Will obtain physical therapy evaluation when appropriate.   DVT prophylaxis: enoxaparin (LOVENOX) injection 40 mg Start: 10/07/2022 2200   Code Status:  Code Status: DNR  Disposition: Assisted living facility/skilled nursing facility likely in 2 to 3 days..  Status is: Inpatient  The is inpatient because: Sepsis secondary to foot infection, metabolic encephalopathy, IV antibiotic, podiatry evaluation and possible intervention.   Family  Communication:  Spoke with the patient's son Melanie Richardson on the phone 10/22/2022 and updated him about the clinical condition of the patient.  Consultants:  Podiatry  Procedures:  None yet  Antimicrobials:  Vancomycin and Rocephin 1/14>  Subjective: Today, patient was seen and examined at bedside.  He is more alert awake and Communicative.  Answering few questions.  Answering appropriately.  Denies overt pain.  Objective: Vitals:   10/21/22 0312 10/21/22 0654 10/21/22 1943 10/22/22 0407  BP: (!) 179/89 (!) 189/99 (!) 127/91 112/64  Pulse: 100 100 97   Resp: 20 20 18    Temp: 98 F (36.7 C) 98.6 F (37 C) 98 F (36.7 C) 98.7 F (37.1 C)  TempSrc: Axillary Axillary Axillary Axillary  SpO2: 95% 95% 94% 95%  Weight:      Height:        Intake/Output Summary (Last 24 hours) at 10/22/2022 1138 Last data filed at 10/21/2022 2004 Gross per 24 hour  Intake 1525.67 ml  Output --  Net 1525.67 ml    Filed Weights   10/31/2022 1234  Weight: 59 kg    Physical Examination: Body mass index is 23.78 kg/m.   General: Alert awake and Communicative.  Answering questions appropriately. HENT:   No scleral pallor or icterus noted. Oral mucosa is moist Chest:   Decreased breath sounds bilaterally, clear breath sounds.   CVS: S1 &S2 heard. No murmur.  Regular rate and rhythm. Abdomen: Soft, nontender, nondistended.  Bowel sounds are heard.   Extremities: Left foot with dressing.   Psych: More alert awake Communicative CNS: More alert awake, Communicative, moves all extremities. Skin: Sacral pressure ulceration on admission..  Left foot cellulitis with ulcer covered with dressing..  Data Reviewed:   CBC: Recent Labs  Lab 10/25/2022 1233 10/20/22 0507 10/22/22 0642  WBC 12.5* 8.3 9.0  NEUTROABS 10.0*  --   --   HGB 11.6* 10.3* 10.6*  HCT 34.5* 30.3* 30.6*  MCV 91.5 90.4 87.2  PLT PLATELET CLUMPS NOTED ON SMEAR, COUNT APPEARS ADEQUATE 288 296     Basic Metabolic  Panel: Recent Labs  Lab 10/20/2022 1233 10/20/22 0507 10/21/22 0554 10/22/22 0642  NA 137 132* 133* 132*  K 3.2* 2.5* 2.6* 2.6*  CL 101 100 101 102  CO2 26 22 22 23   GLUCOSE 99 114* 122* 121*  BUN 13 7* <5* <5*  CREATININE 0.66 0.58 0.54 0.58  CALCIUM 8.6* 7.5* 7.2* 7.3*  MG  --  1.1* 1.4* 1.6*     Liver Function Tests: Recent Labs  Lab 10/27/2022 1233 10/20/22 0507 10/21/22 0554  AST 28 23 37  ALT 18 15 19   ALKPHOS 56 40 41  BILITOT 0.5 0.9 0.5  PROT 8.1 6.2* 6.3*  ALBUMIN 3.7 2.8* 2.9*      Radiology Studies: CT FOOT LEFT W CONTRAST  Result Date: 10/21/2022 CLINICAL DATA:  Osteomyelitis suspected. Foot infection, cellulitis, drainage. EXAM: CT OF THE LOWER LEFT EXTREMITY WITH CONTRAST TECHNIQUE: Multidetector CT imaging of the lower left extremity was performed according to the standard protocol following intravenous contrast administration. RADIATION DOSE REDUCTION: This exam was performed according to the departmental dose-optimization program which includes automated exposure control, adjustment of the mA and/or kV according to patient size and/or  use of iterative reconstruction technique. CONTRAST:  116mL OMNIPAQUE IOHEXOL 300 MG/ML  SOLN COMPARISON:  Plain films 11-Nov-2022 FINDINGS: Bones/Joint/Cartilage Bone destruction in the 5th metatarsal head concerning for osteomyelitis. No fracture, subluxation or dislocation. Ligaments Suboptimally assessed by CT. Muscles and Tendons Grossly unremarkable Soft tissues Soft tissue swelling and soft tissue defect/ulceration noted over the 5th metatarsal head. IMPRESSION: Soft tissue ulceration laterally in the foot overlying the 5th metatarsal head where there is cortical/bony destruction concerning for osteomyelitis. Electronically Signed   By: Rolm Baptise M.D.   On: 10/21/2022 21:20      LOS: 2 days    Flora Lipps, MD [facility] Available via Epic secure chat 7am-7pm After these hours, please refer to coverage  provider listed on [email or web address] 10/22/2022, 11:38 AM

## 2022-10-22 NOTE — Progress Notes (Signed)
   PODIATRY PROGRESS NOTE  NAME Melanie Richardson MRN [id number] DOB 11/01/1950 DOA 10/17/2022   Spoke with patient's son today via telephone, Andy Cory, regarding CT findings of osteomyelitis.  Recommend fifth toe and partial ray amputation of the left foot.  Patient's son agrees and is comfortable to proceed.  LT foot 10/21/2022  CT FOOT LT W CONTRAST 10/21/2022 IMPRESSION: Soft tissue ulceration laterally in the foot overlying the 5th metatarsal head where there is cortical/bony destruction concerning for osteomyelitis.   ASSESSMENT/PLAN OF CARE Osteomyelitis fifth metatarsal left foot -Will plan for partial fifth ray amputation left foot with possible bone biopsy. Planning for surgery Friday afternoon. -Preoperative orders placed.  NPO after midnight beginning 10/24/2021  -Continue daily dressing changes.  -Will follow   Please contact me directly via secure chat with any questions or concerns.     Jasir Rother M. Holliday Sheaffer, DPM [facility]  Dr. Jamiee Milholland M. Findley Blankenbaker, DPM    [address]                                        [place], [place] [address]                Office [phone]  Fax [phone]     

## 2022-10-23 ENCOUNTER — Inpatient Hospital Stay (HOSPITAL_COMMUNITY): Payer: Medicare Other | Admitting: Anesthesiology

## 2022-10-23 ENCOUNTER — Encounter (HOSPITAL_COMMUNITY): Admission: EM | Disposition: E | Payer: Self-pay | Source: Skilled Nursing Facility | Attending: Internal Medicine

## 2022-10-23 ENCOUNTER — Other Ambulatory Visit: Payer: Self-pay

## 2022-10-23 DIAGNOSIS — F17210 Nicotine dependence, cigarettes, uncomplicated: Secondary | ICD-10-CM

## 2022-10-23 DIAGNOSIS — I10 Essential (primary) hypertension: Secondary | ICD-10-CM

## 2022-10-23 DIAGNOSIS — E039 Hypothyroidism, unspecified: Secondary | ICD-10-CM

## 2022-10-23 DIAGNOSIS — F419 Anxiety disorder, unspecified: Secondary | ICD-10-CM

## 2022-10-23 DIAGNOSIS — G35 Multiple sclerosis: Secondary | ICD-10-CM

## 2022-10-23 DIAGNOSIS — M869 Osteomyelitis, unspecified: Secondary | ICD-10-CM

## 2022-10-23 DIAGNOSIS — G9341 Metabolic encephalopathy: Secondary | ICD-10-CM

## 2022-10-23 DIAGNOSIS — L03032 Cellulitis of left toe: Secondary | ICD-10-CM

## 2022-10-23 DIAGNOSIS — R531 Weakness: Secondary | ICD-10-CM

## 2022-10-23 HISTORY — PX: AMPUTATION TOE: SHX6595

## 2022-10-23 LAB — BASIC METABOLIC PANEL
Anion gap: 6 (ref 5–15)
BUN: <5 mg/dL — ABNORMAL LOW (ref 8–23)
CO2: 19 mmol/L — ABNORMAL LOW (ref 22–32)
Calcium: 7.6 mg/dL — ABNORMAL LOW (ref 8.9–10.3)
Chloride: 107 mmol/L (ref 98–111)
Creatinine, Ser: 0.51 mg/dL (ref 0.44–1.00)
GFR, Estimated: >60 mL/min (ref >60)
Glucose, Bld: 114 mg/dL — ABNORMAL HIGH (ref 70–99)
Potassium: 3.5 mmol/L (ref 3.5–5.1)
Sodium: 132 mmol/L — ABNORMAL LOW (ref 135–145)

## 2022-10-23 LAB — CBC
HCT: 32.7 % — ABNORMAL LOW (ref 36.0–46.0)
Hemoglobin: 11.3 g/dL — ABNORMAL LOW (ref 12.0–15.0)
MCH: 30.5 pg (ref 26.0–34.0)
MCHC: 34.6 g/dL (ref 30.0–36.0)
MCV: 88.4 fL (ref 80.0–100.0)
Platelets: 318 10*3/uL (ref 150–400)
RBC: 3.7 MIL/uL — ABNORMAL LOW (ref 3.87–5.11)
RDW: 12.6 % (ref 11.5–15.5)
WBC: 6.9 10*3/uL (ref 4.0–10.5)
nRBC: 0 % (ref 0.0–0.2)

## 2022-10-23 LAB — MAGNESIUM: Magnesium: 1.7 mg/dL (ref 1.7–2.4)

## 2022-10-23 SURGERY — AMPUTATION, TOE
Anesthesia: Monitor Anesthesia Care | Site: Toe | Laterality: Left

## 2022-10-23 MED ORDER — LIDOCAINE HCL 1 % IJ SOLN
INTRAMUSCULAR | Status: DC | PRN
  Administered 2022-10-23: 12 mL

## 2022-10-23 MED ORDER — LIDOCAINE HCL (PF) 1 % IJ SOLN
INTRAMUSCULAR | Status: AC
  Filled 2022-10-23: qty 30

## 2022-10-23 MED ORDER — PROPOFOL 10 MG/ML IV BOLUS
INTRAVENOUS | Status: DC | PRN
  Administered 2022-10-23: 60 mg via INTRAVENOUS
  Administered 2022-10-23: 20 mg via INTRAVENOUS
  Administered 2022-10-23: 30 mg via INTRAVENOUS

## 2022-10-23 MED ORDER — LIDOCAINE 2% (20 MG/ML) 5 ML SYRINGE
INTRAMUSCULAR | Status: DC | PRN
  Administered 2022-10-23: 50 mg via INTRAVENOUS

## 2022-10-23 MED ORDER — FENTANYL CITRATE PF 50 MCG/ML IJ SOSY: 25.0000 ug | PREFILLED_SYRINGE | INTRAMUSCULAR | Status: DC | PRN

## 2022-10-23 MED ORDER — PROPOFOL 500 MG/50ML IV EMUL
INTRAVENOUS | Status: AC
  Filled 2022-10-23: qty 50

## 2022-10-23 MED ORDER — LACTATED RINGERS IV SOLN: INTRAVENOUS | Status: DC

## 2022-10-23 MED ORDER — 0.9 % SODIUM CHLORIDE (POUR BTL) OPTIME
TOPICAL | Status: DC | PRN
  Administered 2022-10-23: 1000 mL

## 2022-10-23 MED ORDER — BUPIVACAINE HCL (PF) 0.5 % IJ SOLN
INTRAMUSCULAR | Status: AC
  Filled 2022-10-23: qty 30

## 2022-10-23 MED ORDER — ONDANSETRON HCL 4 MG/2ML IJ SOLN: 4.0000 mg | Freq: Once | INTRAMUSCULAR | Status: DC | PRN

## 2022-10-23 MED ORDER — PROPOFOL 500 MG/50ML IV EMUL
INTRAVENOUS | Status: DC | PRN
  Administered 2022-10-23: 75 ug/kg/min via INTRAVENOUS

## 2022-10-23 SURGICAL SUPPLY — 35 items
BAG COUNTER SPONGE SURGICOUNT (BAG) IMPLANT
BLADE AVERAGE 25X9 (BLADE) IMPLANT
BLADE OSC/SAG .038X5.5 CUT EDG (BLADE) IMPLANT
BLADE SURG 15 STRL LF DISP TIS (BLADE) IMPLANT
BLADE SURG 15 STRL SS (BLADE)
BNDG CONFORM 4 STRL LF (GAUZE/BANDAGES/DRESSINGS) ×1 IMPLANT
BNDG ELASTIC 4X5.8 VLCR STR LF (GAUZE/BANDAGES/DRESSINGS) IMPLANT
BNDG ELASTIC 6X5.8 VLCR STR LF (GAUZE/BANDAGES/DRESSINGS) ×1 IMPLANT
CLEANER TIP ELECTROSURG 2X2 (MISCELLANEOUS) IMPLANT
CNTNR URN SCR LID CUP LEK RST (MISCELLANEOUS) IMPLANT
CONT SPEC 4OZ STRL OR WHT (MISCELLANEOUS)
COVER SURGICAL LIGHT HANDLE (MISCELLANEOUS) ×1 IMPLANT
CUFF TOURN SGL QUICK 18 (TOURNIQUET CUFF) IMPLANT
GAUZE SPONGE 4X4 12PLY STRL (GAUZE/BANDAGES/DRESSINGS) ×1 IMPLANT
GAUZE XEROFORM 1X8 LF (GAUZE/BANDAGES/DRESSINGS) ×1 IMPLANT
GLOVE BIO SURGEON STRL SZ7.5 (GLOVE) ×1 IMPLANT
GLOVE BIOGEL PI IND STRL 8 (GLOVE) ×1 IMPLANT
GOWN SPEC L4 XLG W/TWL (GOWN DISPOSABLE) ×1 IMPLANT
HANDPIECE INTERPULSE COAX TIP (DISPOSABLE)
KIT BASIN OR (CUSTOM PROCEDURE TRAY) ×1 IMPLANT
NDL HYPO 25X1 1.5 SAFETY (NEEDLE) ×1 IMPLANT
NEEDLE HYPO 25X1 1.5 SAFETY (NEEDLE) ×1 IMPLANT
PACK ORTHO EXTREMITY (CUSTOM PROCEDURE TRAY) ×1 IMPLANT
PADDING UNDERCAST 2X4 STRL (CAST SUPPLIES) ×1 IMPLANT
PENCIL SMOKE EVACUATOR (MISCELLANEOUS) ×1 IMPLANT
SET HNDPC FAN SPRY TIP SCT (DISPOSABLE) IMPLANT
SPIKE FLUID TRANSFER (MISCELLANEOUS) IMPLANT
STAPLER VISISTAT 35W (STAPLE) ×1 IMPLANT
SUT ETHILON 4 0 PS 2 18 (SUTURE) ×1 IMPLANT
SUT MNCRL AB 4-0 PS2 18 (SUTURE) ×1 IMPLANT
SYR 20ML LL LF (SYRINGE) IMPLANT
TOWEL OR 17X26 10 PK STRL BLUE (TOWEL DISPOSABLE) ×1 IMPLANT
TOWEL OR NON WOVEN STRL DISP B (DISPOSABLE) ×1 IMPLANT
UNDERPAD 30X36 HEAVY ABSORB (UNDERPADS AND DIAPERS) ×2 IMPLANT
YANKAUER SUCT BULB TIP 10FT TU (MISCELLANEOUS) ×1 IMPLANT

## 2022-10-23 NOTE — Transfer of Care (Signed)
Immediate Anesthesia Transfer of Care Note  Patient: Melanie Richardson  Procedure(s) Performed: AMPUTATION TOE PARTIAL 5TH TOE AMPUTATION, LEFT FOOT (Left: Toe)  Patient Location: PACU  Anesthesia Type:MAC  Level of Consciousness: confused, lethargic, and responds to stimulation  Airway & Oxygen Therapy: Patient Spontanous Breathing  Post-op Assessment: Report given to RN, Post -op Vital signs reviewed and stable, and Patient moving all extremities  Post vital signs: Reviewed and stable  Last Vitals:  Vitals Value Taken Time  BP 124/100 10/26/2022 1622  Temp 37.7 C 10/08/2022 1622  Pulse 87 10/22/2022 1627  Resp 20 10/25/2022 1627  SpO2 100 % 10/18/2022 1627  Vitals shown include unvalidated device data.  Last Pain:  Vitals:   11/05/2022 1415  TempSrc: Axillary  PainSc:          Complications: No notable events documented.

## 2022-10-23 NOTE — Progress Notes (Signed)
Pharmacy Antibiotic Note  Melanie Richardson is a 72 y.o. female admitted on 10/08/2022 with cellulitis and ulceration on the left fifth toe.  CT concerning for osteo, podiatry recommends amputation.  Pharmacy has been consulted for vancomycin dosing.  Plan: Continue ceftriaxone per MD Continue vancomycin 1g IV q24h (eAUC 503.6, SCr rounded 0.8) Measure Vanc levels as needed.  Goal AUC = 400 - 550 Follow up renal function, culture results, and clinical course.   Height: 5\' 2"  (157.5 cm) Weight: 59 kg (130 lb) IBW/kg (Calculated) : 50.1  Temp (24hrs), Avg:99 F (37.2 C), Min:98.8 F (37.1 C), Max:99.1 F (37.3 C)  Recent Labs  Lab 10/14/2022 1233 10/20/22 0507 10/21/22 0554 10/22/22 0642 10/18/2022 0632  WBC 12.5* 8.3  --  9.0 6.9  CREATININE 0.66 0.58 0.54 0.58 0.51  LATICACIDVEN 1.5  --   --   --   --      Estimated Creatinine Clearance: 51 mL/min (by C-G formula based on SCr of 0.51 mg/dL).    Allergies  Allergen Reactions   Ciprofloxacin Other (See Comments)    Dizziness and headache   Codeine Other (See Comments)    GI upset   Erythromycin Other (See Comments)    Unknown    Antimicrobials this admission: 1/14 Rocephin x 1, resumed 1/15 >>  1/14 Cefepime >> 1/15 1/14 Vancomycin >>  Microbiology results: 1/14 BCx: ngtd 1/14 UCx: NGF    Thank you for allowing pharmacy to be a part of this patient's care.  Gretta Arab PharmD, BCPS [facility] main pharmacy [phone] 10/24/2022 7:56 AM

## 2022-10-23 NOTE — Interval H&P Note (Signed)
History and Physical Interval Note:  10/31/2022 2:08 PM  Melanie Richardson  has presented today for surgery, with the diagnosis of osteomylitis.  The various methods of treatment have been discussed with the patient and family. After consideration of risks, benefits and other options for treatment, the patient has consented to  Procedure(s): AMPUTATION TOE PARTIAL 5TH TOE AMPUTATION, LEFT FOOT (Left) as a surgical intervention.  The patient's history has been reviewed, patient examined, no change in status, stable for surgery.  I have reviewed the patient's chart and labs.  Questions were answered to the patient's satisfaction.     Edrick Kins

## 2022-10-23 NOTE — Progress Notes (Signed)
SLP Cancellation Note  Patient Details Name: Melanie Richardson MRN: [id number] DOB: [birth year]   Cancelled treatment:       Reason Eval/Treat Not Completed: Other (comment);Medical issues which prohibited therapy (pt is npo for surgery per RN, will continue efforts)  Kathleen Lime, MS [facility] SLP Acute Rehab Services Office [phone] Pager [phone]  Macario Golds 10/25/2022, 10:13 AM

## 2022-10-23 NOTE — Op Note (Signed)
OPERATIVE REPORT Patient name: Melanie Richardson MRN: [id number] DOB: 1951/05/15  DOS: 10/31/2022  Preop Dx: Osteomyelitis fifth metatarsal left foot Postop Dx: same  Procedure:  1.  Partial fifth ray amputation left foot  Surgeon: Edrick Kins DPM  Anesthesia: 50-50 mixture of 2% lidocaine plain with 0.5% Marcaine plain totaling 12 mL infiltrated in the patient's left foot  Hemostasis: Calf tourniquet inflated to a pressure of 285mmHg without esmarch exsanguination   EBL: Minimal mL Materials: None Injectables: None Pathology: Fifth metatarsal left foot sent for bone culture  Condition: The patient tolerated the procedure and anesthesia well. No complications noted or reported   Justification for procedure: The patient is a 72 y.o. female who presents today for surgical management of chronic osteomyelitis to the fifth metatarsal of the left foot.  Patient's son agreed to consent verbally. Conservative modalities of been unsuccessful in providing any sort of satisfactory alleviation of symptoms with the patient. No guarantees were expressed or implied. The patient's son (verbal consent) and the surgeon both signed the patient consent form with the witness present and placed in the patient's chart.   Procedure in Detail: The patient was brought to the operating room, remained in the hospital bed in the supine position at which time an aseptic scrub and drape were performed about the patient's respective lower extremity after anesthesia was induced as described above. Attention was then directed to the surgical area where procedure number one commenced.  Procedure #1: Partial fifth ray amputation left A long racquet type incision was planned and made around the fifth MTP of the left foot.  Incision carried directly down to the level of bone using #15 scalpel.  The fifth digit was disarticulated at the level of the MTP and removed for discard.  The distal portion of the fifth metatarsal  was very necrotic with visual affirmation of chronic osteomyelitis.  A sagittal blade mounted sagittal saw was utilized to create an osteotomy at the proximal third of the fifth metatarsal.  This was the area of healthier visual bone.  The distal two thirds of the fifth metatarsal was removed in toto for discard.  A small sliver of bone was harvested from the most proximal portion of the fifth metatarsal osteotomy and sent for pathology for bone culture to inspect for clean margins.  Prior to the harvesting of the bone culture, copious irrigation of the amputation site was performed and clean sagittal saw blade was utilized.  Primary closure was then achieved using stainless steel skin staples.  Dry sterile compressive dressings were then applied to all previously mentioned incision sites about the patient's lower extremity. The tourniquet which was used for hemostasis was deflated. All normal neurovascular responses including pink color and warmth returned all the digits of patient's lower extremity.  The patient was then transferred from the operating room to the recovery room having tolerated the procedure and anesthesia well. All vital signs are stable. After a brief stay in the recovery room the patient was readmitted to inpatient room with postoperative orders placed.    IMPRESSION: Overall clean appearing surgical site.  Good primary closure.  Good potential for healing  Edrick Kins, DPM [facility]  Dr. Edrick Kins, DPM    [address]  [place], [place] [address]                Office [phone]  Fax [phone]

## 2022-10-23 NOTE — Progress Notes (Signed)
Patient's son called to check on patient, returned call at this time, line stated that cannot be completed as dialed x3.

## 2022-10-23 NOTE — Anesthesia Postprocedure Evaluation (Signed)
Anesthesia Post Note  Patient: Melanie Richardson  Procedure(s) Performed: AMPUTATION TOE PARTIAL 5TH TOE AMPUTATION, LEFT FOOT (Left: Toe)     Patient location during evaluation: PACU Anesthesia Type: MAC Level of consciousness: awake and alert Pain management: pain level controlled Vital Signs Assessment: post-procedure vital signs reviewed and stable Respiratory status: spontaneous breathing, nonlabored ventilation, respiratory function stable and patient connected to nasal cannula oxygen Cardiovascular status: stable and blood pressure returned to baseline Postop Assessment: no apparent nausea or vomiting Anesthetic complications: no   No notable events documented.  Last Vitals:  Vitals:   10/20/2022 1700 10/10/2022 1724  BP:  (!) 140/85  Pulse: 100 (!) 102  Resp: (!) 22 20  Temp: 37.7 C 36.4 C  SpO2: 93% 93%    Last Pain:  Vitals:   10/10/2022 1724  TempSrc: Oral  PainSc:                  Santa Lighter

## 2022-10-23 NOTE — Brief Op Note (Signed)
10/30/2022  4:10 PM  PATIENT:  Melanie Richardson  72 y.o. female  PRE-OPERATIVE DIAGNOSIS:  osteomylitis  POST-OPERATIVE DIAGNOSIS:  osteomylitis  PROCEDURE:  Procedure(s): AMPUTATION TOE PARTIAL 5TH TOE AMPUTATION, LEFT FOOT (Left)  SURGEON:  Surgeon(s) and Role:    Edrick Kins, DPM - Primary  PHYSICIAN ASSISTANT:   ASSISTANTS: none   ANESTHESIA:   local and MAC  EBL:  minimal   BLOOD ADMINISTERED:none  DRAINS: none   LOCAL MEDICATIONS USED:  MARCAINE   , LIDOCAINE , and Amount: 12 ml  SPECIMEN:  Source of Specimen:  fifth metatarsal left  DISPOSITION OF SPECIMEN:  PATHOLOGY  COUNTS:  YES  TOURNIQUET:   Total Tourniquet Time Documented: Calf (Left) - 14 minutes Total: Calf (Left) - 14 minutes   DICTATION: .Viviann Spare Dictation  PLAN OF CARE: Admit to inpatient   PATIENT DISPOSITION:  PACU - hemodynamically stable.   Delay start of Pharmacological VTE agent (>24hrs) due to surgical blood loss or risk of bleeding: not applicable

## 2022-10-23 NOTE — Progress Notes (Addendum)
PROGRESS NOTE    Melanie Richardson  NUU:[id number] DOB: October 20, 1950 DOA: 10/28/2022 PCP: Patient, No Pcp Per    Brief Narrative:  Patient is a 72 years old female with history of some cognitive impairment, multiple sclerosis, hypertension, hypothyroidism, wheelchair-bound status, presented to hospital from assisted living facility with confusion, disorientation, mild agitation at the facility.  There was some mention of redness, swelling and tenderness of the bilateral lower extremities with an ulceration on the left fifth toe which was new for the last 2 weeks or so.  In the ED, patient was moderately combative.  Had low-grade fever.  She was noted to have new ulceration on the foot.  Small sacral ulcer was also noted.  Vitals was notable for temperature of 99.9 F.  Laboratory data showed WBC elevated at 12.5 with hemoglobin of 11.6.  BMP was notable for mild hypokalemia at 3.2.  Lactate was 1.5.  COVID and influenza was negative.  Urinalysis was negative for any infection.  Patient received Rocephin IV in the ED, urine culture and blood culture was sent from the ED. Patient was then considered for admission to hospital for further evaluation and treatment.  After hospitalization, patient was encephalopathic, podiatry was consulted and CT scan of the foot with contrast showed evidence of osteomyelitis. Plan is surgical intervention 10/21/2022.  On broad-spectrum antibiotic with vancomycin and ceftriaxone.   Assessment and Plan:  Altered mental status, metabolic encephalopathy with underlying cognitive dysfunction  likely secondary to foot infection.  Waxing and waning level of consciousness appears to be more delirious and febrile today.  Plan for surgical intervention today.  Continue antibiotics with vancomycin and  ceftriaxone.  Blood cultures negative in 4 days.  Urine culture negative so far. Speech therapy has been consulted for oral safety but is still NPO.   Sepsis secondary  left foot   cellulitis and fifth metatarsal osteomyelitis.   ESR of 18.  CT of the foot with fifth metatarsal osteomyelitis.  Unable to do MRI of the foot due to posture and agitation.  Plan for surgical intervention today.  Cognitive dysfunction.  At baseline she is able to recognize her family members.  Mostly wheelchair-bound.  Recent decline since Christmas as per the family.  Minimally verbal today and appears more delirious.  Hypomagnesemia.  Improved after replacement.  Magnesium 1.7.   Hypokalemia.  Improved today at 3.5 after aggressive replacement.  History of multiple sclerosis with mild cognitive dysfunction currently at the assisted nursing facility on wheelchair with limited use of legs.  PT/OT recommends skilled nursing facility at this time.   Essential hypertension Continue metoprolol.  Hypothyroidism TSH elevated at 5.5.  On Synthroid at home.  Will resume when p.o. okay.  stage I with Stage 2 chronic pressure ulceration.  Present on admission.  CT scan of the abdomen pelvis without any acute findings.  No evidence of local soft tissue abscess on the sacral area.  Wound care on board.  Continue local wound care.   Debility weakness.  Patient is wheelchair-bound at the assisted living facility.  Will obtain physical therapy evaluation when appropriate.   DVT prophylaxis: enoxaparin (LOVENOX) injection 40 mg Start: 11/02/2022 2200   Code Status:     Code Status: DNR  Disposition: Assisted living facility/skilled nursing facility likely in 2 to 3 days..  Status is: Inpatient  The is inpatient because: Sepsis secondary to foot infection, metabolic encephalopathy, IV antibiotics, podiatry evaluation and plan for surgical intervention today.   Family Communication:  Spoke with  the patient's son [name] on the phone 10/22/2022 and updated him about the clinical condition of the patient.  Consultants:  Podiatry  Procedures:  None yet  Antimicrobials:  Vancomycin and  Rocephin 10/27/2022>  Subjective: Today, patient was seen and examined at bedside.  Patient was seen and examined at bedside.  Appears to be less interactive and febrile in flexed posture today.  Moans and nods her head on asking.  Objective: Vitals:   10/22/22 0407 10/22/22 1343 10/22/22 2039 10/12/2022 0605  BP: 112/64 102/71 133/77 (!) 156/83  Pulse:  (!) 110 88 87  Resp:  20 16 17   Temp: 98.7 F (37.1 C)  98.8 F (37.1 C) 99.1 F (37.3 C)  TempSrc: Axillary  Oral Oral  SpO2: 95%  100% 100%  Weight:      Height:        Intake/Output Summary (Last 24 hours) at 10/30/2022 1159 Last data filed at 10/10/2022 10/25/2022 Gross per 24 hour  Intake 2345.46 ml  Output 500 ml  Net 1845.46 ml    Filed Weights   10-27-2022 1234  Weight: 59 kg    Physical Examination: Body mass index is 23.78 kg/m.   General: Alert awake but minimally interactive, moaning, nodding her head in response.  In the flexed posture. HENT:   No scleral pallor or icterus noted. Oral mucosa is moist Chest: Diminished breath sounds bilaterally.  Clear breath sounds. CVS: S1 &S2 heard. No murmur.  Regular rate and rhythm. Abdomen: Soft, nontender, nondistended.  Bowel sounds are heard.   Extremities: Left foot with swelling erythema ulceration covered with dressing. Psych: Alert awake, minimally interactive, CNS: Alert awake, minimally interactive Skin: Sacral pressure ulceration on admission.  Left foot cellulitis with ulcer covered with dressing.  Data Reviewed:   CBC: Recent Labs  Lab October 27, 2022 1233 10/20/22 0507 10/22/22 0642 10/11/2022 0632  WBC 12.5* 8.3 9.0 6.9  NEUTROABS 10.0*  --   --   --   HGB 11.6* 10.3* 10.6* 11.3*  HCT 34.5* 30.3* 30.6* 32.7*  MCV 91.5 90.4 87.2 88.4  PLT PLATELET CLUMPS NOTED ON SMEAR, COUNT APPEARS ADEQUATE 288 296 318     Basic Metabolic Panel: Recent Labs  Lab 2022-10-27 1233 10/20/22 0507 10/21/22 0554 10/22/22 0642 10/26/2022 0632  NA 137 132* 133* 132* 132*  K 3.2*  2.5* 2.6* 2.6* 3.5  CL 101 100 101 102 107  CO2 26 22 22 23  19*  GLUCOSE 99 114* 122* 121* 114*  BUN 13 7* <5* <5* <5*  CREATININE 0.66 0.58 0.54 0.58 0.51  CALCIUM 8.6* 7.5* 7.2* 7.3* 7.6*  MG  --  1.1* 1.4* 1.6* 1.7     Liver Function Tests: Recent Labs  Lab 10/27/22 1233 10/20/22 0507 10/21/22 0554  AST 28 23 37  ALT 18 15 19   ALKPHOS 56 40 41  BILITOT 0.5 0.9 0.5  PROT 8.1 6.2* 6.3*  ALBUMIN 3.7 2.8* 2.9*      Radiology Studies: CT FOOT LEFT W CONTRAST  Result Date: 10/21/2022 CLINICAL DATA:  Osteomyelitis suspected. Foot infection, cellulitis, drainage. EXAM: CT OF THE LOWER LEFT EXTREMITY WITH CONTRAST TECHNIQUE: Multidetector CT imaging of the lower left extremity was performed according to the standard protocol following intravenous contrast administration. RADIATION DOSE REDUCTION: This exam was performed according to the departmental dose-optimization program which includes automated exposure control, adjustment of the mA and/or kV according to patient size and/or use of iterative reconstruction technique. CONTRAST:  10/14/2022 OMNIPAQUE IOHEXOL 300 MG/ML  SOLN  COMPARISON:  Plain films 11/05/2022 FINDINGS: Bones/Joint/Cartilage Bone destruction in the 5th metatarsal head concerning for osteomyelitis. No fracture, subluxation or dislocation. Ligaments Suboptimally assessed by CT. Muscles and Tendons Grossly unremarkable Soft tissues Soft tissue swelling and soft tissue defect/ulceration noted over the 5th metatarsal head. IMPRESSION: Soft tissue ulceration laterally in the foot overlying the 5th metatarsal head where there is cortical/bony destruction concerning for osteomyelitis. Electronically Signed   By: Rolm Baptise M.D.   On: 10/21/2022 21:20      LOS: 3 days    Flora Lipps, MD [facility] Available via Epic secure chat 7am-7pm After these hours, please refer to coverage provider listed on [email or web address] 10/09/2022, 11:59 AM

## 2022-10-23 NOTE — H&P (Signed)
Anesthesia H&P Update: History and Physical Exam reviewed; patient is OK for planned anesthetic and procedure. ? ?

## 2022-10-23 NOTE — Anesthesia Preprocedure Evaluation (Addendum)
Anesthesia Evaluation  Patient identified by MRN, date of birth, ID band Patient unresponsive    Reviewed: Allergy & Precautions, NPO status , Patient's Chart, lab work & pertinent test results, reviewed documented beta blocker date and time   Airway Mallampati: II  TM Distance: >3 FB Neck ROM: Full    Dental  (+) Teeth Intact, Dental Advisory Given   Pulmonary Current Smoker and Patient abstained from smoking.,    Pulmonary exam normal breath sounds clear to auscultation       Cardiovascular hypertension, Pt. on home beta blockers Normal cardiovascular exam Rhythm:Regular Rate:Normal     Neuro/Psych PSYCHIATRIC DISORDERS Anxiety MS  Neuromuscular disease    GI/Hepatic negative GI ROS, Neg liver ROS,   Endo/Other  negative endocrine ROS  Renal/GU negative Renal ROS     Musculoskeletal negative musculoskeletal ROS (+) Osteomyelitis, left foot    Abdominal   Peds  Hematology  (+) Blood dyscrasia, anemia ,   Anesthesia Other Findings Day of surgery medications reviewed with the patient.  Reproductive/Obstetrics                            Anesthesia Physical Anesthesia Plan  ASA: 4  Anesthesia Plan: MAC   Post-op Pain Management: Ofirmev IV (intra-op)*   Induction: Intravenous  PONV Risk Score and Plan: 1 and TIVA, Dexamethasone and Ondansetron  Airway Management Planned: Simple Face Mask and Natural Airway  Additional Equipment:   Intra-op Plan:   Post-operative Plan:   Informed Consent: I have reviewed the patients History and Physical, chart, labs and discussed the procedure including the risks, benefits and alternatives for the proposed anesthesia with the patient or authorized representative who has indicated his/her understanding and acceptance.   Patient has DNR.  Discussed DNR with power of attorney and Continue DNR.   Dental advisory given and Consent reviewed with  POA  Plan Discussed with: CRNA  Anesthesia Plan Comments: (Phone consent with Leana Gamer, patient's son.)        Anesthesia Quick Evaluation

## 2022-10-24 ENCOUNTER — Inpatient Hospital Stay (HOSPITAL_COMMUNITY): Payer: Medicare Other

## 2022-10-24 ENCOUNTER — Encounter (HOSPITAL_COMMUNITY): Payer: Self-pay | Admitting: Podiatry

## 2022-10-24 DIAGNOSIS — Z7189 Other specified counseling: Secondary | ICD-10-CM

## 2022-10-24 DIAGNOSIS — G9341 Metabolic encephalopathy: Secondary | ICD-10-CM | POA: Diagnosis not present

## 2022-10-24 DIAGNOSIS — Z515 Encounter for palliative care: Secondary | ICD-10-CM | POA: Diagnosis not present

## 2022-10-24 DIAGNOSIS — Z66 Do not resuscitate: Secondary | ICD-10-CM

## 2022-10-24 DIAGNOSIS — E440 Moderate protein-calorie malnutrition: Secondary | ICD-10-CM | POA: Insufficient documentation

## 2022-10-24 DIAGNOSIS — L03032 Cellulitis of left toe: Secondary | ICD-10-CM | POA: Diagnosis not present

## 2022-10-24 DIAGNOSIS — R627 Adult failure to thrive: Secondary | ICD-10-CM

## 2022-10-24 LAB — BASIC METABOLIC PANEL
Anion gap: 9 (ref 5–15)
BUN: <5 mg/dL — ABNORMAL LOW (ref 8–23)
CO2: 21 mmol/L — ABNORMAL LOW (ref 22–32)
Calcium: 7.9 mg/dL — ABNORMAL LOW (ref 8.9–10.3)
Chloride: 105 mmol/L (ref 98–111)
Creatinine, Ser: 0.5 mg/dL (ref 0.44–1.00)
GFR, Estimated: >60 mL/min (ref >60)
Glucose, Bld: 111 mg/dL — ABNORMAL HIGH (ref 70–99)
Potassium: 3.2 mmol/L — ABNORMAL LOW (ref 3.5–5.1)
Sodium: 135 mmol/L (ref 135–145)

## 2022-10-24 LAB — CBC
HCT: 32.5 % — ABNORMAL LOW (ref 36.0–46.0)
Hemoglobin: 10.9 g/dL — ABNORMAL LOW (ref 12.0–15.0)
MCH: 29.9 pg (ref 26.0–34.0)
MCHC: 33.5 g/dL (ref 30.0–36.0)
MCV: 89 fL (ref 80.0–100.0)
Platelets: 345 10*3/uL (ref 150–400)
RBC: 3.65 MIL/uL — ABNORMAL LOW (ref 3.87–5.11)
RDW: 12.8 % (ref 11.5–15.5)
WBC: 7.8 10*3/uL (ref 4.0–10.5)
nRBC: 0 % (ref 0.0–0.2)

## 2022-10-24 LAB — GLUCOSE, CAPILLARY
Glucose-Capillary: 101 mg/dL — ABNORMAL HIGH (ref 70–99)
Glucose-Capillary: 114 mg/dL — ABNORMAL HIGH (ref 70–99)

## 2022-10-24 LAB — CULTURE, BLOOD (ROUTINE X 2): Culture: NO GROWTH

## 2022-10-24 LAB — MAGNESIUM: Magnesium: 1.7 mg/dL (ref 1.7–2.4)

## 2022-10-24 MED ORDER — PANCRELIPASE (LIP-PROT-AMYL) 10440-39150 UNITS PO TABS
20880.0000 [IU] | ORAL_TABLET | Freq: Once | ORAL | Status: AC
  Administered 2022-10-24: 20880 [IU]
  Filled 2022-10-24: qty 2

## 2022-10-24 MED ORDER — PROSOURCE TF20 ENFIT COMPATIBL EN LIQD
60.0000 mL | Freq: Every day | ENTERAL | Status: DC
  Administered 2022-10-25 – 2022-10-27 (×3): 60 mL
  Filled 2022-10-24 (×5): qty 60

## 2022-10-24 MED ORDER — OSMOLITE 1.5 CAL PO LIQD
1000.0000 mL | ORAL | Status: DC
  Administered 2022-10-25 – 2022-10-27 (×5): 1000 mL
  Filled 2022-10-24 (×5): qty 1000

## 2022-10-24 MED ORDER — POTASSIUM CHLORIDE 10 MEQ/100ML IV SOLN
10.0000 meq | INTRAVENOUS | Status: DC
  Administered 2022-10-24: 10 meq via INTRAVENOUS
  Filled 2022-10-24 (×2): qty 100

## 2022-10-24 MED ORDER — SODIUM BICARBONATE 650 MG PO TABS
650.0000 mg | ORAL_TABLET | Freq: Once | ORAL | Status: AC
  Administered 2022-10-24: 650 mg
  Filled 2022-10-24: qty 1

## 2022-10-24 NOTE — Progress Notes (Signed)
Initial Nutrition Assessment  DOCUMENTATION CODES:   Non-severe (moderate) malnutrition in context of chronic illness  INTERVENTION:   Monitor magnesium, potassium, and phosphorus BID for at least 3 days, MD to replete as needed, as pt is at risk for refeeding syndrome.  Once tube placed and placement verified: -Initiate Osmolite 1.5 @ 20 ml/hr, advance by 10 ml every 12 hours to goal rate of 40 ml/hr. -60 ml Prosource TF 20 daily -Provides at goal: 1520 kcals, 80g protein and 731 ml H2O -Free water recommendation: 200 ml every 6 hours (800 ml)  NUTRITION DIAGNOSIS:   Moderate Malnutrition related to chronic illness (MS, Bell's Palsy) as evidenced by moderate fat depletion, severe muscle depletion.  GOAL:   Patient will meet greater than or equal to 90% of their needs  MONITOR:   Labs, Weight trends, I & O's  REASON FOR ASSESSMENT:   Consult Enteral/tube feeding initiation and management  ASSESSMENT:   72 years old female with history of some cognitive impairment, multiple sclerosis, hypertension, hypothyroidism, wheelchair-bound status, presented to hospital from assisted living facility with confusion, disorientation, mild agitation at the facility.  There was some mention of redness, swelling and tenderness of the bilateral lower extremities with an ulceration on the left fifth toe which was new for the last 2 weeks or so. Admitted for AMS, likely acute metabolic encephalopathy.  1/18: s/p Partial fifth ray amputation left foot   Patient in room, no family at bedside. Pt disoriented. Pt unable to provide any history. Curled up in bed and difficult to examine given contractures. Pt was evaluated by SLP today, pt to remain NPO given dysphagia r/t MS and chronic basal ganglia CVA. PEG not the best option given cognitive deficits.  MD consulted RD as NGT being placed until [place] can be decided per Palliative care.  Will provide tube feeding recommendations above. Per MD, pt's  son has agreed to NGT placement and feeds. Will place orders to start later today.  Pt will be at refeeding syndrome risk, per chart review pt has not been eating well for at least 4 days, likely longer given mental status and chronic conditions.   No recent weight changes noted per review of weight records.   Medications: KCl  Labs reviewed: Low K   NUTRITION - FOCUSED PHYSICAL EXAM:  Flowsheet Row Most Recent Value  Orbital Region Mild depletion  Upper Arm Region Moderate depletion  Thoracic and Lumbar Region Unable to assess  [pt not wanting to move positions]  Buccal Region Moderate depletion  Temple Region Moderate depletion  Clavicle Bone Region Severe depletion  Clavicle and Acromion Bone Region Severe depletion  Scapular Bone Region Severe depletion  Dorsal Hand Moderate depletion  [some swelling]  Patellar Region Unable to assess  Anterior Thigh Region Unable to assess  Posterior Calf Region Unable to assess  Edema (RD Assessment) Mild  Hair Reviewed  [in braid, looked thin on top]  Eyes Unable to assess  [kept eyes closed]  Mouth Unable to assess  [would not open mouth]  Skin Reviewed  Nails Reviewed       Diet Order:   Diet Order             Diet NPO time specified  Diet effective now                   EDUCATION NEEDS:   Not appropriate for education at this time  Skin:  Skin Assessment: Skin Integrity Issues: Skin Integrity Issues:: Stage II,  Incisions Stage II: mid coccyx Incisions: 1/18 left foot  Last BM:  1/16  Height:   Ht Readings from Last 1 Encounters:  11/03/2022 5\' 2"  (1.575 m)    Weight:   Wt Readings from Last 1 Encounters:  10/14/2022 59 kg    BMI:  Body mass index is 23.78 kg/m.  Estimated Nutritional Needs:   Kcal:  1450-1650  Protein:  75-90g  Fluid:  1.6L/day  Clayton Bibles, MS, RD, LDN Inpatient Clinical Dietitian Contact information available via Amion

## 2022-10-24 NOTE — Progress Notes (Signed)
Nasogastric small bore feeding tube placement X-ray verified. Stylet removed, NGT is ready for use.

## 2022-10-24 NOTE — Progress Notes (Signed)
This RN placed small bore feeding tube. Patient showed no signs of respiratory compromise throughout placement. Patients Sp02 remained 98% while on room air. No color change when checking for the presents of Co2 after tube placement.   KUB x-ray placed to verify placement, will remove stylet and clear for use once x-ray read.    Reach out to ICU charge/ Rapid response with any concerns. 

## 2022-10-24 NOTE — Consult Note (Signed)
Consultation Note Date: 10/24/2022   Patient Name: Melanie Richardson  DOB: 06/26/1951  MRN: [id number]  Age / Sex: 72 y.o., female  PCP: Patient, No Pcp Per Referring Physician: Geradine Girt, DO  Reason for Consultation: Establishing goals of care  HPI/Patient Profile: 72 y.o. female  with past medical history of cognitive impairment, multiple sclerosis, hypertension, hypothyroidism, and wheelchair-bound status admitted on 11/04/2022 with AMS. AMS felt to be r/t L foot cellulitis and fifth metatarsal osteomyelitis. Patient underwent surgical procedure 1/18. Patient has not eaten in 4 days, SLP unable to evaluate r/t mental status. PMT consulted to discuss [place].    Clinical Assessment and Goals of Care: I have reviewed medical records including EPIC notes, labs and imaging, received report from RN, assessed the patient and then spoke with patient's son [name]  to discuss diagnosis prognosis, [name], EOL wishes, disposition and options.  [name] tells me he primarily makes medical decisions for patient. He tells me his brother Lennette Bihari is involved as well and he will provide updates to him.   I introduced Palliative Medicine as specialized medical care for people living with serious illness. It focuses on providing relief from the symptoms and stress of a serious illness. The goal is to improve quality of life for both the patient and the family.  We discussed a brief life review of the patient. Patient is divorced; has 2 sons. Patient has been living at facility for 3 years.  As far as functional and nutritional status, son reports several years of decline. Multiple falls that led to facility placement in 2020. Poor appetite at baseline. Has not walked since 2020. Son reports ongoing cognitive decline - ?hallucinations. At baseline, patient remains able to converse, talks about the past a lot and not so much recent events. She is able to recognize family members.  Son tells me patient has had a more significant decline over the past year, has become more isolated.    We discussed patient's current illness and what it means in the larger context of patient's on-going co-morbidities.  Natural disease trajectory and expectations at EOL were discussed. We discussed her infection and surgical procedure. We discussed her persistent AMS leading to no PO intake.   I attempted to elicit values and goals of care important to the patient. Son tells me he and patient have not discussed medical decisions specifically but he knows her very well and knows that she would not to be prolonged in her current condition.    The difference between aggressive medical intervention and comfort care was considered in light of the patient's goals of care.   We discussed artificial feeding. Son is open to short term trial of this via cortrak to allow a few days of time for improvement since this is such a drastic change from her baseline. We discuss potential that patient will not allow placement or will remove tube. He understands. We discussed allowing weekend to see if mental status improves. We discussed that with no improvement or any worsening a transition to comfort focused measures may make the most sense. Son is certain patient would not want PEG.   Discussed with son the importance of continued conversation with family and the medical providers regarding overall plan of care and treatment options, ensuring decisions are within the context of the patient's values and GOCs.    Questions and concerns were addressed. The family was encouraged to call with questions or concerns.   Primary Decision Maker NEXT OF KIN -  2 sons    SUMMARY OF RECOMMENDATIONS   - NO PEG - short term trial of cortrak - continue over weekend and f/u Monday to evaluate condition - if worsening or no improvement likely transition to focus on comfort - PMT to f/u Mon  Code Status/Advance Care  Planning: DNR     Primary Diagnoses: Present on Admission:  Acute metabolic encephalopathy  Hypertension  Hypothyroidism  MS (multiple sclerosis) (HCC)  Sacral decubitus ulcer   I have reviewed the medical record, interviewed the patient and family, and examined the patient. The following aspects are pertinent.  Past Medical History:  Diagnosis Date   Hypertension    MS (multiple sclerosis) (HCC)    Osteopenia 05/2014   T score -1.8 FRAX 16%/4.7%   Thyroid disease    Hypothyroid   Trigeminal neuralgia    Social History   Socioeconomic History   Marital status: Married    Spouse name: Not on file   Number of children: 2   Years of education: college   Highest education level: Not on file  Occupational History    Employer: OTHER  Tobacco Use   Smoking status: Every Day    Packs/day: 1.00    Types: Cigarettes   Smokeless tobacco: Never  Substance and Sexual Activity   Alcohol use: Yes    Alcohol/week: 0.0 standard drinks of alcohol    Comment: rarely   Drug use: No   Sexual activity: Yes    Birth control/protection: Post-menopausal    Comment: 1st intercourse 72 yo-Fewer than 5 partners  Other Topics Concern   Not on file  Social History Narrative   Patient lives in [facility]   Patient is right handed   Caffeine consumption is 3 cups a day   Social Determinants of Health   Financial Resource Strain: Not on file  Food Insecurity: Not on file  Transportation Needs: Not on file  Physical Activity: Not on file  Stress: Not on file  Social Connections: Not on file   Family History  Problem Relation Age of Onset   Heart disease Mother    Hypertension Father    Cancer Father        colon   Stroke Father    Breast cancer Maternal Aunt 60   Breast cancer Cousin 40   Scheduled Meds:  carbamazepine  200 mg Oral TID   cycloSPORINE  1 drop Both Eyes BID   enoxaparin (LOVENOX) injection  40 mg Subcutaneous Q24H   feeding  supplement (OSMOLITE 1.5 CAL)  1,000 mL Per Tube Q24H   feeding supplement (PROSource TF20)  60 mL Per Tube Daily   leptospermum manuka honey  1 Application Topical Daily   levothyroxine  75 mcg Oral Q0600   metoprolol succinate  25 mg Oral Daily   Continuous Infusions:  cefTRIAXone (ROCEPHIN)  IV 2 g (11/05/2022 1740)   vancomycin 1,000 mg (10/30/2022 1806)   PRN Meds:.acetaminophen **OR** acetaminophen, albuterol, fluticasone, haloperidol lactate, haloperidol lactate, hydrALAZINE, metoprolol tartrate, ondansetron **OR** ondansetron (ZOFRAN) IV Allergies  Allergen Reactions   Ciprofloxacin Other (See Comments)    Dizziness and headache   Codeine Other (See Comments)    GI upset   Erythromycin Other (See Comments)    Unknown   Review of Systems  Unable to perform ROS: Mental status change    Physical Exam Constitutional:      General: She is not in acute distress.    Appearance: She is ill-appearing.  Comments: Eyes open but staring at bed, does not respond to me at all, does not follow commands  Pulmonary:     Effort: Pulmonary effort is normal.  Skin:    General: Skin is warm and dry.     Vital Signs: BP (!) 174/111 (BP Location: Right Arm)   Pulse (!) 109   Temp 98.1 F (36.7 C) (Oral)   Resp 20   Ht 5\' 2"  (1.575 m)   Wt 59 kg   LMP 10/06/1996   SpO2 96%   BMI 23.78 kg/m  Pain Scale: Faces   Pain Score: 0-No pain   SpO2: SpO2: 96 % O2 Device:SpO2: 96 % O2 Flow Rate: .O2 Flow Rate (L/min): 4 L/min  IO: Intake/output summary:  Intake/Output Summary (Last 24 hours) at 10/24/2022 1434 Last data filed at 10/24/2022 0700 Gross per 24 hour  Intake 400 ml  Output 455 ml  Net -55 ml    LBM: Last BM Date : 10/21/22 Baseline Weight: Weight: 59 kg Most recent weight: Weight: 59 kg     Palliative Assessment/Data: PPS 10% d/t no PO intake     *Please note that this is a verbal dictation therefore any spelling or grammatical errors are due to the "[address]" system interpretation.  Juel Burrow, DNP, AGNP-C Palliative Medicine Team [phone] Pager: [phone]

## 2022-10-24 NOTE — Progress Notes (Signed)
Per patients nurse Beckie Busing, LPN) at [facility] patients baseline patient was ambulatory and able to assist with ADLs daily.  Patient was on a regular diet and able to feed herself with not difficulties swallowing.

## 2022-10-24 NOTE — Evaluation (Addendum)
SLP Cancellation Note  Patient Details Name: Melanie Richardson MRN: [id number] DOB: 01/10/1951   Cancelled treatment:       Reason Eval/Treat Not Completed: Other (comment) (Upon arrival to room, pt had secretion retention on left labial region - she is turned to her left side without awareness potentially  Pt continues to refuse po offerings including gingerale, water and honey bun, She seals her lips tightly and covers her face with her hands.  SLP cleaned applesauce from gown RN gave to her for po medications. SLP orally suctioned pt as best able but she is resistant.    Recommend consider changing medications to alternative form if able based on his morning's performance.  Given this is day 4 of her refusal to take po, poor awareness of oral secretions retained - it may be beneficial to address goals of care.  Per review of soft chart -pt has h/o anxiety, Bell's Palsy, and cognitive communication disorder.    Given her advanced age and neuro condition/cog deficits, PEG would likely carry heavier level of burden than benefit.  Suspect her po was poor prior to admission given she appears cachectic.  Suspect some baseline dysphagia d/t MS and chronic basal ganglia CVA, for which she managed with current severe exacerbation from acute illness.    Melanie Lime, MS [facility] SLP Acute Rehab Services Office [phone] Pager [phone]  Melanie Richardson 10/24/2022, 9:59 AM

## 2022-10-24 NOTE — Progress Notes (Signed)
Called due to pt feeding tube unable to be flushed, UNCLOGGING FEEDING TUBE PROTOCOL WITH PANCREATIC ENZYMES initiated. [facility], NP aware will replace if unsuccessful per [facility], NP.

## 2022-10-24 NOTE — Progress Notes (Signed)
PROGRESS NOTE    Melanie Richardson  GYI:[id number] DOB: Feb 23, 1951 DOA: 11-05-22 PCP: Patient, No Pcp Per    Brief Narrative:  Patient is a 72 years old female with history of some cognitive impairment, multiple sclerosis, hypertension, hypothyroidism, wheelchair-bound status, presented to hospital from assisted living facility with confusion, disorientation, mild agitation at the facility.  There was some mention of redness, swelling and tenderness of the bilateral lower extremities with an ulceration on the left fifth toe which was new for the last 2 weeks or so.  In the ED, patient was moderately combative.  Had low-grade fever.  She was noted to have new ulceration on the foot.  Small sacral ulcer was also noted.  Vitals was notable for temperature of 99.9 F.  Laboratory data showed WBC elevated at 12.5 with hemoglobin of 11.6.  BMP was notable for mild hypokalemia at 3.2.  Lactate was 1.5.  COVID and influenza was negative.  Urinalysis was negative for any infection.  Patient received Rocephin IV in the ED, urine culture and blood culture was sent from the ED. Patient was then considered for admission to hospital for further evaluation and treatment.  After hospitalization, patient was encephalopathic, podiatry was consulted and CT scan of the foot with contrast showed evidence of osteomyelitis. Plan is surgical intervention 10/18/2022.  On broad-spectrum antibiotic with vancomycin and ceftriaxone.  Has not eaten in 4 days.  Continues to have AMS.     Assessment and Plan:  Altered mental status, likely metabolic encephalopathy with underlying cognitive dysfunction  likely secondary to foot infection.  Waxing and waning level of consciousness appears to be more delirious and febrile today.   -s/p surgical intervention - Continue antibiotics with vancomycin and  ceftriaxone.   -Blood cultures negative  - Urine culture negative so far.  -unable to participate with SLP-- will place NG  tube/cortrack for meds/feeds until family can meet with palliative care   Sepsis secondary  left foot  cellulitis and fifth metatarsal osteomyelitis.   ESR of 18.  CT of the foot with fifth metatarsal osteomyelitis.  Unable to do MRI of the foot due to posture and agitation.  -s/p surgical intervention  Cognitive dysfunction.  At baseline she is able to recognize her family members.  Mostly wheelchair-bound.  Recent decline since Christmas as per the family.  Minimally verbal today and appears more delirious. -palliative care consult  Hypomagnesemia.   -replte   Hypokalemia.  -replete  History of multiple sclerosis with mild cognitive dysfunction currently at the assisted nursing facility on wheelchair with limited use of legs.  PT/OT recommends skilled nursing facility at this time.   Essential hypertension Continue metoprolol.  Hypothyroidism TSH elevated at 5.5.  On Synthroid at home  stage I with Stage 2 chronic pressure ulceration.  Present on admission.  CT scan of the abdomen pelvis without any acute findings.  No evidence of local soft tissue abscess on the sacral area.  Wound care on board.  Continue local wound care.   Debility weakness.  Patient is wheelchair-bound at the assisted living facility vs ambulatory -- son and LPNs report different baselines   DVT prophylaxis: enoxaparin (LOVENOX) injection 40 mg Start: 2022/11/05 2200   Code Status:     Code Status: DNR  Disposition: needs palliative care consult  Status is: Inpatient  The is inpatient because: Sepsis secondary to foot infection, metabolic encephalopathy, IV antibiotics, podiatry evaluation and plan for surgical intervention today.   Family Communication:  -spoke with son (  andy)  Consultants:  Podiatry Palliative care    Subjective: Not able to eat, not interactive  Objective: Vitals:   10/31/2022 1700 10/13/2022 1724 11/02/2022 2246 10/24/22 0701  BP:  (!) 140/85 127/72 (!) 144/91  Pulse: 100  (!) 102 93 96  Resp: (!) 22 20 20 18   Temp: 99.8 F (37.7 C) 97.6 F (36.4 C) 99.2 F (37.3 C) 99.5 F (37.5 C)  TempSrc:  Oral Oral   SpO2: 93% 93% 94% 97%  Weight:      Height:        Intake/Output Summary (Last 24 hours) at 10/24/2022 1050 Last data filed at 10/24/2022 0700 Gross per 24 hour  Intake 400 ml  Output 455 ml  Net -55 ml   Filed Weights   10/22/2022 1234  Weight: 59 kg    Physical Examination: Body mass index is 23.78 kg/m.    General: Appearance:    Frail appearing female who is not interactive     Lungs:    respirations unlabored  Heart:    Normal heart rate.      Neurologic:   Curled on side, moans only- covers eyes      Data Reviewed:   CBC: Recent Labs  Lab 10/08/2022 1233 10/20/22 0507 10/22/22 0642 10/24/2022 0632 10/24/22 0541  WBC 12.5* 8.3 9.0 6.9 7.8  NEUTROABS 10.0*  --   --   --   --   HGB 11.6* 10.3* 10.6* 11.3* 10.9*  HCT 34.5* 30.3* 30.6* 32.7* 32.5*  MCV 91.5 90.4 87.2 88.4 89.0  PLT PLATELET CLUMPS NOTED ON SMEAR, COUNT APPEARS ADEQUATE 288 296 318 888    Basic Metabolic Panel: Recent Labs  Lab 10/20/22 0507 10/21/22 0554 10/22/22 0642 10/25/2022 0632 10/24/22 0541  NA 132* 133* 132* 132* 135  K 2.5* 2.6* 2.6* 3.5 3.2*  CL 100 101 102 107 105  CO2 22 22 23  19* 21*  GLUCOSE 114* 122* 121* 114* 111*  BUN 7* <5* <5* <5* <5*  CREATININE 0.58 0.54 0.58 0.51 0.50  CALCIUM 7.5* 7.2* 7.3* 7.6* 7.9*  MG 1.1* 1.4* 1.6* 1.7 1.7    Liver Function Tests: Recent Labs  Lab 10/08/2022 1233 10/20/22 0507 10/21/22 0554  AST 28 23 37  ALT 18 15 19   ALKPHOS 56 40 41  BILITOT 0.5 0.9 0.5  PROT 8.1 6.2* 6.3*  ALBUMIN 3.7 2.8* 2.9*     Radiology Studies: No results found.    LOS: 4 days    Geradine Girt, DO [facility] Available via Epic secure chat 7am-7pm After these hours, please refer to coverage provider listed on [email or web address] 10/24/2022, 10:50 AM

## 2022-10-25 ENCOUNTER — Inpatient Hospital Stay (HOSPITAL_COMMUNITY): Payer: Medicare Other

## 2022-10-25 DIAGNOSIS — G9341 Metabolic encephalopathy: Secondary | ICD-10-CM | POA: Diagnosis not present

## 2022-10-25 DIAGNOSIS — E039 Hypothyroidism, unspecified: Secondary | ICD-10-CM | POA: Diagnosis not present

## 2022-10-25 DIAGNOSIS — L03032 Cellulitis of left toe: Secondary | ICD-10-CM | POA: Diagnosis not present

## 2022-10-25 DIAGNOSIS — E876 Hypokalemia: Secondary | ICD-10-CM

## 2022-10-25 LAB — BASIC METABOLIC PANEL
Anion gap: 10 (ref 5–15)
BUN: 5 mg/dL — ABNORMAL LOW (ref 8–23)
CO2: 21 mmol/L — ABNORMAL LOW (ref 22–32)
Calcium: 8.1 mg/dL — ABNORMAL LOW (ref 8.9–10.3)
Chloride: 104 mmol/L (ref 98–111)
Creatinine, Ser: 0.42 mg/dL — ABNORMAL LOW (ref 0.44–1.00)
GFR, Estimated: >60 mL/min (ref >60)
Glucose, Bld: 106 mg/dL — ABNORMAL HIGH (ref 70–99)
Potassium: 2.9 mmol/L — ABNORMAL LOW (ref 3.5–5.1)
Sodium: 135 mmol/L (ref 135–145)

## 2022-10-25 LAB — MAGNESIUM: Magnesium: 1.5 mg/dL — ABNORMAL LOW (ref 1.7–2.4)

## 2022-10-25 LAB — PHOSPHORUS: Phosphorus: 2.6 mg/dL (ref 2.5–4.6)

## 2022-10-25 LAB — GLUCOSE, CAPILLARY
Glucose-Capillary: 101 mg/dL — ABNORMAL HIGH (ref 70–99)
Glucose-Capillary: 115 mg/dL — ABNORMAL HIGH (ref 70–99)
Glucose-Capillary: 126 mg/dL — ABNORMAL HIGH (ref 70–99)
Glucose-Capillary: 127 mg/dL — ABNORMAL HIGH (ref 70–99)
Glucose-Capillary: 127 mg/dL — ABNORMAL HIGH (ref 70–99)

## 2022-10-25 MED ORDER — LEVOTHYROXINE SODIUM 50 MCG PO TABS
75.0000 ug | ORAL_TABLET | Freq: Every day | ORAL | Status: DC
  Administered 2022-10-25 – 2022-10-27 (×3): 75 ug
  Filled 2022-10-25 (×4): qty 1

## 2022-10-25 MED ORDER — METOPROLOL TARTRATE 5 MG/5ML IV SOLN
2.5000 mg | Freq: Two times a day (BID) | INTRAVENOUS | Status: DC
  Administered 2022-10-25 – 2022-10-27 (×6): 2.5 mg via INTRAVENOUS
  Filled 2022-10-25 (×6): qty 5

## 2022-10-25 MED ORDER — CARBAMAZEPINE 100 MG/5ML PO SUSP
200.0000 mg | Freq: Three times a day (TID) | ORAL | Status: DC
  Administered 2022-10-25 – 2022-10-27 (×8): 200 mg
  Filled 2022-10-25 (×21): qty 10

## 2022-10-25 MED ORDER — POTASSIUM CHLORIDE 10 MEQ/100ML IV SOLN
10.0000 meq | INTRAVENOUS | Status: AC
  Administered 2022-10-25 (×4): 10 meq via INTRAVENOUS
  Filled 2022-10-25 (×4): qty 100

## 2022-10-25 MED ORDER — MAGNESIUM SULFATE 2 GM/50ML IV SOLN
2.0000 g | Freq: Once | INTRAVENOUS | Status: AC
  Administered 2022-10-25: 2 g via INTRAVENOUS
  Filled 2022-10-25: qty 50

## 2022-10-25 MED ORDER — MAGNESIUM SULFATE IN D5W 1-5 GM/100ML-% IV SOLN
1.0000 g | Freq: Once | INTRAVENOUS | Status: AC
  Administered 2022-10-25: 1 g via INTRAVENOUS
  Filled 2022-10-25: qty 100

## 2022-10-25 NOTE — Progress Notes (Signed)
   10/25/22 1725  Assess: MEWS Score  Temp (!) 101.2 F (38.4 C) (RN notified of temp.)  ECG Heart Rate 93  Resp (!) 27  Assess: MEWS Score  MEWS Temp 1  MEWS Systolic 0  MEWS Pulse 0  MEWS RR 2  MEWS LOC 0  MEWS Score 3  MEWS Score Color Yellow  Assess: if the MEWS score is Yellow or Red  Were vital signs taken at a resting state? Yes  Focused Assessment Change from prior assessment (see assessment flowsheet)  Does the patient meet 2 or more of the SIRS criteria? Yes  Does the patient have a confirmed or suspected source of infection? Yes  Provider and Rapid Response Notified? No  MEWS guidelines implemented *See Row Information* Yes  Treat  Pain Scale Faces  Pain Score 0  Take Vital Signs  Increase Vital Sign Frequency  Yellow: Q 2hr X 2 then Q 4hr X 2, if remains yellow, continue Q 4hrs  Escalate  MEWS: Escalate Yellow: discuss with charge nurse/RN and consider discussing with provider and RRT  Notify: Charge Nurse/RN  Name of Charge Nurse/RN Notified Dinah Beers  Date Charge Nurse/RN Notified 10/25/22  Time Charge Nurse/RN Notified 1756  Provider Notification  Provider Name/Title Dr Darrick Meigs  Date Provider Notified 10/25/22  Time Provider Notified 75  Method of Notification  (secure Chat)  Notification Reason Other (Comment) (fever)  Provider response No new orders (Patient already on antibiotics)  Date of Provider Response 10/25/22  Time of Provider Response 1757  Document  Patient Outcome Not stable and remains on department  Progress note created (see row info) Yes  Assess: SIRS CRITERIA  SIRS Temperature  1  SIRS Pulse 1  SIRS Respirations  1  SIRS WBC 0  SIRS Score Sum  3   Dr Darrick Meigs informed of Fever. Patient already on antibiotics.

## 2022-10-25 NOTE — Progress Notes (Signed)
[facility]  PROGRESS NOTE  Melanie Richardson ZOX:[id number] DOB: 05-03-1951 DOA: 11/05/22 PCP: Patient, No Pcp Per   Brief HPI:   72 years old female with history of some cognitive impairment, multiple sclerosis, hypertension, hypothyroidism, wheelchair-bound status, presented to hospital from assisted living facility with confusion, disorientation, mild agitation at the facility.   In the ED,  she was noted to have new ulceration on the foot.  Small sacral ulcer was also noted.   Patient received Rocephin IV in the ED, urine culture and blood culture was sent from the ED.  After hospitalization, patient was encephalopathic, podiatry was consulted and CT scan of the foot with contrast showed evidence of osteomyelitis. Patient underwent partial fifth ray amputation of left foot.  Subjective   Patient seen and examined, lethargic.  Not answering questions.  Mittens in place   Assessment/Plan:     Metabolic encephalopathy -Patient has underlying cognitive dysfunction -She is s/p surgical intervention -Currently on vancomycin and ceftriaxone -Blood cultures negative to date -at baseline she is able to recognize her family members. Mostly wheelchair-bound. Recent decline since Christmas as per the family.  -Patient started on core track feeding tube, palliative care consulted  Sepsis secondary to left foot cellulitis/fifth metatarsal osteomyelitis -ESR was 18; CT of the foot showed left fifth metatarsal osteomyelitis -MRI could not be done due to posture and agitation -Underwent partial fifth ray amputation of left foot  Hypokalemia -Potassium is 2.9 this morning -Will give IV KCl 10 mEq x 4 doses -Follow serum potassium level in a.m.  Hypomagnesemia -replace magnesium, and check mag level in am   History of multiple sclerosis with mild cognitive dysfunction -Patient resides at skilled facility, wheelchair-bound -PT OT consulted, recommend skilled nursing facility at  this time  Hypothyroidism -TSH 5.5 -Current Synthroid  Stage II chronic pressure ulcer -Wound care consulted  Goals of care -Palliative care consulted, no PEG -Short-term trial of core track, continue weekend and follow-up on Monday to evaluate condition -If worsening and no improvement likely transition to focus on comfort  Medications     carBAMazepine  200 mg Per Tube TID   cycloSPORINE  1 drop Both Eyes BID   enoxaparin (LOVENOX) injection  40 mg Subcutaneous Q24H   feeding supplement (OSMOLITE 1.5 CAL)  1,000 mL Per Tube Q24H   feeding supplement (PROSource TF20)  60 mL Per Tube Daily   leptospermum manuka honey  1 Application Topical Daily   levothyroxine  75 mcg Per Tube Q0600   metoprolol tartrate  2.5 mg Intravenous Q12H     Data Reviewed:   CBG:  Recent Labs  Lab 10/24/22 2055 10/24/22 2353 10/25/22 0418 10/25/22 0754 10/25/22 1132  GLUCAP 114* 101* 101* 115* 126*    SpO2: 94 % O2 Flow Rate (L/min): 4 L/min    Vitals:   10/24/22 2049 10/25/22 0415 10/25/22 0558 10/25/22 0939  BP: 135/80 (!) 146/77  (!) 157/94  Pulse: 90 (!) 106  93  Resp: 16 16  (!) 22  Temp: 98.6 F (37 C) 100.1 F (37.8 C) 98.7 F (37.1 C) 99.3 F (37.4 C)  TempSrc: Oral Oral Oral Oral  SpO2: 97% 94%  94%  Weight:  55 kg    Height:          Data Reviewed:  Basic Metabolic Panel: Recent Labs  Lab 10/21/22 0554 10/22/22 0642 10/22/2022 0632 10/24/22 0541 10/25/22 0652  NA 133* 132* 132* 135 135  K 2.6* 2.6* 3.5 3.2* 2.9*  CL  101 102 107 105 104  CO2 22 23 19* 21* 21*  GLUCOSE 122* 121* 114* 111* 106*  BUN <5* <5* <5* <5* 5*  CREATININE 0.54 0.58 0.51 0.50 0.42*  CALCIUM 7.2* 7.3* 7.6* 7.9* 8.1*  MG 1.4* 1.6* 1.7 1.7 1.5*  PHOS  --   --   --   --  2.6    CBC: Recent Labs  Lab 10/27/2022 1233 10/20/22 0507 10/22/22 0642 10/22/2022 0632 10/24/22 0541  WBC 12.5* 8.3 9.0 6.9 7.8  NEUTROABS 10.0*  --   --   --   --   HGB 11.6* 10.3* 10.6* 11.3* 10.9*  HCT  34.5* 30.3* 30.6* 32.7* 32.5*  MCV 91.5 90.4 87.2 88.4 89.0  PLT PLATELET CLUMPS NOTED ON SMEAR, COUNT APPEARS ADEQUATE 288 296 318 345    LFT Recent Labs  Lab 10/31/2022 1233 10/20/22 0507 10/21/22 0554  AST 28 23 37  ALT 18 15 19   ALKPHOS 56 40 41  BILITOT 0.5 0.9 0.5  PROT 8.1 6.2* 6.3*  ALBUMIN 3.7 2.8* 2.9*     Antibiotics: Anti-infectives (From admission, onward)    Start     Dose/Rate Route Frequency Ordered Stop   10/20/22 1800  vancomycin (VANCOCIN) IVPB 1000 mg/200 mL premix        1,000 mg 200 mL/hr over 60 Minutes Intravenous Every 24 hours 10/10/2022 1839 10/27/22 1759   10/20/22 1800  cefTRIAXone (ROCEPHIN) 2 g in sodium chloride 0.9 % 100 mL IVPB        2 g 200 mL/hr over 30 Minutes Intravenous Every 24 hours 10/20/22 1104     10/20/22 0600  ceFEPIme (MAXIPIME) 2 g in sodium chloride 0.9 % 100 mL IVPB  Status:  Discontinued        2 g 200 mL/hr over 30 Minutes Intravenous Every 12 hours 10/16/2022 1839 10/20/22 1104   10/29/2022 1830  vancomycin (VANCOCIN) IVPB 1000 mg/200 mL premix        1,000 mg 200 mL/hr over 60 Minutes Intravenous  Once 10/26/2022 1828 11/02/2022 1941   10/25/2022 1830  ceFEPIme (MAXIPIME) 2 g in sodium chloride 0.9 % 100 mL IVPB        2 g 200 mL/hr over 30 Minutes Intravenous  Once 10/25/2022 1828 10/30/2022 1857   10/13/2022 1745  cefTRIAXone (ROCEPHIN) 1 g in sodium chloride 0.9 % 100 mL IVPB        1 g 200 mL/hr over 30 Minutes Intravenous  Once 11/03/2022 1736 10/26/2022 1833        DVT prophylaxis: Lovenox  Code Status: DNR  Family Communication:    CONSULTS podiatry   Objective    Physical Examination:   General: Appears lethargic core track feeding tube in place Cardiovascular: S1-S2, regular, no murmur auscultated Respiratory: Clear to auscultation bilaterally Abdomen: Abdomen is soft, nontender, no organomegaly Extremities: No edema in the lower extremities Neurologic: Alert, confused   Status is: Inpatient:          Melanie Richardson   [facility] If 7PM-7AM, please contact night-coverage at [email or web address], Office  [phone]   10/25/2022, 12:15 PM  LOS: 5 days

## 2022-10-25 NOTE — Progress Notes (Signed)
Feeding tube replaced, pt tolerated procedure well.

## 2022-10-25 NOTE — Plan of Care (Signed)

## 2022-10-25 NOTE — Progress Notes (Signed)
Unable to flush pts feeding tube. Notified ICU charge nurse, who will come assist when able.

## 2022-10-26 DIAGNOSIS — L03032 Cellulitis of left toe: Secondary | ICD-10-CM | POA: Diagnosis not present

## 2022-10-26 DIAGNOSIS — E039 Hypothyroidism, unspecified: Secondary | ICD-10-CM | POA: Diagnosis not present

## 2022-10-26 DIAGNOSIS — G9341 Metabolic encephalopathy: Secondary | ICD-10-CM | POA: Diagnosis not present

## 2022-10-26 LAB — BASIC METABOLIC PANEL
Anion gap: 9 (ref 5–15)
BUN: 11 mg/dL (ref 8–23)
CO2: 22 mmol/L (ref 22–32)
Calcium: 8.1 mg/dL — ABNORMAL LOW (ref 8.9–10.3)
Chloride: 106 mmol/L (ref 98–111)
Creatinine, Ser: 0.62 mg/dL (ref 0.44–1.00)
GFR, Estimated: >60 mL/min (ref >60)
Glucose, Bld: 134 mg/dL — ABNORMAL HIGH (ref 70–99)
Potassium: 2.8 mmol/L — ABNORMAL LOW (ref 3.5–5.1)
Sodium: 137 mmol/L (ref 135–145)

## 2022-10-26 LAB — GLUCOSE, CAPILLARY
Glucose-Capillary: 124 mg/dL — ABNORMAL HIGH (ref 70–99)
Glucose-Capillary: 125 mg/dL — ABNORMAL HIGH (ref 70–99)
Glucose-Capillary: 127 mg/dL — ABNORMAL HIGH (ref 70–99)
Glucose-Capillary: 133 mg/dL — ABNORMAL HIGH (ref 70–99)
Glucose-Capillary: 137 mg/dL — ABNORMAL HIGH (ref 70–99)
Glucose-Capillary: 141 mg/dL — ABNORMAL HIGH (ref 70–99)

## 2022-10-26 LAB — MAGNESIUM: Magnesium: 2.2 mg/dL (ref 1.7–2.4)

## 2022-10-26 MED ORDER — POTASSIUM CHLORIDE 10 MEQ/100ML IV SOLN
10.0000 meq | INTRAVENOUS | Status: AC
  Administered 2022-10-26 (×6): 10 meq via INTRAVENOUS
  Filled 2022-10-26 (×6): qty 100

## 2022-10-26 NOTE — Progress Notes (Signed)
[facility]  PROGRESS NOTE  Melanie Richardson NKN:[id number] DOB: 1950/12/19 DOA: 10/10/2022 PCP: Patient, No Pcp Per   Brief HPI:   72 years old female with history of some cognitive impairment, multiple sclerosis, hypertension, hypothyroidism, wheelchair-bound status, presented to hospital from assisted living facility with confusion, disorientation, mild agitation at the facility.   In the ED,  she was noted to have new ulceration on the foot.  Small sacral ulcer was also noted.   Patient received Rocephin IV in the ED, urine culture and blood culture was sent from the ED.  After hospitalization, patient was encephalopathic, podiatry was consulted and CT scan of the foot with contrast showed evidence of osteomyelitis. Patient underwent partial fifth ray amputation of left foot.  Subjective   Patient is more alert, communicating.  Complains of discomfort in her nose ,due to core track feeding tube.   Assessment/Plan:     Metabolic encephalopathy -Patient has underlying cognitive dysfunction -She is s/p surgical intervention -Currently on vancomycin and ceftriaxone -Mental status has improved today -Blood cultures negative to date -at baseline she is able to recognize her family members. Mostly wheelchair-bound. Recent decline since Christmas as per the family.  -Patient started on core track feeding tube, palliative care consulted  Sepsis secondary to left foot cellulitis/fifth metatarsal osteomyelitis -ESR was 18; CT of the foot showed left fifth metatarsal osteomyelitis -MRI could not be done due to posture and agitation -Underwent partial fifth ray amputation of left foot  Hypokalemia -Potassium is 2.8 this morning despite replacement with IV potassium yesterday -Serum magnesium was replaced yesterday, this morning magnesium was 2.2 -Will give 6 rounds of IV KCl 10 mEq and follow BMP in am  Hypomagnesemia -Magnesium was 1.5 yesterday -Replete   History of multiple  sclerosis with mild cognitive dysfunction -Patient resides at skilled facility, wheelchair-bound -PT OT consulted, recommend skilled nursing facility at this time  Hypothyroidism -TSH 5.5 -Current Synthroid  Stage II chronic pressure ulcer -Wound care consulted  Goals of care -Palliative care consulted, no PEG -Short-term trial of core track, continue weekend and follow-up on Monday to evaluate condition -If worsening and no improvement likely transition to focus on comfort  Medications     carBAMazepine  200 mg Per Tube TID   cycloSPORINE  1 drop Both Eyes BID   enoxaparin (LOVENOX) injection  40 mg Subcutaneous Q24H   feeding supplement (OSMOLITE 1.5 CAL)  1,000 mL Per Tube Q24H   feeding supplement (PROSource TF20)  60 mL Per Tube Daily   leptospermum manuka honey  1 Application Topical Daily   levothyroxine  75 mcg Per Tube Q0600   metoprolol tartrate  2.5 mg Intravenous Q12H     Data Reviewed:   CBG:  Recent Labs  Lab 10/25/22 1658 10/25/22 1954 10/26/22 0003 10/26/22 0533 10/26/22 0748  GLUCAP 127* 127* 124* 133* 141*    SpO2: 95 % O2 Flow Rate (L/min): 4 L/min    Vitals:   10/25/22 1952 10/25/22 2122 10/26/22 0123 10/26/22 0529  BP: 116/78 109/67 124/68 109/88  Pulse: 77 98 82 100  Resp: 14 16 14 16   Temp: 98.8 F (37.1 C) (!) 100.4 F (38 C) 99.5 F (37.5 C) 97.9 F (36.6 C)  TempSrc: Oral Oral Oral Oral  SpO2: 95% 92% 93% 95%  Weight:    55.9 kg  Height:          Data Reviewed:  Basic Metabolic Panel: Recent Labs  Lab 10/22/22 0642 10/20/2022 0632 10/24/22 0541 10/25/22  1740 10/26/22 0836  NA 132* 132* 135 135 137  K 2.6* 3.5 3.2* 2.9* 2.8*  CL 102 107 105 104 106  CO2 23 19* 21* 21* 22  GLUCOSE 121* 114* 111* 106* 134*  BUN <5* <5* <5* 5* 11  CREATININE 0.58 0.51 0.50 0.42* 0.62  CALCIUM 7.3* 7.6* 7.9* 8.1* 8.1*  MG 1.6* 1.7 1.7 1.5* 2.2  PHOS  --   --   --  2.6  --     CBC: Recent Labs  Lab 11/06/22 1233 10/20/22 0507  10/22/22 0642 10/31/2022 0632 10/24/22 0541  WBC 12.5* 8.3 9.0 6.9 7.8  NEUTROABS 10.0*  --   --   --   --   HGB 11.6* 10.3* 10.6* 11.3* 10.9*  HCT 34.5* 30.3* 30.6* 32.7* 32.5*  MCV 91.5 90.4 87.2 88.4 89.0  PLT PLATELET CLUMPS NOTED ON SMEAR, COUNT APPEARS ADEQUATE 288 296 318 345    LFT Recent Labs  Lab 11-06-2022 1233 10/20/22 0507 10/21/22 0554  AST 28 23 37  ALT 18 15 19   ALKPHOS 56 40 41  BILITOT 0.5 0.9 0.5  PROT 8.1 6.2* 6.3*  ALBUMIN 3.7 2.8* 2.9*     Antibiotics: Anti-infectives (From admission, onward)    Start     Dose/Rate Route Frequency Ordered Stop   10/20/22 1800  vancomycin (VANCOCIN) IVPB 1000 mg/200 mL premix        1,000 mg 200 mL/hr over 60 Minutes Intravenous Every 24 hours 11/06/2022 1839 10/27/22 1759   10/20/22 1800  cefTRIAXone (ROCEPHIN) 2 g in sodium chloride 0.9 % 100 mL IVPB        2 g 200 mL/hr over 30 Minutes Intravenous Every 24 hours 10/20/22 1104     10/20/22 0600  ceFEPIme (MAXIPIME) 2 g in sodium chloride 0.9 % 100 mL IVPB  Status:  Discontinued        2 g 200 mL/hr over 30 Minutes Intravenous Every 12 hours 2022-11-06 1839 10/20/22 1104   2022-11-06 1830  vancomycin (VANCOCIN) IVPB 1000 mg/200 mL premix        1,000 mg 200 mL/hr over 60 Minutes Intravenous  Once 2022/11/06 1828 2022-11-06 1941   2022-11-06 1830  ceFEPIme (MAXIPIME) 2 g in sodium chloride 0.9 % 100 mL IVPB        2 g 200 mL/hr over 30 Minutes Intravenous  Once 11/06/22 1828 11-06-2022 1857   Nov 06, 2022 1745  cefTRIAXone (ROCEPHIN) 1 g in sodium chloride 0.9 % 100 mL IVPB        1 g 200 mL/hr over 30 Minutes Intravenous  Once 11/06/2022 1736 11-06-2022 1833        DVT prophylaxis: Lovenox  Code Status: DNR  Family Communication:    CONSULTS podiatry   Objective    Physical Examination:  Appears in no acute distress S1-S2, regular Clear to auscultation bilaterally Abdomen is soft, nontender, no organomegaly Neuro, she is alert this morning, answering questions  appropriately   Status is: Inpatient:         [name]   [facility] If 7PM-7AM, please contact night-coverage at [email or web address], Office  [phone]   10/26/2022, 9:44 AM  LOS: 6 days

## 2022-10-26 NOTE — Plan of Care (Signed)
Patient oriented to self only, baseline confusion and quiet volume.  Pt remains curled up in bed and doesn't move much.  VSS throughout shift, fever resolved last night.  All meds given via NGT.  NGT 60cm at nare.  Osmolite 1.5 at goal rate 43ml/hr.  Pt voided in bed, purewick still in place.  Bed alarm activated.  POC maintained, will continue to monitor.  Problem: Education: Goal: Knowledge of General Education information will improve Description: Including pain rating scale, medication(s)/side effects and non-pharmacologic comfort measures Outcome: Progressing   Problem: Health Behavior/Discharge Planning: Goal: Ability to manage health-related needs will improve Outcome: Progressing   Problem: Clinical Measurements: Goal: Ability to maintain clinical measurements within normal limits will improve Outcome: Progressing Goal: Will remain free from infection Outcome: Progressing Goal: Diagnostic test results will improve Outcome: Progressing Goal: Respiratory complications will improve Outcome: Progressing Goal: Cardiovascular complication will be avoided Outcome: Progressing   Problem: Activity: Goal: Risk for activity intolerance will decrease Outcome: Progressing   Problem: Nutrition: Goal: Adequate nutrition will be maintained Outcome: Progressing   Problem: Coping: Goal: Level of anxiety will decrease Outcome: Progressing   Problem: Elimination: Goal: Will not experience complications related to bowel motility Outcome: Progressing Goal: Will not experience complications related to urinary retention Outcome: Progressing   Problem: Pain Managment: Goal: General experience of comfort will improve Outcome: Progressing   Problem: Safety: Goal: Ability to remain free from injury will improve Outcome: Progressing   Problem: Skin Integrity: Goal: Risk for impaired skin integrity will decrease Outcome: Progressing   Problem: Safety: Goal: Non-violent  Restraint(s) Outcome: Progressing

## 2022-10-26 NOTE — Progress Notes (Signed)
Pharmacy Antibiotic Note  Melanie Richardson is a 72 y.o. female admitted on 01-Nov-2022 with cellulitis and ulceration on the left fifth toe.  CT concerning for osteo, podiatry recommends amputation.  Pharmacy has been consulted for vancomycin dosing.  10/26/2022 Day # 8 antibiotics S/p partial 5th ray amputation of L foot on 1/18 SCr 0.62 Tm 101.2 Last WBC 7.8 on 1/19 No positive cultures to date  Plan: Continue ceftriaxone per MD Continue vancomycin 1g IV q24h (eAUC 503.6, SCr rounded 0.8) Measure Vanc levels as needed.  Goal AUC = 400 - 550 Follow up renal function, culture results, and clinical course. ? Desired length of therapy   Height: 5\' 2"  (157.5 cm) Weight: 55.9 kg (123 lb 3.8 oz) IBW/kg (Calculated) : 50.1  Temp (24hrs), Avg:99.7 F (37.6 C), Min:97.9 F (36.6 C), Max:101.2 F (38.4 C)  Recent Labs  Lab 10/20/22 0507 10/21/22 0554 10/22/22 0642 10/28/2022 0632 10/24/22 0541 10/25/22 0652 10/26/22 0836  WBC 8.3  --  9.0 6.9 7.8  --   --   CREATININE 0.58   < > 0.58 0.51 0.50 0.42* 0.62   < > = values in this interval not displayed.     Estimated Creatinine Clearance: 51 mL/min (by C-G formula based on SCr of 0.62 mg/dL).    Allergies  Allergen Reactions   Ciprofloxacin Other (See Comments)    Dizziness and headache   Codeine Other (See Comments)    GI upset   Erythromycin Other (See Comments)    Unknown    Antimicrobials this admission: 10/13/2022 Rocephin x 1, resumed 1/15 >>  November 01, 2022 Cefepime >> 1/15 10/15/2022 Vancomycin >>  Microbiology results: 10/12/2022 BCx: ngF November 01, 2022 UCx: NGF  1/18 L toe: ngtd, gram stain neg   Thank you for allowing pharmacy to be a part of this patient's care.  Eudelia Bunch, Pharm.D Use secure chat for questions 10/26/2022 3:08 PM

## 2022-10-26 NOTE — Progress Notes (Signed)
  Subjective:  Patient ID: Melanie Richardson, female    DOB: [birth year],  MRN: [id number]  Patient seen at [facility] POD# 3 left partial 5th ray resection. Patient arousable but non conversational  Unable to obtain reliable ROS Objective:   Vitals:   10/26/22 1412 10/26/22 2027  BP: (!) 153/97 (!) 159/90  Pulse: 77 (!) 110  Resp: 19 18  Temp: 98 F (36.7 C) 97.8 F (36.6 C)  SpO2: 98% 94%   General No obvious signs of distress  Vascular Pedal pulses palpable. Foot WWP  Neurologic Epicritic sensation grossly intact.  Dermatologic Incision well coapted  Orthopedic: Motor intact    Assessment & Plan:  Patient was evaluated and treated and all questions answered.   -Incision healing well - No further OR plans -WBAT to LLE in post op shoe -F/U as outpatient with Dr Amalia Hailey in 2 weeks -We will sign off. Please call if questions or concerns  Criselda Peaches, DPM  Accessible via secure chat for questions or concerns.

## 2022-10-27 DIAGNOSIS — E440 Moderate protein-calorie malnutrition: Secondary | ICD-10-CM

## 2022-10-27 DIAGNOSIS — M86172 Other acute osteomyelitis, left ankle and foot: Secondary | ICD-10-CM | POA: Diagnosis not present

## 2022-10-27 DIAGNOSIS — L97522 Non-pressure chronic ulcer of other part of left foot with fat layer exposed: Secondary | ICD-10-CM | POA: Diagnosis not present

## 2022-10-27 DIAGNOSIS — L03032 Cellulitis of left toe: Secondary | ICD-10-CM | POA: Diagnosis not present

## 2022-10-27 DIAGNOSIS — G9341 Metabolic encephalopathy: Secondary | ICD-10-CM | POA: Diagnosis not present

## 2022-10-27 DIAGNOSIS — E039 Hypothyroidism, unspecified: Secondary | ICD-10-CM | POA: Diagnosis not present

## 2022-10-27 DIAGNOSIS — G35 Multiple sclerosis: Secondary | ICD-10-CM | POA: Diagnosis not present

## 2022-10-27 LAB — BASIC METABOLIC PANEL
Anion gap: 12 (ref 5–15)
BUN: 14 mg/dL (ref 8–23)
CO2: 21 mmol/L — ABNORMAL LOW (ref 22–32)
Calcium: 8.4 mg/dL — ABNORMAL LOW (ref 8.9–10.3)
Chloride: 106 mmol/L (ref 98–111)
Creatinine, Ser: 0.59 mg/dL (ref 0.44–1.00)
GFR, Estimated: >60 mL/min (ref >60)
Glucose, Bld: 124 mg/dL — ABNORMAL HIGH (ref 70–99)
Potassium: 3.2 mmol/L — ABNORMAL LOW (ref 3.5–5.1)
Sodium: 139 mmol/L (ref 135–145)

## 2022-10-27 LAB — GLUCOSE, CAPILLARY
Glucose-Capillary: 111 mg/dL — ABNORMAL HIGH (ref 70–99)
Glucose-Capillary: 115 mg/dL — ABNORMAL HIGH (ref 70–99)
Glucose-Capillary: 121 mg/dL — ABNORMAL HIGH (ref 70–99)
Glucose-Capillary: 125 mg/dL — ABNORMAL HIGH (ref 70–99)
Glucose-Capillary: 128 mg/dL — ABNORMAL HIGH (ref 70–99)
Glucose-Capillary: 139 mg/dL — ABNORMAL HIGH (ref 70–99)

## 2022-10-27 MED ORDER — POTASSIUM CHLORIDE 10 MEQ/100ML IV SOLN
10.0000 meq | INTRAVENOUS | Status: DC
  Administered 2022-10-27 (×3): 10 meq via INTRAVENOUS
  Filled 2022-10-27 (×3): qty 100

## 2022-10-27 NOTE — Progress Notes (Signed)
SLP Cancellation Note  Patient Details Name: Melanie Richardson MRN: [id number] DOB: 01/06/1951   Cancelled treatment:       Reason Eval/Treat Not Completed: Patient declined, no reason specified. Patient continues to refuse SLP attempts at completing bedside swallow evaluation. Today she had noticeable clear-whitish saliva on lips which SLP removed with toothette sponge. She would not open mouth when SLP attempting oral care and would turn head away, push away with hands. She would open  eyes very briefly when SLP requested but is not adequately attentive. SLP continues to recommend NPO status. MD note from 10/26/22 reports that patient's mentation was somewhat improved. SLP plans to continue to follow for patient readiness but will likely s/o if no improvement this week.   Sonia Baller, MA, CCC-SLP Speech Therapy

## 2022-10-27 NOTE — Progress Notes (Signed)
Nutrition Follow-up  DOCUMENTATION CODES:   Non-severe (moderate) malnutrition in context of chronic illness  INTERVENTION:   -Continue Osmolite 1.5 @ 40 ml/hr via NGT -60 ml Prosource TF 20 daily -Provides at goal: 1520 kcals, 80g protein and 731 ml H2O -Free water recommendation: 200 ml every 6 hours (800 ml)  NUTRITION DIAGNOSIS:   Moderate Malnutrition related to chronic illness (MS, Bell's Palsy) as evidenced by moderate fat depletion, severe muscle depletion.  Ongoing.  GOAL:   Patient will meet greater than or equal to 90% of their needs  Meeting with TF  MONITOR:   Labs, Weight trends, I & O's, TF, skin, GOC   ASSESSMENT:   72 years old female with history of some cognitive impairment, multiple sclerosis, hypertension, hypothyroidism, wheelchair-bound status, presented to hospital from assisted living facility with confusion, disorientation, mild agitation at the facility.  There was some mention of redness, swelling and tenderness of the bilateral lower extremities with an ulceration on the left fifth toe which was new for the last 2 weeks or so. Admitted for AMS, likely acute metabolic encephalopathy.  Patient in room, RN at bedside. Reports pt has been tolerating feeds today. Currently has Osmolite 1.5 running at 40 ml/hr. Initially was going to switch to Osmolite 1.2 given reported low stock of Osmolite 1.5 but adequate stock reported for another 24 hours. Will not switch at this time.  Palliative care to follow-up. Per note on 1/19, pt's son opted for no PEG for patient.   Admission weight: 130 lbs Current weight: 124 lbs  Medications reviewed.  Labs reviewed: CBGs: 125-139 Low K   Diet Order:   Diet Order             Diet NPO time specified  Diet effective now                   EDUCATION NEEDS:   Not appropriate for education at this time  Skin:  Skin Assessment: Skin Integrity Issues: Skin Integrity Issues:: Stage II, Incisions Stage II:  mid coccyx Incisions: 1/18 left foot  Last BM:  1/20 -type 6  Height:   Ht Readings from Last 1 Encounters:  2022-10-28 5\' 2"  (1.575 m)    Weight:   Wt Readings from Last 1 Encounters:  10/27/22 56.4 kg    BMI:  Body mass index is 22.74 kg/m.  Estimated Nutritional Needs:   Kcal:  1450-1650  Protein:  75-90g  Fluid:  1.6L/day  Clayton Bibles, MS, RD, LDN Inpatient Clinical Dietitian Contact information available via Amion

## 2022-10-27 NOTE — Progress Notes (Signed)
Chaplain received a referral to provide spiritual and emotional support to [place].  Chaplain met with Melanie Richardson, her youngest son, who shared about how quickly this has progressed.  He and his brother will be meeting with palliative care tomorrow to make some decisions about goals of care.  Melanie Richardson is grateful that they can both be present and that they are both supporting each other.  Melanie Richardson also has good support from his wife who is a Marine scientist in the system.  Chaplain will follow up tomorrow to provide additional support after the meeting.  [address], [place] Pager, [phone]

## 2022-10-27 NOTE — Progress Notes (Signed)
[facility]  PROGRESS NOTE  Melanie Richardson GEX:[id number] DOB: [birth year] DOA: 08-Nov-2022 PCP: Patient, No Pcp Per   Brief HPI:   72 years old female with history of some cognitive impairment, multiple sclerosis, hypertension, hypothyroidism, wheelchair-bound status, presented to hospital from assisted living facility with confusion, disorientation, mild agitation at the facility.   In the ED,  she was noted to have new ulceration on the foot.  Small sacral ulcer was also noted.   Patient received Rocephin IV in the ED, urine culture and blood culture was sent from the ED.  After hospitalization, patient was encephalopathic, podiatry was consulted and CT scan of the foot with contrast showed evidence of osteomyelitis. Patient underwent partial fifth ray amputation of left foot.  Subjective   Patient seen and examined, she is lethargic this morning.   Assessment/Plan:     Metabolic encephalopathy -Patient has underlying cognitive dysfunction -She is s/p surgical intervention -Currently on vancomycin and ceftriaxone -Mental status has improved today -Blood cultures negative to date -at baseline she is able to recognize her family members. Mostly wheelchair-bound. Recent decline since Christmas as per the family.  -Patient started on core track feeding tube, palliative care following  Sepsis secondary to left foot cellulitis/fifth metatarsal osteomyelitis -ESR was 18; CT of the foot showed left fifth metatarsal osteomyelitis -MRI could not be done due to posture and agitation -Underwent partial fifth ray amputation of left foot  Hypokalemia -Potassium is still low at 3.2 this morning despite replacement with IV potassium yesterday -Serum magnesium was 2.2 -Will give 3 g of IV KCl 10 mEq  Hypomagnesemia -Magnesium was 1.5  -Replete   History of multiple sclerosis with mild cognitive dysfunction -Patient resides at skilled facility, wheelchair-bound -PT OT consulted,  recommend skilled nursing facility at this time  Hypothyroidism -TSH 5.5 -Current Synthroid  Stage II chronic pressure ulcer -Wound care consulted  Goals of care -Palliative care consulted, no PEG -Short-term trial of core track, continue weekend and follow-up on Monday to evaluate condition -If worsening and no improvement likely transition to focus on comfort  Medications     carBAMazepine  200 mg Per Tube TID   cycloSPORINE  1 drop Both Eyes BID   enoxaparin (LOVENOX) injection  40 mg Subcutaneous Q24H   feeding supplement (OSMOLITE 1.5 CAL)  1,000 mL Per Tube Q24H   feeding supplement (PROSource TF20)  60 mL Per Tube Daily   leptospermum manuka honey  1 Application Topical Daily   levothyroxine  75 mcg Per Tube Q0600   metoprolol tartrate  2.5 mg Intravenous Q12H     Data Reviewed:   CBG:  Recent Labs  Lab 10/26/22 1629 10/26/22 1954 10/27/22 0010 10/27/22 0416 10/27/22 0720  GLUCAP 125* 127* 128* 139* 125*    SpO2: 94 % O2 Flow Rate (L/min): 4 L/min    Vitals:   10/26/22 2027 10/27/22 0413 10/27/22 0700 10/27/22 0736  BP: (!) 159/90 (!) 176/78 (!) 176/78 (!) 151/84  Pulse: (!) 110 99    Resp: 18 18    Temp: 97.8 F (36.6 C) 99.9 F (37.7 C)  99.4 F (37.4 C)  TempSrc: Oral Oral  Axillary  SpO2: 94% 94%    Weight:  56.4 kg    Height:          Data Reviewed:  Basic Metabolic Panel: Recent Labs  Lab 10/22/22 0642 10/07/2022 0632 10/24/22 0541 10/25/22 0652 10/26/22 0836 10/27/22 0751  NA 132* 132* 135 135 137 139  K 2.6*  3.5 3.2* 2.9* 2.8* 3.2*  CL 102 107 105 104 106 106  CO2 23 19* 21* 21* 22 21*  GLUCOSE 121* 114* 111* 106* 134* 124*  BUN <5* <5* <5* 5* 11 14  CREATININE 0.58 0.51 0.50 0.42* 0.62 0.59  CALCIUM 7.3* 7.6* 7.9* 8.1* 8.1* 8.4*  MG 1.6* 1.7 1.7 1.5* 2.2  --   PHOS  --   --   --  2.6  --   --     CBC: Recent Labs  Lab 10/22/22 0642 10/13/2022 0632 10/24/22 0541  WBC 9.0 6.9 7.8  HGB 10.6* 11.3* 10.9*  HCT 30.6*  32.7* 32.5*  MCV 87.2 88.4 89.0  PLT 296 318 345    LFT Recent Labs  Lab 10/21/22 0554  AST 37  ALT 19  ALKPHOS 41  BILITOT 0.5  PROT 6.3*  ALBUMIN 2.9*     Antibiotics: Anti-infectives (From admission, onward)    Start     Dose/Rate Route Frequency Ordered Stop   10/20/22 1800  vancomycin (VANCOCIN) IVPB 1000 mg/200 mL premix        1,000 mg 200 mL/hr over 60 Minutes Intravenous Every 24 hours 11/04/2022 1839 10/26/22 1802   10/20/22 1800  cefTRIAXone (ROCEPHIN) 2 g in sodium chloride 0.9 % 100 mL IVPB        2 g 200 mL/hr over 30 Minutes Intravenous Every 24 hours 10/20/22 1104     10/20/22 0600  ceFEPIme (MAXIPIME) 2 g in sodium chloride 0.9 % 100 mL IVPB  Status:  Discontinued        2 g 200 mL/hr over 30 Minutes Intravenous Every 12 hours 10/31/2022 1839 10/20/22 1104   10/20/2022 1830  vancomycin (VANCOCIN) IVPB 1000 mg/200 mL premix        1,000 mg 200 mL/hr over 60 Minutes Intravenous  Once 10/24/2022 1828 10/11/2022 1941   11/05/2022 1830  ceFEPIme (MAXIPIME) 2 g in sodium chloride 0.9 % 100 mL IVPB        2 g 200 mL/hr over 30 Minutes Intravenous  Once 10/14/2022 1828 10/27/2022 1857   10/16/2022 1745  cefTRIAXone (ROCEPHIN) 1 g in sodium chloride 0.9 % 100 mL IVPB        1 g 200 mL/hr over 30 Minutes Intravenous  Once 10/15/2022 1736 10/27/2022 1833        DVT prophylaxis: Lovenox  Code Status: DNR  Family Communication:    CONSULTS podiatry   Objective    Physical Examination:  Appears lethargic S1-S2, regular, no murmur auscultated Lungs are clear to auscultation bilaterally Abdomen is soft, nontender, no organomegaly   Status is: Inpatient:         [name]   [facility] If 7PM-7AM, please contact night-coverage at [email or web address], Office  [phone]   10/27/2022, 9:33 AM  LOS: 7 days

## 2022-10-27 NOTE — Progress Notes (Signed)
Physical Therapy Treatment Patient Details Name: Melanie Richardson MRN: [id number] DOB: Aug 22, 1951 Today's Date: 10/27/2022   History of Present Illness Patient is a 72 years old female admitted 10/19/21 from assisted living facility with confusion, disorientation, mild agitation at the facility. There was some mention of redness swelling and tenderness of the bilateral lower extremities with an ulceration on the left fifth toe.  Pt found to have osteomyleitis and cellulitis , s/p 5th ray amputation 10/23/21.  Pt also with metabolic encephalopathy and currently requiring  cortrak.  Palliative is following and possibity for comfort if mentation does not improve.  Pt with history of some cognitive impairment, multiple sclerosis, hypertension, hypothyroidism, wheelchair-bound status,    PT Comments    Pt with no participation.  She is curled in fetal position and tries to return to that position during therapy.  Total A x 2 to transfer to EOB for a few mins then back to bed.  Rolling for ADLs/linen change as pt had BM.  Bil legs contracted in flexed position.  Noted palliative on board and mentioned comfort care if no progress.  Will maintain on PT caseload for now, but if no improvement or transitions to comfort care will sign off therapy.     Recommendations for follow up therapy are one component of a multi-disciplinary discharge planning process, led by the attending physician.  Recommendations may be updated based on patient status, additional functional criteria and insurance authorization.  Follow Up Recommendations  Skilled nursing-short term rehab (<3 hours/day) Can patient physically be transported by private vehicle: No   Assistance Recommended at Discharge Frequent or constant Supervision/Assistance  Patient can return home with the following Two people to help with walking and/or transfers;Two people to help with bathing/dressing/bathroom   Equipment Recommendations  None recommended by  PT    Recommendations for Other Services       Precautions / Restrictions Precautions Precautions: Fall     Mobility  Bed Mobility Overal bed mobility: Needs Assistance Bed Mobility: Rolling, Supine to Sit, Sit to Supine Rolling: Total assist, +2 for physical assistance, Min guard   Supine to sit: Total assist, +2 for physical assistance Sit to supine: Total assist, +2 for physical assistance   General bed mobility comments: pt not assisting except when rolled to back for linen change she rolls back to L side on her own    Transfers                   General transfer comment: deferred    Ambulation/Gait                   Stairs             Wheelchair Mobility    Modified Rankin (Stroke Patients Only)       Balance Overall balance assessment: Needs assistance Sitting-balance support: No upper extremity supported Sitting balance-Leahy Scale: Zero Sitting balance - Comments: Max A to sit EOB 4 mins, pt not assisting, tries to lay down.  Worked on trying to hold head up but pt resisting                                    Cognition Arousal/Alertness: Lethargic Behavior During Therapy: Restless Overall Cognitive Status: No family/caregiver present to determine baseline cognitive functioning  General Comments: Pt laying in fetal position and not following any commands.  She keeps eyes closed throughout session and resistive to movement        Exercises Other Exercises Other Exercises: Bil knees are becoming contracted in flexed position (L worse than R).  Worked on PROM 10 reps knee ext with 5 sec hold as able.    General Comments        Pertinent Vitals/Pain Pain Assessment Pain Assessment: No/denies pain    Home Living                          Prior Function            PT Goals (current goals can now be found in the care plan section) Progress towards PT  goals: Not progressing toward goals - comment    Frequency    Min 2X/week      PT Plan Current plan remains appropriate (consider d/cing therapy if no improvement in mentation/ability to participate)    Co-evaluation              AM-PAC PT "6 Clicks" Mobility   Outcome Measure  Help needed turning from your back to your side while in a flat bed without using bedrails?: Total Help needed moving from lying on your back to sitting on the side of a flat bed without using bedrails?: Total Help needed moving to and from a bed to a chair (including a wheelchair)?: Total Help needed standing up from a chair using your arms (e.g., wheelchair or bedside chair)?: Total Help needed to walk in hospital room?: Total Help needed climbing 3-5 steps with a railing? : Total 6 Click Score: 6    End of Session   Activity Tolerance: Other (comment) (limited ability to participate) Patient left: in bed;with call bell/phone within reach;with bed alarm set Nurse Communication: Mobility status PT Visit Diagnosis: Unsteadiness on feet (R26.81);Difficulty in walking, not elsewhere classified (R26.2)     Time: 1209-1229 PT Time Calculation (min) (ACUTE ONLY): 20 min  Charges:  $Therapeutic Activity: 8-22 mins                     Abran Richard, PT Acute Rehab [facility] [phone]    Karlton Lemon 10/27/2022, 12:53 PM

## 2022-10-27 NOTE — Care Management Important Message (Signed)
Important Message  Patient Details IM Letter given. Name: Melanie Richardson MRN: [id number] Date of Birth: [birth year]   Medicare Important Message Given:  Yes     Kerin Salen 10/27/2022, 9:49 AM

## 2022-10-27 NOTE — Plan of Care (Addendum)
Patient oriented to self only, baseline confusion and quiet volume. Pt remains curled up in bed and doesn't move much. VSS throughout shift, afebrile. SBP 176, PRN hydralazine given for relief.  All meds given via NGT. NGT 60cm at nare. Osmolite 1.5 at goal rate 22ml/hr. Pt voided in bed, purewick still in place. Bed alarm activated. POC maintained, will continue to monitor.   Problem: Education: Goal: Knowledge of General Education information will improve Description: Including pain rating scale, medication(s)/side effects and non-pharmacologic comfort measures Outcome: Progressing   Problem: Health Behavior/Discharge Planning: Goal: Ability to manage health-related needs will improve Outcome: Progressing   Problem: Clinical Measurements: Goal: Ability to maintain clinical measurements within normal limits will improve Outcome: Progressing Goal: Will remain free from infection Outcome: Progressing Goal: Diagnostic test results will improve Outcome: Progressing Goal: Respiratory complications will improve Outcome: Progressing Goal: Cardiovascular complication will be avoided Outcome: Progressing   Problem: Activity: Goal: Risk for activity intolerance will decrease Outcome: Progressing   Problem: Nutrition: Goal: Adequate nutrition will be maintained Outcome: Progressing   Problem: Coping: Goal: Level of anxiety will decrease Outcome: Progressing   Problem: Elimination: Goal: Will not experience complications related to bowel motility Outcome: Progressing Goal: Will not experience complications related to urinary retention Outcome: Progressing   Problem: Pain Managment: Goal: General experience of comfort will improve Outcome: Progressing   Problem: Safety: Goal: Ability to remain free from injury will improve Outcome: Progressing   Problem: Skin Integrity: Goal: Risk for impaired skin integrity will decrease Outcome: Progressing   Problem: Safety: Goal:  Non-violent Restraint(s) Outcome: Progressing

## 2022-10-27 NOTE — Progress Notes (Signed)
Palliative Medicine Progress Note   Patient Name: Melanie Richardson       Date: 10/27/2022 DOB: [birth year]  Age: 72 y.o. MRN#: [id number] Attending Physician: [name], MD Primary Care Physician: Patient, No Pcp Per Admit Date: 10/18/2022    HPI/Patient Profile: 72 y.o. female  with past medical history of cognitive impairment, multiple sclerosis, hypertension, hypothyroidism, and wheelchair-bound status admitted on 10/18/2022 with AMS. AMS felt to be r/t L foot cellulitis and fifth metatarsal osteomyelitis. Patient underwent surgical procedure 1/18. Patient has not eaten in 4 days, SLP unable to evaluate r/t mental status. PMT consulted to discuss Melanie Richardson.    Subjective: Chart reviewed and patient assessed at bedside. She is curled up on her side. When I asked how she is doing she states "ok", but otherwise not verbally responsive.   I spoke with patient's son Melanie Richardson by phone. We discussed that her mental status unfortunately has not improved. I shared my concern that Ms. Paszkiewicz seems to be approaching end-of-life in the setting of progressive chronic illness. Melanie Richardson verbalizes understanding and states family is considering transition to comfort care. He requests that we meet in-person mainly for his brother, who has some additional questions about patient's medical situation. He is going to call his brother to determine a meeting time.   I spoke with Melanie Richardson later and he states family is able to meet at 3:30 pm tomorrow. In the meantime, family wishes to discontinue labs, IV potassium, and cardiac monitoring.   Objective:  Physical Exam Vitals reviewed.  Constitutional:      General: She is awake. She is not in acute distress.    Appearance: She is ill-appearing.     Comments: Frail  Pulmonary:      Effort: Pulmonary effort is normal.  Psychiatric:        Cognition and Memory: Cognition is impaired.             Vital Signs: BP (!) 151/84 (BP Location: Left Arm)   Pulse 99   Temp 99.4 F (37.4 C) (Axillary)   Resp 18   Ht 5\' 2"  (1.575 m)   Wt 56.4 kg   LMP 10/06/1996   SpO2 94%   BMI 22.74 kg/m  SpO2: SpO2: 94 % O2 Device: O2 Device: Room Air    Palliative Medicine Assessment & Plan  Assessment: Principal Problem:   Acute metabolic encephalopathy Active Problems:   MS (multiple sclerosis) (HCC)   Hypothyroidism   Weakness   Hypertension   Cellulitis of fifth toe of left foot   Sacral decubitus ulcer   Sepsis (HCC)   Hypomagnesemia   Malnutrition of moderate degree    Recommendations/Plan: Patient's mental status has not improved - Family is leaning toward transition to comfort Per family request, discontinue labs, cardiac monitoring, and IV potassium Family meeting tomorrow at 3:30 pm   Code Status: DNR   Discharge Planning: To Be Determined   Care plan was discussed with Dr. Darrick Meigs  Thank you for allowing the Palliative Medicine Team to assist in the care of this patient.    Lavena Bullion, NP   Please contact Palliative Medicine Team phone at [phone] for questions and concerns.  For individual providers, please see AMION.

## 2022-10-28 DIAGNOSIS — E440 Moderate protein-calorie malnutrition: Secondary | ICD-10-CM | POA: Diagnosis not present

## 2022-10-28 DIAGNOSIS — G9341 Metabolic encephalopathy: Secondary | ICD-10-CM | POA: Diagnosis not present

## 2022-10-28 DIAGNOSIS — E039 Hypothyroidism, unspecified: Secondary | ICD-10-CM | POA: Diagnosis not present

## 2022-10-28 DIAGNOSIS — L03032 Cellulitis of left toe: Secondary | ICD-10-CM | POA: Diagnosis not present

## 2022-10-28 DIAGNOSIS — G35 Multiple sclerosis: Secondary | ICD-10-CM | POA: Diagnosis not present

## 2022-10-28 LAB — GLUCOSE, CAPILLARY
Glucose-Capillary: 100 mg/dL — ABNORMAL HIGH (ref 70–99)
Glucose-Capillary: 103 mg/dL — ABNORMAL HIGH (ref 70–99)
Glucose-Capillary: 105 mg/dL — ABNORMAL HIGH (ref 70–99)
Glucose-Capillary: 116 mg/dL — ABNORMAL HIGH (ref 70–99)
Glucose-Capillary: 137 mg/dL — ABNORMAL HIGH (ref 70–99)

## 2022-10-28 LAB — AEROBIC/ANAEROBIC CULTURE W GRAM STAIN (SURGICAL/DEEP WOUND)
Culture: NO GROWTH
Gram Stain: NONE SEEN

## 2022-10-28 LAB — BASIC METABOLIC PANEL
Anion gap: 10 (ref 5–15)
BUN: 16 mg/dL (ref 8–23)
CO2: 24 mmol/L (ref 22–32)
Calcium: 8.2 mg/dL — ABNORMAL LOW (ref 8.9–10.3)
Chloride: 107 mmol/L (ref 98–111)
Creatinine, Ser: 0.6 mg/dL (ref 0.44–1.00)
GFR, Estimated: >60 mL/min (ref >60)
Glucose, Bld: 98 mg/dL (ref 70–99)
Potassium: 3.6 mmol/L (ref 3.5–5.1)
Sodium: 141 mmol/L (ref 135–145)

## 2022-10-28 MED ORDER — GLYCOPYRROLATE 0.2 MG/ML IJ SOLN: 0.2000 mg | INTRAMUSCULAR | Status: DC | PRN

## 2022-10-28 MED ORDER — LORAZEPAM 2 MG/ML IJ SOLN
1.0000 mg | INTRAMUSCULAR | Status: DC | PRN
  Administered 2022-10-28 – 2022-10-30 (×3): 1 mg via INTRAVENOUS
  Filled 2022-10-28 (×3): qty 1

## 2022-10-28 MED ORDER — MORPHINE BOLUS VIA INFUSION
2.0000 mg | INTRAVENOUS | Status: DC | PRN
  Administered 2022-10-30 – 2022-10-31 (×2): 2 mg via INTRAVENOUS

## 2022-10-28 MED ORDER — GLYCOPYRROLATE 1 MG PO TABS: 1.0000 mg | ORAL_TABLET | ORAL | Status: DC | PRN

## 2022-10-28 MED ORDER — LORAZEPAM 1 MG PO TABS: 1.0000 mg | ORAL_TABLET | ORAL | Status: DC | PRN

## 2022-10-28 MED ORDER — METOPROLOL TARTRATE 5 MG/5ML IV SOLN: 2.5000 mg | Freq: Three times a day (TID) | INTRAVENOUS | Status: DC

## 2022-10-28 MED ORDER — LORAZEPAM 2 MG/ML PO CONC: 1.0000 mg | ORAL | Status: DC | PRN

## 2022-10-28 MED ORDER — POLYVINYL ALCOHOL 1.4 % OP SOLN: 1.0000 [drp] | Freq: Four times a day (QID) | OPHTHALMIC | Status: DC | PRN

## 2022-10-28 MED ORDER — MORPHINE 100MG IN NS 100ML (1MG/ML) PREMIX INFUSION
1.0000 mg/h | INTRAVENOUS | Status: DC
  Administered 2022-10-28 – 2022-10-30 (×2): 1 mg/h via INTRAVENOUS
  Administered 2022-10-31: 3 mg/h via INTRAVENOUS
  Filled 2022-10-28 (×4): qty 100

## 2022-10-28 MED ORDER — BIOTENE DRY MOUTH MT LIQD: 15.0000 mL | OROMUCOSAL | Status: DC | PRN

## 2022-10-28 NOTE — Progress Notes (Addendum)
[facility]  PROGRESS NOTE  Melanie Richardson [id number]:[id number] DOB: [birth year] DOA: 10/26/2022 PCP: Patient, No Pcp Per   Brief HPI:   72 years old female with history of some cognitive impairment, multiple sclerosis, hypertension, hypothyroidism, wheelchair-bound status, presented to hospital from assisted living facility with confusion, disorientation, mild agitation at the facility.   In the ED,  she was noted to have new ulceration on the foot.  Small sacral ulcer was also noted.   Patient received Rocephin IV in the ED, urine culture and blood culture was sent from the ED.  After hospitalization, patient was encephalopathic, podiatry was consulted and CT scan of the foot with contrast showed evidence of osteomyelitis. Patient underwent partial fifth ray amputation of left foot.  Subjective   Patient seen and examined, continues to be lethargic.   Assessment/Plan:     Metabolic encephalopathy -Patient has underlying cognitive dysfunction -She is s/p surgical intervention -Currently on vancomycin and ceftriaxone -Mental status has improved today -Blood cultures negative to date -at baseline she is able to recognize her family members. Mostly wheelchair-bound. Recent decline since Christmas as per the family.  -Patient started on core track feeding tube, palliative care following -Family leaning towards comfort, final decision after today's meeting at 3:30 PM.  Sepsis secondary to left foot cellulitis/fifth metatarsal osteomyelitis -ESR was 18; CT of the foot showed left fifth metatarsal osteomyelitis -MRI could not be done due to posture and agitation -Underwent partial fifth ray amputation of left foot  Hypokalemia -Potassium was 3.2 yesterday; it was replaced -Family not interested in getting more lab draws and replacing potassium  Hypomagnesemia -Magnesium was 1.5  -Replete   History of multiple sclerosis with mild cognitive dysfunction -Patient resides at skilled  facility, wheelchair-bound -PT OT consulted, recommend skilled nursing facility at this time  Hypothyroidism -TSH 5.5 -Current Synthroid  Stage II chronic pressure ulcer -Wound care consulted  Goals of care -Palliative care consulted, no PEG -Short-term trial of core track, continue weekend and family discussed with palliative care.  -Patient has not significantly improved -Family leaning towards comfort care -Final decision today after meeting at 3:30 PM.  Medications     carBAMazepine  200 mg Per Tube TID   cycloSPORINE  1 drop Both Eyes BID   enoxaparin (LOVENOX) injection  40 mg Subcutaneous Q24H   feeding supplement (OSMOLITE 1.5 CAL)  1,000 mL Per Tube Q24H   feeding supplement (PROSource TF20)  60 mL Per Tube Daily   leptospermum manuka honey  1 Application Topical Daily   levothyroxine  75 mcg Per Tube Q0600   metoprolol tartrate  2.5 mg Intravenous Q12H     Data Reviewed:   CBG:  Recent Labs  Lab 10/27/22 1252 10/27/22 1606 10/27/22 2016 10/28/22 0008 10/28/22 0450  GLUCAP 115* 111* 121* 105* 137*    SpO2: 98 % O2 Flow Rate (L/min): 4 L/min    Vitals:   10/27/22 2015 10/28/22 0500 10/28/22 0555 10/28/22 0559  BP: (!) 143/93  (!) 159/90 (!) 159/90  Pulse: (!) 109  (!) 115 (!) 110  Resp: 18   18  Temp: 100.1 F (37.8 C)   98.7 F (37.1 C)  TempSrc: Oral   Oral  SpO2: 98%   98%  Weight:  53.4 kg    Height:          Data Reviewed:  Basic Metabolic Panel: Recent Labs  Lab 10/22/22 0642 11/02/2022 4481 10/24/22 0541 10/25/22 0652 10/26/22 0836 10/27/22 0751  NA 132* 132*  135 135 137 139  K 2.6* 3.5 3.2* 2.9* 2.8* 3.2*  CL 102 107 105 104 106 106  CO2 23 19* 21* 21* 22 21*  GLUCOSE 121* 114* 111* 106* 134* 124*  BUN <5* <5* <5* 5* 11 14  CREATININE 0.58 0.51 0.50 0.42* 0.62 0.59  CALCIUM 7.3* 7.6* 7.9* 8.1* 8.1* 8.4*  MG 1.6* 1.7 1.7 1.5* 2.2  --   PHOS  --   --   --  2.6  --   --     CBC: Recent Labs  Lab 10/22/22 0642  10/06/2022 0632 10/24/22 0541  WBC 9.0 6.9 7.8  HGB 10.6* 11.3* 10.9*  HCT 30.6* 32.7* 32.5*  MCV 87.2 88.4 89.0  PLT 296 318 345    LFT No results for input(s): "AST", "ALT", "ALKPHOS", "BILITOT", "PROT", "ALBUMIN" in the last 168 hours.    Antibiotics: Anti-infectives (From admission, onward)    Start     Dose/Rate Route Frequency Ordered Stop   10/20/22 1800  vancomycin (VANCOCIN) IVPB 1000 mg/200 mL premix        1,000 mg 200 mL/hr over 60 Minutes Intravenous Every 24 hours 10/08/2022 1839 10/26/22 1802   10/20/22 1800  cefTRIAXone (ROCEPHIN) 2 g in sodium chloride 0.9 % 100 mL IVPB  Status:  Discontinued        2 g 200 mL/hr over 30 Minutes Intravenous Every 24 hours 10/20/22 1104 10/27/22 1036   10/20/22 0600  ceFEPIme (MAXIPIME) 2 g in sodium chloride 0.9 % 100 mL IVPB  Status:  Discontinued        2 g 200 mL/hr over 30 Minutes Intravenous Every 12 hours 10/10/2022 1839 10/20/22 1104   10/09/2022 1830  vancomycin (VANCOCIN) IVPB 1000 mg/200 mL premix        1,000 mg 200 mL/hr over 60 Minutes Intravenous  Once 10/31/2022 1828 10/18/2022 1941   11/04/2022 1830  ceFEPIme (MAXIPIME) 2 g in sodium chloride 0.9 % 100 mL IVPB        2 g 200 mL/hr over 30 Minutes Intravenous  Once 10/31/2022 1828 10/28/2022 1857   11/03/2022 1745  cefTRIAXone (ROCEPHIN) 1 g in sodium chloride 0.9 % 100 mL IVPB        1 g 200 mL/hr over 30 Minutes Intravenous  Once 10/14/2022 1736 10/14/2022 1833        DVT prophylaxis: Lovenox  Code Status: DNR  Family Communication:    CONSULTS podiatry   Objective    Physical Examination:  Lethargic this morning, not following commands Lungs are clear to auscultation bilaterally Abdomen is soft, nontender, no organomegaly   Status is: Inpatient:         [name]   [facility] If 7PM-7AM, please contact night-coverage at [email or web address], Office  [phone]   10/28/2022, 8:56 AM  LOS: 8 days

## 2022-10-28 NOTE — Progress Notes (Signed)
OT Cancellation Note  Patient Details Name: CHELLSEA BECKERS MRN: [id number] DOB: 04-Sep-1951   Cancelled Treatment:    Reason Eval/Treat Not Completed: Other (comment) Per palliative note, patient's family has meeting schedule today to discuss patient status. OT to hold until after meeting.  Rennie Plowman, MS Acute Rehabilitation Department Office# [phone]  10/28/2022, 10:56 AM

## 2022-10-28 NOTE — Progress Notes (Signed)
Palliative Medicine Progress Note   Patient Name: Melanie Richardson       Date: 10/28/2022 DOB: Sep 03, 1951  Age: 72 y.o. MRN#: [id number] Attending Physician: [name], MD Primary Care Physician: Patient, No Pcp Per Admit Date: 10/07/2022    HPI/Patient Profile: 72 y.o. female  with past medical history of cognitive impairment, multiple sclerosis, hypertension, hypothyroidism, and wheelchair-bound status admitted on 11/05/2022 with AMS. AMS felt to be r/t L foot cellulitis and fifth metatarsal osteomyelitis. Patient underwent surgical procedure Nov 02, 2022. Patient has not eaten in 4 days, SLP unable to evaluate r/t mental status. PMT consulted to discuss [place].     Subjective: Chart reviewed. Patient pulled out feeding tube this morning. On my assessment, she does not answer any questions and appears generally uncomfortable. She is covering her face with her hands in bilateral mittens.   Family Meeting: I met with both sons, [name] and Lennette Bihari, in the 6th floor waiting floor. We discussed that patient's mental status unfortunately has not improved. Discussed that she seems to be approaching end-of-life in the setting of progressive chronic illness and likely cognitive impairment.   The difference between full scope medical intervention and comfort care was considered.  Reviewed the concept of a comfort path, which means de-escalating full scope medical interventions and allowing a natural course to occur. Discussed that the goal is comfort and dignity rather than cure/prolonging life.   Discussed transitioning to comfort care while in the hospital, and what that would look like--keeping her clean and dry, no labs, no artificial hydration or feeding, no antibiotics, minimizing of medications, comfort feeds,  and medication for symptom management.    Both sons are in agreement to transition to comfort care today. Discussed option to transfer to a hospice facility for a more peaceful setting at end-of-life; family requests time to consider.    Objective:  Physical Exam Vitals reviewed.  Constitutional:      General: She is not in acute distress.    Appearance: She is ill-appearing.  Pulmonary:     Effort: Pulmonary effort is normal.  Psychiatric:        Cognition and Memory: Cognition is impaired. Memory is impaired.             Vital Signs: BP (!) 156/87 (BP Location: Left Arm)   Pulse (!) 110  Temp 97.9 F (36.6 C) (Oral)   Resp 18   Ht 5\' 2"  (1.575 m)   Wt 53.4 kg   LMP 10/06/1996   SpO2 98%   BMI 21.53 kg/m  SpO2: SpO2: 98 % O2 Device: O2 Device: Room Air   LBM: Last BM Date : 10/28/22     Palliative Assessment/Data: PPS 10-20%     Palliative Medicine Assessment & Plan   Assessment: Principal Problem:   Acute metabolic encephalopathy Active Problems:   MS (multiple sclerosis) (HCC)   Hypothyroidism   Weakness   Hypertension   Cellulitis of fifth toe of left foot   Sacral decubitus ulcer   Sepsis (HCC)   Hypomagnesemia   Malnutrition of moderate degree    Recommendations/Plan: Full comfort measures initiated DNR/DNI as previously documented Morphine infusion for comfort PMT will continue to follow   Symptom Management:  Lorazepam (ATIVAN) prn for anxiety Haloperidol (HALDOL) prn for agitation  Glycopyrrolate (ROBINUL) for excessive secretions Ondansetron (ZOFRAN) prn for nausea Polyvinyl alcohol (LIQUIFILM TEARS) prn for dry eyes Antiseptic oral rinse (BIOTENE) prn for dry mouth   Prognosis:  < 2 weeks  Discharge Planning: To Be Determined  Care plan was discussed with Dr. Darrick Meigs   Thank you for allowing the Palliative Medicine Team to assist in the care of this patient.  Time: 65 minutes   Lavena Bullion, NP   Please contact  Palliative Medicine Team phone at [phone] for questions and concerns.  For individual providers, please see AMION.

## 2022-10-28 NOTE — Progress Notes (Signed)
Pt has pulled out her feeding tube with mittens on. On call provider has been notified. Pt is asymptomatic,  A&O x1. VS obtained. RN will keep monitoring.

## 2022-10-29 DIAGNOSIS — G9341 Metabolic encephalopathy: Secondary | ICD-10-CM | POA: Diagnosis not present

## 2022-10-29 MED ORDER — LORAZEPAM 2 MG/ML IJ SOLN
0.5000 mg | Freq: Four times a day (QID) | INTRAMUSCULAR | Status: DC
  Administered 2022-10-29 – 2022-10-30 (×6): 0.5 mg via INTRAVENOUS
  Filled 2022-10-29 (×6): qty 1

## 2022-10-29 NOTE — Progress Notes (Signed)
Palliative Medicine Progress Note   Patient Name: Melanie Richardson       Date: 10/29/2022 DOB: 10/25/1950  Age: 72 y.o. MRN#: [id number] Attending Physician: Shelly Coss, MD Primary Care Physician: Patient, No Pcp Per Admit Date: 10/15/2022    HPI/Patient Profile: 72 y.o. female  with past medical history of cognitive impairment, multiple sclerosis, hypertension, hypothyroidism, and wheelchair-bound status admitted on 10/30/2022 with AMS. AMS felt to be r/t L foot cellulitis and fifth metatarsal osteomyelitis. Patient underwent surgical procedure 11/16/22. Patient has not eaten in 4 days, SLP unable to evaluate r/t mental status. PMT consulted to discuss [place].     Subjective: Chart reviewed. Patient seen and examined, resting in bed, mild distress, on Morphine drip.  .   Family Discussion: Called son [name] and discussed about residential hospice, he is in agreement, will place [facility] consult.    Objective:  Physical Exam Vitals reviewed.  Constitutional:      General: She is not in acute distress.    Appearance: She is ill-appearing.  Pulmonary:     Effort: Pulmonary effort is normal.  Psychiatric:        Cognition and Memory: Cognition is impaired. Memory is impaired.             Vital Signs: BP 119/81 (BP Location: Left Arm)   Pulse 92   Temp 98.5 F (36.9 C) (Axillary)   Resp 16   Ht 5\' 2"  (1.575 m)   Wt 53.4 kg   LMP 10/06/1996   SpO2 92%   BMI 21.53 kg/m  SpO2: SpO2: 92 % O2 Device: O2 Device: Room Air   LBM: Last BM Date : 10/28/22     Palliative Assessment/Data: PPS 10-20%     Palliative Medicine Assessment & Plan   Assessment: Principal Problem:   Acute metabolic encephalopathy Active Problems:   MS (multiple sclerosis) (HCC)   Hypothyroidism   Weakness    Hypertension   Cellulitis of fifth toe of left foot   Sacral decubitus ulcer   Sepsis (HCC)   Hypomagnesemia   Malnutrition of moderate degree    Recommendations/Plan: Full comfort measures initiated DNR/DNI as previously documented Morphine infusion for comfort PMT will continue to follow   Symptom Management:  Lorazepam (ATIVAN) prn for anxiety Haloperidol (HALDOL) prn for agitation  Glycopyrrolate (ROBINUL) for excessive secretions  Ondansetron (ZOFRAN) prn for nausea Polyvinyl alcohol (LIQUIFILM TEARS) prn for dry eyes Antiseptic oral rinse (BIOTENE) prn for dry mouth   Prognosis:  < 2 weeks  Discharge Planning: [facility] consult for residential hospice.   Care plan was discussed with Dr. Tawanna Solo.    Thank you for allowing the Palliative Medicine Team to assist in the care of this patient.  Mod MDM   Loistine Chance, MD   Please contact Palliative Medicine Team phone at [phone] for questions and concerns.  For individual providers, please see AMION.

## 2022-10-29 NOTE — Progress Notes (Signed)
PROGRESS NOTE  Melanie Richardson  SWF:[id number] DOB: 02/18/1951 DOA: 10/10/2022 PCP: Patient, No Pcp Per   Brief Narrative: Patient is a 72 year old female with history of cognitive impairment, multiple sclerosis, hypertension, hypothyroidism, wheelchair-bound status presented from assisted living facility with confusion, disorientation.  Found to have new ulcer on the foot on the left.  Started on antibiotics, culture sent.  Patient remained encephalopathic.  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  Patient had poor nutritional status, unable to eat.  Palliative care consulted.  After discussion of goals of care with family, she has been made comfort care.Plan for transfer to residential hospice  Assessment & Plan:  Principal Problem:   Acute metabolic encephalopathy Active Problems:   MS (multiple sclerosis) (HCC)   Hypothyroidism   Weakness   Hypertension   Cellulitis of fifth toe of left foot   Sacral decubitus ulcer   Sepsis (HCC)   Hypomagnesemia   Malnutrition of moderate degree  Metabolic encephalopathy: History of dementia/cognitive dysfunction.  Had persistent confusion during his hospitalization, for oral intake.  Made comfort care after discussion with palliative care and family.  Family said they are  interested to transfer her to residential hospice  Sepsis secondary to left facility/fifth metatarsal osteomyelitis: Underwent partial fifth ray amputation of left foot.  Currently on comfort care  Other medical problems: Multiple sclerosis, hypothyroidism, stage II chronic pressure ulcer.   Nutrition Problem: Moderate Malnutrition Etiology: chronic illness (MS, Bell's Palsy)    DVT prophylaxis:None     Code Status: DNR  Family Communication: Daughter-in-law at bedside  Patient status:Inpatient  Patient is from :ALF  Anticipated discharge to: Residential hospice    Consultants:  palliative  care,podiatry  Procedures:partial 5th ray amputation  Antimicrobials:  Anti-infectives (From admission, onward)    Start     Dose/Rate Route Frequency Ordered Stop   10/20/22 1800  vancomycin (VANCOCIN) IVPB 1000 mg/200 mL premix        1,000 mg 200 mL/hr over 60 Minutes Intravenous Every 24 hours 10/31/2022 1839 10/26/22 1802   10/20/22 1800  cefTRIAXone (ROCEPHIN) 2 g in sodium chloride 0.9 % 100 mL IVPB  Status:  Discontinued        2 g 200 mL/hr over 30 Minutes Intravenous Every 24 hours 10/20/22 1104 10/27/22 1036   10/20/22 0600  ceFEPIme (MAXIPIME) 2 g in sodium chloride 0.9 % 100 mL IVPB  Status:  Discontinued        2 g 200 mL/hr over 30 Minutes Intravenous Every 12 hours 10/17/2022 1839 10/20/22 1104   10/24/2022 1830  vancomycin (VANCOCIN) IVPB 1000 mg/200 mL premix        1,000 mg 200 mL/hr over 60 Minutes Intravenous  Once 10/27/2022 1828 10/11/2022 1941   10/31/2022 1830  ceFEPIme (MAXIPIME) 2 g in sodium chloride 0.9 % 100 mL IVPB        2 g 200 mL/hr over 30 Minutes Intravenous  Once 10/20/2022 1828 10/28/2022 1857   10/22/2022 1745  cefTRIAXone (ROCEPHIN) 1 g in sodium chloride 0.9 % 100 mL IVPB        1 g 200 mL/hr over 30 Minutes Intravenous  Once 10/15/2022 1736 10/10/2022 1833       Subjective: Patient seen and examined at the bedside today.  Lying in bed.  Very weak, confused.  Daughter-in-law at bedside.  Not in apparent distress.  Objective: Vitals:   10/28/22 0555 10/28/22 0559 10/28/22 1417 10/28/22 2104  BP: Marland Kitchen)  159/90 (!) 159/90 (!) 156/87 119/81  Pulse: (!) 115 (!) 110 (!) 110 92  Resp:  18 18 16   Temp:  98.7 F (37.1 C) 97.9 F (36.6 C) 98.5 F (36.9 C)  TempSrc:  Oral Oral Axillary  SpO2:  98%  92%  Weight:      Height:        Intake/Output Summary (Last 24 hours) at 10/29/2022 0905 Last data filed at 10/29/2022 0500 Gross per 24 hour  Intake 165.97 ml  Output 350 ml  Net -184.03 ml   Filed Weights   10/26/22 0529 10/27/22 0413 10/28/22 0500  Weight:  55.9 kg 56.4 kg 53.4 kg    Examination:  General exam: Overall comfortable, not in distress Respiratory system: diminished sounds bilaterally, no wheezes or crackles  Cardiovascular system: S1 & S2 heard, RRR.  Gastrointestinal system: Abdomen is mildly distended, soft and nontender. Central nervous system: Not alert or  oriented Extremities: No edema, no clubbing ,no cyanosis Skin: No rashes, no ulcers,no icterus   GU: foley   Data Reviewed: I have personally reviewed following labs and imaging studies  CBC: Recent Labs  Lab 10/22/2022 0632 10/24/22 0541  WBC 6.9 7.8  HGB 11.3* 10.9*  HCT 32.7* 32.5*  MCV 88.4 89.0  PLT 318 161   Basic Metabolic Panel: Recent Labs  Lab 10/20/2022 0632 10/24/22 0541 10/25/22 0652 10/26/22 0836 10/27/22 0751 10/28/22 1246  NA 132* 135 135 137 139 141  K 3.5 3.2* 2.9* 2.8* 3.2* 3.6  CL 107 105 104 106 106 107  CO2 19* 21* 21* 22 21* 24  GLUCOSE 114* 111* 106* 134* 124* 98  BUN <5* <5* 5* 11 14 16   CREATININE 0.51 0.50 0.42* 0.62 0.59 0.60  CALCIUM 7.6* 7.9* 8.1* 8.1* 8.4* 8.2*  MG 1.7 1.7 1.5* 2.2  --   --   PHOS  --   --  2.6  --   --   --      Recent Results (from the past 240 hour(s))  Blood Culture (routine x 2)     Status: None   Collection Time: 10/10/2022 12:33 PM   Specimen: BLOOD  Result Value Ref Range Status   Specimen Description   Final    BLOOD RIGHT ANTECUBITAL Performed at [facility], [place] [address] [place], [place] [address]    Special Requests   Final    BOTTLES DRAWN AEROBIC AND ANAEROBIC Blood Culture results may not be optimal due to an excessive volume of blood received in culture bottles Performed at [place] [address], [place], [place] [address]    Culture   Final    NO GROWTH 5 DAYS Performed at [facility], [place] [address] [place], [place] [address]    Report Status 10/24/2022 FINAL  Final  Urine Culture     Status: None   Collection Time:  10/20/2022 12:33 PM   Specimen: In/Out Cath Urine  Result Value Ref Range Status   Specimen Description   Final    IN/OUT CATH URINE Performed at [place] [address], [place], [place] [address]    Special Requests   Final    NONE Performed at [facility], [place] [address], [place], [place] [address]    Culture   Final    NO GROWTH Performed at [facility], [place] [address], [place],  [address]    Report Status 10/20/2022 FINAL  Final  Resp panel by RT-PCR (RSV, Flu A&B, Covid) Anterior Nasal Swab     Status: None   Collection Time: 2022-11-13  2:50 PM   Specimen: Anterior Nasal Swab  Result Value Ref Range Status   SARS Coronavirus 2 by RT PCR NEGATIVE NEGATIVE Final    Comment: (NOTE) SARS-CoV-2 target nucleic acids are NOT DETECTED.  The SARS-CoV-2 RNA is generally detectable in upper respiratory specimens during the acute phase of infection. The lowest concentration of SARS-CoV-2 viral copies this assay can detect is 138 copies/mL. A negative result does not preclude SARS-Cov-2 infection and should not be used as the sole basis for treatment or other patient management decisions. A negative result may occur with  improper specimen collection/handling, submission of specimen other than nasopharyngeal swab, presence of viral mutation(s) within the areas targeted by this assay, and inadequate number of viral copies(<138 copies/mL). A negative result must be combined with clinical observations, patient history, and epidemiological information. The expected result is Negative.  Fact Sheet for Patients:  BloggerCourse.com  Fact Sheet for Healthcare Providers:  SeriousBroker.it  This test is no t yet approved or cleared by the [place] FDA and  has been authorized for detection and/or diagnosis of SARS-CoV-2 by FDA under an Emergency Use Authorization (EUA). This EUA  will remain  in effect (meaning this test can be used) for the duration of the COVID-19 declaration under Section 564(b)(1) of the Act, 21 U.S.C.section 360bbb-3(b)(1), unless the authorization is terminated  or revoked sooner.       Influenza A by PCR NEGATIVE NEGATIVE Final   Influenza B by PCR NEGATIVE NEGATIVE Final    Comment: (NOTE) The Xpert Xpress SARS-CoV-2/FLU/RSV plus assay is intended as an aid in the diagnosis of influenza from Nasopharyngeal swab specimens and should not be used as a sole basis for treatment. Nasal washings and aspirates are unacceptable for Xpert Xpress SARS-CoV-2/FLU/RSV testing.  Fact Sheet for Patients: BloggerCourse.com  Fact Sheet for Healthcare Providers: SeriousBroker.it  This test is not yet approved or cleared by the [place] FDA and has been authorized for detection and/or diagnosis of SARS-CoV-2 by FDA under an Emergency Use Authorization (EUA). This EUA will remain in effect (meaning this test can be used) for the duration of the COVID-19 declaration under Section 564(b)(1) of the Act, 21 U.S.C. section 360bbb-3(b)(1), unless the authorization is terminated or revoked.     Resp Syncytial Virus by PCR NEGATIVE NEGATIVE Final    Comment: (NOTE) Fact Sheet for Patients: BloggerCourse.com  Fact Sheet for Healthcare Providers: SeriousBroker.it  This test is not yet approved or cleared by the [place] FDA and has been authorized for detection and/or diagnosis of SARS-CoV-2 by FDA under an Emergency Use Authorization (EUA). This EUA will remain in effect (meaning this test can be used) for the duration of the COVID-19 declaration under Section 564(b)(1) of the Act, 21 U.S.C. section 360bbb-3(b)(1), unless the authorization is terminated or revoked.  Performed at [facility], [address], [place], [place] [address]   Fungus Culture With Stain     Status: None (Preliminary result)   Collection Time: 10/14/2022  4:27 PM   Specimen: PATH Bone biopsy; Tissue  Result Value Ref Range Status   Fungus Stain Final report  Final    Comment: (NOTE) Performed At: [facility] [address] [place], [place] [id number] Jolene Schimke MD OE:[id number]    Fungus (Mycology) Culture PENDING  Incomplete   Fungal Source LEFT 5TH METATARSAL TOE  Final    Comment: Performed at [facility], [address], [place], [place] [address]  Aerobic/Anaerobic Culture w Gram Stain (surgical/deep wound)     Status: None   Collection Time: 10/29/2022  4:27 PM   Specimen: PATH Bone biopsy; Tissue  Result Value Ref Range Status   Specimen Description   Final    TOE LEFT 5TH METATARSAL Performed at [facility], [address] [place], [place] [address]    Special Requests   Final    NONE Performed at [facility], [address], [place], [place] [address]    Gram Stain NO ORGANISMS SEEN  Final   Culture   Final    No growth aerobically or anaerobically. Performed at [facility], [address], [place], [place] [address]    Report Status 10/28/2022 FINAL  Final  Fungus Culture Result     Status: None   Collection Time: 10/10/2022  4:27 PM  Result Value Ref Range Status   Result 1 Comment  Final    Comment: (NOTE) KOH/Calcofluor preparation:  no fungus observed. Performed At: [facility] [address] [place], [place] [id number] Jolene Schimke MD DX:[id number]      Radiology Studies: No results found.  Scheduled Meds:  carBAMazepine  200 mg Per Tube TID   cycloSPORINE  1 drop Both Eyes BID   leptospermum manuka honey  1 Application Topical Daily   levothyroxine  75 mcg Per Tube Q0600   Continuous Infusions:  morphine 1 mg/hr (10/28/22 2100)     LOS: 9 days   Burnadette Pop, MD [facility] P1/24/2024, 9:05 AM

## 2022-10-29 NOTE — Plan of Care (Signed)
Patient oriented to self only, baseline confusion and quiet volume. Pt remains curled up in bed and doesn't move much. VSS throughout shift, afebrile. Pt has foley present per orders.  Only one BM this shift.  Ativan given for restlessness. Bed alarm activated. POC maintained, will continue to monitor.    Problem: Education: Goal: Knowledge of General Education information will improve Description: Including pain rating scale, medication(s)/side effects and non-pharmacologic comfort measures Outcome: Progressing   Problem: Health Behavior/Discharge Planning: Goal: Ability to manage health-related needs will improve Outcome: Progressing   Problem: Clinical Measurements: Goal: Ability to maintain clinical measurements within normal limits will improve Outcome: Progressing Goal: Will remain free from infection Outcome: Progressing Goal: Diagnostic test results will improve Outcome: Progressing Goal: Respiratory complications will improve Outcome: Progressing Goal: Cardiovascular complication will be avoided Outcome: Progressing   Problem: Activity: Goal: Risk for activity intolerance will decrease Outcome: Progressing   Problem: Nutrition: Goal: Adequate nutrition will be maintained Outcome: Progressing   Problem: Coping: Goal: Level of anxiety will decrease Outcome: Progressing   Problem: Elimination: Goal: Will not experience complications related to bowel motility Outcome: Progressing Goal: Will not experience complications related to urinary retention Outcome: Progressing   Problem: Pain Managment: Goal: General experience of comfort will improve Outcome: Progressing   Problem: Safety: Goal: Ability to remain free from injury will improve Outcome: Progressing   Problem: Skin Integrity: Goal: Risk for impaired skin integrity will decrease Outcome: Progressing   Problem: Safety: Goal: Non-violent Restraint(s) Outcome: Progressing   Problem: Education: Goal:  Knowledge of the prescribed therapeutic regimen will improve Outcome: Progressing   Problem: Coping: Goal: Ability to identify and develop effective coping behavior will improve Outcome: Progressing   Problem: Clinical Measurements: Goal: Quality of life will improve Outcome: Progressing   Problem: Respiratory: Goal: Verbalizations of increased ease of respirations will increase Outcome: Progressing   Problem: Role Relationship: Goal: Family's ability to cope with current situation will improve Outcome: Progressing Goal: Ability to verbalize concerns, feelings, and thoughts to partner or family member will improve Outcome: Progressing   Problem: Pain Management: Goal: Satisfaction with pain management regimen will improve Outcome: Progressing

## 2022-10-29 NOTE — Progress Notes (Signed)
Nutrition Brief Note  Chart reviewed. Pt now transitioning to comfort care.  No further nutrition interventions planned at this time.   [name], MS, RD, LDN Inpatient Clinical Dietitian Contact information available via Amion   

## 2022-10-29 NOTE — Progress Notes (Signed)
Chaplain followed up with Debra's son, [name], and his wife following the palliative goals of care conversation yesterday.  They are coping as well as can be expected and feel that the conversation with palliative was helpful.  They are at peace with the decision to focus on her comfort and had no other concerns at this time.  [address] [place] Pager, [phone]

## 2022-10-29 NOTE — TOC Progression Note (Signed)
Transition of Care [facility]) - Progression Note    Patient Details  Name: Melanie Richardson MRN: [id number] Date of Birth: 05-21-1951  Transition of Care [facility]) CM/SW [place], RN Phone [phone]  10/29/2022, 10:59 AM  Clinical Narrative:    TOC acknowledges consult for residential hospice. CM spoke with son Melanie Richardson to offer choice for Hospice agency. Son is open to Hospice referral for determination of disposition plan. Hospice referral called to [facility] with [place]. Son would prefer residential hospice in [place]. TOC will continue to follow for disposition planning.          Expected Discharge Plan and Services                                               Social Determinants of Health (SDOH) Interventions SDOH Screenings   Tobacco Use: High Risk (10/24/2022)    Readmission Risk Interventions     No data to display

## 2022-10-30 DIAGNOSIS — E440 Moderate protein-calorie malnutrition: Secondary | ICD-10-CM | POA: Diagnosis not present

## 2022-10-30 DIAGNOSIS — G9341 Metabolic encephalopathy: Secondary | ICD-10-CM | POA: Diagnosis not present

## 2022-10-30 DIAGNOSIS — G35 Multiple sclerosis: Secondary | ICD-10-CM | POA: Diagnosis not present

## 2022-10-30 DIAGNOSIS — L03032 Cellulitis of left toe: Secondary | ICD-10-CM | POA: Diagnosis not present

## 2022-10-30 MED ORDER — LORAZEPAM 2 MG/ML IJ SOLN
1.0000 mg | Freq: Four times a day (QID) | INTRAMUSCULAR | Status: DC
  Administered 2022-10-31 (×3): 1 mg via INTRAVENOUS
  Filled 2022-10-30 (×3): qty 1

## 2022-10-30 NOTE — Discharge Summary (Addendum)
Physician Discharge Summary  Melanie Richardson XHB:[id number] DOB: 11/06/1950 DOA: 10/30/2022  PCP: Patient, No Pcp Per  Admit date: 10/12/2022 Discharge date: 10/31/2022  Admitted From: Home Disposition:residential hospice  Discharge Condition:Comfort care CODE STATUS:DNR Diet recommendation: Regular  Brief/Interim Summary:  Patient is a 72 year old female with history of cognitive impairment, multiple sclerosis, hypertension, hypothyroidism, wheelchair-bound status presented from assisted living facility with confusion, disorientation.  Found to have new ulcer on the foot on the left.  Started on antibiotics, culture sent.  Patient remained encephalopathic.  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  Patient had poor nutritional status, unable to eat.  Palliative care consulted.  After discussion of goals of care with family, she has been made comfort care.Plan for transfer to residential hospice   Following problems were addressed during the hospitalization:  Metabolic encephalopathy: History of dementia/cognitive dysfunction.  Had persistent confusion during his hospitalization, for oral intake.  Made comfort care after discussion with palliative care and family. Plan for   transfer her to residential hospice   Sepsis secondary to left facility/fifth metatarsal osteomyelitis: Underwent partial fifth ray amputation of left foot.  Currently on comfort care   Other medical problems: Multiple sclerosis, hypothyroidism, stage II chronic pressure ulcer.   Discharge Diagnoses:  Principal Problem:   Acute metabolic encephalopathy Active Problems:   MS (multiple sclerosis) (HCC)   Hypothyroidism   Weakness   Hypertension   Cellulitis of fifth toe of left foot   Sacral decubitus ulcer   Sepsis (HCC)   Hypomagnesemia   Malnutrition of moderate degree    Discharge Instructions  Discharge Instructions     Diet general   Complete  by: As directed    No wound care   Complete by: As directed       Allergies as of 10/31/2022       Reactions   Ciprofloxacin Other (See Comments)   Dizziness and headache   Codeine Other (See Comments)   GI upset   Erythromycin Other (See Comments)   Unknown        Medication List     STOP taking these medications    albuterol 108 (90 Base) MCG/ACT inhaler Commonly known as: VENTOLIN HFA   aspirin EC 81 MG tablet   atorvastatin 10 MG tablet Commonly known as: LIPITOR   busPIRone 10 MG tablet Commonly known as: BUSPAR   carbamazepine 200 MG tablet Commonly known as: TEGRETOL   feeding supplement Liqd   fluticasone 50 MCG/ACT nasal spray Commonly known as: FLONASE   HYDROcodone-acetaminophen 7.5-325 MG tablet Commonly known as: NORCO   hydrOXYzine 25 MG tablet Commonly known as: ATARAX   ibuprofen 400 MG tablet Commonly known as: ADVIL   levothyroxine 75 MCG tablet Commonly known as: SYNTHROID   loratadine 10 MG tablet Commonly known as: CLARITIN   metoprolol succinate 25 MG 24 hr tablet Commonly known as: TOPROL-XL   polyvinyl alcohol 1.4 % ophthalmic solution Commonly known as: LIQUIFILM TEARS   Vumerity 231 MG Cpdr Generic drug: Diroximel Fumarate        Allergies  Allergen Reactions   Ciprofloxacin Other (See Comments)    Dizziness and headache   Codeine Other (See Comments)    GI upset   Erythromycin Other (See Comments)    Unknown    Consultations: Palliative care   Procedures/Studies: DG Abd 1 View  Result Date: 10/25/2022 CLINICAL DATA:  Feeding tube placement. EXAM: ABDOMEN - 1 VIEW COMPARISON:  Abdominal radiograph dated 10/24/2022. FINDINGS: Feeding tube with weighted tip in the left hemiabdomen, likely in the body of the stomach. IMPRESSION: Feeding tube likely in the body of the stomach. Electronically Signed   By: Anner Crete M.D.   On: 10/25/2022 02:43   DG Abd 1 View  Result Date: 10/24/2022 CLINICAL DATA:   Feeding tube placement. EXAM: ABDOMEN - 1 VIEW COMPARISON:  CT 10/30/2022 FINDINGS: An enteric tube is present with tip coiled in the left upper quadrant consistent with location in the body of the stomach. Scattered gas-filled upper abdominal small and large bowel without distention. Lung bases are clear. IMPRESSION: Feeding tube tip projects over the left upper quadrant consistent with location in the body of the stomach. Electronically Signed   By: Lucienne Capers M.D.   On: 10/24/2022 17:43   CT FOOT LEFT W CONTRAST  Result Date: 10/21/2022 CLINICAL DATA:  Osteomyelitis suspected. Foot infection, cellulitis, drainage. EXAM: CT OF THE LOWER LEFT EXTREMITY WITH CONTRAST TECHNIQUE: Multidetector CT imaging of the lower left extremity was performed according to the standard protocol following intravenous contrast administration. RADIATION DOSE REDUCTION: This exam was performed according to the departmental dose-optimization program which includes automated exposure control, adjustment of the mA and/or kV according to patient size and/or use of iterative reconstruction technique. CONTRAST:  136mL OMNIPAQUE IOHEXOL 300 MG/ML  SOLN COMPARISON:  Plain films 10/14/2022 FINDINGS: Bones/Joint/Cartilage Bone destruction in the 5th metatarsal head concerning for osteomyelitis. No fracture, subluxation or dislocation. Ligaments Suboptimally assessed by CT. Muscles and Tendons Grossly unremarkable Soft tissues Soft tissue swelling and soft tissue defect/ulceration noted over the 5th metatarsal head. IMPRESSION: Soft tissue ulceration laterally in the foot overlying the 5th metatarsal head where there is cortical/bony destruction concerning for osteomyelitis. Electronically Signed   By: Rolm Baptise M.D.   On: 10/21/2022 21:20   VAS [place] LOWER EXTREMITY VENOUS (DVT) (ONLY [facility] & [facility])  Result Date: 10/20/2022  Lower Venous DVT Study Patient Name:  Melanie Richardson  Date of Exam:   10/06/2022 Medical Rec #: [id number]          Accession #:    [id number] Date of Birth: [birth year]          Patient Gender: F Patient Age:   35 years Exam Location:  [facility] Procedure:      VAS [place] LOWER EXTREMITY VENOUS (DVT) Referring Phys: [facility] GOWENS --------------------------------------------------------------------------------  Indications: Swelling, and Erythema.  Limitations: Quality of study limited due to patient's innability to cooperate (movement and poor positioning) due to altered mental status. Comparison Study: No previous exams Performing Technologist: Jody Hill RVT, RDMS  Examination Guidelines: A complete evaluation includes B-mode imaging, spectral Doppler, color Doppler, and power Doppler as needed of all accessible portions of each vessel. Bilateral testing is considered an integral part of a complete examination. Limited examinations for reoccurring indications may be performed as noted. The reflux portion of the exam is performed with the patient in reverse Trendelenburg.  +---------+---------------+---------+-----------+----------+--------------+ RIGHT    CompressibilityPhasicitySpontaneityPropertiesThrombus Aging +---------+---------------+---------+-----------+----------+--------------+ CFV      Full           Yes      Yes                                 +---------+---------------+---------+-----------+----------+--------------+ SFJ      Full                                                        +---------+---------------+---------+-----------+----------+--------------+  FV Prox  Full           Yes      Yes                                 +---------+---------------+---------+-----------+----------+--------------+ FV Mid   Full           Yes      Yes                                 +---------+---------------+---------+-----------+----------+--------------+ FV DistalFull           Yes      Yes                                  +---------+---------------+---------+-----------+----------+--------------+ PFV      Full                                                        +---------+---------------+---------+-----------+----------+--------------+ POP      Full           Yes      Yes                                 +---------+---------------+---------+-----------+----------+--------------+ PTV      Full                                                        +---------+---------------+---------+-----------+----------+--------------+ PERO     Full                                                        +---------+---------------+---------+-----------+----------+--------------+   +---------+---------------+---------+-----------+----------+--------------+ LEFT     CompressibilityPhasicitySpontaneityPropertiesThrombus Aging +---------+---------------+---------+-----------+----------+--------------+ CFV      Full           Yes      Yes                                 +---------+---------------+---------+-----------+----------+--------------+ SFJ      Full                                                        +---------+---------------+---------+-----------+----------+--------------+ FV Prox  Full           Yes      Yes                                 +---------+---------------+---------+-----------+----------+--------------+ FV Mid   Full  Yes      Yes                                 +---------+---------------+---------+-----------+----------+--------------+ FV DistalFull           Yes      Yes                                 +---------+---------------+---------+-----------+----------+--------------+ PFV      Full                                                        +---------+---------------+---------+-----------+----------+--------------+ POP      Full           Yes      Yes                                  +---------+---------------+---------+-----------+----------+--------------+ PTV                                                   not visualized +---------+---------------+---------+-----------+----------+--------------+ PERO                                                  not visualized +---------+---------------+---------+-----------+----------+--------------+   Left Technical Findings: Not visualized segments include Peroneal and posterior tibial veins.   Summary: BILATERAL: -No evidence of popliteal cyst, bilaterally. RIGHT: - There is no evidence of deep vein thrombosis in the lower extremity.  - Ultrasound characteristics of enlarged lymph nodes are noted in the groin.  LEFT: - There is no evidence of deep vein thrombosis in the lower extremity. However, portions of this examination were limited- see technologist comments above.  - Ultrasound characteristics of enlarged lymph nodes noted in the groin.  *See table(s) above for measurements and observations. Electronically signed by Gerarda Fraction on 10/20/2022 at 6:46:01 PM.    Final    CT ABDOMEN PELVIS W CONTRAST  Result Date: 10/18/2022 CLINICAL DATA:  Sacral wound EXAM: CT ABDOMEN AND PELVIS WITH CONTRAST TECHNIQUE: Multidetector CT imaging of the abdomen and pelvis was performed using the standard protocol following bolus administration of intravenous contrast. RADIATION DOSE REDUCTION: This exam was performed according to the departmental dose-optimization program which includes automated exposure control, adjustment of the mA and/or kV according to patient size and/or use of iterative reconstruction technique. CONTRAST:  OMNIPAQUE IOHEXOL 300 MG/ML  SOLN COMPARISON:  None Available. FINDINGS: Lower chest: No acute abnormality Hepatobiliary: No focal hepatic abnormality. Gallbladder unremarkable. Pancreas: No focal abnormality or ductal dilatation. Spleen: No focal abnormality.  Normal size. Adrenals/Urinary Tract: No adrenal  abnormality. No focal renal abnormality. No stones or hydronephrosis. Urinary bladder is unremarkable. Stomach/Bowel: Stomach, large and small bowel grossly unremarkable. Vascular/Lymphatic: Aortic atherosclerosis. No evidence of aneurysm or adenopathy. Reproductive: Uterus and adnexa unremarkable.  No mass. Other: No free fluid or free air.  Musculoskeletal: Postoperative changes in the lower lumbar spine. No acute bony abnormality. No bone destruction in the sacrum/coccyx to suggest osteomyelitis. Subcutaneous calcifications within the gluteal regions bilaterally, presumably related to chronic pressure or injection granulomas. No focal fluid collections to suggest abscess. IMPRESSION: No evidence of focal fluid collection or sacral/coccygeal osteo myelitis. No acute findings in the abdomen or pelvis. Electronically Signed   By: Charlett Nose M.D.   On: 10/18/2022 17:31   CT Head Wo Contrast  Result Date: 10/11/2022 CLINICAL DATA:  Mental status change, unknown cause EXAM: CT HEAD WITHOUT CONTRAST TECHNIQUE: Contiguous axial images were obtained from the base of the skull through the vertex without intravenous contrast. RADIATION DOSE REDUCTION: This exam was performed according to the departmental dose-optimization program which includes automated exposure control, adjustment of the mA and/or kV according to patient size and/or use of iterative reconstruction technique. COMPARISON:  02/14/2019 FINDINGS: Brain: There is atrophy and chronic small vessel disease changes. No acute intracranial abnormality. Specifically, no hemorrhage, hydrocephalus, mass lesion, acute infarction, or significant intracranial injury. Vascular: No hyperdense vessel or unexpected calcification. Skull: No acute calvarial abnormality. Sinuses/Orbits: No acute findings Other: None IMPRESSION: Atrophy, chronic microvascular disease. No acute intracranial abnormality. Electronically Signed   By: Charlett Nose M.D.   On: 11/05/2022 17:27    DG Foot Complete Left  Result Date: 11/05/2022 CLINICAL DATA:  ulceration left little toe EXAM: LEFT FOOT - COMPLETE 3+ VIEW COMPARISON:  None Available. FINDINGS: Evaluation is limited by osteopenia. Query cortical offset of the head of the fifth metatarsal. Otherwise no definitive acute fracture or dislocation. Degenerative changes throughout the toes. No area of erosion or osseous destruction. No unexpected radiopaque foreign body. Soft tissue edema. Vascular calcifications. IMPRESSION: 1. Query cortical offset of the head of the fifth metatarsal. This may reflect a minimally displaced fracture. Recommend correlation with point tenderness. 2.  No definitive radiographic evidence of osteomyelitis. Electronically Signed   By: Meda Klinefelter M.D.   On: 10/18/2022 13:23   DG Chest Port 1 View  Result Date: 10/17/2022 CLINICAL DATA:  Questionable sepsis - evaluate for abnormality EXAM: PORTABLE CHEST 1 VIEW COMPARISON:  Feb 10, 2017 FINDINGS: Evaluation is limited by patient positioning. Incomplete assessment of the LEFT apex laterally. The cardiomediastinal silhouette is grossly similar in contour given patient rotation. No pleural effusion. No pneumothorax. No acute pleuroparenchymal abnormality. Bilateral soft tissue calcifications of the arms IMPRESSION: 1. No acute cardiopulmonary abnormality within the limitations of this rotated exam. If there is clinical concern for mediastinal pathology, recommend repeat PA and lateral chest radiograph. 2. Bilateral tissue calcifications of the arms, possibly sequela of prior trauma. Recommend correlation with physical exam. Electronically Signed   By: Meda Klinefelter M.D.   On: 10/11/2022 13:18      Subjective: Patient seen and examined at bedside today.  On full comfort care, appears comfortable, not in any Distress.  Confused,not alert  but awake  Discharge Exam: Vitals:   10/29/22 2100 10/31/22 0507  BP: 110/70 106/72  Pulse: (!) 107 (!) 121   Resp: 15 18  Temp: 99.4 F (37.4 C) (!) 100.8 F (38.2 C)  SpO2: 99% 92%   Vitals:   10/28/22 1417 10/28/22 2104 10/29/22 2100 10/31/22 0507  BP: (!) 156/87 119/81 110/70 106/72  Pulse: (!) 110 92 (!) 107 (!) 121  Resp: 18 16 15 18   Temp: 97.9 F (36.6 C) 98.5 F (36.9 C) 99.4 F (37.4 C) (!) 100.8 F (38.2 C)  TempSrc: Oral Axillary  Axillary Oral  SpO2:  92% 99% 92%  Weight:      Height:        General: Pt is  awake, not in acute distress,confused Cardiovascular: RRR, S1/S2 +, no rubs, no gallops Respiratory: CTA bilaterally, no wheezing, no rhonchi Abdominal: Soft, NT, ND, bowel sounds + Extremities: no edema, no cyanosis GU: foley    The results of significant diagnostics from this hospitalization (including imaging, microbiology, ancillary and laboratory) are listed below for reference.     Microbiology: Recent Results (from the past 240 hour(s))  Fungus Culture With Stain     Status: None (Preliminary result)   Collection Time: 10/14/2022  4:27 PM   Specimen: PATH Bone biopsy; Tissue  Result Value Ref Range Status   Fungus Stain Final report  Final    Comment: (NOTE) Performed At: [facility] [place], [place] [id number] Rush Farmer MD IO:[id number]    Fungus (Mycology) Culture PENDING  Incomplete   Fungal Source LEFT 5TH METATARSAL TOE  Final    Comment: Performed at [facility], [place] [address], [place], [place] [address]  Aerobic/Anaerobic Culture w Gram Stain (surgical/deep wound)     Status: None   Collection Time: 10/25/2022  4:27 PM   Specimen: PATH Bone biopsy; Tissue  Result Value Ref Range Status   Specimen Description   Final    TOE LEFT 5TH METATARSAL Performed at [facility] [address], [place], [place] [address]    Special Requests   Final    NONE Performed at [facility], [place] [address], [place], [place] [address]    Gram Stain NO ORGANISMS SEEN   Final   Culture   Final    No growth aerobically or anaerobically. Performed at [facility], [place] [address] [place], [place] [address]    Report Status 10/28/2022 FINAL  Final  Fungus Culture Result     Status: None   Collection Time: 10/06/2022  4:27 PM  Result Value Ref Range Status   Result 1 Comment  Final    Comment: (NOTE) KOH/Calcofluor preparation:  no fungus observed. Performed At: [facility] [place], [place] [id number] Rush Farmer MD QX:[id number]      Labs: BNP (last 3 results) No results for input(s): "BNP" in the last 8760 hours. Basic Metabolic Panel: Recent Labs  Lab 10/25/22 0652 10/26/22 0836 10/27/22 0751 10/28/22 1246  NA 135 137 139 141  K 2.9* 2.8* 3.2* 3.6  CL 104 106 106 107  CO2 21* 22 21* 24  GLUCOSE 106* 134* 124* 98  BUN 5* 11 14 16   CREATININE 0.42* 0.62 0.59 0.60  CALCIUM 8.1* 8.1* 8.4* 8.2*  MG 1.5* 2.2  --   --   PHOS 2.6  --   --   --    Liver Function Tests: No results for input(s): "AST", "ALT", "ALKPHOS", "BILITOT", "PROT", "ALBUMIN" in the last 168 hours. No results for input(s): "LIPASE", "AMYLASE" in the last 168 hours. No results for input(s): "AMMONIA" in the last 168 hours. CBC: No results for input(s): "WBC", "NEUTROABS", "HGB", "HCT", "MCV", "PLT" in the last 168 hours.  Cardiac Enzymes: No results for input(s): "CKTOTAL", "CKMB", "CKMBINDEX", "TROPONINI" in the last 168 hours. BNP: Invalid input(s): "POCBNP" CBG: Recent Labs  Lab 10/28/22 0008 10/28/22 0450 10/28/22 0949 10/28/22 1231 10/28/22 1553  GLUCAP 105* 137* 100* 103* 116*   D-Dimer No results for input(s): "DDIMER" in the last 72  hours. Hgb A1c No results for input(s): "HGBA1C" in the last 72 hours. Lipid Profile No results for input(s): "CHOL", "HDL", "LDLCALC", "TRIG", "CHOLHDL", "LDLDIRECT" in the last 72 hours. Thyroid function studies No results for input(s): "TSH", "T4TOTAL", "T3FREE", "THYROIDAB" in the last  72 hours.  Invalid input(s): "FREET3" Anemia work up No results for input(s): "VITAMINB12", "FOLATE", "FERRITIN", "TIBC", "IRON", "RETICCTPCT" in the last 72 hours. Urinalysis    Component Value Date/Time   COLORURINE YELLOW 10/13/2022 1233   APPEARANCEUR CLEAR 10/30/2022 1233   LABSPEC 1.017 10/15/2022 1233   PHURINE 6.0 10/24/2022 1233   GLUCOSEU NEGATIVE 10/14/2022 1233   HGBUR NEGATIVE 10/18/2022 1233   BILIRUBINUR NEGATIVE 10/18/2022 1233   KETONESUR 5 (A) 10/25/2022 1233   PROTEINUR NEGATIVE 10/20/2022 1233   UROBILINOGEN 0.2 09/15/2013 1605   NITRITE NEGATIVE 10/26/2022 1233   LEUKOCYTESUR NEGATIVE 10/15/2022 1233   Sepsis Labs No results for input(s): "WBC" in the last 168 hours.  Invalid input(s): "PROCALCITONIN", "LACTICIDVEN"  Microbiology Recent Results (from the past 240 hour(s))  Fungus Culture With Stain     Status: None (Preliminary result)   Collection Time: November 13, 2022  4:27 PM   Specimen: PATH Bone biopsy; Tissue  Result Value Ref Range Status   Fungus Stain Final report  Final    Comment: (NOTE) Performed At: [facility] [address] [place], [place] [id number] Jolene Schimke MD EV:[id number]    Fungus (Mycology) Culture PENDING  Incomplete   Fungal Source LEFT 5TH METATARSAL TOE  Final    Comment: Performed at [facility], [address], [place], [place] [address]  Aerobic/Anaerobic Culture w Gram Stain (surgical/deep wound)     Status: None   Collection Time: 13-Nov-2022  4:27 PM   Specimen: PATH Bone biopsy; Tissue  Result Value Ref Range Status   Specimen Description   Final    TOE LEFT 5TH METATARSAL Performed at [facility], [address] [place], [place] [address]    Special Requests   Final    NONE Performed at [facility], [address], [place], [place] [address]    Gram Stain NO ORGANISMS SEEN  Final   Culture   Final    No growth aerobically or  anaerobically. Performed at [facility], [address], [place], [place] [address]    Report Status 10/28/2022 FINAL  Final  Fungus Culture Result     Status: None   Collection Time: November 13, 2022  4:27 PM  Result Value Ref Range Status   Result 1 Comment  Final    Comment: (NOTE) KOH/Calcofluor preparation:  no fungus observed. Performed At: [facility] [address] [place], [place] [id number] Jolene Schimke MD EU:[id number]     Please note: You were cared for by a hospitalist during your hospital stay. Once you are discharged, your primary care physician will handle any further medical issues. Please note that NO REFILLS for any discharge medications will be authorized once you are discharged, as it is imperative that you return to your primary care physician (or establish a relationship with a primary care physician if you do not have one) for your post hospital discharge needs so that they can reassess your need for medications and monitor your lab values.    Time coordinating discharge: 40 minutes  SIGNED:   Burnadette Pop, MD  [facility] 10/31/2022, 7:56 AM Pager [phone]  If 7PM-7AM, please contact night-coverage [email or web address] Password [email or web address]

## 2022-10-30 NOTE — Progress Notes (Signed)
Patient seen at request of family. Approved by primary. Chart reviewed.  Will fill out FMLA for son. Condolences offered.  Erskine Emery MD PCCM

## 2022-10-30 NOTE — TOC Progression Note (Signed)
Transition of Care [facility]) - Progression Note    Patient Details  Name: Melanie Richardson MRN: [id number] Date of Birth: January 08, 1951  Transition of Care [facility]) CM/SW [place], RN Phone [phone]  10/30/2022, 6:37 PM  Clinical Narrative:    CM spoke with Cherie at [facility]. Per [place] is following and in the process of getting patient approved for residential hospice. TOC will continue to follow.        Expected Discharge Plan and Services         Expected Discharge Date: 10/30/22                                     Social Determinants of Health (SDOH) Interventions SDOH Screenings   Tobacco Use: High Risk (10/24/2022)    Readmission Risk Interventions     No data to display

## 2022-10-30 NOTE — Care Management Important Message (Signed)
Important Message  Patient Details Hospice Name: Melanie Richardson MRN: [id number] Date of Birth: 17-Mar-1951   Medicare Important Message Given:  No     Kerin Salen 10/30/2022, 11:05 AM

## 2022-10-30 NOTE — Progress Notes (Signed)
Palliative Medicine Progress Note   Patient Name: Melanie Richardson       Date: 10/30/2022 DOB: [birth year]  Age: 72 y.o. MRN#: [id number] Attending Physician: Shelly Coss, MD Primary Care Physician: Patient, No Pcp Per Admit Date: 10/09/2022    HPI/Patient Profile: 72 y.o. female  with past medical history of cognitive impairment, multiple sclerosis, hypertension, hypothyroidism, and wheelchair-bound status admitted on 11/04/2022 with AMS. AMS felt to be r/t L foot cellulitis and fifth metatarsal osteomyelitis. Patient underwent surgical procedure 1/18. Patient has not eaten in 4 days, SLP unable to evaluate r/t mental status. PMT consulted to discuss [place].     Subjective: Patient appears comfortable. Morphine infusion is at 2 mg/hr. She is unresponsive to voice. No non-verbal signs of pain or discomfort noted. Respirations are even and unlabored. No excessive respiratory secretions noted.  Family present at bedside. They report patient was agitated earlier today and this was very distressing. Emotional support provided. Education and counseling provided on natural trajectory at EOL.      Objective:  Physical Exam Vitals reviewed.  Constitutional:      Appearance: She is ill-appearing.  Pulmonary:     Effort: Pulmonary effort is normal.  Neurological:     Comments: Unresponsive to voice              Palliative Assessment/Data: PPS 10%     Palliative Medicine Assessment & Plan   Assessment: Principal Problem:   Acute metabolic encephalopathy Active Problems:   MS (multiple sclerosis) (HCC)   Hypothyroidism   Weakness   Hypertension   Cellulitis of fifth toe of left foot   Sacral decubitus ulcer   Sepsis (HCC)   Hypomagnesemia   Malnutrition of moderate degree     Recommendations/Plan: Full comfort measures  DNR/DNI as previously documented Morphine infusion for comfort Scheduled ativan increased to 1 mg IV every 6 hours Transfer to hosp[facility] facility [facility] when bed is available PMT will continue to follow    Additional Symptom Management:  Haloperidol (HALDOL) prn for agitation  Glycopyrrolate (ROBINUL) for excessive secretions Ondansetron (ZOFRAN) prn for nausea Polyvinyl alcohol (LIQUIFILM TEARS) prn for dry eyes Antiseptic oral rinse (BIOTENE) prn for dry mouth   Prognosis:  < 2 weeks  Discharge Planning: Hospice facility    Thank you for allowing the Palliative Medicine Team to  assist in the care of this patient.    Lavena Bullion, NP   Please contact Palliative Medicine Team phone at [phone] for questions and concerns.  For individual providers, please see AMION.

## 2022-10-30 NOTE — Plan of Care (Addendum)
Patient oriented to self only, baseline confusion and quiet volume. Pt remains curled up in bed and doesn't move much. VSS throughout shift, afebrile. Pt has foley present.  Ativan scheduled. Bed alarm activated. PIV working again, however, morphine not restarted.  Pt remains with pain score 0, calm in bed, and provider notified.  Restarted continuous morphine but noticed right hand is swollen.  Right PIVs were not in use entire shift.  Bilateral hands warm and reddish colored.  Elevated right arm.  Provider notified.  Bilateral mitts placed back on pt.  POC maintained, will continue to monitor.   Problem: Education: Goal: Knowledge of General Education information will improve Description: Including pain rating scale, medication(s)/side effects and non-pharmacologic comfort measures Outcome: Progressing   Problem: Health Behavior/Discharge Planning: Goal: Ability to manage health-related needs will improve Outcome: Progressing   Problem: Clinical Measurements: Goal: Ability to maintain clinical measurements within normal limits will improve Outcome: Progressing Goal: Will remain free from infection Outcome: Progressing Goal: Diagnostic test results will improve Outcome: Progressing Goal: Respiratory complications will improve Outcome: Progressing Goal: Cardiovascular complication will be avoided Outcome: Progressing   Problem: Activity: Goal: Risk for activity intolerance will decrease Outcome: Progressing   Problem: Nutrition: Goal: Adequate nutrition will be maintained Outcome: Progressing   Problem: Coping: Goal: Level of anxiety will decrease Outcome: Progressing   Problem: Elimination: Goal: Will not experience complications related to bowel motility Outcome: Progressing Goal: Will not experience complications related to urinary retention Outcome: Progressing   Problem: Pain Managment: Goal: General experience of comfort will improve Outcome: Progressing   Problem:  Safety: Goal: Ability to remain free from injury will improve Outcome: Progressing   Problem: Skin Integrity: Goal: Risk for impaired skin integrity will decrease Outcome: Progressing   Problem: Safety: Goal: Non-violent Restraint(s) Outcome: Progressing   Problem: Education: Goal: Knowledge of the prescribed therapeutic regimen will improve Outcome: Progressing   Problem: Coping: Goal: Ability to identify and develop effective coping behavior will improve Outcome: Progressing   Problem: Clinical Measurements: Goal: Quality of life will improve Outcome: Progressing   Problem: Respiratory: Goal: Verbalizations of increased ease of respirations will increase Outcome: Progressing   Problem: Role Relationship: Goal: Family's ability to cope with current situation will improve Outcome: Progressing Goal: Ability to verbalize concerns, feelings, and thoughts to partner or family member will improve Outcome: Progressing   Problem: Pain Management: Goal: Satisfaction with pain management regimen will improve Outcome: Progressing

## 2022-10-31 DIAGNOSIS — G9341 Metabolic encephalopathy: Secondary | ICD-10-CM | POA: Diagnosis not present

## 2022-10-31 DIAGNOSIS — E440 Moderate protein-calorie malnutrition: Secondary | ICD-10-CM | POA: Diagnosis not present

## 2022-10-31 DIAGNOSIS — G35 Multiple sclerosis: Secondary | ICD-10-CM | POA: Diagnosis not present

## 2022-10-31 DIAGNOSIS — L03032 Cellulitis of left toe: Secondary | ICD-10-CM | POA: Diagnosis not present

## 2022-10-31 NOTE — Progress Notes (Signed)
Patient seen and examined at bedside today.  She is on full comfort care.  On examination, she looked comfortable.  Son was also at the bedside and he was little concerned about movement of the upper limbs. There is no change in the medical management.  Plan is to transfer her to residential hospice.  Patient can be discharged whenever possible.  DC order/summary in place

## 2022-10-31 NOTE — Progress Notes (Signed)
Palliative Medicine Progress Note   Patient Name: Melanie Richardson       Date: 10/31/2022 DOB: [birth year]  Age: 72 y.o. MRN#: [id number] Attending Physician: Shelly Coss, MD Primary Care Physician: Patient, No Pcp Per Admit Date: 10/30/2022  Reason for Consultation/Follow-up: end of life care, symptom management  HPI/Patient Profile: 72 y.o. female  with past medical history of cognitive impairment, multiple sclerosis, hypertension, hypothyroidism, and wheelchair-bound status admitted on 10/24/2022 with AMS. AMS felt to be r/t L foot cellulitis and fifth metatarsal osteomyelitis. Patient underwent surgical procedure 1/18. Patient has not eaten in 4 days, SLP unable to evaluate r/t mental status. PMT consulted to discuss [place].      Subjective: Patient appears comfortable. Morphine infusion is at 3 mg/hr. She is unresponsive to voice. No non-verbal signs of pain or discomfort noted. Respirations are even and unlabored. No excessive respiratory secretions noted.  Family present at bedside.  Emotional support provided. Education and counseling provided on natural trajectory at EOL.     Objective:  Physical Exam Vitals reviewed.  Constitutional:      General: She is not in acute distress.    Appearance: She is ill-appearing.     Comments: Frail  Pulmonary:     Effort: No respiratory distress.  Neurological:     Mental Status: She is unresponsive.                Palliative Assessment/Data: PPS 10%     Palliative Medicine Assessment & Plan   Assessment: Principal Problem:   Acute metabolic encephalopathy Active Problems:   MS (multiple sclerosis) (HCC)   Hypothyroidism   Weakness   Hypertension   Cellulitis of fifth toe of left foot   Sacral decubitus ulcer   Sepsis (HCC)    Hypomagnesemia   Malnutrition of moderate degree    Recommendations/Plan: Full comfort measures  Morphine infusion for comfort Scheduled ativan 1 mg IV every 6 hours Transfer to hospice facility [facility] when bed is available FMLA paperwork completed for daughter-in-law Melanie Richardson   Code Status: DNR/DNI   Prognosis:  < 2 weeks  Discharge Planning: Hospice facility    Thank you for allowing the Palliative Medicine Team to assist in the care of this patient.   MDM - [place]   Lavena Bullion, NP   Please contact Palliative Medicine  Team phone at [phone] for questions and concerns.  For individual providers, please see AMION.

## 2022-10-31 NOTE — TOC Progression Note (Signed)
Transition of Care [facility]) - Progression Note    Patient Details  Name: Melanie Richardson MRN: [id number] Date of Birth: [birth year]  Transition of Care [facility]) CM/SW [place], RN Phone Number:[phone]  10/31/2022, 11:18 AM  Clinical Narrative:    Approved for Residential Hospice per Cherie at [place]. No bed available. \        Expected Discharge Plan and Services         Expected Discharge Date: 10/30/22                                     Social Determinants of Health (SDOH) Interventions SDOH Screenings   Tobacco Use: High Risk (10/24/2022)    Readmission Risk Interventions     No data to display

## 2022-10-31 NOTE — Progress Notes (Signed)
Assessment limited as family requested not to disturb pt as she appears comfortable. Family declines Q2H turns but would like turns less frequently for comfort. Pt appears comfortable at current morphine gtt rate.

## 2022-11-06 NOTE — Progress Notes (Signed)
Hospitalist notified of pt's passing at 0154. Son [name] notified. Patient placement notified.   0230 family at bedside. Pt have been cleaned, lines and drains removed.

## 2022-11-06 NOTE — Progress Notes (Signed)
    OVERNIGHT PROGRESS REPORT  Notified by RN that patient has expired at 31.  2 RN verified. Patient was comfort care.   RN will be notifying family.    Raenette Rover, DNP, [place]

## 2022-11-06 DEATH — deceased

## 2022-11-24 ENCOUNTER — Ambulatory Visit: Payer: Medicare Other | Admitting: Family Medicine

## 2022-11-24 LAB — FUNGUS CULTURE WITH STAIN

## 2022-11-24 LAB — FUNGUS CULTURE RESULT

## 2022-11-24 LAB — FUNGAL ORGANISM REFLEX

## 2022-12-05 NOTE — Discharge Summary (Signed)
Death Summary  Melanie Richardson MWU:[id number] DOB: 01/29/1951 DOA: November 01, 2022  PCP: Patient, No Pcp Per  Admit date: 10/13/2022 Date of Death: 11/14/2022 Time of Death: 0154 Notification: Patient, No Pcp Per notified of death of 11/20/22  Patient is a 72 year old female with history of cognitive impairment, multiple sclerosis, hypertension, hypothyroidism, wheelchair-bound status presented from assisted living facility with confusion, disorientation.  Found to have new ulcer on the foot on the left.  Started on antibiotics, culture sent.  Patient remained encephalopathic.  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  Patient had poor nutritional status, unable to eat.  Palliative care consulted.  After discussion of goals of care with family, she has been made comfort care.Plan for transfer to residential hospice  but she expired on 10/22/22.   The results of significant diagnostics from this hospitalization (including imaging, microbiology, ancillary and laboratory) are listed below for reference.    Significant Diagnostic Studies: DG Abd 1 View  Result Date: 10/25/2022 CLINICAL DATA:  Feeding tube placement. EXAM: ABDOMEN - 1 VIEW COMPARISON:  Abdominal radiograph dated 10/24/2022. FINDINGS: Feeding tube with weighted tip in the left hemiabdomen, likely in the body of the stomach. IMPRESSION: Feeding tube likely in the body of the stomach. Electronically Signed   By: Anner Crete M.D.   On: 10/25/2022 02:43   DG Abd 1 View  Result Date: 10/24/2022 CLINICAL DATA:  Feeding tube placement. EXAM: ABDOMEN - 1 VIEW COMPARISON:  CT November 01, 2022 FINDINGS: An enteric tube is present with tip coiled in the left upper quadrant consistent with location in the body of the stomach. Scattered gas-filled upper abdominal small and large bowel without distention. Lung bases are clear. IMPRESSION: Feeding tube tip projects over the left upper quadrant  consistent with location in the body of the stomach. Electronically Signed   By: Lucienne Capers M.D.   On: 10/24/2022 17:43   CT FOOT LEFT W CONTRAST  Result Date: 10/21/2022 CLINICAL DATA:  Osteomyelitis suspected. Foot infection, cellulitis, drainage. EXAM: CT OF THE LOWER LEFT EXTREMITY WITH CONTRAST TECHNIQUE: Multidetector CT imaging of the lower left extremity was performed according to the standard protocol following intravenous contrast administration. RADIATION DOSE REDUCTION: This exam was performed according to the departmental dose-optimization program which includes automated exposure control, adjustment of the mA and/or kV according to patient size and/or use of iterative reconstruction technique. CONTRAST:  152mL OMNIPAQUE IOHEXOL 300 MG/ML  SOLN COMPARISON:  Plain films 10/25/2022 FINDINGS: Bones/Joint/Cartilage Bone destruction in the 5th metatarsal head concerning for osteomyelitis. No fracture, subluxation or dislocation. Ligaments Suboptimally assessed by CT. Muscles and Tendons Grossly unremarkable Soft tissues Soft tissue swelling and soft tissue defect/ulceration noted over the 5th metatarsal head. IMPRESSION: Soft tissue ulceration laterally in the foot overlying the 5th metatarsal head where there is cortical/bony destruction concerning for osteomyelitis. Electronically Signed   By: Rolm Baptise M.D.   On: 10/21/2022 21:20   VAS [place] LOWER EXTREMITY VENOUS (DVT) (ONLY [facility] & [facility])  Result Date: 10/20/2022  Lower Venous DVT Study Patient Name:  Melanie Richardson  Date of Exam:   10/11/2022 Medical Rec #: [id number]         Accession #:    [id number] Date of Birth: [birth year]          Patient Gender: F Patient Age:   54 years Exam Location:  [facility] Procedure:      VAS [place] LOWER EXTREMITY VENOUS (DVT) Referring Phys: [facility]  [place] --------------------------------------------------------------------------------  Indications: Swelling, and Erythema.  Limitations: Quality of study  limited due to patient's innability to cooperate (movement and poor positioning) due to altered mental status. Comparison Study: No previous exams Performing Technologist: Jody Hill RVT, RDMS  Examination Guidelines: A complete evaluation includes B-mode imaging, spectral Doppler, color Doppler, and power Doppler as needed of all accessible portions of each vessel. Bilateral testing is considered an integral part of a complete examination. Limited examinations for reoccurring indications may be performed as noted. The reflux portion of the exam is performed with the patient in reverse Trendelenburg.  +---------+---------------+---------+-----------+----------+--------------+ RIGHT    CompressibilityPhasicitySpontaneityPropertiesThrombus Aging +---------+---------------+---------+-----------+----------+--------------+ CFV      Full           Yes      Yes                                 +---------+---------------+---------+-----------+----------+--------------+ SFJ      Full                                                        +---------+---------------+---------+-----------+----------+--------------+ FV Prox  Full           Yes      Yes                                 +---------+---------------+---------+-----------+----------+--------------+ FV Mid   Full           Yes      Yes                                 +---------+---------------+---------+-----------+----------+--------------+ FV DistalFull           Yes      Yes                                 +---------+---------------+---------+-----------+----------+--------------+ PFV      Full                                                        +---------+---------------+---------+-----------+----------+--------------+ POP      Full           Yes      Yes                                 +---------+---------------+---------+-----------+----------+--------------+ PTV      Full                                                         +---------+---------------+---------+-----------+----------+--------------+ PERO     Full                                                        +---------+---------------+---------+-----------+----------+--------------+   +---------+---------------+---------+-----------+----------+--------------+  LEFT     CompressibilityPhasicitySpontaneityPropertiesThrombus Aging +---------+---------------+---------+-----------+----------+--------------+ CFV      Full           Yes      Yes                                 +---------+---------------+---------+-----------+----------+--------------+ SFJ      Full                                                        +---------+---------------+---------+-----------+----------+--------------+ FV Prox  Full           Yes      Yes                                 +---------+---------------+---------+-----------+----------+--------------+ FV Mid   Full           Yes      Yes                                 +---------+---------------+---------+-----------+----------+--------------+ FV DistalFull           Yes      Yes                                 +---------+---------------+---------+-----------+----------+--------------+ PFV      Full                                                        +---------+---------------+---------+-----------+----------+--------------+ POP      Full           Yes      Yes                                 +---------+---------------+---------+-----------+----------+--------------+ PTV                                                   not visualized +---------+---------------+---------+-----------+----------+--------------+ PERO                                                  not visualized +---------+---------------+---------+-----------+----------+--------------+   Left Technical Findings: Not visualized segments include Peroneal and posterior tibial veins.    Summary: BILATERAL: -No evidence of popliteal cyst, bilaterally. RIGHT: - There is no evidence of deep vein thrombosis in the lower extremity.  - Ultrasound characteristics of enlarged lymph nodes are noted in the groin.  LEFT: - There is no evidence of deep vein thrombosis in the lower extremity. However, portions of this examination were limited- see technologist comments above.  - Ultrasound characteristics of enlarged  lymph nodes noted in the groin.  *See table(s) above for measurements and observations. Electronically signed by Gerarda Fraction on 10/20/2022 at 6:46:01 PM.    Final    CT ABDOMEN PELVIS W CONTRAST  Result Date: 26-Oct-2022 CLINICAL DATA:  Sacral wound EXAM: CT ABDOMEN AND PELVIS WITH CONTRAST TECHNIQUE: Multidetector CT imaging of the abdomen and pelvis was performed using the standard protocol following bolus administration of intravenous contrast. RADIATION DOSE REDUCTION: This exam was performed according to the departmental dose-optimization program which includes automated exposure control, adjustment of the mA and/or kV according to patient size and/or use of iterative reconstruction technique. CONTRAST:  OMNIPAQUE IOHEXOL 300 MG/ML  SOLN COMPARISON:  None Available. FINDINGS: Lower chest: No acute abnormality Hepatobiliary: No focal hepatic abnormality. Gallbladder unremarkable. Pancreas: No focal abnormality or ductal dilatation. Spleen: No focal abnormality.  Normal size. Adrenals/Urinary Tract: No adrenal abnormality. No focal renal abnormality. No stones or hydronephrosis. Urinary bladder is unremarkable. Stomach/Bowel: Stomach, large and small bowel grossly unremarkable. Vascular/Lymphatic: Aortic atherosclerosis. No evidence of aneurysm or adenopathy. Reproductive: Uterus and adnexa unremarkable.  No mass. Other: No free fluid or free air. Musculoskeletal: Postoperative changes in the lower lumbar spine. No acute bony abnormality. No bone destruction in the sacrum/coccyx to  suggest osteomyelitis. Subcutaneous calcifications within the gluteal regions bilaterally, presumably related to chronic pressure or injection granulomas. No focal fluid collections to suggest abscess. IMPRESSION: No evidence of focal fluid collection or sacral/coccygeal osteo myelitis. No acute findings in the abdomen or pelvis. Electronically Signed   By: Charlett Nose M.D.   On: 10-26-2022 17:31   CT Head Wo Contrast  Result Date: 2022/10/26 CLINICAL DATA:  Mental status change, unknown cause EXAM: CT HEAD WITHOUT CONTRAST TECHNIQUE: Contiguous axial images were obtained from the base of the skull through the vertex without intravenous contrast. RADIATION DOSE REDUCTION: This exam was performed according to the departmental dose-optimization program which includes automated exposure control, adjustment of the mA and/or kV according to patient size and/or use of iterative reconstruction technique. COMPARISON:  02/14/2019 FINDINGS: Brain: There is atrophy and chronic small vessel disease changes. No acute intracranial abnormality. Specifically, no hemorrhage, hydrocephalus, mass lesion, acute infarction, or significant intracranial injury. Vascular: No hyperdense vessel or unexpected calcification. Skull: No acute calvarial abnormality. Sinuses/Orbits: No acute findings Other: None IMPRESSION: Atrophy, chronic microvascular disease. No acute intracranial abnormality. Electronically Signed   By: Charlett Nose M.D.   On: October 26, 2022 17:27   DG Foot Complete Left  Result Date: Oct 26, 2022 CLINICAL DATA:  ulceration left little toe EXAM: LEFT FOOT - COMPLETE 3+ VIEW COMPARISON:  None Available. FINDINGS: Evaluation is limited by osteopenia. Query cortical offset of the head of the fifth metatarsal. Otherwise no definitive acute fracture or dislocation. Degenerative changes throughout the toes. No area of erosion or osseous destruction. No unexpected radiopaque foreign body. Soft tissue edema. Vascular  calcifications. IMPRESSION: 1. Query cortical offset of the head of the fifth metatarsal. This may reflect a minimally displaced fracture. Recommend correlation with point tenderness. 2.  No definitive radiographic evidence of osteomyelitis. Electronically Signed   By: Meda Klinefelter M.D.   On: Oct 26, 2022 13:23   DG Chest Port 1 View  Result Date: 10-26-2022 CLINICAL DATA:  Questionable sepsis - evaluate for abnormality EXAM: PORTABLE CHEST 1 VIEW COMPARISON:  Feb 10, 2017 FINDINGS: Evaluation is limited by patient positioning. Incomplete assessment of the LEFT apex laterally. The cardiomediastinal silhouette is grossly similar in contour given patient rotation. No pleural effusion. No pneumothorax. No  acute pleuroparenchymal abnormality. Bilateral soft tissue calcifications of the arms IMPRESSION: 1. No acute cardiopulmonary abnormality within the limitations of this rotated exam. If there is clinical concern for mediastinal pathology, recommend repeat PA and lateral chest radiograph. 2. Bilateral tissue calcifications of the arms, possibly sequela of prior trauma. Recommend correlation with physical exam. Electronically Signed   By: Meda Klinefelter M.D.   On: 10-20-22 13:18    Microbiology: No results found for this or any previous visit (from the past 240 hour(s)).   Labs: Basic Metabolic Panel: No results for input(s): "NA", "K", "CL", "CO2", "GLUCOSE", "BUN", "CREATININE", "CALCIUM", "MG", "PHOS" in the last 168 hours. Liver Function Tests: No results for input(s): "AST", "ALT", "ALKPHOS", "BILITOT", "PROT", "ALBUMIN" in the last 168 hours. No results for input(s): "LIPASE", "AMYLASE" in the last 168 hours. No results for input(s): "AMMONIA" in the last 168 hours. CBC: No results for input(s): "WBC", "NEUTROABS", "HGB", "HCT", "MCV", "PLT" in the last 168 hours. Cardiac Enzymes: No results for input(s): "CKTOTAL", "CKMB", "CKMBINDEX", "TROPONINI" in the last 168 hours. D-Dimer No  results for input(s): "DDIMER" in the last 72 hours. BNP: Invalid input(s): "POCBNP" CBG: No results for input(s): "GLUCAP" in the last 168 hours. Anemia work up No results for input(s): "VITAMINB12", "FOLATE", "FERRITIN", "TIBC", "IRON", "RETICCTPCT" in the last 72 hours. Urinalysis    Component Value Date/Time   COLORURINE YELLOW 10/20/2022 1233   APPEARANCEUR CLEAR 20-Oct-2022 1233   LABSPEC 1.017 10/20/2022 1233   PHURINE 6.0 2022/10/20 1233   GLUCOSEU NEGATIVE October 20, 2022 1233   HGBUR NEGATIVE 10-20-2022 1233   BILIRUBINUR NEGATIVE 10-20-2022 1233   KETONESUR 5 (A) 2022-10-20 1233   PROTEINUR NEGATIVE 10-20-22 1233   UROBILINOGEN 0.2 09/15/2013 1605   NITRITE NEGATIVE 10/20/2022 1233   LEUKOCYTESUR NEGATIVE 10-20-2022 1233   Sepsis Labs No results for input(s): "WBC" in the last 168 hours.  Invalid input(s): "PROCALCITONIN", "LACTICIDVEN"     SIGNED:  Burnadette Pop, MD  [facility] 11/07/2022, 1:04 PM Pager [phone]  If 7PM-7AM, please contact night-coverage [email or web address] Password [email or web address]
